# Patient Record
Sex: Male | Born: 1955
Health system: Southern US, Community
[De-identification: ages and names within clinical notes are randomized; demographics above are authoritative.]

## PROBLEM LIST (undated history)

## (undated) DIAGNOSIS — Z87828 Personal history of other (healed) physical injury and trauma: Secondary | ICD-10-CM

## (undated) DIAGNOSIS — E611 Iron deficiency: Secondary | ICD-10-CM

## (undated) DIAGNOSIS — Z9289 Personal history of other medical treatment: Secondary | ICD-10-CM

## (undated) DIAGNOSIS — D649 Anemia, unspecified: Secondary | ICD-10-CM

## (undated) DIAGNOSIS — H548 Legal blindness, as defined in USA: Secondary | ICD-10-CM

## (undated) DIAGNOSIS — I10 Essential (primary) hypertension: Secondary | ICD-10-CM

## (undated) DIAGNOSIS — Z86718 Personal history of other venous thrombosis and embolism: Secondary | ICD-10-CM

## (undated) DIAGNOSIS — M1990 Unspecified osteoarthritis, unspecified site: Secondary | ICD-10-CM

## (undated) DIAGNOSIS — H4032X0 Glaucoma secondary to eye trauma, left eye, stage unspecified: Secondary | ICD-10-CM

## (undated) DIAGNOSIS — Z87898 Personal history of other specified conditions: Secondary | ICD-10-CM

## (undated) DIAGNOSIS — Z973 Presence of spectacles and contact lenses: Secondary | ICD-10-CM

## (undated) DIAGNOSIS — Q211 Atrial septal defect: Secondary | ICD-10-CM

## (undated) DIAGNOSIS — Q2112 Patent foramen ovale: Secondary | ICD-10-CM

## (undated) HISTORY — PX: OTHER SURGICAL HISTORY: SHX169

## (undated) HISTORY — PX: JOINT REPLACEMENT: SHX530

## (undated) HISTORY — PX: SUPERFICIAL KERATECTOMY: SHX2459

## (undated) HISTORY — PX: TEE WITHOUT CARDIOVERSION: SHX5443

---

## 1996-03-07 HISTORY — PX: ARTHROSCOPIC REPAIR ACL: SUR80

## 1998-01-08 ENCOUNTER — Emergency Department (HOSPITAL_COMMUNITY): Admission: EM | Admit: 1998-01-08 | Discharge: 1998-01-08 | Payer: Self-pay | Admitting: Emergency Medicine

## 1999-06-08 ENCOUNTER — Encounter: Payer: Self-pay | Admitting: Orthopedic Surgery

## 1999-06-08 ENCOUNTER — Ambulatory Visit (HOSPITAL_COMMUNITY): Admission: RE | Admit: 1999-06-08 | Discharge: 1999-06-08 | Payer: Self-pay | Admitting: Orthopedic Surgery

## 1999-07-22 ENCOUNTER — Observation Stay (HOSPITAL_COMMUNITY): Admission: RE | Admit: 1999-07-22 | Discharge: 1999-07-23 | Payer: Self-pay | Admitting: Orthopedic Surgery

## 2001-03-25 ENCOUNTER — Emergency Department (HOSPITAL_COMMUNITY): Admission: EM | Admit: 2001-03-25 | Discharge: 2001-03-25 | Payer: Self-pay | Admitting: *Deleted

## 2001-03-26 ENCOUNTER — Emergency Department (HOSPITAL_COMMUNITY): Admission: EM | Admit: 2001-03-26 | Discharge: 2001-03-26 | Payer: Self-pay | Admitting: Emergency Medicine

## 2001-03-30 ENCOUNTER — Emergency Department (HOSPITAL_COMMUNITY): Admission: EM | Admit: 2001-03-30 | Discharge: 2001-03-30 | Payer: Self-pay | Admitting: Emergency Medicine

## 2001-08-18 ENCOUNTER — Encounter: Payer: Self-pay | Admitting: Orthopedic Surgery

## 2001-08-18 ENCOUNTER — Encounter: Payer: Self-pay | Admitting: Emergency Medicine

## 2001-08-19 ENCOUNTER — Inpatient Hospital Stay (HOSPITAL_COMMUNITY): Admission: RE | Admit: 2001-08-19 | Discharge: 2001-08-21 | Payer: Self-pay | Admitting: Ophthalmology

## 2001-08-20 ENCOUNTER — Encounter: Payer: Self-pay | Admitting: Ophthalmology

## 2001-09-04 ENCOUNTER — Emergency Department (HOSPITAL_COMMUNITY): Admission: EM | Admit: 2001-09-04 | Discharge: 2001-09-04 | Payer: Self-pay | Admitting: Emergency Medicine

## 2007-10-16 ENCOUNTER — Ambulatory Visit (HOSPITAL_COMMUNITY): Admission: RE | Admit: 2007-10-16 | Discharge: 2007-10-16 | Payer: Self-pay | Admitting: Gastroenterology

## 2007-10-24 ENCOUNTER — Ambulatory Visit (HOSPITAL_COMMUNITY): Admission: RE | Admit: 2007-10-24 | Discharge: 2007-10-24 | Payer: Self-pay | Admitting: Gastroenterology

## 2007-10-30 ENCOUNTER — Ambulatory Visit (HOSPITAL_COMMUNITY): Admission: RE | Admit: 2007-10-30 | Discharge: 2007-10-30 | Payer: Self-pay | Admitting: Gastroenterology

## 2009-01-22 ENCOUNTER — Ambulatory Visit (HOSPITAL_COMMUNITY): Admission: RE | Admit: 2009-01-22 | Discharge: 2009-01-22 | Payer: Self-pay | Admitting: Family Medicine

## 2009-04-30 ENCOUNTER — Telehealth (INDEPENDENT_AMBULATORY_CARE_PROVIDER_SITE_OTHER): Payer: Self-pay | Admitting: *Deleted

## 2009-04-30 ENCOUNTER — Encounter: Payer: Self-pay | Admitting: Internal Medicine

## 2009-05-11 ENCOUNTER — Ambulatory Visit: Payer: Self-pay | Admitting: Internal Medicine

## 2009-05-11 ENCOUNTER — Encounter: Payer: Self-pay | Admitting: Internal Medicine

## 2009-05-11 ENCOUNTER — Ambulatory Visit (HOSPITAL_COMMUNITY): Admission: RE | Admit: 2009-05-11 | Discharge: 2009-05-11 | Payer: Self-pay | Admitting: Internal Medicine

## 2010-01-29 ENCOUNTER — Emergency Department (HOSPITAL_COMMUNITY)
Admission: EM | Admit: 2010-01-29 | Discharge: 2010-01-29 | Payer: Self-pay | Source: Home / Self Care | Admitting: Family Medicine

## 2010-03-29 ENCOUNTER — Encounter: Payer: Self-pay | Admitting: Gastroenterology

## 2010-04-06 NOTE — Progress Notes (Signed)
Summary: TEE  Phone Note From Other Clinic   Caller: Referral Coordinator Summary of Call: Please schedule for TEE per Dr Anne Hahn. Notes to come around from Med [facility] Initial call taken by: Edman Circle,  April 30, 2009 4:12 PM  Follow-up for Phone Call        spoke w/pt he prefers to have done on Mon or Tue, will call tom and sch and pt back w/details Meredith Staggers, RN  April 30, 2009 5:25 PM  04/30/09-10am--called pt to schedule TEE--left mess to call back--n terbeck  returning call, please call [phone] or [phone], Migdalia Dk  May 01, 2009 9:51 AM   Additional Follow-up for Phone Call Additional follow up Details #1::        05/01/09 11:30 am--spoke with pt and gave him instructions for TEE sched for 05/11/09 at 11am--pt asked a few questions which i answered to his satisfaction, i also sent him instructions for procedure and pamplet about TEE--NT Additional Follow-up by: Ledon Snare, RN,  May 01, 2009 11:34 AM    7

## 2010-04-06 NOTE — Consult Note (Signed)
Summary: [facility] Referral Form  [facility] Referral Form   Imported By: Roderic Ovens 05/15/2009 16:18:49  _____________________________________________________________________  External Attachment:    Type:   Image     Comment:   External Document

## 2010-07-20 NOTE — Op Note (Signed)
NAME:  Juan David, Juan David               ACCOUNT NO.:  [id number]   MEDICAL RECORD NO.:  [id number]          PATIENT TYPE:  AMB   LOCATION:  ENDO                         FACILITY:  [facility]   PHYSICIAN:  Graylin Shiver, M.D.   DATE OF BIRTH:  Apr 12, 1955   DATE OF PROCEDURE:  10/16/2007  DATE OF DISCHARGE:                               OPERATIVE REPORT   INDICATIONS FOR PROCEDURE:  Heme-positive stool and weight loss.   Informed consent was obtained after explanation of the risks of  bleeding, infection, and perforation.   PREMEDICATION:  Fentanyl 100 mcg IV and Versed 10 mg IV.   PROCEDURE:  With the patient in the left lateral decubitus position, a  rectal exam was performed.  No masses were felt.  The Pentax colonoscope  was inserted into the rectum and advanced into the sigmoid colon.  The  sigmoid was extremely tortuous.  The scope was advanced around the  sigmoid and up into the descending colon, then around into the  transverse colon.  The colon was extremely tortuous and I think the  transverse colon was looping down into the lower abdomen area.  I was  unable to straighten the colon.  I moved the patient's position.  I  applied pressure to the abdomen, but could not advance the scope all the  way to the cecum.  No lesions were seen on this examination.  He  tolerated the procedure well without complications.   IMPRESSION:  Incomplete colonoscopy to transverse colon.   PLAN:  Air contrast barium enema to evaluate the rest of the colon.           ______________________________  Graylin Shiver, M.D.     SFG/MEDQ  D:  10/16/2007  T:  10/17/2007  Job:  [id number]

## 2010-07-23 NOTE — Discharge Summary (Signed)
[place]. [facility]  Patient:    Juan David, Juan David Visit Number: [id number] MRN: [id number]          Service Type: EMS Location: ED Attending Physician:  Osvaldo Human Dictated by:   Guadelupe Sabin, M.D. Admit Date:  09/04/2001 Discharge Date: 09/04/2001   CC:         Aura Camps, M.D.   Discharge Summary  DATE OF BIRTH: 02/18/1956  This was an emergency outpatient admission of this 55 year old black male admitted with a ruptured globe of the left eye.  HISTORY OF PRESENT ILLNESS: This patient while mowing the lawn was struck in the left eye with a metal fragment. The patient was seen in the emergency room at [facility] by Dr. Aura Camps and noted to have a corneal laceration, prolapsed uveal tissue, traumatic cataract and on CAT scan an intraocular presumed metallic foreign body.  The patient warranted emergency surgery and the patient was transferred to my care at the [facility]. This diagnosis was confirmed and arrangements were made for his initial emergency surgery.  HOSPITAL COURSE: The patient therefore was taken to the operating room where under the ocular microscope the penetrating injury with uveal prolapse was inspected.  There was a J shaped laceration of the cornea with prolapsed iris tissue.  The operative procedure consisted of sweeping the prolapsed material from the incision site and closing the corneal incision with multiple 10-0 nylon sutures.  It was anticipated that the patient would require further complicated vitrectomy surgery, but it was felt important to immediately close the globe to allow and to reposit the prolapse uveal tissue.  The patient was given intravenous and intraocular antibiotics and topical antibiotics.  By the following morning the patient was alert and had much less pain in the operated eye.  A CAT scan was repeated showing the rather large foreign body felt to  be on the retinal surface or penetrating into the retina but not through the eye. It was therefore elected to proceed with a second operation on August 20, 2001 consisting of a pars plana vitrectomy with lensectomy of the traumatic cataract which obscured the fundus visualization. The eye itself was filled with a white fibrinous material felt to be either inflammatory or infectious from the vitreous.  Extremely  poor visualization was encountered due to the corneal laceration which leaked slightly and the pupil which slowly enlarged after the traumatic cataract had been mostly removed.  The vitrectomy was carried down closer to the retinal surface but not onto the retinal surface and the foreign body could not be visualized.  The electromagnet was placed on the cornea and there was no movement of a foreign body.  It was felt that rather than cause further injury to the retina that the case should be closed and at a later day another attempt made to remove the foreign body.  The patient tolerated the second procedure and it was felt that the patient was feeling well and could be discharged home for 24 hours.  He was then to be transferred to the [facility] for further evaluation at that medical center and probable additional surgery.  The patient was given a printed list of discharge instructions on the care and use of the operated eye.  DISCHARGE OCULAR MEDICATIONS INCLUDED: 1. Pred Forte ophthalmic solution. 2. Scopolamine ophthalmic solution four times a day. 3. Antibiotic topical medication of Ocuflox ophthalmic solution one drop every  hour.  ACTIVITY:  The patient was told to limit his activity.  FOLLOW-UP: He was then referred to the [facility], Dr. Pauline Aus Slusher, head of the department of Ophthalmology.  Dr. Harmon Pier agreed to accept the patient for further evaluation and/or additional surgery.  The patient and his family had a clear understanding  of the grave prognosis of this severe penetrating injury of the left eye.  They anticipated that further surgical procedures would be necessary carrying a very guarded outlook.  DISCHARGE DIAGNOSIS: Ruptured glove, penetrating injury, left eye with uveal prolapse, traumatic cataract, intraocular metallic foreign body, corneal laceration. Dictated by:   Guadelupe Sabin, M.D. Attending Physician:  Osvaldo Human DD:  09/09/01 TD:  09/11/01 Job: [id number]

## 2010-07-23 NOTE — H&P (Signed)
[place]. [facility]  Patient:    Juan, David Visit Number: [id number] MRN: [id number]          Service Type: EMS Location: ED Attending Physician:  Donnetta Hutching Dictated by:   Guadelupe Sabin, M.D. Admit Date:  08/18/2001 Discharge Date: 08/18/2001   CC:         [name], M.D.  Aura Camps, M.D.   History and Physical  This was an emergency outpatient admission of this 55 year old black male admitted with a traumatic ruptured left eye.  HISTORY OF PRESENT ILLNESS:  This patient sustained a perforating injury of the left eye while mowing his lawn at home.  The patient was taken to the [facility] Emergency Room and was seen by a Dr. Aura Camps.  Dr. Karleen Hampshire noted a corneal laceration, prolapsed uveal tissue, traumatic cataract, and on CAT scan, an intraocular foreign body was seen.  It was felt that the patient warranted emergency surgery and the patient was transferred to the operating room at [facility].  This diagnosis was confirmed and arrangements made for his emergency surgery.  PAST MEDICAL HISTORY:  The patient is under the care of Dr. [name] in [place].  The patient has sustained an infection of his thumb and has been on Keflex antibiotic.  The thumb has cleared significantly over the past two weeks and there is no purulent discharge present.  REVIEW OF SYSTEMS:  The patient states he is in excellent general health.  He denies any specific cardiorespiratory complaints and/or cardiorespiratory disease.  PHYSICAL EXAMINATION:  VITAL SIGNS:  As recorded on admission.  GENERAL APPEARANCE:  The patient is a healthy, well-nourished, well-developed black male in acute ocular distress.  HEENT:  Eyes:  ?Light perception left eye.  There is edema of the left upper and lower lid with mucoid discharge.  The cornea is hazy and a J-shaped corneal laceration present, measuring 4-6 mm in length.  Prolapsed  uveal tissue is present and iris.  The central anterior chamber is deep on the temporal side, the laceration on the nasal side.  Traumatic opaque cataract is present.  No fundus view is obtained with indirect ophthalmoscopy.  CHEST:  Lungs clear to percussion and auscultation.  HEART:  Normal sinus rhythm.  No cardiomegaly, no murmurs.  ABDOMEN:  Negative.  EXTREMITIES:  There is a laceration and defect in the thumb of the left hand. No purulent drainage is present in the lesion.  The laceration-infection appears dry.  ADMISSION DIAGNOSES: 1. Perforating injury with prolapsed uveal tissue, left eye. 2. Probable intraocular foreign body, left eye.  SURGICAL PLAN:  Repair of corneal laceration and prolapsed uveal tissue.  The patient will require a more extensive vitrectomy with attempted removal of the intraocular foreign body as a second stage.  The patient and his family have been told the prognosis for retention of vision and possibly even retention of the eye is very serious.  Understanding the gravity of the situation, they have elected to proceed with surgery.  They understand that multiple surgical procedures will be required. Dictated by:   Guadelupe Sabin, M.D. Attending Physician:  Donnetta Hutching DD:  08/18/01 TD:  08/20/01 Job: [id number]

## 2010-07-23 NOTE — Op Note (Signed)
[place]  Patient:    Juan David, Juan David Visit Number: [id number] MRN: [id number]          Service Type: MED Location: [phone] Attending Physician:  Ivor Messier Dictated by:   Guadelupe Sabin, M.D. Proc. Date: 08/18/01 Admit Date:  08/18/2001 Discharge Date: 08/21/2001   CC:         [name], M.D.  Aura Camps, M.D.   Operative Report  PREOPERATIVE DIAGNOSES: 1. Ruptured globe. 2. Perforating injury, left eye, with prolapsed uveal tissue. 3. Traumatic cataract. 4. Probable intraocular foreign body.  POSTOPERATIVE DIAGNOSES: 1. Ruptured globe. 2. Perforating injury, left eye, with prolapsed uveal tissue. 3. Traumatic cataract. 4. Probable intraocular foreign body.  OPERATION:  Repair of corneal laceration, reposition of prolapsed uveal tissue, intraocular antibiotic installation.  SURGEON:  Guadelupe Sabin, M.D.  ASSISTANT:  Nurse.  ANESTHESIA:  General.  Ophthalmoscopy as previously described.  OPERATIVE PROCEDURE:  After the patient was prepped and draped, a lid speculum was inserted in the left eye.  The eye was turned downward and a conjunctival 6-0 silk suture placed.  The ocular microscope was aligned.  Examination of the eye revealed a J-shaped corneal laceration of the nasal cornea.  The central cornea was hazy and edematous.  There appeared to be prolapsed uveal tissue and a vitreous.  The laceration was irrigated and a conjunctival culture obtained.  An incision was made into the temporal cornea with a 19-gauge MVR blade.  Using the cyclodialysis spatula, the anterior chamber was swept after being deepened with air.  The prolapsed uveal tissue was reposited.  It was then elected to suture the corneal laceration.  Five 10-0 interrupted nylon sutures were used to close the laceration.  The anterior chamber shallowed intermittently and was reformed with air and epinephrine was injected to  dilate the pupil.  This was unsuccessful in dilating the pupil. The anterior chamber, however, deepened and the initial sweeping of the prolapsed uveal tissue was reposited and the air bubble extended to the complete limbus.  This indicated that there was no incarceration of uveal or vitreous tissue centrally.  It was then elected to close.  The incision was inspected and felt to be closed and fluid and air tight.  A small incision was then made in the conjunctiva at the 2 oclock position and extended backward 3 mm.  It was elected to inject amikacin, vancomycin, and dexamethasone in the vitreous cavity in dosage consistent with the national endophthalmitis study. One tenth cc of each of the medications was injected with a 30 gauge needle into the anterior vitreous.  The conjunctival incision was then closed with a single 7-0 Vicryl suture.  The subconjunctival injections of vancomycin and Garamycin were injected.  Maxitrol and atropine ointment were instilled in the conjunctival cul-de-sac.  A light patch and protector shield were applied. Duration of procedure:  1-1/4 hours.  The patient tolerated the procedure well in general, left the operating room for the recovery room in good condition.  It appears that this patient will require subsequent vitreal retinal surgery to remove the possible intraocular foreign body.  The cornea was too hazy at this point in time to do vitrectomy surgery.  It was thus elected to proceed with closure of the primary corneal incision and reposition of the uveal tissue. Dictated by:   Guadelupe Sabin, M.D. Attending Physician:  Ivor Messier DD:  08/18/01 TD:  08/20/01 Job: [id number]

## 2013-09-20 ENCOUNTER — Other Ambulatory Visit: Payer: Self-pay | Admitting: Radiology

## 2013-10-28 ENCOUNTER — Other Ambulatory Visit: Payer: Self-pay | Admitting: *Deleted

## 2014-11-24 ENCOUNTER — Emergency Department (HOSPITAL_COMMUNITY)
Admission: EM | Admit: 2014-11-24 | Discharge: 2014-11-24 | Disposition: A | Discharge status: Home / Self Care | Payer: 59 | Attending: Emergency Medicine | Admitting: Emergency Medicine

## 2014-11-24 ENCOUNTER — Emergency Department (HOSPITAL_COMMUNITY): Payer: 59

## 2014-11-24 ENCOUNTER — Encounter (HOSPITAL_COMMUNITY): Payer: Self-pay | Admitting: Emergency Medicine

## 2014-11-24 DIAGNOSIS — S99911A Unspecified injury of right ankle, initial encounter: Secondary | ICD-10-CM | POA: Diagnosis present

## 2014-11-24 DIAGNOSIS — Y998 Other external cause status: Secondary | ICD-10-CM | POA: Insufficient documentation

## 2014-11-24 DIAGNOSIS — M25571 Pain in right ankle and joints of right foot: Secondary | ICD-10-CM

## 2014-11-24 DIAGNOSIS — Z720 Tobacco use: Secondary | ICD-10-CM | POA: Insufficient documentation

## 2014-11-24 DIAGNOSIS — W1842XA Slipping, tripping and stumbling without falling due to stepping into hole or opening, initial encounter: Secondary | ICD-10-CM | POA: Insufficient documentation

## 2014-11-24 DIAGNOSIS — Z79899 Other long term (current) drug therapy: Secondary | ICD-10-CM | POA: Insufficient documentation

## 2014-11-24 DIAGNOSIS — Z7982 Long term (current) use of aspirin: Secondary | ICD-10-CM | POA: Insufficient documentation

## 2014-11-24 DIAGNOSIS — Y9389 Activity, other specified: Secondary | ICD-10-CM | POA: Diagnosis not present

## 2014-11-24 DIAGNOSIS — Y9289 Other specified places as the place of occurrence of the external cause: Secondary | ICD-10-CM | POA: Insufficient documentation

## 2014-11-24 DIAGNOSIS — S93601A Unspecified sprain of right foot, initial encounter: Secondary | ICD-10-CM | POA: Insufficient documentation

## 2014-11-24 MED ORDER — NAPROXEN 250 MG PO TABS: 250.0000 mg | ORAL_TABLET | Freq: Two times a day (BID) | ORAL | Status: DC

## 2014-11-24 NOTE — ED Notes (Signed)
Pt c/o right lateral foot pain onset Saturday after twisting ankle in a hole.

## 2014-11-24 NOTE — ED Provider Notes (Signed)
CSN: [id number]     Arrival date & time 11/24/14  1415 History  This chart was scribed for non-physician practitioner, Waynetta Pean, PA-C working with Leonard Schwartz, MD by Rayna Sexton, ED scribe. This patient was seen in room WTR5/WTR5 and the patient's care was started at 5:47 PM.   Chief Complaint  Patient presents with  . Ankle Pain   The history is provided by the patient. No language interpreter was used.    HPI Comments: Juan David is a 59 y.o. male who presents to the Emergency Department complaining of constant, mild, right lateral foot pain with onset 2 days ago. Pt notes that he stepped into a hole and twisted the affected foot resulting in his current pain. He notes a worsening of his pain with ambulation and rates that pain as 7/10. He reports he has no pain without walking. He denies any radiation of his pain or any associated ankle pain. Pt denies any numbness, weakness, tingling, fevers, chills or any recent illnesses.   History reviewed. No pertinent past medical history. Past Surgical History  Procedure Laterality Date  . Eye surgery    . Arthroscopic repair acl     No family history on file. Social History  Substance Use Topics  . Smoking status: Current Every Day Smoker    Types: Cigars  . Smokeless tobacco: None  . Alcohol Use: Yes     Comment: occasionally    Review of Systems  Constitutional: Negative for fever and chills.  Musculoskeletal: Positive for myalgias and joint swelling.  Skin: Negative for wound.  Neurological: Negative for weakness and numbness.   Allergies  Review of patient's allergies indicates no known allergies.  Home Medications   Prior to Admission medications   Medication Sig Start Date End Date Taking? Authorizing Provider  aspirin 325 MG tablet Take 325 mg by mouth daily.   Yes Historical Provider, MD  hydrochlorothiazide (HYDRODIURIL) 50 MG tablet Take 25 mg by mouth daily.  10/27/14  Yes Historical Provider, MD  IRON  PO Take 4 tablets by mouth daily.   Yes Historical Provider, MD  naproxen (NAPROSYN) 250 MG tablet Take 1 tablet (250 mg total) by mouth 2 (two) times daily with a meal. 11/24/14   Waynetta Pean, PA-C  timolol (TIMOPTIC) 0.5 % ophthalmic solution Place 1 drop into the left eye daily.  11/19/14  Yes Historical Provider, MD   BP 163/96 mmHg  Pulse 72  Temp(Src) 97.9 F (36.6 C) (Oral)  Resp 18  SpO2 100% Physical Exam  Constitutional: He appears well-developed and well-nourished. No distress.  Non-toxic appearing  HENT:  Head: Normocephalic and atraumatic.  Eyes: Conjunctivae are normal. Pupils are equal, round, and reactive to light. Right eye exhibits no discharge. Left eye exhibits no discharge.  Neck: Neck supple.  Cardiovascular: Normal rate, regular rhythm, normal heart sounds and intact distal pulses.   Pulses:      Dorsalis pedis pulses are 2+ on the right side, and 2+ on the left side.       Posterior tibial pulses are 2+ on the right side, and 2+ on the left side.  Good capillary refill to his right distal toes.  Pulmonary/Chest: Effort normal and breath sounds normal. No respiratory distress.  Abdominal: Soft. There is no tenderness.  Musculoskeletal: Normal range of motion. He exhibits edema and tenderness.  Mild edema to lateral aspect of right foot. No deformity. No right ankle TTP. No ecchymosis. Good range of motion of his right ankle  and foot.  No calf edema or tenderness.  Lymphadenopathy:    He has no cervical adenopathy.  Neurological: He is alert. Coordination normal.  Sensation is intact to his bilateral feet.  Skin: Skin is warm and dry. No rash noted. He is not diaphoretic. No erythema. No pallor.  Psychiatric: He has a normal mood and affect. His behavior is normal.  Nursing note and vitals reviewed.  ED Course  Procedures  DIAGNOSTIC STUDIES: Oxygen Saturation is 100% on RA, normal by my interpretation.    COORDINATION OF CARE: 5:52 PM Discussed  treatment plan with pt at bedside including a post op shoe and at home recommendations for rehabilitation. Pt agreed to plan.  Labs Review Labs Reviewed - No data to display  Imaging Review Dg Ankle Complete Right  11/24/2014   CLINICAL DATA:  Pt states he was at his sisters house Saturday and stepped in a hole causing him to roll his right ankle, pain  EXAM: RIGHT ANKLE - COMPLETE 3+ VIEW  COMPARISON:  None.  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 Mortise is intact. No fracture identified. Small joint effusion. Minimal soft tissue swelling. Mild tibiotalar arthritis.  IMPRESSION: Degenerative changes and evidence of ankle sprain.   Electronically Signed   By: Skipper Cliche M.D.   On: 11/24/2014 15:06   Dg Foot Complete Right  11/24/2014   CLINICAL DATA:  Stepped in hole and rolled right ankle with pain and swelling laterally.  EXAM: RIGHT FOOT COMPLETE - 3+ VIEW  COMPARISON:  None.  FINDINGS: Mild degenerative change over the first MTP joint. Mild age in change of the tibiotalar joint. No acute fracture or dislocation.  IMPRESSION: No acute findings.   Electronically Signed   By: Marin Olp M.D.   On: 11/24/2014 15:07     EKG Interpretation None      Filed Vitals:   11/24/14 1445 11/24/14 1815  BP: 156/88 163/96  Pulse: 82 72  Temp: 98.2 F (36.8 C) 97.9 F (36.6 C)  TempSrc: Oral Oral  Resp: 18 18  SpO2: 100% 100%     MDM   Meds given in ED:  Medications - No data to display  Discharge Medication List as of 11/24/2014  5:54 PM    START taking these medications   Details  naproxen (NAPROSYN) 250 MG tablet Take 1 tablet (250 mg total) by mouth 2 (two) times daily with a meal., Starting 11/24/2014, Until Discontinued, Print        Final diagnoses:  Foot sprain, right, initial encounter  Ankle pain, right   This is a 59 year old male who presents to the emergency department complaining of right foot pain after he stepped into a  hole 2 days ago. He reports this caused him to roll his ankle. He denies any ankle pain to me. On exam the patient has mild edema and tenderness to the lateral aspect of his right foot. No deformity noted. He is neurovascularly intact. Right foot x-rays unremarkable. Right ankle x-ray shows degenerative changes and evidence of ankle sprain. The patient has no ankle tenderness on exam and complains of no ankle pain to me. He reports he uses an Ace wrap previously and this did not help. No clinical evidence of ankle sprain on physical exam. Will place the patient in a postop shoe due to his foot pain and have him follow-up with primary care and orthopedic surgery. I advised him if his pain persists he may need repeat  x-rays or additional imaging of his feet. I advised the patient to follow-up with their primary care provider this week. I advised the patient to return to the emergency department with new or worsening symptoms or new concerns. The patient verbalized understanding and agreement with plan.    I personally performed the services described in this documentation, which was scribed in my presence. The recorded information has been reviewed and is accurate.      Waynetta Pean, PA-C 11/24/14 1924  Leonard Schwartz, MD 11/24/14 [phone]

## 2014-11-24 NOTE — Discharge Instructions (Signed)
Foot Sprain The muscles and cord like structures which attach muscle to bone (tendons) that surround the feet are made up of units. A foot sprain can occur at the weakest spot in any of these units. This condition is most often caused by injury to or overuse of the foot, as from playing contact sports, or aggravating a previous injury, or from poor conditioning, or obesity. SYMPTOMS  Pain with movement of the foot.  Tenderness and swelling at the injury site.  Loss of strength is present in moderate or severe sprains. THE THREE GRADES OR SEVERITY OF FOOT SPRAIN ARE:  Mild (Grade I): Slightly pulled muscle without tearing of muscle or tendon fibers or loss of strength.  Moderate (Grade II): Tearing of fibers in a muscle, tendon, or at the attachment to bone, with small decrease in strength.  Severe (Grade III): Rupture of the muscle-tendon-bone attachment, with separation of fibers. Severe sprain requires surgical repair. Often repeating (chronic) sprains are caused by overuse. Sudden (acute) sprains are caused by direct injury or over-use. DIAGNOSIS  Diagnosis of this condition is usually by your own observation. If problems continue, a caregiver may be required for further evaluation and treatment. X-rays may be required to make sure there are not breaks in the bones (fractures) present. Continued problems may require physical therapy for treatment. PREVENTION  Use strength and conditioning exercises appropriate for your sport.  Warm up properly prior to working out.  Use athletic shoes that are made for the sport you are participating in.  Allow adequate time for healing. Early return to activities makes repeat injury more likely, and can lead to an unstable arthritic foot that can result in prolonged disability. Mild sprains generally heal in 3 to 10 days, with moderate and severe sprains taking 2 to 10 weeks. Your caregiver can help you determine the proper time required for  healing. HOME CARE INSTRUCTIONS   Apply ice to the injury for 15-20 minutes, 03-04 times per day. Put the ice in a plastic bag and place a towel between the bag of ice and your skin.  An elastic wrap (like an Ace bandage) may be used to keep swelling down.  Keep foot above the level of the heart, or at least raised on a footstool, when swelling and pain are present.  Try to avoid use other than gentle range of motion while the foot is painful. Do not resume use until instructed by your caregiver. Then begin use gradually, not increasing use to the point of pain. If pain does develop, decrease use and continue the above measures, gradually increasing activities that do not cause discomfort, until you gradually achieve normal use.  Use crutches if and as instructed, and for the length of time instructed.  Keep injured foot and ankle wrapped between treatments.  Massage foot and ankle for comfort and to keep swelling down. Massage from the toes up towards the knee.  Only take over-the-counter or prescription medicines for pain, discomfort, or fever as directed by your caregiver. SEEK IMMEDIATE MEDICAL CARE IF:   Your pain and swelling increase, or pain is not controlled with medications.  You have loss of feeling in your foot or your foot turns cold or blue.  You develop new, unexplained symptoms, or an increase of the symptoms that brought you to your caregiver. MAKE SURE YOU:   Understand these instructions.  Will watch your condition.  Will get help right away if you are not doing well or get worse. Document Released:  08/13/2001 Document Revised: 05/16/2011 Document Reviewed: 10/11/2007 ExitCare Patient Information 2015 [place], [place]. This information is not intended to replace advice given to you by your health care provider. Make sure you discuss any questions you have with your health care provider.  Ankle Pain Ankle pain is a common symptom. The bones, cartilage, tendons,  and muscles of the ankle joint perform a lot of work each day. The ankle joint holds your body weight and allows you to move around. Ankle pain can occur on either side or back of 1 or both ankles. Ankle pain may be sharp and burning or dull and aching. There may be tenderness, stiffness, redness, or warmth around the ankle. The pain occurs more often when a person walks or puts pressure on the ankle. CAUSES  There are many reasons ankle pain can develop. It is important to work with your caregiver to identify the cause since many conditions can impact the bones, cartilage, muscles, and tendons. Causes for ankle pain include:  Injury, including a break (fracture), sprain, or strain often due to a fall, sports, or a high-impact activity.  Swelling (inflammation) of a tendon (tendonitis).  Achilles tendon rupture.  Ankle instability after repeated sprains and strains.  Poor foot alignment.  Pressure on a nerve (tarsal tunnel syndrome).  Arthritis in the ankle or the lining of the ankle.  Crystal formation in the ankle (gout or pseudogout). DIAGNOSIS  A diagnosis is based on your medical history, your symptoms, results of your physical exam, and results of diagnostic tests. Diagnostic tests may include X-ray exams or a computerized magnetic scan (magnetic resonance imaging, MRI). TREATMENT  Treatment will depend on the cause of your ankle pain and may include:  Keeping pressure off the ankle and limiting activities.  Using crutches or other walking support (a cane or brace).  Using rest, ice, compression, and elevation.  Participating in physical therapy or home exercises.  Wearing shoe inserts or special shoes.  Losing weight.  Taking medications to reduce pain or swelling or receiving an injection.  Undergoing surgery. HOME CARE INSTRUCTIONS   Only take over-the-counter or prescription medicines for pain, discomfort, or fever as directed by your caregiver.  Put ice on the  injured area.  Put ice in a plastic bag.  Place a towel between your skin and the bag.  Leave the ice on for 15-20 minutes at a time, 03-04 times a day.  Keep your leg raised (elevated) when possible to lessen swelling.  Avoid activities that cause ankle pain.  Follow specific exercises as directed by your caregiver.  Record how often you have ankle pain, the location of the pain, and what it feels like. This information may be helpful to you and your caregiver.  Ask your caregiver about returning to work or sports and whether you should drive.  Follow up with your caregiver for further examination, therapy, or testing as directed. SEEK MEDICAL CARE IF:   Pain or swelling continues or worsens beyond 1 week.  You have an oral temperature above 102 F (38.9 C).  You are feeling unwell or have chills.  You are having an increasingly difficult time with walking.  You have loss of sensation or other new symptoms.  You have questions or concerns. MAKE SURE YOU:   Understand these instructions.  Will watch your condition.  Will get help right away if you are not doing well or get worse. Document Released: 08/11/2009 Document Revised: 05/16/2011 Document Reviewed: 08/11/2009 ExitCare Patient Information 2015 [place]. This information is not intended to replace advice given to you by your health care provider. Make sure you discuss any questions you have with your health care provider.

## 2014-11-24 NOTE — ED Notes (Signed)
Post op shoe present to rt foot

## 2015-05-04 DIAGNOSIS — Z049 Encounter for examination and observation for unspecified reason: Secondary | ICD-10-CM | POA: Diagnosis not present

## 2015-05-04 DIAGNOSIS — I10 Essential (primary) hypertension: Secondary | ICD-10-CM | POA: Diagnosis not present

## 2015-05-04 DIAGNOSIS — Z0000 Encounter for general adult medical examination without abnormal findings: Secondary | ICD-10-CM | POA: Diagnosis not present

## 2015-05-04 DIAGNOSIS — R7301 Impaired fasting glucose: Secondary | ICD-10-CM | POA: Diagnosis not present

## 2015-05-04 DIAGNOSIS — E0590 Thyrotoxicosis, unspecified without thyrotoxic crisis or storm: Secondary | ICD-10-CM | POA: Diagnosis not present

## 2015-05-08 MED FILL — LOVASTATIN 20 MG TABLET: 20 | 90 days supply | Qty: 90 | Fill #0

## 2015-05-08 MED FILL — HYDROCHLOROTHIAZIDE 50 MG T: 50 | 90 days supply | Qty: 90 | Fill #0

## 2015-08-06 DIAGNOSIS — H18422 Band keratopathy, left eye: Secondary | ICD-10-CM | POA: Diagnosis not present

## 2015-08-06 DIAGNOSIS — S0532XS Ocular laceration without prolapse or loss of intraocular tissue, left eye, sequela: Secondary | ICD-10-CM | POA: Diagnosis not present

## 2015-08-06 DIAGNOSIS — H4032X4 Glaucoma secondary to eye trauma, left eye, indeterminate stage: Secondary | ICD-10-CM | POA: Diagnosis not present

## 2015-08-06 DIAGNOSIS — H33052 Total retinal detachment, left eye: Secondary | ICD-10-CM | POA: Diagnosis not present

## 2015-08-20 ENCOUNTER — Emergency Department (HOSPITAL_COMMUNITY)
Admission: EM | Admit: 2015-08-20 | Discharge: 2015-08-20 | Disposition: A | Discharge status: Home / Self Care | Payer: 59 | Attending: Emergency Medicine | Admitting: Emergency Medicine

## 2015-08-20 ENCOUNTER — Encounter (HOSPITAL_COMMUNITY): Payer: Self-pay | Admitting: Emergency Medicine

## 2015-08-20 ENCOUNTER — Emergency Department (HOSPITAL_COMMUNITY): Payer: 59

## 2015-08-20 DIAGNOSIS — F17210 Nicotine dependence, cigarettes, uncomplicated: Secondary | ICD-10-CM | POA: Diagnosis not present

## 2015-08-20 DIAGNOSIS — Z79899 Other long term (current) drug therapy: Secondary | ICD-10-CM | POA: Insufficient documentation

## 2015-08-20 DIAGNOSIS — Z7982 Long term (current) use of aspirin: Secondary | ICD-10-CM | POA: Diagnosis not present

## 2015-08-20 DIAGNOSIS — Z791 Long term (current) use of non-steroidal anti-inflammatories (NSAID): Secondary | ICD-10-CM | POA: Diagnosis not present

## 2015-08-20 DIAGNOSIS — M25562 Pain in left knee: Secondary | ICD-10-CM | POA: Insufficient documentation

## 2015-08-20 HISTORY — DX: Essential (primary) hypertension: I10

## 2015-08-20 MED ORDER — NAPROXEN 500 MG PO TABS: 500.0000 mg | ORAL_TABLET | Freq: Two times a day (BID) | ORAL | Status: DC

## 2015-08-20 MED FILL — NAPROXEN 500 MG TABLET: 500 | 15 days supply | Qty: 30 | Fill #0

## 2015-08-20 NOTE — ED Notes (Signed)
C/o left knee pain and swelling x 2 days. No known injury. Tender to medial aspect of left knee.

## 2015-08-20 NOTE — ED Notes (Signed)
Gone to xray,.

## 2015-08-20 NOTE — ED Provider Notes (Signed)
CSN: [id number]     Arrival date & time 08/20/15  1141 History  By signing my name below, I, Juan David, attest that this documentation has been prepared under the direction and in the presence of Hyman Bible, PA-C Electronically Signed: Ladene Artist, ED Scribe 08/20/2015 at 1:11 PM.   Chief Complaint  Patient presents with  . Knee Pain   The history is provided by the patient. No language interpreter was used.   HPI Comments: Juan David is a 60 y.o. male who presents to the Emergency Department complaining of gradually worsening left knee pain onset 2 days ago. Pt denies fall or injury. He reports increased pain with flexion and instability with ambulating up stairs. Pt reports associated symptoms of joint swelling and warmth last night. He has tried ibuprofen with minimal relief. Pt denies redness and any other symptoms.   No past medical history on file. Past Surgical History  Procedure Laterality Date  . Eye surgery    . Arthroscopic repair acl     No family history on file. Social History  Substance Use Topics  . Smoking status: Current Every Day Smoker    Types: Cigars  . Smokeless tobacco: Not on file  . Alcohol Use: Yes     Comment: occasionally    Review of Systems  Musculoskeletal: Positive for joint swelling and arthralgias.  Skin: Negative for color change.  All other systems reviewed and are negative.  Allergies  Review of patient's allergies indicates no known allergies.  Home Medications   Prior to Admission medications   Medication Sig Start Date End Date Taking? Authorizing Provider  aspirin 325 MG tablet Take 325 mg by mouth daily.    Historical Provider, MD  hydrochlorothiazide (HYDRODIURIL) 50 MG tablet Take 25 mg by mouth daily.  10/27/14   Historical Provider, MD  IRON PO Take 4 tablets by mouth daily.    Historical Provider, MD  naproxen (NAPROSYN) 250 MG tablet Take 1 tablet (250 mg total) by mouth 2 (two) times daily with a meal.  11/24/14   Waynetta Pean, PA-C  timolol (TIMOPTIC) 0.5 % ophthalmic solution Place 1 drop into the left eye daily.  11/19/14   Historical Provider, MD   BP 133/80 mmHg  Pulse 68  Temp(Src) 98.3 F (36.8 C) (Oral)  Resp 18  Ht 5' 10.75" (1.797 m)  Wt 145 lb (65.772 kg)  BMI 20.37 kg/m2  SpO2 100% Physical Exam  Constitutional: He is oriented to person, place, and time. He appears well-developed and well-nourished. No distress.  HENT:  Head: Normocephalic and atraumatic.  Eyes: Conjunctivae and EOM are normal.  Neck: Neck supple. No tracheal deviation present.  Cardiovascular: Normal rate and regular rhythm.   Pulmonary/Chest: Effort normal and breath sounds normal. No respiratory distress.  Musculoskeletal: Normal range of motion.  Tenderness to palpation of the medial aspect of the L knee with some associated edema No erythema or warmth Good ROM of left knee No obvious laxity with anterior or posterior drawer  Pain with valgus stress of the knee No pain with varus stress of the knee No tenderness to palpation of the L calf No swelling or erythema of L calf   Neurological: He is alert and oriented to person, place, and time.  Skin: Skin is warm and dry.  Psychiatric: He has a normal mood and affect. His behavior is normal.  Nursing note and vitals reviewed.  ED Course  Procedures (including critical care time) DIAGNOSTIC STUDIES: Oxygen Saturation is  100% on RA, normal by my interpretation.    COORDINATION OF CARE: 12:00 PM-Discussed treatment plan which includes XR with pt at bedside and pt agreed to plan.   Labs Review Labs Reviewed - No data to display  Imaging Review Dg Knee Complete 4 Views Left  08/20/2015  CLINICAL DATA:  Medial left knee pain. EXAM: LEFT KNEE - COMPLETE 4+ VIEW COMPARISON:  01/29/2010 FINDINGS: No evidence of fracture, dislocation, or joint effusion. There are mild osteoarthritic changes of all 3 compartments of the knee joint with tibial spine  spurring, and patellar spurring. There is minimal associated joint space narrowing and subchondral sclerosis of the medial and lateral compartments. Prominent vascular calcifications are noted within the soft tissues. IMPRESSION: No acute fracture or dislocation identified about the left knee. 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 Vascular calcifications. Electronically Signed   By: Fidela Salisbury M.D.   On: 08/20/2015 13:06   I have personally reviewed and evaluated these images and lab results as part of my medical decision-making.   EKG Interpretation None      MDM   Final diagnoses:  None  Patient presents today with left knee pain.  No acute injury or trauma.  No signs of infection on exam.  Xray showing mild OA of all three compartments.  No obvious laxity with anterior or posterior drawer.  Pain with valgus stress of the knee.  Patient ambulating without difficulty.  Patient given knee sleeve and referral to orthopedics.  Stable for discharge.  Return precautions given.    I personally performed the services described in this documentation, which was scribed in my presence. The recorded information has been reviewed and is accurate.    Hyman Bible, PA-C 08/20/15 [place] Liu, MD 08/21/15 [phone]

## 2015-11-02 MED FILL — HYDROCHLOROTHIAZIDE 50 MG T: 50 | 90 days supply | Qty: 90 | Fill #0

## 2015-11-06 MED FILL — TIMOLOL 0.5% EYE DROPS: 0.5 | 90 days supply | Qty: 5 | Fill #0

## 2016-05-06 DIAGNOSIS — Z049 Encounter for examination and observation for unspecified reason: Secondary | ICD-10-CM | POA: Diagnosis not present

## 2016-05-06 DIAGNOSIS — Z1211 Encounter for screening for malignant neoplasm of colon: Secondary | ICD-10-CM | POA: Diagnosis not present

## 2016-05-06 DIAGNOSIS — I10 Essential (primary) hypertension: Secondary | ICD-10-CM | POA: Diagnosis not present

## 2016-05-06 DIAGNOSIS — Z0000 Encounter for general adult medical examination without abnormal findings: Secondary | ICD-10-CM | POA: Diagnosis not present

## 2016-05-06 MED FILL — HYDROCHLOROTHIAZIDE 50 MG T: 50 | 90 days supply | Qty: 45 | Fill #0

## 2016-05-23 DIAGNOSIS — H4032X4 Glaucoma secondary to eye trauma, left eye, indeterminate stage: Secondary | ICD-10-CM | POA: Diagnosis not present

## 2016-05-23 DIAGNOSIS — H18422 Band keratopathy, left eye: Secondary | ICD-10-CM | POA: Diagnosis not present

## 2016-05-23 DIAGNOSIS — S0532XS Ocular laceration without prolapse or loss of intraocular tissue, left eye, sequela: Secondary | ICD-10-CM | POA: Diagnosis not present

## 2016-05-23 MED FILL — PREDNISOLONE AC 1% EYE DROP: 1 | 25 days supply | Qty: 5 | Fill #0

## 2016-05-23 MED FILL — TIMOLOL 0.5% EYE DROPS: 0.5 | 90 days supply | Qty: 5 | Fill #0

## 2016-07-26 ENCOUNTER — Encounter (HOSPITAL_COMMUNITY): Payer: Self-pay | Admitting: *Deleted

## 2016-07-26 ENCOUNTER — Ambulatory Visit (HOSPITAL_COMMUNITY)
Admission: EM | Admit: 2016-07-26 | Discharge: 2016-07-26 | Disposition: A | Discharge status: Home / Self Care | Payer: 59 | Attending: Family Medicine | Admitting: Family Medicine

## 2016-07-26 DIAGNOSIS — K629 Disease of anus and rectum, unspecified: Secondary | ICD-10-CM | POA: Diagnosis not present

## 2016-07-26 DIAGNOSIS — K6289 Other specified diseases of anus and rectum: Secondary | ICD-10-CM

## 2016-07-26 MED ORDER — DOCUSATE SODIUM 100 MG PO CAPS: 100.0000 mg | ORAL_CAPSULE | Freq: Two times a day (BID) | ORAL | 0 refills | Status: DC

## 2016-07-26 NOTE — ED Notes (Signed)
Juan David  From [facility]  Notified     And  Spoke    With  Pt     To  Get      An  appt  With  Pt  asap

## 2016-07-26 NOTE — ED Triage Notes (Signed)
Pt   Reports   Has     Had  Boil   Buttock     X  sev  Months   States  It  Bursts  yest   And  Has  Been    Draining  Since

## 2016-07-26 NOTE — ED Provider Notes (Signed)
CSN: [id number]     Arrival date & time 07/26/16  1314 History   None    Chief Complaint  Patient presents with  . Abscess   (Consider location/radiation/quality/duration/timing/severity/associated sxs/prior Treatment) Patient c/o rectal mass that is draining.  He states this has been present for a month and it started draining yesterday.   The history is provided by the patient.  Abscess  Abscess location: rectum. Size:  1 cm  Abscess quality: redness and weeping   Red streaking: no   Progression:  Worsening Chronicity:  New Relieved by:  Nothing Worsened by:  Nothing Ineffective treatments:  None tried   Past Medical History:  Diagnosis Date  . Glaucoma   . Hypertension    Past Surgical History:  Procedure Laterality Date  . ARTHROSCOPIC REPAIR ACL    . EYE SURGERY     History reviewed. No pertinent family history. Social History  Substance Use Topics  . Smoking status: Current Every Day Smoker    Types: Cigars  . Smokeless tobacco: Not on file  . Alcohol use Yes     Comment: occasionally    Review of Systems  Constitutional: Negative.   HENT: Negative.   Eyes: Negative.   Respiratory: Negative.   Cardiovascular: Negative.   Gastrointestinal: Positive for rectal pain.  Endocrine: Negative.   Genitourinary: Negative.   Musculoskeletal: Negative.   Allergic/Immunologic: Negative.   Neurological: Negative.   Hematological: Negative.   Psychiatric/Behavioral: Negative.     Allergies  Patient has no known allergies.  Home Medications   Prior to Admission medications   Medication Sig Start Date End Date Taking? Authorizing Provider  POTASSIUM CHLORIDE PO Take by mouth.   Yes [provider]  aspirin 325 MG tablet Take 325 mg by mouth daily.    [provider]  docusate sodium (COLACE) 100 MG capsule Take 1 capsule (100 mg total) by mouth every 12 (twelve) hours. 07/26/16   Lysbeth Penner, FNP  hydrochlorothiazide (HYDRODIURIL) 50  MG tablet Take 25 mg by mouth daily.  10/27/14   [provider]  IRON PO Take 4 tablets by mouth daily.    [provider]  naproxen (NAPROSYN) 500 MG tablet Take 1 tablet (500 mg total) by mouth 2 (two) times daily. 08/20/15   Hyman Bible, PA-C  timolol (TIMOPTIC) 0.5 % ophthalmic solution Place 1 drop into the left eye daily.  11/19/14   [provider]   Meds Ordered and Administered this Visit  Medications - No data to display  BP 130/79 (BP Location: Right Arm)   Pulse 74   Temp 99 F (37.2 C) (Oral)   Resp 16   SpO2 95%  No data found.   Physical Exam  Constitutional: He appears well-developed and well-nourished.  HENT:  Head: Normocephalic and atraumatic.  Eyes: Conjunctivae and EOM are normal. Pupils are equal, round, and reactive to light.  Neck: Normal range of motion. Neck supple.  Cardiovascular: Normal rate, regular rhythm and normal heart sounds.   Pulmonary/Chest: Effort normal and breath sounds normal.  Abdominal: Soft. Bowel sounds are normal.  Genitourinary:  Genitourinary Comments: Fungating Rectal mass approx 1 cm diameter with bleeding and with friable bleeding after rectal exam.  Nursing note and vitals reviewed.   Urgent Care Course     Procedures (including critical care time)  Labs Review Labs Reviewed - No data to display  Imaging Review No results found.   Visual Acuity Review  Right Eye Distance:   Left  Eye Distance:   Bilateral Distance:    Right Eye Near:   Left Eye Near:    Bilateral Near:         MDM   1. Rectal mass    Refer to [facility] office and they will get patient in with them in next couple days.  Colace 100mg  one po bid #60      Lysbeth Penner, [place] 07/26/16 1615

## 2016-07-26 NOTE — Discharge Instructions (Signed)
Called and spoke to on call surgeon who recommends going to outpatient office for work up and treatment.   [facility] and they will call with an appointment.

## 2016-08-01 MED FILL — HYDROCHLOROTHIAZIDE 50 MG T: 50 | 90 days supply | Qty: 45 | Fill #1

## 2016-08-02 ENCOUNTER — Other Ambulatory Visit: Payer: Self-pay | Admitting: General Surgery

## 2016-08-02 DIAGNOSIS — K642 Third degree hemorrhoids: Secondary | ICD-10-CM | POA: Diagnosis not present

## 2016-08-02 NOTE — H&P (Signed)
History of Present Illness Leighton Ruff MD; 09/07/5007 2:33 PM) The patient is a 61 year old male who presents with a complaint of Rectal bleeding. 61 year old male who presents today for evaluation of rectal bleeding. He has noticed a mass protruding from his anal canal over the past 7 months. He has to reduce it back into his anal canal or it will bleed. A couple weeks ago he developed acute rectal bleeding and was seen in the emergency department. A mass was palpated on physical exam and he was sent to me for further evaluation. He denies any history of sex a transmitted diseases or anal condyloma.   Past Surgical History Mammie Lorenzo, LPN; 3/81/8299 3:71 PM) Cataract Surgery Left. Knee Surgery Right.  Diagnostic Studies History Mammie Lorenzo, LPN; 6/96/7893 8:10 PM) Colonoscopy 5-10 years ago  Allergies Mammie Lorenzo, LPN; 1/75/1025 8:52 PM) No Known Drug Allergies 08/02/2016 Allergies Reconciled  Medication History Mammie Lorenzo, LPN; 7/78/2423 5:36 PM) Timolol Maleate (0.5% Solution, Ophthalmic) Active. HydroCHLOROthiazide (50MG  Tablet, Oral) Active. Potassium Chloride (10MEQ Tablet ER, Oral) Active. Aspirin (325MG  Tablet DR, Oral) Active. Iron (Ferrous Gluconate) (325MG  Tablet, Oral) Active. Medications Reconciled  Social History Mammie Lorenzo, LPN; 1/44/3154 0:08 PM) Alcohol use Occasional alcohol use. Caffeine use Coffee, Tea. Illicit drug use Prefer to discuss with provider. Tobacco use Current every day smoker.  Family History Mammie Lorenzo, LPN; 6/[birth year]/1950 9:32 PM) Arthritis Father, Mother. Colon Polyps Mother. Heart Disease Brother, Father. Hypertension Brother.  Other Problems Mammie Lorenzo, LPN; 6/71/2458 0:99 PM) High blood pressure     Review of Systems Claiborne Billings Dockery LPN; 8/33/8250 5:39 PM) General Not Present- Appetite Loss, Chills, Fatigue, Fever, Night Sweats, Weight Gain and Weight Loss. Skin Present- New Lesions. Not  Present- Change in Wart/Mole, Dryness, Hives, Jaundice, Non-Healing Wounds, Rash and Ulcer. HEENT Present- Seasonal Allergies, Visual Disturbances and Wears glasses/contact lenses. Not Present- Earache, Hearing Loss, Hoarseness, Nose Bleed, Oral Ulcers, Ringing in the Ears, Sinus Pain, Sore Throat and Yellow Eyes. Respiratory Not Present- Bloody sputum, Chronic Cough, Difficulty Breathing, Snoring and Wheezing. Cardiovascular Present- Leg Cramps. Not Present- Chest Pain, Difficulty Breathing Lying Down, Palpitations, Rapid Heart Rate, Shortness of Breath and Swelling of Extremities. Gastrointestinal Present- Rectal Pain. Not Present- Abdominal Pain, Bloating, Bloody Stool, Change in Bowel Habits, Chronic diarrhea, Constipation, Difficulty Swallowing, Excessive gas, Gets full quickly at meals, Hemorrhoids, Indigestion, Nausea and Vomiting. Male Genitourinary Not Present- Blood in Urine, Change in Urinary Stream, Frequency, Impotence, Nocturia, Painful Urination, Urgency and Urine Leakage. Musculoskeletal Present- Swelling of Extremities. Not Present- Back Pain, Joint Pain, Joint Stiffness, Muscle Pain and Muscle Weakness. Neurological Not Present- Decreased Memory, Fainting, Headaches, Numbness, Seizures, Tingling, Tremor, Trouble walking and Weakness. Psychiatric Not Present- Anxiety, Bipolar, Change in Sleep Pattern, Depression, Fearful and Frequent crying. Endocrine Not Present- Cold Intolerance, Excessive Hunger, Hair Changes, Heat Intolerance, Hot flashes and New Diabetes. Hematology Not Present- Blood Thinners, Easy Bruising, Excessive bleeding, Gland problems, HIV and Persistent Infections.  Vitals Claiborne Billings Dockery LPN; 7/67/3419 3:79 PM) 08/02/2016 2:14 PM Weight: 131.8 lb Height: 70in Body Surface Area: 1.75 m Body Mass Index: 18.91 kg/m  Temp.: 97.79F(Oral)  Pulse: 79 (Regular)  BP: 124/70 (Sitting, Left Arm, Standard)      Physical Exam Leighton Ruff MD; 0/24/0973 2:29  PM)  General Mental Status-Alert. General Appearance-Not in acute distress. Build & Nutrition-Well nourished. Posture-Normal posture. Gait-Normal.  Head and Neck Head-normocephalic, atraumatic with no lesions or palpable masses. Trachea-midline.  Chest and Lung Exam Chest and lung exam reveals -on auscultation, normal  breath sounds, no adventitious sounds and normal vocal resonance.  Cardiovascular Cardiovascular examination reveals -normal heart sounds, regular rate and rhythm with no murmurs and no digital clubbing, cyanosis, edema, increased warmth or tenderness.  Abdomen Inspection Inspection of the abdomen reveals - No Hernias. Palpation/Percussion Palpation and Percussion of the abdomen reveal - Soft, Non Tender, No Rigidity (guarding), No hepatosplenomegaly and No Palpable abdominal masses.  Rectal Anorectal Exam External - normal external exam. Internal - Note: Slight nodularity palpated at left lateral anal canal. No fixed masses palpated.  Neurologic Neurologic evaluation reveals -alert and oriented x 3 with no impairment of recent or remote memory, normal attention span and ability to concentrate, normal sensation and normal coordination.  Musculoskeletal Normal Exam - Bilateral-Upper Extremity Strength Normal and Lower Extremity Strength Normal.   Results Leighton Ruff MD; 7/74/1423 2:31 PM) Procedures  Name Value Date ANOSCOPY, DIAGNOSTIC (95320) [ Hemorrhoids ] Procedure Other: Procedure: Anoscopy Surgeon: Marcello Moores After the risks and benefits were explained, verbal consent was obtained for above procedure. A medical assistant chaperone was present thoroughout the entire procedure. Anesthesia: none Diagnosis: rectal bleeding Findings: grade 3 left lateral internal hemorrhoid  Performed: 08/02/2016 2:31 PM    Assessment & Plan Leighton Ruff MD; 2/33/4356 2:31 PM)  PROLAPSED INTERNAL HEMORRHOIDS, GRADE 3 (Y61.6) Impression:  61 year old male who presented to the office for evaluation of rectal bleeding. He was seen in the ER and thought to have a fungating anal mass. On physical exam today he appears to have a prolapsed grade 3 left lateral internal hemorrhoid. There are no fixed masses. I have recommended an exam under anesthesia with hemorrhoidectomy. There is some nodularity on the hemorrhoid at the dentate line which I presume is chronic inflammation but cannot rule out AIN.  We discussed that he will most likely need a hemorrhoidectomy and this can cause pain for 3-4 weeks as he heals. I recommend that he stay out of work during this time as he does a rather physical job.

## 2016-08-24 ENCOUNTER — Ambulatory Visit (HOSPITAL_COMMUNITY)
Admission: EM | Admit: 2016-08-24 | Discharge: 2016-08-24 | Disposition: A | Discharge status: Home / Self Care | Payer: 59 | Attending: Family Medicine | Admitting: Family Medicine

## 2016-08-24 ENCOUNTER — Encounter (HOSPITAL_COMMUNITY): Payer: Self-pay | Admitting: Emergency Medicine

## 2016-08-24 DIAGNOSIS — M25462 Effusion, left knee: Secondary | ICD-10-CM

## 2016-08-24 DIAGNOSIS — G8929 Other chronic pain: Secondary | ICD-10-CM

## 2016-08-24 DIAGNOSIS — M25562 Pain in left knee: Secondary | ICD-10-CM

## 2016-08-24 MED ORDER — TRIAMCINOLONE ACETONIDE 40 MG/ML IJ SUSP
40.0000 mg | Freq: Once | INTRAMUSCULAR | Status: AC
  Administered 2016-08-24: 40 mg via INTRA_ARTICULAR

## 2016-08-24 MED ORDER — TRIAMCINOLONE ACETONIDE 40 MG/ML IJ SUSP
INTRAMUSCULAR | Status: AC
  Filled 2016-08-24: qty 1

## 2016-08-24 MED ORDER — NAPROXEN 500 MG PO TABS: 500.0000 mg | ORAL_TABLET | Freq: Two times a day (BID) | ORAL | 0 refills | Status: DC

## 2016-08-24 NOTE — ED Provider Notes (Signed)
CSN: [id number]     Arrival date & time 08/24/16  1733 History   None    Chief Complaint  Patient presents with  . Knee Pain   (Consider location/radiation/quality/duration/timing/severity/associated sxs/prior Treatment) 61 yo male with PMH of osteoarthritis of the left knee comes in with 2 month history of left knee swelling and pain. He was playing soccer 2 months ago when he twisted his left knee and heard a crunch. He had pain in the knee and was limping for a few days. Left knee pain has subsided and is only painful on and off with activity. Left knee swelling started after event, and has continued to increase. Experiences occasional locking of the knee that was present prior to event as well. Denies numbness/tinling. Denies redness, increased warmth, fever. Had taken Tylenol and Ibuprofen on and off for pain. Would like evaluation as well as aspiration of the knee.       Past Medical History:  Diagnosis Date  . Glaucoma   . Hypertension    Past Surgical History:  Procedure Laterality Date  . ARTHROSCOPIC REPAIR ACL    . EYE SURGERY     History reviewed. No pertinent family history. Social History  Substance Use Topics  . Smoking status: Current Every Day Smoker    Types: Cigars  . Smokeless tobacco: Not on file  . Alcohol use Yes     Comment: occasionally    Review of Systems  Constitutional: Negative for chills, diaphoresis, fatigue and fever.  Respiratory: Negative for chest tightness, shortness of breath and wheezing.   Cardiovascular: Negative for chest pain and palpitations.  Musculoskeletal: Positive for arthralgias, joint swelling and myalgias.  Skin: Negative for rash and wound.    Allergies  Patient has no known allergies.  Home Medications   Prior to Admission medications   Medication Sig Start Date End Date Taking? Authorizing Provider  aspirin 325 MG tablet Take 325 mg by mouth daily.    [provider]  docusate sodium (COLACE) 100 MG  capsule Take 1 capsule (100 mg total) by mouth every 12 (twelve) hours. 07/26/16   Lysbeth Penner, FNP  hydrochlorothiazide (HYDRODIURIL) 50 MG tablet Take 25 mg by mouth daily.  10/27/14   [provider]  IRON PO Take 4 tablets by mouth daily.    [provider]  naproxen (NAPROSYN) 500 MG tablet Take 1 tablet (500 mg total) by mouth 2 (two) times daily. 08/24/16   Tasia Catchings, Tahsin Benyo V, PA-C  POTASSIUM CHLORIDE PO Take by mouth.    [provider]  timolol (TIMOPTIC) 0.5 % ophthalmic solution Place 1 drop into the left eye daily.  11/19/14   [provider]   Meds Ordered and Administered this Visit   Medications  triamcinolone acetonide (KENALOG-40) injection 40 mg (40 mg Intra-articular Given 08/24/16 1832)    BP (!) 124/93 (BP Location: Left Arm)   Pulse 70   Temp 98.7 F (37.1 C) (Oral)   Resp 18   SpO2 100%  No data found.   Physical Exam  Constitutional: He is oriented to person, place, and time. He appears well-developed and well-nourished. No distress.  HENT:  Head: Normocephalic and atraumatic.  Eyes: Conjunctivae are normal. Pupils are equal, round, and reactive to light.  Neck: Neck supple. No tracheal deviation present. No thyromegaly present.  Musculoskeletal:       Right knee: Normal. He exhibits normal range of motion, no swelling and no effusion. No tenderness found.  Left knee: He exhibits swelling and effusion. He exhibits normal range of motion, no ecchymosis, no erythema, no LCL laxity, no bony tenderness and no MCL laxity. No tenderness found. No medial joint line and no lateral joint line tenderness noted.  Neurological: He is alert and oriented to person, place, and time.  Skin: Skin is warm and dry.  Psychiatric: He has a normal mood and affect. His behavior is normal. Judgment normal.    Urgent Care Course     .Joint Aspiration/Arthrocentesis Date/Time: 08/24/2016 7:34 PM Performed by: Ok Edwards Authorized by: Robyn Haber   Consent:    Consent obtained:  Verbal   Consent given by:  Patient   Risks discussed:  Bleeding, infection, pain and incomplete drainage   Alternatives discussed:  Alternative treatment and referral Location:    Location:  Knee   Knee:  L knee Anesthesia (see MAR for exact dosages):    Anesthesia method:  Local infiltration   Local anesthetic:  Lidocaine 1% w/o epi Procedure details:    Preparation: Patient was prepped and draped in usual sterile fashion     Needle gauge:  20 G   Ultrasound guidance: no     Approach:  Lateral   Aspirate amount:  44mL   Aspirate characteristics:  Clear and yellow   Steroid injected: yes     Specimen collected: no   Post-procedure details:    Dressing:  Adhesive bandage   Patient tolerance of procedure:  Tolerated well, no immediate complications Comments:     27mL of lidocaine and 92mL of kenolog   (including critical care time)  Labs Review Labs Reviewed - No data to display  Imaging Review No results found.       MDM   1. Effusion of left knee   2. Chronic pain of left knee    1. Aspiration of fluid in the left knee today. Injected 85mL of lidocaine and 31mL of kenolog for pain and inflammation.  Patient tolerated well. Patient to monitor for erythema, increased warmth, fever.  2. Patient to start Naproxen 500mg  BID x 7 days for pain and inflammation. Discussed with patient that given the type of injury, may need further imaging if pain and effusion does not resolve. Orthopedic referrals given.     Ok Edwards, PA-C 08/24/16 [phone]

## 2016-08-24 NOTE — ED Triage Notes (Signed)
Left knee pain and swelling.  Patient reports approximately 2 months ago , pivoted on left leg and felt crunch in knee.  Pain and swelling has increased since then

## 2016-08-25 ENCOUNTER — Encounter (HOSPITAL_BASED_OUTPATIENT_CLINIC_OR_DEPARTMENT_OTHER): Payer: Self-pay | Admitting: *Deleted

## 2016-08-25 MED FILL — NAPROXEN 500 MG TABLET: 500 | 7 days supply | Qty: 14 | Fill #0

## 2016-08-25 NOTE — Progress Notes (Signed)
NPO AFTER MN.  ARRIVE AT 1000.  NEEDS ISTAT 8 AND EKG.

## 2016-09-01 ENCOUNTER — Encounter (HOSPITAL_BASED_OUTPATIENT_CLINIC_OR_DEPARTMENT_OTHER): Payer: Self-pay

## 2016-09-01 ENCOUNTER — Encounter (HOSPITAL_BASED_OUTPATIENT_CLINIC_OR_DEPARTMENT_OTHER)
Admission: RE | Disposition: A | Discharge status: Home / Self Care | Payer: Self-pay | Source: Ambulatory Visit | Attending: General Surgery

## 2016-09-01 ENCOUNTER — Ambulatory Visit (HOSPITAL_BASED_OUTPATIENT_CLINIC_OR_DEPARTMENT_OTHER)
Admission: RE | Admit: 2016-09-01 | Discharge: 2016-09-01 | Disposition: A | Discharge status: Home / Self Care | Payer: 59 | Source: Ambulatory Visit | Attending: General Surgery | Admitting: General Surgery

## 2016-09-01 ENCOUNTER — Ambulatory Visit (HOSPITAL_BASED_OUTPATIENT_CLINIC_OR_DEPARTMENT_OTHER): Payer: 59 | Admitting: Anesthesiology

## 2016-09-01 DIAGNOSIS — C211 Malignant neoplasm of anal canal: Secondary | ICD-10-CM | POA: Insufficient documentation

## 2016-09-01 DIAGNOSIS — Z86718 Personal history of other venous thrombosis and embolism: Secondary | ICD-10-CM | POA: Insufficient documentation

## 2016-09-01 DIAGNOSIS — Z8249 Family history of ischemic heart disease and other diseases of the circulatory system: Secondary | ICD-10-CM | POA: Diagnosis not present

## 2016-09-01 DIAGNOSIS — Z79899 Other long term (current) drug therapy: Secondary | ICD-10-CM | POA: Insufficient documentation

## 2016-09-01 DIAGNOSIS — Z7982 Long term (current) use of aspirin: Secondary | ICD-10-CM | POA: Insufficient documentation

## 2016-09-01 DIAGNOSIS — Z8371 Family history of colonic polyps: Secondary | ICD-10-CM | POA: Insufficient documentation

## 2016-09-01 DIAGNOSIS — F17200 Nicotine dependence, unspecified, uncomplicated: Secondary | ICD-10-CM | POA: Insufficient documentation

## 2016-09-01 DIAGNOSIS — K642 Third degree hemorrhoids: Secondary | ICD-10-CM | POA: Diagnosis not present

## 2016-09-01 DIAGNOSIS — I10 Essential (primary) hypertension: Secondary | ICD-10-CM | POA: Diagnosis not present

## 2016-09-01 DIAGNOSIS — Z8261 Family history of arthritis: Secondary | ICD-10-CM | POA: Insufficient documentation

## 2016-09-01 DIAGNOSIS — K625 Hemorrhage of anus and rectum: Secondary | ICD-10-CM | POA: Diagnosis not present

## 2016-09-01 HISTORY — DX: Personal history of other (healed) physical injury and trauma: Z87.828

## 2016-09-01 HISTORY — DX: Presence of spectacles and contact lenses: Z97.3

## 2016-09-01 HISTORY — DX: Legal blindness, as defined in USA: H54.8

## 2016-09-01 HISTORY — DX: Anemia, unspecified: D64.9

## 2016-09-01 HISTORY — PX: HEMORRHOID SURGERY: SHX153

## 2016-09-01 HISTORY — DX: Personal history of other specified conditions: Z87.898

## 2016-09-01 HISTORY — DX: Iron deficiency: E61.1

## 2016-09-01 HISTORY — DX: Unspecified osteoarthritis, unspecified site: M19.90

## 2016-09-01 HISTORY — DX: Personal history of other venous thrombosis and embolism: Z86.718

## 2016-09-01 HISTORY — DX: Atrial septal defect: Q21.1

## 2016-09-01 HISTORY — DX: Patent foramen ovale: Q21.12

## 2016-09-01 HISTORY — DX: Glaucoma secondary to eye trauma, left eye, stage unspecified: H40.32X0

## 2016-09-01 HISTORY — DX: Personal history of other medical treatment: Z92.89

## 2016-09-01 LAB — POCT I-STAT, CHEM 8
BUN: 19 mg/dL (ref 6–20)
Calcium, Ion: 1.29 mmol/L (ref 1.15–1.40)
Chloride: 106 mmol/L (ref 101–111)
Creatinine, Ser: 0.8 mg/dL (ref 0.61–1.24)
Glucose, Bld: 88 mg/dL (ref 65–99)
HEMATOCRIT: 42 % (ref 39.0–52.0)
HEMOGLOBIN: 14.3 g/dL (ref 13.0–17.0)
POTASSIUM: 4.5 mmol/L (ref 3.5–5.1)
Sodium: 139 mmol/L (ref 135–145)
TCO2: 26 mmol/L (ref 0–100)

## 2016-09-01 SURGERY — HEMORRHOIDECTOMY
Anesthesia: Monitor Anesthesia Care

## 2016-09-01 MED ORDER — OXYCODONE HCL 5 MG PO TABS
5.0000 mg | ORAL_TABLET | Freq: Once | ORAL | Status: DC | PRN
  Filled 2016-09-01: qty 1

## 2016-09-01 MED ORDER — BUPIVACAINE-EPINEPHRINE 0.5% -1:200000 IJ SOLN
INTRAMUSCULAR | Status: DC | PRN
  Administered 2016-09-01: 30 mL

## 2016-09-01 MED ORDER — PROMETHAZINE HCL 25 MG/ML IJ SOLN
6.2500 mg | INTRAMUSCULAR | Status: DC | PRN
  Filled 2016-09-01: qty 1

## 2016-09-01 MED ORDER — PROPOFOL 500 MG/50ML IV EMUL
INTRAVENOUS | Status: AC
Start: 2016-09-01 — End: 2016-09-01
  Filled 2016-09-01: qty 50

## 2016-09-01 MED ORDER — ACETAMINOPHEN 500 MG PO TABS
1000.0000 mg | ORAL_TABLET | Freq: Four times a day (QID) | ORAL | Status: DC
  Filled 2016-09-01: qty 2

## 2016-09-01 MED ORDER — LIDOCAINE 2% (20 MG/ML) 5 ML SYRINGE
INTRAMUSCULAR | Status: AC
  Filled 2016-09-01: qty 5

## 2016-09-01 MED ORDER — MIDAZOLAM HCL 5 MG/5ML IJ SOLN
INTRAMUSCULAR | Status: DC | PRN
  Administered 2016-09-01: 2 mg via INTRAVENOUS

## 2016-09-01 MED ORDER — OXYCODONE HCL 5 MG PO TABS
5.0000 mg | ORAL_TABLET | ORAL | Status: DC | PRN
  Filled 2016-09-01: qty 2

## 2016-09-01 MED ORDER — HYDROMORPHONE HCL 1 MG/ML IJ SOLN
0.2500 mg | INTRAMUSCULAR | Status: DC | PRN
  Filled 2016-09-01: qty 0.5

## 2016-09-01 MED ORDER — MIDAZOLAM HCL 2 MG/2ML IJ SOLN
INTRAMUSCULAR | Status: AC
Start: 2016-09-01 — End: 2016-09-01
  Filled 2016-09-01: qty 2

## 2016-09-01 MED ORDER — ACETAMINOPHEN 650 MG RE SUPP
650.0000 mg | RECTAL | Status: DC | PRN
  Filled 2016-09-01: qty 1

## 2016-09-01 MED ORDER — FENTANYL CITRATE (PF) 100 MCG/2ML IJ SOLN
INTRAMUSCULAR | Status: AC
  Filled 2016-09-01: qty 2

## 2016-09-01 MED ORDER — BUPIVACAINE LIPOSOME 1.3 % IJ SUSP
INTRAMUSCULAR | Status: DC | PRN
  Administered 2016-09-01: 20 mL

## 2016-09-01 MED ORDER — FENTANYL CITRATE (PF) 100 MCG/2ML IJ SOLN
INTRAMUSCULAR | Status: DC | PRN
  Administered 2016-09-01: 25 ug via INTRAVENOUS

## 2016-09-01 MED ORDER — SODIUM CHLORIDE 0.9 % IV SOLN
250.0000 mL | INTRAVENOUS | Status: DC | PRN
  Filled 2016-09-01: qty 250

## 2016-09-01 MED ORDER — OXYCODONE HCL 5 MG PO TABS: 5.0000 mg | ORAL_TABLET | Freq: Four times a day (QID) | ORAL | 0 refills | Status: DC | PRN

## 2016-09-01 MED ORDER — SODIUM CHLORIDE 0.9% FLUSH
3.0000 mL | INTRAVENOUS | Status: DC | PRN
  Filled 2016-09-01: qty 3

## 2016-09-01 MED ORDER — SODIUM CHLORIDE 0.9% FLUSH
3.0000 mL | Freq: Two times a day (BID) | INTRAVENOUS | Status: DC
  Filled 2016-09-01: qty 3

## 2016-09-01 MED ORDER — OXYCODONE HCL 5 MG/5ML PO SOLN
5.0000 mg | Freq: Once | ORAL | Status: DC | PRN
  Filled 2016-09-01: qty 5

## 2016-09-01 MED ORDER — ACETAMINOPHEN 325 MG PO TABS
650.0000 mg | ORAL_TABLET | ORAL | Status: DC | PRN
  Filled 2016-09-01: qty 2

## 2016-09-01 MED ORDER — PROPOFOL 500 MG/50ML IV EMUL
INTRAVENOUS | Status: DC | PRN
  Administered 2016-09-01: 150 ug/kg/min via INTRAVENOUS

## 2016-09-01 MED ORDER — DIAZEPAM 5 MG PO TABS: 5.0000 mg | ORAL_TABLET | Freq: Three times a day (TID) | ORAL | 0 refills | Status: AC | PRN

## 2016-09-01 MED ORDER — LACTATED RINGERS IV SOLN
INTRAVENOUS | Status: DC
  Administered 2016-09-01: 11:00:00 via INTRAVENOUS
  Filled 2016-09-01: qty 1000

## 2016-09-01 MED ORDER — PROPOFOL 10 MG/ML IV BOLUS
INTRAVENOUS | Status: DC | PRN
  Administered 2016-09-01: 30 mg via INTRAVENOUS

## 2016-09-01 MED FILL — diazePAM 5 MG TABS: 5 | 2 days supply | Qty: 5 | Fill #0

## 2016-09-01 MED FILL — oxyCODONE HCL 5 MG TABS: 5 | 3 days supply | Qty: 20 | Fill #0

## 2016-09-01 SURGICAL SUPPLY — 38 items
BLADE HEX COATED 2.75 (ELECTRODE) ×2 IMPLANT
BLADE SURG 10 STRL SS (BLADE) ×1 IMPLANT
BNDG GAUZE ELAST 4 BULKY (GAUZE/BANDAGES/DRESSINGS) ×1 IMPLANT
BRIEF STRETCH FOR OB PAD LRG (UNDERPADS AND DIAPERS) IMPLANT
COVER BACK TABLE 60X90IN (DRAPES) ×2 IMPLANT
COVER MAYO STAND STRL (DRAPES) ×2 IMPLANT
DRAPE LAPAROTOMY 100X72 PEDS (DRAPES) ×2 IMPLANT
DRAPE UTILITY XL STRL (DRAPES) ×2 IMPLANT
ELECT BLADE 6.5 .24CM SHAFT (ELECTRODE) IMPLANT
ELECT REM PT RETURN 9FT ADLT (ELECTROSURGICAL) ×2
ELECTRODE REM PT RTRN 9FT ADLT (ELECTROSURGICAL) ×1 IMPLANT
GAUZE SPONGE 4X4 16PLY XRAY LF (GAUZE/BANDAGES/DRESSINGS) IMPLANT
GLOVE BIO SURGEON STRL SZ 6.5 (GLOVE) ×2 IMPLANT
GLOVE INDICATOR 7.0 STRL GRN (GLOVE) ×2 IMPLANT
GOWN STRL REUS W/TWL 2XL LVL3 (GOWN DISPOSABLE) ×4 IMPLANT
KIT RM TURNOVER CYSTO AR (KITS) ×2 IMPLANT
NEEDLE HYPO 22GX1.5 SAFETY (NEEDLE) ×2 IMPLANT
NS IRRIG 500ML POUR BTL (IV SOLUTION) ×2 IMPLANT
PACK BASIN DAY SURGERY FS (CUSTOM PROCEDURE TRAY) ×2 IMPLANT
PAD ABD 8X10 STRL (GAUZE/BANDAGES/DRESSINGS) ×2 IMPLANT
PAD ARMBOARD 7.5X6 YLW CONV (MISCELLANEOUS) ×1 IMPLANT
PENCIL BUTTON HOLSTER BLD 10FT (ELECTRODE) ×2 IMPLANT
SPONGE GAUZE 4X4 12PLY (GAUZE/BANDAGES/DRESSINGS) ×2 IMPLANT
SPONGE SURGIFOAM ABS GEL 100 (HEMOSTASIS) IMPLANT
SPONGE SURGIFOAM ABS GEL 12-7 (HEMOSTASIS) IMPLANT
SUT CHROMIC 2 0 SH (SUTURE) ×1 IMPLANT
SUT CHROMIC 3 0 SH 27 (SUTURE) IMPLANT
SUT PROLENE 2 0 BLUE (SUTURE) IMPLANT
SUT VIC AB 2-0 SH 27 (SUTURE)
SUT VIC AB 2-0 SH 27XBRD (SUTURE) IMPLANT
SUT VIC AB 4-0 P-3 18XBRD (SUTURE) IMPLANT
SUT VIC AB 4-0 P3 18 (SUTURE)
SUT VIC AB 4-0 SH 18 (SUTURE) IMPLANT
SYR CONTROL 10ML LL (SYRINGE) ×2 IMPLANT
TRAY DSU PREP LF (CUSTOM PROCEDURE TRAY) ×2 IMPLANT
TUBE CONNECTING 12X1/4 (SUCTIONS) ×2 IMPLANT
WATER STERILE IRR 500ML POUR (IV SOLUTION) IMPLANT
YANKAUER SUCT BULB TIP NO VENT (SUCTIONS) ×2 IMPLANT

## 2016-09-01 NOTE — Discharge Instructions (Addendum)
°Post Anesthesia Home Care Instructions ° °Activity: °Get plenty of rest for the remainder of the day. A responsible individual must stay with you for 24 hours following the procedure.  °For the next 24 hours, DO NOT: °-Drive a car °-Operate machinery °-Drink alcoholic beverages °-Take any medication unless instructed by your physician °-Make any legal decisions or sign important papers. ° °Meals: °Start with liquid foods such as gelatin or soup. Progress to regular foods as tolerated. Avoid greasy, spicy, heavy foods. If nausea and/or vomiting occur, drink only clear liquids until the nausea and/or vomiting subsides. Call your physician if vomiting continues. ° °Special Instructions/Symptoms: °Your throat may feel dry or sore from the anesthesia or the breathing tube placed in your throat during surgery. If this causes discomfort, gargle with warm salt water. The discomfort should disappear within 24 hours. ° °If you had a scopolamine patch placed behind your ear for the management of post- operative nausea and/or vomiting: ° °1. The medication in the patch is effective for 72 hours, after which it should be removed.  Wrap patch in a tissue and discard in the trash. Wash hands thoroughly with soap and water. °2. You may remove the patch earlier than 72 hours if you experience unpleasant side effects which may include dry mouth, dizziness or visual disturbances. °3. Avoid touching the patch. Wash your hands with soap and water after contact with the patch. °  °ANORECTAL SURGERY: POST OP INSTRUCTIONS °1. Take your usually prescribed home medications unless otherwise directed. °2. DIET: During the first few hours after surgery sip on some liquids until you are able to urinate.  It is normal to not urinate for several hours after this surgery.  If you feel uncomfortable, please contact the office for instructions.  After you are able to urinate,you may eat, if you feel like it.  Follow a light bland diet the first 24  hours after arrival home, such as soup, liquids, crackers, etc.  Be sure to include lots of fluids daily (6-8 glasses).  Avoid fast food or heavy meals, as your are more likely to get nauseated.  Eat a low fat diet the next few days after surgery.  Limit caffeine intake to 1-2 servings a day. °3. PAIN CONTROL: °a. Pain is best controlled by a usual combination of several different methods TOGETHER: °i. Muscle relaxation °1.  Soak in a warm bath (or Sitz bath) three times a day and after bowel movements.  Continue to do this until all pain is resolved. °2. Take the muscle relaxer (Valium) every 6 hours for the first 2 days after surgery  °ii. Over the counter pain medication °iii. Prescription pain medication °b. Most patients will experience some swelling and discomfort in the anus/rectal area and incisions.  Heat such as warm towels, sitz baths, warm baths, etc to help relax tight/sore spots and speed recovery.  Some people prefer to use ice, especially in the first couple days after surgery, as it may decrease the pain and swelling, or alternate between ice & heat.  Experiment to what works for you.  Swelling and bruising can take several weeks to resolve.  Pain can take even longer to completely resolve. °c. It is helpful to take an over-the-counter pain medication regularly for the first few weeks.  Choose one of the following that works best for you: °i. Naproxen (Aleve, etc)  Two 220mg tabs twice a day °ii. Ibuprofen (Advil, etc) Three 200mg tabs four times a day (every meal & bedtime) °  d. A  prescription for pain medication (such as percocet, oxycodone, hydrocodone, etc) should be given to you upon discharge.  Take your pain medication as prescribed.  °i. If you are having problems/concerns with the prescription medicine (does not control pain, nausea, vomiting, rash, itching, etc), please call us [phone] to see if we need to switch you to a different pain medicine that will work better for you and/or  control your side effect better. °ii. If you need a refill on your pain medication, please contact your pharmacy.  They will contact our office to request authorization. Prescriptions will not be filled after 5 pm or on week-ends. °4. KEEP YOUR BOWELS REGULAR and AVOID CONSTIPATION °a. The goal is one to two soft bowel movements a day.  You should at least have a bowel movement every other day. °b. Avoid getting constipated.  Between the surgery and the pain medications, it is common to experience some constipation. This can be very painful after rectal surgery.  Increasing fluid intake and taking a fiber supplement (such as Metamucil, Citrucel, FiberCon, etc) 1-2 times a day regularly will usually help prevent this problem from occurring.  A stool softener like colace is also recommended.  This can be purchased over the counter at your pharmacy.  You can take it up to 3 times a day.  If you do not have a bowel movement after 24 hrs since your surgery, take one does of milk of magnesia.  If you still haven't had a bowel movement 8-12 hours after that dose, take another dose.  If you don't have a bowel movement 48 hrs after surgery, purchase a Fleets enema from the drug store and administer gently per package instructions.  If you still are having trouble with your bowel movements after that, please call the office for further instructions. °c. If you develop diarrhea or have many loose bowel movements, simplify your diet to bland foods & liquids for a few days.  Stop any stool softeners and decrease your fiber supplement.  Switching to mild anti-diarrheal medications (Kayopectate, Pepto Bismol) can help.  If this worsens or does not improve, please call us. ° °5. Wound Care °a. Remove your bandages before your first bowel movement or 8 hours after surgery.     °b. Remove any wound packing material at this tim,e as well.  You do not need to repack the wound unless instructed otherwise.  Wear an absorbent pad or soft  cotton gauze in your underwear to catch any drainage and help keep the area clean. You should change this every 2-3 hours while awake. °c. Keep the area clean and dry.  Bathe / shower every day, especially after bowel movements.  Keep the area clean by showering / bathing over the incision / wound.   It is okay to soak an open wound to help wash it.  Wet wipes or showers / gentle washing after bowel movements is often less traumatic than regular toilet paper. °d. You may have some styrofoam-like soft packing in the rectum which will come out with the first bowel movement.  °e. You will often notice bleeding with bowel movements.  This should slow down by the end of the first week of surgery °f. Expect some drainage.  This should slow down, too, by the end of the first week of surgery.  Wear an absorbent pad or soft cotton gauze in your underwear until the drainage stops. °g. Do Not sit on a rubber or pillow ring.    This can make you symptoms worse.  You may sit on a soft pillow if needed.  °6. ACTIVITIES as tolerated:   °a. You may resume regular (light) daily activities beginning the next day--such as daily self-care, walking, climbing stairs--gradually increasing activities as tolerated.  If you can walk 30 minutes without difficulty, it is safe to try more intense activity such as jogging, treadmill, bicycling, low-impact aerobics, swimming, etc. °b. Save the most intensive and strenuous activity for last such as sit-ups, heavy lifting, contact sports, etc  Refrain from any heavy lifting or straining until you are off narcotics for pain control.   °c. You may drive when you are no longer taking prescription pain medication, you can comfortably sit for long periods of time, and you can safely maneuver your car and apply brakes. °d. You may have sexual intercourse when it is comfortable.  °7. FOLLOW UP in our office °a. Please call [facility] at [phone] to set up an appointment to see your surgeon in the office for  a follow-up appointment approximately 3-4 weeks after your surgery. °b. Make sure that you call for this appointment the day you arrive home to insure a convenient appointment time. °10. IF YOU HAVE DISABILITY OR FAMILY LEAVE FORMS, BRING THEM TO THE OFFICE FOR PROCESSING.  DO NOT GIVE THEM TO YOUR DOCTOR. ° ° ° ° °WHEN TO CALL US [phone]: °1. Poor pain control °2. Reactions / problems with new medications (rash/itching, nausea, etc)  °3. Fever over 101.5 F (38.5 C) °4. Inability to urinate °5. Nausea and/or vomiting °6. Worsening swelling or bruising °7. Continued bleeding from incision. °8. Increased pain, redness, or drainage from the incision ° °The clinic staff is available to answer your questions during regular business hours (8:30am-5pm).  Please don’t hesitate to call and ask to speak to one of our nurses for clinical concerns.   A surgeon from [facility] is always on call at the hospitals °  °If you have a medical emergency, go to the nearest emergency room or call 911. °  ° °[facility] °[address], [address], [place], [place]  [address] ? °MAIN: [phone] ? TOLL FREE: [phone] ? °FAX [phone] °[email or web address] ° ° ° °

## 2016-09-01 NOTE — Anesthesia Postprocedure Evaluation (Signed)
Anesthesia Post Note  Patient: Juan David  Procedure(s) Performed: Procedure(s) (LRB): HEMORRHOIDECTOMY (N/A)     Patient location during evaluation: PACU Anesthesia Type: MAC Level of consciousness: awake and alert Pain management: pain level controlled Vital Signs Assessment: post-procedure vital signs reviewed and stable Respiratory status: spontaneous breathing, nonlabored ventilation, respiratory function stable and patient connected to nasal cannula oxygen Cardiovascular status: stable and blood pressure returned to baseline Anesthetic complications: no    Last Vitals:  Vitals:   09/01/16 1245 09/01/16 1300  BP: (!) 131/95 (!) 152/94  Pulse: (!) 57 60  Resp: 11 15  Temp:      Last Pain:  Vitals:   09/01/16 1300  TempSrc:   PainSc: 0-No pain                 Nicholad Kautzman P Luby Seamans

## 2016-09-01 NOTE — Interval H&P Note (Signed)
History and Physical Interval Note:  09/01/2016 11:23 AM  Juan David  has presented today for surgery, with the diagnosis of grade 3 hemorrhoid  The various methods of treatment have been discussed with the patient and family. After consideration of risks, benefits and other options for treatment, the patient has consented to  Procedure(s): HEMORRHOIDECTOMY (N/A) as a surgical intervention .  The patient's history has been reviewed, patient examined, no change in status, stable for surgery.  I have reviewed the patient's chart and labs.  Questions were answered to the patient's satisfaction.     Rosario Adie, MD  [facility]

## 2016-09-01 NOTE — Op Note (Signed)
09/01/2016  11:49 AM  PATIENT:  Juan David  61 y.o. male  Patient Care Team: Leonard Downing, MD as PCP - General (Family Medicine)  PRE-OPERATIVE DIAGNOSIS:  grade 3 hemorrhoid  POST-OPERATIVE DIAGNOSIS:  Anal canal mass  PROCEDURE:  SINGLE COLUMN HEMORRHOIDECTOMY   Surgeon(s): Leighton Ruff, MD  ASSISTANT: none   ANESTHESIA:   local and MAC  SPECIMEN:  Source of Specimen:  anal canal mass  DISPOSITION OF SPECIMEN:  PATHOLOGY  COUNTS:  YES  PLAN OF CARE: Discharge to home after PACU  PATIENT DISPOSITION:  PACU - hemodynamically stable.  INDICATION: 61 y.o. M with bleeding and prolapsing anal mass   OR FINDINGS: R lateral anal mass just proximal to the dentate line  DESCRIPTION: the patient was identified in the preoperative holding area and taken to the OR where they were laid on the operating room table.  MAC anesthesia was induced without difficulty. The patient was then positioned in prone jackknife position with buttocks gently taped apart.  The patient was then prepped and draped in usual sterile fashion.  SCDs were noted to be in place prior to the initiation of anesthesia. A surgical timeout was performed indicating the correct patient, procedure, positioning and need for preoperative antibiotics.  A rectal block was performed using Marcaine with epinephrine.    I began with a digital rectal exam.  The mass could be palpated on the right side.  I then placed a Hill-Ferguson anoscope into the anal canal and evaluated this completely.  There was a mobile right lateral anal canal mass. It felt too dense to be a hemorrhoid. I decided to remove it as if it were hemorrhoid. I made an incision in the anoderm using a 10 blade scalpel. I used Metzenbaum scissors to dissect the mucosa off of the internal sphincter until I was around the mass. I then used Metzenbaum scissors to divide the mucosa on both sides down to an area mucosa proximal to the mass. This was then  sent to pathology for further examination. I used a 2-0 chromic running suture to obtain hemostasis and closed the anoderm and rectal mucosa. I used a second figure-of-eight stitch to close an area that continued to bleed afterwards. After that hemostasis was good. All counts are correct per operating room staff. A dressing was applied. The patient was awakened from anesthesia and sent to the post anesthesia care unit in stable condition.

## 2016-09-01 NOTE — Anesthesia Preprocedure Evaluation (Addendum)
Anesthesia Evaluation  Patient identified by MRN, date of birth, ID band Patient awake    Reviewed: Allergy & Precautions, NPO status , Patient's Chart, lab work & pertinent test results  Airway Mallampati: II  TM Distance: >3 FB Neck ROM: Full    Dental no notable dental hx.    Pulmonary Current Smoker (2 black and milds q day),    Pulmonary exam normal breath sounds clear to auscultation       Cardiovascular Exercise Tolerance: Good hypertension, Pt. on medications Normal cardiovascular exam Rhythm:Regular Rate:Normal     Neuro/Psych negative neurological ROS  negative psych ROS   GI/Hepatic negative GI ROS, Neg liver ROS,   Endo/Other  negative endocrine ROS  Renal/GU negative Renal ROS  negative genitourinary   Musculoskeletal negative musculoskeletal ROS (+)   Abdominal   Peds negative pediatric ROS (+)  Hematology   Anesthesia Other Findings DVT after knee surgery  Reproductive/Obstetrics negative OB ROS                            Anesthesia Physical Anesthesia Plan  ASA: II  Anesthesia Plan: MAC   Post-op Pain Management:    Induction: Intravenous  PONV Risk Score and Plan: 0 and Propofol and Midazolam  Airway Management Planned:   Additional Equipment:   Intra-op Plan:   Post-operative Plan:   Informed Consent: I have reviewed the patients History and Physical, chart, labs and discussed the procedure including the risks, benefits and alternatives for the proposed anesthesia with the patient or authorized representative who has indicated his/her understanding and acceptance.   Dental advisory given  Plan Discussed with: CRNA  Anesthesia Plan Comments:        Anesthesia Quick Evaluation

## 2016-09-01 NOTE — H&P (View-Only) (Signed)
History of Present Illness Juan Ruff MD; 7/56/4332 2:33 PM) The patient is a 61 year old male who presents with a complaint of Rectal bleeding. 61 year old male who presents today for evaluation of rectal bleeding. He has noticed a mass protruding from his anal canal over the past 7 months. He has to reduce it back into his anal canal or it will bleed. A couple weeks ago he developed acute rectal bleeding and was seen in the emergency department. A mass was palpated on physical exam and he was sent to me for further evaluation. He denies any history of sex a transmitted diseases or anal condyloma.   Past Surgical History Mammie Lorenzo, LPN; 9/51/8841 6:60 PM) Cataract Surgery Left. Knee Surgery Right.  Diagnostic Studies History Mammie Lorenzo, LPN; 09/04/1599 0:93 PM) Colonoscopy 5-10 years ago  Allergies Mammie Lorenzo, LPN; 2/35/5732 2:02 PM) No Known Drug Allergies 08/02/2016 Allergies Reconciled  Medication History Mammie Lorenzo, LPN; 5/42/7062 3:76 PM) Timolol Maleate (0.5% Solution, Ophthalmic) Active. HydroCHLOROthiazide (50MG  Tablet, Oral) Active. Potassium Chloride (10MEQ Tablet ER, Oral) Active. Aspirin (325MG  Tablet DR, Oral) Active. Iron (Ferrous Gluconate) (325MG  Tablet, Oral) Active. Medications Reconciled  Social History Mammie Lorenzo, LPN; 2/83/1517 6:16 PM) Alcohol use Occasional alcohol use. Caffeine use Coffee, Tea. Illicit drug use Prefer to discuss with provider. Tobacco use Current every day smoker.  Family History Mammie Lorenzo, LPN; 0/73/7106 2:69 PM) Arthritis Father, Mother. Colon Polyps Mother. Heart Disease Brother, Father. Hypertension Brother.  Other Problems Mammie Lorenzo, LPN; 4/85/4627 0:35 PM) High blood pressure     Review of Systems Claiborne Billings Dockery LPN; 0/11/3816 2:99 PM) General Not Present- Appetite Loss, Chills, Fatigue, Fever, Night Sweats, Weight Gain and Weight Loss. Skin Present- New Lesions. Not  Present- Change in Wart/Mole, Dryness, Hives, Jaundice, Non-Healing Wounds, Rash and Ulcer. HEENT Present- Seasonal Allergies, Visual Disturbances and Wears glasses/contact lenses. Not Present- Earache, Hearing Loss, Hoarseness, Nose Bleed, Oral Ulcers, Ringing in the Ears, Sinus Pain, Sore Throat and Yellow Eyes. Respiratory Not Present- Bloody sputum, Chronic Cough, Difficulty Breathing, Snoring and Wheezing. Cardiovascular Present- Leg Cramps. Not Present- Chest Pain, Difficulty Breathing Lying Down, Palpitations, Rapid Heart Rate, Shortness of Breath and Swelling of Extremities. Gastrointestinal Present- Rectal Pain. Not Present- Abdominal Pain, Bloating, Bloody Stool, Change in Bowel Habits, Chronic diarrhea, Constipation, Difficulty Swallowing, Excessive gas, Gets full quickly at meals, Hemorrhoids, Indigestion, Nausea and Vomiting. Male Genitourinary Not Present- Blood in Urine, Change in Urinary Stream, Frequency, Impotence, Nocturia, Painful Urination, Urgency and Urine Leakage. Musculoskeletal Present- Swelling of Extremities. Not Present- Back Pain, Joint Pain, Joint Stiffness, Muscle Pain and Muscle Weakness. Neurological Not Present- Decreased Memory, Fainting, Headaches, Numbness, Seizures, Tingling, Tremor, Trouble walking and Weakness. Psychiatric Not Present- Anxiety, Bipolar, Change in Sleep Pattern, Depression, Fearful and Frequent crying. Endocrine Not Present- Cold Intolerance, Excessive Hunger, Hair Changes, Heat Intolerance, Hot flashes and New Diabetes. Hematology Not Present- Blood Thinners, Easy Bruising, Excessive bleeding, Gland problems, HIV and Persistent Infections.  Vitals Claiborne Billings Dockery LPN; 3/71/6967 8:93 PM) 08/02/2016 2:14 PM Weight: 131.8 lb Height: 70in Body Surface Area: 1.75 m Body Mass Index: 18.91 kg/m  Temp.: 97.5F(Oral)  Pulse: 79 (Regular)  BP: 124/70 (Sitting, Left Arm, Standard)      Physical Exam Juan Ruff MD; 10/14/1749 2:29  PM)  General Mental Status-Alert. General Appearance-Not in acute distress. Build & Nutrition-Well nourished. Posture-Normal posture. Gait-Normal.  Head and Neck Head-normocephalic, atraumatic with no lesions or palpable masses. Trachea-midline.  Chest and Lung Exam Chest and lung exam reveals -on auscultation, normal  breath sounds, no adventitious sounds and normal vocal resonance.  Cardiovascular Cardiovascular examination reveals -normal heart sounds, regular rate and rhythm with no murmurs and no digital clubbing, cyanosis, edema, increased warmth or tenderness.  Abdomen Inspection Inspection of the abdomen reveals - No Hernias. Palpation/Percussion Palpation and Percussion of the abdomen reveal - Soft, Non Tender, No Rigidity (guarding), No hepatosplenomegaly and No Palpable abdominal masses.  Rectal Anorectal Exam External - normal external exam. Internal - Note: Slight nodularity palpated at left lateral anal canal. No fixed masses palpated.  Neurologic Neurologic evaluation reveals -alert and oriented x 3 with no impairment of recent or remote memory, normal attention span and ability to concentrate, normal sensation and normal coordination.  Musculoskeletal Normal Exam - Bilateral-Upper Extremity Strength Normal and Lower Extremity Strength Normal.   Results Juan Ruff MD; 1/53/7943 2:31 PM) Procedures  Name Value Date ANOSCOPY, DIAGNOSTIC (27614) [ Hemorrhoids ] Procedure Other: Procedure: Anoscopy Surgeon: Marcello Moores After the risks and benefits were explained, verbal consent was obtained for above procedure. A medical assistant chaperone was present thoroughout the entire procedure. Anesthesia: none Diagnosis: rectal bleeding Findings: grade 3 left lateral internal hemorrhoid  Performed: 08/02/2016 2:31 PM    Assessment & Plan Juan Ruff MD; 09/13/2955 2:31 PM)  PROLAPSED INTERNAL HEMORRHOIDS, GRADE 3 (M73.4) Impression:  61 year old male who presented to the office for evaluation of rectal bleeding. He was seen in the ER and thought to have a fungating anal mass. On physical exam today he appears to have a prolapsed grade 3 left lateral internal hemorrhoid. There are no fixed masses. I have recommended an exam under anesthesia with hemorrhoidectomy. There is some nodularity on the hemorrhoid at the dentate line which I presume is chronic inflammation but cannot rule out AIN.  We discussed that he will most likely need a hemorrhoidectomy and this can cause pain for 3-4 weeks as he heals. I recommend that he stay out of work during this time as he does a rather physical job.

## 2016-09-01 NOTE — Transfer of Care (Signed)
Immediate Anesthesia Transfer of Care Note  Patient: Juan David  Procedure(s) Performed: Procedure(s) (LRB): HEMORRHOIDECTOMY (N/A)  Patient Location: PACU  Anesthesia Type: MAC  Level of Consciousness: awake, alert , oriented and patient cooperative  Airway & Oxygen Therapy: Patient Spontanous Breathing and Patient connected to face mask oxygen  Post-op Assessment: Report given to PACU RN and Post -op Vital signs reviewed and stable  Post vital signs: Reviewed and stable  Complications: No apparent anesthesia complications Last Vitals:  Vitals:   09/01/16 0949 09/01/16 1200  BP: (!) 142/89 129/74  Pulse: 60 65  Resp: 16 10  Temp: 36.8 C 37 C    Last Pain:  Vitals:   09/01/16 1200  TempSrc:   PainSc: Asleep      Patients Stated Pain Goal: 5 (09/01/16 1008)

## 2016-09-01 NOTE — Anesthesia Procedure Notes (Signed)
Procedure Name: MAC Performed by: Denna Haggard D Pre-anesthesia Checklist: Patient identified, Emergency Drugs available, Suction available, Patient being monitored and Timeout performed Patient Re-evaluated:Patient Re-evaluated prior to inductionOxygen Delivery Method: Simple face mask

## 2016-09-02 ENCOUNTER — Encounter (HOSPITAL_BASED_OUTPATIENT_CLINIC_OR_DEPARTMENT_OTHER): Payer: Self-pay | Admitting: General Surgery

## 2016-09-02 ENCOUNTER — Other Ambulatory Visit: Payer: Self-pay | Admitting: General Surgery

## 2016-09-02 DIAGNOSIS — C210 Malignant neoplasm of anus, unspecified: Secondary | ICD-10-CM

## 2016-09-06 ENCOUNTER — Ambulatory Visit
Admission: RE | Admit: 2016-09-06 | Discharge: 2016-09-06 | Disposition: A | Discharge status: Home / Self Care | Payer: 59 | Source: Ambulatory Visit | Attending: General Surgery | Admitting: General Surgery

## 2016-09-06 DIAGNOSIS — K6289 Other specified diseases of anus and rectum: Secondary | ICD-10-CM | POA: Diagnosis not present

## 2016-09-06 DIAGNOSIS — C210 Malignant neoplasm of anus, unspecified: Secondary | ICD-10-CM

## 2016-09-06 DIAGNOSIS — I700 Atherosclerosis of aorta: Secondary | ICD-10-CM | POA: Diagnosis not present

## 2016-09-06 MED ORDER — IOPAMIDOL (ISOVUE-300) INJECTION 61%
100.0000 mL | Freq: Once | INTRAVENOUS | Status: AC | PRN
  Administered 2016-09-06: 100 mL via INTRAVENOUS

## 2016-09-08 MED FILL — TIMOLOL 0.5% EYE DROPS: 0.5 | 90 days supply | Qty: 5 | Fill #1

## 2016-09-14 MED FILL — IBUPROFEN 800 MG TAB: 800 | 7 days supply | Qty: 30 | Fill #0

## 2016-09-14 MED FILL — PENICILLIN VK 500 MG TABLET: 500 | 7 days supply | Qty: 28 | Fill #0

## 2016-09-20 ENCOUNTER — Encounter: Payer: Self-pay | Admitting: Radiation Oncology

## 2016-09-20 ENCOUNTER — Other Ambulatory Visit: Payer: Self-pay | Admitting: General Surgery

## 2016-09-21 ENCOUNTER — Other Ambulatory Visit (HOSPITAL_COMMUNITY): Payer: Self-pay | Admitting: General Surgery

## 2016-09-21 ENCOUNTER — Other Ambulatory Visit: Payer: Self-pay | Admitting: General Surgery

## 2016-09-21 DIAGNOSIS — C20 Malignant neoplasm of rectum: Secondary | ICD-10-CM

## 2016-09-22 ENCOUNTER — Encounter: Payer: Self-pay | Admitting: General Surgery

## 2016-09-22 ENCOUNTER — Ambulatory Visit (HOSPITAL_COMMUNITY)
Admission: RE | Admit: 2016-09-22 | Discharge: 2016-09-22 | Disposition: A | Discharge status: Home / Self Care | Payer: 59 | Source: Ambulatory Visit | Attending: General Surgery | Admitting: General Surgery

## 2016-09-22 ENCOUNTER — Telehealth: Payer: Self-pay | Admitting: General Surgery

## 2016-09-22 DIAGNOSIS — C20 Malignant neoplasm of rectum: Secondary | ICD-10-CM | POA: Insufficient documentation

## 2016-09-22 DIAGNOSIS — C210 Malignant neoplasm of anus, unspecified: Secondary | ICD-10-CM | POA: Diagnosis not present

## 2016-09-22 MED ORDER — GADOBENATE DIMEGLUMINE 529 MG/ML IV SOLN
15.0000 mL | Freq: Once | INTRAVENOUS | Status: AC | PRN
  Administered 2016-09-22: 12 mL via INTRAVENOUS

## 2016-09-22 NOTE — Telephone Encounter (Signed)
Appt has been scheduled for the pt to see Leighton Ruff on 6/59 at 145pm. Pt's wife arrive 30 minutes early. Letter mailed to the pt.

## 2016-09-22 NOTE — Progress Notes (Signed)
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 - TUMOR INVADES INTO ANAL SPHINCTER MUSCLE.- RESECTION MARGINS ARE NEGATIVE  Past/Anticipated interventions by surgeon, if any: Dr. Leighton Ruff, MD 9/50/72,  2/57/50 Single column hemorrhoidectomy,anal  Canal mass Colonoscopy 5-10 years ago  Past/Anticipated interventions by medical oncology, if any: Ned Card, 09/30/16 new appt,   Weight changes, if NXG:ZFPOIP a few lately  Bowel/Bladder complaints, if any: regular bowels, no bleeding, normal bladder  Nausea / Vomiting, if any: NO  Pain issues, if any:  No Any blood per rectum:   NO  SAFETY ISSUES:  Prior radiation? NO  Pacemaker/ICD: NO  Is the patient on methotrexate? NO  Current Complaints/Details:Married, lost son  To motorcycle accident, has 2 stepchildren,  Mother Colon polyps , Father and brother heart disease  Allergies:NKA BP (!) 133/92 Comment: LFA sitting  Pulse 83   Temp 98.9 F (37.2 C) (Oral)   Resp 20   Ht 5' 10.5" (1.791 m)   Wt 135 lb 3.2 oz (61.3 kg)   SpO2 100%   BMI 19.12 kg/m   Wt Readings from Last 3 Encounters:  09/28/16 135 lb 3.2 oz (61.3 kg)  09/01/16 129 lb 8 oz (58.7 kg)  08/20/15 145 lb (65.8 kg)

## 2016-09-27 ENCOUNTER — Telehealth: Payer: Self-pay | Admitting: *Deleted

## 2016-09-27 NOTE — Telephone Encounter (Signed)
Called the patient, asked if he could come in early tomorrow at 1:30pmm for the nurse and to see Juan David and Dr. Lisbeth Renshaw at 2pm, He stated he wouyld be here then , asked that he arrive 15 minutes early to register in the cancer center once he gets a pager to come to the ground floor and wait in the radiation waiting room and I would greet him then,  3:57 PM

## 2016-09-28 ENCOUNTER — Ambulatory Visit
Admission: RE | Admit: 2016-09-28 | Discharge: 2016-09-28 | Disposition: A | Discharge status: Home / Self Care | Payer: 59 | Source: Ambulatory Visit | Attending: Radiation Oncology | Admitting: Radiation Oncology

## 2016-09-28 ENCOUNTER — Encounter: Payer: Self-pay | Admitting: Radiation Oncology

## 2016-09-28 VITALS — BP 133/92 | HR 83 | Temp 98.9°F | Resp 20 | Ht 70.5 in | Wt 135.2 lb

## 2016-09-28 DIAGNOSIS — F17210 Nicotine dependence, cigarettes, uncomplicated: Secondary | ICD-10-CM | POA: Diagnosis not present

## 2016-09-28 DIAGNOSIS — C20 Malignant neoplasm of rectum: Secondary | ICD-10-CM | POA: Insufficient documentation

## 2016-09-28 DIAGNOSIS — Z86718 Personal history of other venous thrombosis and embolism: Secondary | ICD-10-CM | POA: Diagnosis not present

## 2016-09-28 DIAGNOSIS — I10 Essential (primary) hypertension: Secondary | ICD-10-CM | POA: Diagnosis not present

## 2016-09-28 DIAGNOSIS — Z800 Family history of malignant neoplasm of digestive organs: Secondary | ICD-10-CM | POA: Diagnosis not present

## 2016-09-28 DIAGNOSIS — E611 Iron deficiency: Secondary | ICD-10-CM | POA: Insufficient documentation

## 2016-09-28 DIAGNOSIS — Z79899 Other long term (current) drug therapy: Secondary | ICD-10-CM | POA: Insufficient documentation

## 2016-09-28 DIAGNOSIS — C210 Malignant neoplasm of anus, unspecified: Secondary | ICD-10-CM

## 2016-09-28 DIAGNOSIS — Z9889 Other specified postprocedural states: Secondary | ICD-10-CM | POA: Diagnosis not present

## 2016-09-28 DIAGNOSIS — C218 Malignant neoplasm of overlapping sites of rectum, anus and anal canal: Secondary | ICD-10-CM | POA: Diagnosis not present

## 2016-09-28 DIAGNOSIS — Z510 Encounter for antineoplastic radiation therapy: Secondary | ICD-10-CM | POA: Diagnosis not present

## 2016-09-28 DIAGNOSIS — H5462 Unqualified visual loss, left eye, normal vision right eye: Secondary | ICD-10-CM | POA: Insufficient documentation

## 2016-09-28 DIAGNOSIS — Z7982 Long term (current) use of aspirin: Secondary | ICD-10-CM | POA: Diagnosis not present

## 2016-09-28 HISTORY — DX: Malignant neoplasm of rectum: C20

## 2016-09-28 NOTE — Progress Notes (Signed)
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  HISTORY OF PRESENT ILLNESS: Juan David is a 61 y.o. male seen at the request of Dr. Marcello Moores. The patient presented to the ED on 07/26/2016 complaining of rectal mass that had been present for one month and started draining and was bleeding. The patient was referred to [facility]. A hemorrhoidectomy was performed on 09/01/2016 with Dr. Marcello Moores. Pathology of the canal mass revealed invasive adenocarcinoma, well differentiated, spanning 1.3 cm. The tumor invades into anal sphincter muscle. Resection margins were negative. The tumor would best be staged as a pT2 given involvement of muscle. A postop CT C/A/P on 09/06/2016 showed no evidence to suggest metastatic disease in the chest, abdomen, or pelvis. MRI of the pelvis on 09/22/2016 showed no definite residual anal tumor and no evidence of tumor involvement of the external sphincter or adjacent organs. No pelvic lymphadenopathy identified.The patient is here to discuss radiation treatment options in the management of his disease. The patient is scheduled to meet with Dr. Burr Medico in medical oncology this Friday, 7/27.     PREVIOUS RADIATION THERAPY: No   PAST MEDICAL HISTORY:  Past Medical History:  Diagnosis Date  . Arthritis   . Chronic anemia   . History of cardiovascular stress test    per pt in 1980's , told was normal  . History of DVT of lower extremity    post right knee surgery 1998  lower extremitiy  treated w/ coumadin for a year/  per pt no dvt since  . 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 Hypertension   . Iron deficiency   . Legally  blind in left eye, as defined in [place]    per pt only see light  . PFO (patent foramen ovale)    per TEE done 05-11-2009   . Traumatic glaucoma, 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 at [facility] in [place]   08-18-2001  . Wears glasses        PAST SURGICAL HISTORY: Past Surgical History:  Procedure Laterality Date  . ARTHROSCOPIC REPAIR ACL Right 1998  . EXPLORATION AND REPAIR LEFT EYE INJURY  08-18-2001   [facility]   ruptured globe- repair corneal laceration, reposition of prolapsed uveal tissue  . HEMORRHOID SURGERY N/A 09/01/2016   Procedure: HEMORRHOIDECTOMY;  Surgeon: Leighton Ruff, MD;  Location: [facility];  Service: General;  Laterality: N/A;  . SUPERFICIAL KERATECTOMY Left 06-29-2011    [facility]    with EDTA scrub of left eye  . TEE WITHOUT CARDIOVERSION  05-11-2009  dr Dorris Carnes   LVSEF 55-65%/  mild thickened AV without AI/  trace MR/ mixed fixed artherosclerosis plaqueing thoracic aorta/  no evidence thrombus/  by agitated saling and color doppler there was a PFO     FAMILY HISTORY:  Family History  Problem Relation Age of Onset  . Colon cancer Mother      SOCIAL HISTORY:  reports that he has been smoking Cigars and Cigarettes.  He has smoked for the past 30.00 years. He quit smokeless tobacco use about 8 years ago. His smokeless tobacco use included  Snuff. He reports that he drinks alcohol. The patient is married and works for [employer].    ALLERGIES: Patient has no known allergies.   MEDICATIONS:  Current Outpatient Prescriptions  Medication Sig Dispense Refill  . aspirin 325 MG tablet Take 325 mg by mouth daily.    Marland Kitchen docusate sodium (COLACE) 100 MG capsule Take 100 mg by mouth 2 (two) times daily as needed for mild constipation.    . Ferrous Sulfate (IRON) 325 (65 Fe) MG TABS Take 5 tablets by mouth every morning.    . hydrochlorothiazide (HYDRODIURIL) 50 MG tablet Take 25 mg by mouth every morning.   0  .  hypromellose (GENTEAL) 0.3 % GEL ophthalmic ointment     . Multiple Vitamin (MULTI-VITAMINS) TABS Take 1 tablet by mouth daily. Adult chewable    . Potassium 95 MG TABS Take 1 tablet by mouth every morning.    . prednisoLONE acetate (PRED FORTE) 1 % ophthalmic suspension Place 1 drop into the left eye as needed.    . timolol (TIMOPTIC) 0.5 % ophthalmic solution Place 1 drop into the left eye every morning.   11  . naproxen (NAPROSYN) 500 MG tablet Take 1 tablet (500 mg total) by mouth 2 (two) times daily. (Patient not taking: Reported on 09/28/2016) 14 tablet 0  . oxyCODONE (OXY IR/ROXICODONE) 5 MG immediate release tablet Take 1-2 tablets (5-10 mg total) by mouth every 6 (six) hours as needed for severe pain. (Patient not taking: Reported on 09/28/2016) 20 tablet 0   No current facility-administered medications for this encounter.      REVIEW OF SYSTEMS: On review of systems, the patient reports that he is doing well overall. He denies any chest pain, shortness of breath, cough, fevers, chills, night sweats. He reports having gained a few pounds lately. He denies any bowel or bladder disturbances, and denies abdominal pain, nausea or vomiting. He denies rectal bleeding. He denies any new musculoskeletal or joint aches or pains. He states he is only able to see light with his left eye for the last 5 years. He sees an ophthalmologist every six months. A complete review of systems is obtained and is otherwise negative.     PHYSICAL EXAM:  Wt Readings from Last 3 Encounters:  09/28/16 135 lb 3.2 oz (61.3 kg)  09/01/16 129 lb 8 oz (58.7 kg)  08/20/15 145 lb (65.8 kg)   Temp Readings from Last 3 Encounters:  09/28/16 98.9 F (37.2 C) (Oral)  09/01/16 99 F (37.2 C)  08/24/16 98.7 F (37.1 C) (Oral)   BP Readings from Last 3 Encounters:  09/28/16 (!) 133/92  09/01/16 (!) 148/60  08/24/16 (!) 124/93   Pulse Readings from Last 3 Encounters:  09/28/16 83  09/01/16 60  08/24/16 70   Pain  Assessment Pain Score: 0-No pain/10  In general this is a well appearing african [place] male in no acute distress. He is alert and oriented x4 and appropriate throughout the examination. HEENT reveals that the patient is normocephalic, atraumatic. EOMs are intact. Left eye reveals milky white change of the lens. No scleral abnormalities are noted. Right eye has intact PERRLA. Skin is intact without any evidence of gross lesions. Cardiovascular exam reveals a regular rate and rhythm, no clicks rubs or murmurs are auscultated. Chest is clear to auscultation bilaterally. Lymphatic assessment is performed and does not reveal any adenopathy in the cervical, supraclavicular, axillary, or inguinal chains. Abdomen has active bowel sounds in all quadrants and is intact.  The abdomen is soft, non tender, non distended. Lower extremities are negative for pretibial pitting edema, deep calf tenderness, cyanosis or clubbing.    ECOG = 0  0 - Asymptomatic (Fully active, able to carry on all predisease activities without restriction)  1 - Symptomatic but completely ambulatory (Restricted in physically strenuous activity but ambulatory and able to carry out work of a light or sedentary nature. For example, light housework, office work)  2 - Symptomatic, <50% in bed during the day (Ambulatory and capable of all self care but unable to carry out any work activities. Up and about more than 50% of waking hours)  3 - Symptomatic, >50% in bed, but not bedbound (Capable of only limited self-care, confined to bed or chair 50% or more of waking hours)  4 - Bedbound (Completely disabled. Cannot carry on any self-care. Totally confined to bed or chair)  5 - Death   Eustace Pen MM, Creech RH, Tormey DC, et al. [phone]). "Toxicity and response criteria of the [facility]". [place] Oncol. 5 (6): 649-55    LABORATORY DATA:  Lab Results  Component Value Date   HGB 14.3 09/01/2016   HCT 42.0 09/01/2016    Lab Results  Component Value Date   NA 139 09/01/2016   K 4.5 09/01/2016   CL 106 09/01/2016   No results found for: ALT, AST, GGT, ALKPHOS, BILITOT    RADIOGRAPHY: Ct Chest W Contrast  Result Date: 09/06/2016 CLINICAL DATA:  61 year old male with history of anal mass status post surgical resection 5 days ago. EXAM: CT CHEST, ABDOMEN, AND PELVIS WITH CONTRAST TECHNIQUE: Multidetector CT imaging of the chest, abdomen and pelvis was performed following the standard protocol during bolus administration of intravenous contrast. CONTRAST:  166mL ISOVUE-300 IOPAMIDOL (ISOVUE-300) INJECTION 61% COMPARISON:  No priors. FINDINGS: CT CHEST FINDINGS Cardiovascular: Heart size is normal. There is no significant pericardial fluid, thickening or pericardial calcification. There is aortic atherosclerosis, as well as atherosclerosis of the great vessels of the mediastinum and the coronary arteries, including calcified atherosclerotic plaque in the left anterior descending and left circumflex coronary arteries. Mediastinum/Nodes: No pathologically enlarged mediastinal or hilar lymph nodes. Esophagus is unremarkable in appearance. No axillary lymphadenopathy. Lungs/Pleura: Diffuse bronchial wall thickening with mild centrilobular and paraseptal emphysema. No acute consolidative airspace disease. No pleural effusions. No suspicious appearing pulmonary nodules or masses. Musculoskeletal: There are no aggressive appearing lytic or blastic lesions noted in the visualized portions of the skeleton. CT ABDOMEN PELVIS FINDINGS Hepatobiliary: 1 cm low-attenuation lesion in the superior aspect of segment 7 of the liver is compatible with a simple cyst. 3 mm low-attenuation lesion in segment 5 of the liver is too small to definitively characterize, but is statistically likely a tiny cyst. No other suspicious hepatic lesions are noted. No intra or extrahepatic biliary ductal dilatation. Gallbladder is nearly completely contracted,  but otherwise unremarkable in appearance. Pancreas: No pancreatic mass. No pancreatic ductal dilatation. No pancreatic or peripancreatic fluid or inflammatory changes. Spleen: Unremarkable. Adrenals/Urinary Tract: Bilateral adrenal glands and bilateral kidneys are normal in appearance. No hydroureteronephrosis. Urinary bladder is normal in appearance. Stomach/Bowel: The appearance of the stomach is normal. There is no pathologic dilatation of small bowel or colon. Normal appendix. No definite residual anal mass confidently identified on today's CT examination. Vascular/Lymphatic: Aortic atherosclerosis, without evidence of aneurysm or dissection in the abdominal or pelvic vasculature. No lymphadenopathy noted in the abdomen or pelvis. Reproductive: Prostate gland and seminal vesicles are unremarkable in appearance.  Other: No significant volume of ascites.  No pneumoperitoneum. Musculoskeletal: There are no aggressive appearing lytic or blastic lesions noted in the visualized portions of the skeleton. IMPRESSION: 1. No evidence to suggest metastatic disease in the chest, abdomen or pelvis. 2. Aortic atherosclerosis, in addition to 2 vessel coronary artery disease. Please note that although the presence of coronary artery calcium documents the presence of coronary artery disease, the severity of this disease and any potential stenosis cannot be assessed on this non-gated CT examination. Assessment for potential risk factor modification, dietary therapy or pharmacologic therapy may be warranted, if clinically indicated. 3. Additional incidental findings, as above. Electronically Signed   By: Vinnie Langton M.D.   On: 09/06/2016 16:21   Mr Pelvis W Wo Contrast  Result Date: 09/22/2016 CLINICAL DATA:  Newly diagnosed anal carcinoma. Recent surgical excision 3 weeks ago. Staging. EXAM: MRI PELVIS WITHOUT AND WITH CONTRAST TECHNIQUE: Multiplanar multisequence MR imaging of the pelvis was performed both before and  after administration of intravenous contrast. CONTRAST:  52mL MULTIHANCE GADOBENATE DIMEGLUMINE 529 MG/ML IV SOLN COMPARISON:  None. FINDINGS: Urinary Tract: Unremarkable urinary bladder. Bowel: No definite residual mass seen within the anal canal or rectum No evidence of tumor involvement of or extension through the external anal sphincter. No evidence of invasion of other organs. Severe sigmoid diverticulosis noted, without evidence of diverticulitis . Vascular/Lymphatic: No perirectal or other pelvic lymphadenopathy identified . Reproductive:  Normal size prostate and seminal vesicles. Other: None. Musculoskeletal: No significant abnormality identified. IMPRESSION: No definite residual anal tumor identified. No evidence of tumor involvement of the external sphincter or adjacent organs. No pelvic lymphadenopathy. Electronically Signed   By: Earle Gell M.D.   On: 09/22/2016 11:22   Ct Abdomen Pelvis W Contrast  Result Date: 09/06/2016 CLINICAL DATA:  61 year old male with history of anal mass status post surgical resection 5 days ago. EXAM: CT CHEST, ABDOMEN, AND PELVIS WITH CONTRAST TECHNIQUE: Multidetector CT imaging of the chest, abdomen and pelvis was performed following the standard protocol during bolus administration of intravenous contrast. CONTRAST:  155mL ISOVUE-300 IOPAMIDOL (ISOVUE-300) INJECTION 61% COMPARISON:  No priors. FINDINGS: CT CHEST FINDINGS Cardiovascular: Heart size is normal. There is no significant pericardial fluid, thickening or pericardial calcification. There is aortic atherosclerosis, as well as atherosclerosis of the great vessels of the mediastinum and the coronary arteries, including calcified atherosclerotic plaque in the left anterior descending and left circumflex coronary arteries. Mediastinum/Nodes: No pathologically enlarged mediastinal or hilar lymph nodes. Esophagus is unremarkable in appearance. No axillary lymphadenopathy. Lungs/Pleura: Diffuse bronchial wall  thickening with mild centrilobular and paraseptal emphysema. No acute consolidative airspace disease. No pleural effusions. No suspicious appearing pulmonary nodules or masses. Musculoskeletal: There are no aggressive appearing lytic or blastic lesions noted in the visualized portions of the skeleton. CT ABDOMEN PELVIS FINDINGS Hepatobiliary: 1 cm low-attenuation lesion in the superior aspect of segment 7 of the liver is compatible with a simple cyst. 3 mm low-attenuation lesion in segment 5 of the liver is too small to definitively characterize, but is statistically likely a tiny cyst. No other suspicious hepatic lesions are noted. No intra or extrahepatic biliary ductal dilatation. Gallbladder is nearly completely contracted, but otherwise unremarkable in appearance. Pancreas: No pancreatic mass. No pancreatic ductal dilatation. No pancreatic or peripancreatic fluid or inflammatory changes. Spleen: Unremarkable. Adrenals/Urinary Tract: Bilateral adrenal glands and bilateral kidneys are normal in appearance. No hydroureteronephrosis. Urinary bladder is normal in appearance. Stomach/Bowel: The appearance of the stomach is normal. There is no pathologic  dilatation of small bowel or colon. Normal appendix. No definite residual anal mass confidently identified on today's CT examination. Vascular/Lymphatic: Aortic atherosclerosis, without evidence of aneurysm or dissection in the abdominal or pelvic vasculature. No lymphadenopathy noted in the abdomen or pelvis. Reproductive: Prostate gland and seminal vesicles are unremarkable in appearance. Other: No significant volume of ascites.  No pneumoperitoneum. Musculoskeletal: There are no aggressive appearing lytic or blastic lesions noted in the visualized portions of the skeleton. IMPRESSION: 1. No evidence to suggest metastatic disease in the chest, abdomen or pelvis. 2. Aortic atherosclerosis, in addition to 2 vessel coronary artery disease. Please note that although the  presence of coronary artery calcium documents the presence of coronary artery disease, the severity of this disease and any potential stenosis cannot be assessed on this non-gated CT examination. Assessment for potential risk factor modification, dietary therapy or pharmacologic therapy may be warranted, if clinically indicated. 3. Additional incidental findings, as above. Electronically Signed   By: Vinnie Langton M.D.   On: 09/06/2016 16:21       IMPRESSION/PLAN: 1. 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 Dr. Lisbeth Renshaw discusses the findings and work-up thus far. We discussed the natural history of adenocarcinoma of the distal rectum/anus in the postop setting, and general treatment, highlighting the role of radiotherapy in the management.  We discussed the available radiation techniques, and focused on the details of logistics and delivery of a 6 week treatment with concurrent chemotherapy. The patient would like to proceed with radiation and will be return tomorrow for simulation. Written consent is obtained and placed in the chart, a copy was provided to the patient. 2. Possible genetic predisposition to cancer. The patient will be referred to genetics given his family and personal history.   The above documentation reflects my direct findings during this shared patient visit. Please see the separate note by Dr. Lisbeth Renshaw on this date for the remainder of the patient's plan of care.    Carola Rhine, PAC  This document serves as a record of services personally performed by Kyung Rudd, MD and Shona Simpson, PA-C. It was created on their behalf by Arlyce Harman, a trained medical scribe. The creation of this record is based on the scribe's personal observations and the provider's statements to them. This document has been checked and approved by the attending provider.

## 2016-09-28 NOTE — Progress Notes (Signed)
Please see the Nurse Progress Note in the MD Initial Consult Encounter for this patient. 

## 2016-09-29 ENCOUNTER — Ambulatory Visit: Admission: RE | Admit: 2016-09-29 | Payer: 59 | Source: Ambulatory Visit | Admitting: Radiation Oncology

## 2016-09-30 ENCOUNTER — Ambulatory Visit: Payer: 59 | Admitting: Nurse Practitioner

## 2016-09-30 ENCOUNTER — Ambulatory Visit (HOSPITAL_BASED_OUTPATIENT_CLINIC_OR_DEPARTMENT_OTHER): Payer: 59 | Admitting: Nurse Practitioner

## 2016-09-30 ENCOUNTER — Ambulatory Visit
Admission: RE | Admit: 2016-09-30 | Discharge: 2016-09-30 | Disposition: A | Discharge status: Home / Self Care | Payer: 59 | Source: Ambulatory Visit | Attending: Radiation Oncology | Admitting: Radiation Oncology

## 2016-09-30 ENCOUNTER — Telehealth: Payer: Self-pay | Admitting: Hematology

## 2016-09-30 VITALS — BP 141/89 | HR 84 | Temp 98.6°F | Resp 20 | Ht 70.5 in | Wt 135.1 lb

## 2016-09-30 DIAGNOSIS — Z79899 Other long term (current) drug therapy: Secondary | ICD-10-CM | POA: Diagnosis not present

## 2016-09-30 DIAGNOSIS — K625 Hemorrhage of anus and rectum: Secondary | ICD-10-CM | POA: Diagnosis not present

## 2016-09-30 DIAGNOSIS — D649 Anemia, unspecified: Secondary | ICD-10-CM | POA: Diagnosis not present

## 2016-09-30 DIAGNOSIS — F17210 Nicotine dependence, cigarettes, uncomplicated: Secondary | ICD-10-CM | POA: Diagnosis not present

## 2016-09-30 DIAGNOSIS — I10 Essential (primary) hypertension: Secondary | ICD-10-CM | POA: Diagnosis not present

## 2016-09-30 DIAGNOSIS — Z86718 Personal history of other venous thrombosis and embolism: Secondary | ICD-10-CM | POA: Diagnosis not present

## 2016-09-30 DIAGNOSIS — C20 Malignant neoplasm of rectum: Secondary | ICD-10-CM

## 2016-09-30 DIAGNOSIS — E611 Iron deficiency: Secondary | ICD-10-CM | POA: Diagnosis not present

## 2016-09-30 DIAGNOSIS — Z510 Encounter for antineoplastic radiation therapy: Secondary | ICD-10-CM | POA: Diagnosis not present

## 2016-09-30 DIAGNOSIS — Z7982 Long term (current) use of aspirin: Secondary | ICD-10-CM | POA: Diagnosis not present

## 2016-09-30 DIAGNOSIS — H5462 Unqualified visual loss, left eye, normal vision right eye: Secondary | ICD-10-CM | POA: Diagnosis not present

## 2016-09-30 NOTE — Progress Notes (Addendum)
[facility]  [phone] Fax:([phone]) [place] Note   Patient Care Team: Leonard Downing, MD as PCP - General (Family Medicine) 09/30/2016  CHIEF COMPLAINTS/PURPOSE OF CONSULTATION:  Newly diagnosed rectal cancer  HISTORY OF PRESENTING ILLNESS:  Juan David 61 y.o. male is here because of recently diagnosed rectal cancer. He reports a 6 month history of an anal mass that progressively increased in size. He presented to the emergency room on 07/26/2016 with rectal bleeding. He was noted to have a "fungating rectal mass" with associated bleeding. He was referred to Dr. Leighton Ruff. He was taken to the operating room on 09/01/2016. He was noted to have a mobile right lateral anal canal mass. Hemorrhoidectomy was performed. Pathology showed invasive adenocarcinoma, well-differentiated, spanning 1.3 cm. The tumor invaded into the anal sphincter muscle. Resection margins were negative. The pathologist notes the tumor would be best staged as pT2 given involvement of muscle. CT scans chest/abdomen/pelvis 09/06/2016 showed no evidence of metastatic disease. Pelvic MRI 09/22/2016 showed no definite residual anal tumor. There was no evidence of tumor involvement of the external sphincter or adjacent organs. There was no pelvic lymphadenopathy. He has seen Dr. Lisbeth Renshaw, radiation oncology, with a recommendation for adjuvant chemoradiation. He is scheduled for simulation later today. The plan is to begin radiation on 10/10/2016.  MEDICAL HISTORY:  Past Medical History:  Diagnosis Date  . Arthritis   . Chronic anemia   . History of cardiovascular stress test    per pt in 1980's , told was normal  . History of DVT of lower extremity    post right knee surgery 1998  lower extremitiy  treated w/ coumadin for a year/  per pt no dvt since  . 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 Hypertension   .  Iron deficiency   . Legally blind in left eye, as defined in [place]    per pt only see light  . PFO (patent foramen ovale)    per TEE done 05-11-2009   . Traumatic glaucoma, 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 at [facility] in [place]   08-18-2001  . Wears glasses     SURGICAL HISTORY: Past Surgical History:  Procedure Laterality Date  . ARTHROSCOPIC REPAIR ACL Right 1998  . EXPLORATION AND REPAIR LEFT EYE INJURY  08-18-2001   [facility]   ruptured globe- repair corneal laceration, reposition of prolapsed uveal tissue  . HEMORRHOID SURGERY N/A 09/01/2016   Procedure: HEMORRHOIDECTOMY;  Surgeon: Leighton Ruff, MD;  Location: [facility];  Service: General;  Laterality: N/A;  . SUPERFICIAL KERATECTOMY Left 06-29-2011    [facility]    with EDTA scrub of left eye  . TEE WITHOUT CARDIOVERSION  05-11-2009  dr Dorris Carnes   LVSEF 55-65%/  mild thickened AV without AI/  trace MR/ mixed fixed artherosclerosis plaqueing thoracic aorta/  no evidence thrombus/  by agitated saling and color doppler there was a PFO    SOCIAL HISTORY: He lives in [place], [place]. He is married. Son deceased at age 84 as a result of a motor vehicle accident. He is employed as a Presenter, broadcasting. Prior to the rectal cancer diagnosis he was smoking 3 cigars a day. He is trying to quit. He estimates drinking 2 beers a week.  FAMILY HISTORY: Family History  Problem Relation Age of Onset  . Colon cancer Mother  Patient reports mother died at age 38 of "old age". He reports she had a history of colon polyps. He is not aware of a diagnosis of colon cancer.  ALLERGIES:  has No Known Allergies.  MEDICATIONS:  Current Outpatient Prescriptions  Medication Sig Dispense Refill  . aspirin 325 MG tablet Take 325 mg by mouth daily.    Marland Kitchen docusate sodium (COLACE) 100 MG capsule Take 100 mg by mouth 2 (two) times daily as needed for mild constipation.    . Ferrous Sulfate (IRON) 325  (65 Fe) MG TABS Take 5 tablets by mouth every morning.    . hydrochlorothiazide (HYDRODIURIL) 50 MG tablet Take 25 mg by mouth every morning.   0  . hypromellose (GENTEAL) 0.3 % GEL ophthalmic ointment     . Multiple Vitamin (MULTI-VITAMINS) TABS Take 1 tablet by mouth daily. Adult chewable    . Potassium 95 MG TABS Take 1 tablet by mouth every morning.    . prednisoLONE acetate (PRED FORTE) 1 % ophthalmic suspension Place 1 drop into the left eye as needed.    . timolol (TIMOPTIC) 0.5 % ophthalmic solution Place 1 drop into the left eye every morning.   11  . oxyCODONE (OXY IR/ROXICODONE) 5 MG immediate release tablet Take 1-2 tablets (5-10 mg total) by mouth every 6 (six) hours as needed for severe pain. (Patient not taking: Reported on 09/28/2016) 20 tablet 0   No current facility-administered medications for this visit.     REVIEW OF SYSTEMS:   Constitutional: Denies fevers, chills or abnormal night sweats Eyes: History of traumatic left eye injury. He is able to see light but no objects. Vision in the right eye is normal. Ears, nose, mouth, throat, and face: Denies mucositis or sore throat Respiratory: Denies cough, dyspnea or wheezes Cardiovascular: Denies palpitation, chest discomfort or lower extremity swelling Gastrointestinal:  Denies nausea. No difficulty with bowel movements following surgery. Skin: Denies abnormal skin rashes Lymphatics: Denies new lymphadenopathy or easy bruising Neurological:Denies numbness, tingling or new weaknesses Behavioral/Psych: Mood is stable, no new changes  All other systems were reviewed with the patient and are negative.  PHYSICAL EXAMINATION: ECOG PERFORMANCE STATUS: 0  Vitals:   09/30/16 1022  BP: (!) 141/89  Pulse: 84  Resp: 20  Temp: 98.6 F (37 C)   Filed Weights   09/30/16 1022  Weight: 135 lb 1.6 oz (61.3 kg)    GENERAL:alert, no distress and comfortable SKIN: skin color, texture, turgor are normal, no rashes or significant  lesions EYES: Left eye has a cloudy appearance. Right eye appears normal. OROPHARYNX:no exudate, no erythema and lips, buccal mucosa, and tongue normal  NECK: supple, thyroid normal size, non-tender, without nodularity LYMPH:  no palpable lymphadenopathy in the cervical, axillary or inguinal regions LUNGS: clear to auscultation and percussion with normal breathing effort HEART: regular rate & rhythm and no murmurs and no lower extremity edema ABDOMEN:abdomen soft, non-tender and normal bowel sounds. No hepatosplenomegaly. Musculoskeletal:no cyanosis of digits and no clubbing  PSYCH: alert & oriented x 3 with fluent speech NEURO: no focal motor/sensory deficits  LABORATORY DATA:  I have reviewed the data as listed CBC Latest Ref Rng & Units 09/01/2016  Hemoglobin 13.0 - 17.0 g/dL 14.3  Hematocrit 39.0 - 52.0 % 42.0   CMP Latest Ref Rng & Units 09/01/2016  Glucose 65 - 99 mg/dL 88  BUN 6 - 20 mg/dL 19  Creatinine 0.61 - 1.24 mg/dL 0.80  Sodium 135 - 145 mmol/L 139  Potassium 3.5 -  5.1 mmol/L 4.5  Chloride 101 - 111 mmol/L 106   PATHOLOGY REPORT: Diagnosis 09/01/2016 Anus, biopsy, canal mass - INVASIVE ADENOCARCINOMA, WELL DIFFERENTIATED, SPANNING 1.3 CM. - TUMOR INVADES INTO ANAL SPHINCTER MUSCLE. - RESECTION MARGINS ARE NEGATIVE. Microscopic Comment Dr. Melina Copa has reviewed the case. Dr. Marcello Moores was paged on 09/02/2016. ADDITIONAL INFORMATION: The tumor would best be staged as a pT2 given involvement of muscle.    RADIOGRAPHIC STUDIES:  Ct Chest W Contrast  Result Date: 09/06/2016 CLINICAL DATA:  61 year old male with history of anal mass status post surgical resection 5 days ago. EXAM: CT CHEST, ABDOMEN, AND PELVIS WITH CONTRAST TECHNIQUE: Multidetector CT imaging of the chest, abdomen and pelvis was performed following the standard protocol during bolus administration of intravenous contrast. CONTRAST:  172mL ISOVUE-300 IOPAMIDOL (ISOVUE-300) INJECTION 61% COMPARISON:  No priors.  FINDINGS: CT CHEST FINDINGS Cardiovascular: Heart size is normal. There is no significant pericardial fluid, thickening or pericardial calcification. There is aortic atherosclerosis, as well as atherosclerosis of the great vessels of the mediastinum and the coronary arteries, including calcified atherosclerotic plaque in the left anterior descending and left circumflex coronary arteries. Mediastinum/Nodes: No pathologically enlarged mediastinal or hilar lymph nodes. Esophagus is unremarkable in appearance. No axillary lymphadenopathy. Lungs/Pleura: Diffuse bronchial wall thickening with mild centrilobular and paraseptal emphysema. No acute consolidative airspace disease. No pleural effusions. No suspicious appearing pulmonary nodules or masses. Musculoskeletal: There are no aggressive appearing lytic or blastic lesions noted in the visualized portions of the skeleton. CT ABDOMEN PELVIS FINDINGS Hepatobiliary: 1 cm low-attenuation lesion in the superior aspect of segment 7 of the liver is compatible with a simple cyst. 3 mm low-attenuation lesion in segment 5 of the liver is too small to definitively characterize, but is statistically likely a tiny cyst. No other suspicious hepatic lesions are noted. No intra or extrahepatic biliary ductal dilatation. Gallbladder is nearly completely contracted, but otherwise unremarkable in appearance. Pancreas: No pancreatic mass. No pancreatic ductal dilatation. No pancreatic or peripancreatic fluid or inflammatory changes. Spleen: Unremarkable. Adrenals/Urinary Tract: Bilateral adrenal glands and bilateral kidneys are normal in appearance. No hydroureteronephrosis. Urinary bladder is normal in appearance. Stomach/Bowel: The appearance of the stomach is normal. There is no pathologic dilatation of small bowel or colon. Normal appendix. No definite residual anal mass confidently identified on today's CT examination. Vascular/Lymphatic: Aortic atherosclerosis, without evidence of  aneurysm or dissection in the abdominal or pelvic vasculature. No lymphadenopathy noted in the abdomen or pelvis. Reproductive: Prostate gland and seminal vesicles are unremarkable in appearance. Other: No significant volume of ascites.  No pneumoperitoneum. Musculoskeletal: There are no aggressive appearing lytic or blastic lesions noted in the visualized portions of the skeleton. IMPRESSION: 1. No evidence to suggest metastatic disease in the chest, abdomen or pelvis. 2. Aortic atherosclerosis, in addition to 2 vessel coronary artery disease. Please note that although the presence of coronary artery calcium documents the presence of coronary artery disease, the severity of this disease and any potential stenosis cannot be assessed on this non-gated CT examination. Assessment for potential risk factor modification, dietary therapy or pharmacologic therapy may be warranted, if clinically indicated. 3. Additional incidental findings, as above. Electronically Signed   By: Vinnie Langton M.D.   On: 09/06/2016 16:21   Mr Pelvis W Wo Contrast  Result Date: 09/22/2016 CLINICAL DATA:  Newly diagnosed anal carcinoma. Recent surgical excision 3 weeks ago. Staging. EXAM: MRI PELVIS WITHOUT AND WITH CONTRAST TECHNIQUE: Multiplanar multisequence MR imaging of the pelvis was performed both before and after  administration of intravenous contrast. CONTRAST:  24mL MULTIHANCE GADOBENATE DIMEGLUMINE 529 MG/ML IV SOLN COMPARISON:  None. FINDINGS: Urinary Tract: Unremarkable urinary bladder. Bowel: No definite residual mass seen within the anal canal or rectum No evidence of tumor involvement of or extension through the external anal sphincter. No evidence of invasion of other organs. Severe sigmoid diverticulosis noted, without evidence of diverticulitis . Vascular/Lymphatic: No perirectal or other pelvic lymphadenopathy identified . Reproductive:  Normal size prostate and seminal vesicles. Other: None. Musculoskeletal: No  significant abnormality identified. IMPRESSION: No definite residual anal tumor identified. No evidence of tumor involvement of the external sphincter or adjacent organs. No pelvic lymphadenopathy. Electronically Signed   By: Earle Gell M.D.   On: 09/22/2016 11:22   Ct Abdomen Pelvis W Contrast  Result Date: 09/06/2016 CLINICAL DATA:  61 year old male with history of anal mass status post surgical resection 5 days ago. EXAM: CT CHEST, ABDOMEN, AND PELVIS WITH CONTRAST TECHNIQUE: Multidetector CT imaging of the chest, abdomen and pelvis was performed following the standard protocol during bolus administration of intravenous contrast. CONTRAST:  131mL ISOVUE-300 IOPAMIDOL (ISOVUE-300) INJECTION 61% COMPARISON:  No priors. FINDINGS: CT CHEST FINDINGS Cardiovascular: Heart size is normal. There is no significant pericardial fluid, thickening or pericardial calcification. There is aortic atherosclerosis, as well as atherosclerosis of the great vessels of the mediastinum and the coronary arteries, including calcified atherosclerotic plaque in the left anterior descending and left circumflex coronary arteries. Mediastinum/Nodes: No pathologically enlarged mediastinal or hilar lymph nodes. Esophagus is unremarkable in appearance. No axillary lymphadenopathy. Lungs/Pleura: Diffuse bronchial wall thickening with mild centrilobular and paraseptal emphysema. No acute consolidative airspace disease. No pleural effusions. No suspicious appearing pulmonary nodules or masses. Musculoskeletal: There are no aggressive appearing lytic or blastic lesions noted in the visualized portions of the skeleton. CT ABDOMEN PELVIS FINDINGS Hepatobiliary: 1 cm low-attenuation lesion in the superior aspect of segment 7 of the liver is compatible with a simple cyst. 3 mm low-attenuation lesion in segment 5 of the liver is too small to definitively characterize, but is statistically likely a tiny cyst. No other suspicious hepatic lesions are  noted. No intra or extrahepatic biliary ductal dilatation. Gallbladder is nearly completely contracted, but otherwise unremarkable in appearance. Pancreas: No pancreatic mass. No pancreatic ductal dilatation. No pancreatic or peripancreatic fluid or inflammatory changes. Spleen: Unremarkable. Adrenals/Urinary Tract: Bilateral adrenal glands and bilateral kidneys are normal in appearance. No hydroureteronephrosis. Urinary bladder is normal in appearance. Stomach/Bowel: The appearance of the stomach is normal. There is no pathologic dilatation of small bowel or colon. Normal appendix. No definite residual anal mass confidently identified on today's CT examination. Vascular/Lymphatic: Aortic atherosclerosis, without evidence of aneurysm or dissection in the abdominal or pelvic vasculature. No lymphadenopathy noted in the abdomen or pelvis. Reproductive: Prostate gland and seminal vesicles are unremarkable in appearance. Other: No significant volume of ascites.  No pneumoperitoneum. Musculoskeletal: There are no aggressive appearing lytic or blastic lesions noted in the visualized portions of the skeleton. IMPRESSION: 1. No evidence to suggest metastatic disease in the chest, abdomen or pelvis. 2. Aortic atherosclerosis, in addition to 2 vessel coronary artery disease. Please note that although the presence of coronary artery calcium documents the presence of coronary artery disease, the severity of this disease and any potential stenosis cannot be assessed on this non-gated CT examination. Assessment for potential risk factor modification, dietary therapy or pharmacologic therapy may be warranted, if clinically indicated. 3. Additional incidental findings, as above. Electronically Signed   By: Vinnie Langton  M.D.   On: 09/06/2016 16:21    ASSESSMENT & PLAN: 61 y.o. AAM   1. 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 Tumor invades into anal sphincter muscle. Resection margins negative. Staged as pT2 given muscle involvement. 2. Rectal bleeding secondary to #1. 3. Hypertension. 4. History of traumatic left eye injury. 5. Hypertension. 6. History of anemia. 7. History of lower extremity DVT following knee surgery 1998.  Mr. Chriscoe has been diagnosed with adenocarcinoma of the distal rectum status post local excision. He has decided against undergoing standard resection. He would like to proceed with adjuvant radiation/chemotherapy. Dr. Burr Medico recommends Xeloda given concurrently with radiation. We reviewed potential toxicities associated with Xeloda including nausea, mouth sores, diarrhea, hand-foot syndrome, skin hyperpigmentation, skin rash, increased sensitivity to sun, conjunctivitis, coronary vasospasm. He is agreeable to proceed.  He is scheduled for radiation simulation later today. The plan is to begin the course of radiation on 10/10/2016. He understands he should begin taking Xeloda on the first day of radiation.  We will obtain baseline labs next week with plans for weekly labs thereafter. He will be seen every other week during the course of treatment.  He has not had a colonoscopy in his estimation in 10 years. We are contacting Dr. Arelia Sneddon office to determine when and where the last colonoscopy was performed. Dr. Burr Medico would like for him to have a colonoscopy next week if possible prior to beginning the course of treatment.  We will see him in follow-up on the first day of radiation, 10/10/2016.  Patient seen with Dr. Burr Medico.     Ned Card, NP 09/30/2016 10:53 AM  Addendum I have seen the patient, examined him. I agree with the assessment and and plan and have edited the notes.   We have reviewed his imaging and pathology findings in our GI tumor board earlier this week. He has pT2cN0M0 stage I low rectal adenocarcinoma, status post transanual resection  with negative margins. Patient has refused APR, which is standard surgery for T2 disease.  Per [facility] guildline, we recommend concurrent adjuvant chemotherapy and irradiation. I recommend Xeloda 8 25 mg/m twice daily with concurrent radiation. Potential benefits and side effects discussed with the patient, he agrees to proceed. The goal of therapy is curative. We'll arrange weekly lab, and follow-up every other week during his treatment. I'll send a prescription of Xeloda 1500mg  bid # 90 with 1 refill to [facility] pharmacy today.   We discussed risk of cancer recurrence after treatment. He will be followed for cancer surveillance after he completes chemotherapy and radiation.   Truitt Merle  10/01/2016

## 2016-09-30 NOTE — Telephone Encounter (Signed)
Gave patient avs report and appointments for July thru September.  °

## 2016-10-01 ENCOUNTER — Encounter: Payer: Self-pay | Admitting: Nurse Practitioner

## 2016-10-01 MED ORDER — CAPECITABINE 500 MG PO TABS: 825.0000 mg/m2 | ORAL_TABLET | Freq: Two times a day (BID) | ORAL | 1 refills | Status: DC

## 2016-10-01 NOTE — Progress Notes (Signed)
  Radiation Oncology         [phone] ________________________________  Name: Juan David MRN: [id number]  Date: 09/30/2016  DOB: [birth year]   SIMULATION AND TREATMENT PLANNING NOTE  DIAGNOSIS:     ICD-10-CM   1. Rectal adenocarcinoma ([place]) C20      The patient presented for simulation for the patient's upcoming course of radiation for the diagnosis of rectal cancer. The patient was placed in a supine position. A customized vac-lock bag was constructed to aid in patient immobilization on. This complex treatment device will be used on a daily basis during the treatment. In this fashion a CT scan was obtained through the pelvic region and the isocenter was placed near midline within the pelvis. Surface markings were placed.  The patient's imaging was loaded into the radiation treatment planning system. The patient will initially be planned to receive a course of radiation to a dose of 45 Gy. This will be accomplished in 25 fractions at 1.8 gray per fraction. This initial treatment will correspond to a 3-D conformal technique. The target has been contoured in addition to the rectum, bladder and femoral heads. Dose volume histograms of each of these structures have been requested and these will be carefully reviewed as part of the 3-D conformal treatment planning process. To accomplish this initial treatment, 3 customized blocks have been designed for this purpose. Each of these 3 complex treatment devices will be used on a daily basis during the initial course of the treatment. It is anticipated that the patient will then receive a boost for an additional 5.4 Gy. The anticipated total dose therefore will be 50.4 Gy.    Special treatment procedure The patient will receive chemotherapy during the course of radiation treatment. The patient may experience increased or overlapping toxicity due to this combined-modality approach and the patient will be monitored for such problems. This may  include extra lab work as necessary. This therefore constitutes a special treatment procedure.    ________________________________  Jodelle Gross, MD, PhD

## 2016-10-03 ENCOUNTER — Telehealth: Payer: Self-pay | Admitting: Pharmacy Technician

## 2016-10-03 ENCOUNTER — Telehealth: Payer: Self-pay | Admitting: Hematology

## 2016-10-03 ENCOUNTER — Other Ambulatory Visit: Payer: 59

## 2016-10-03 ENCOUNTER — Other Ambulatory Visit: Payer: Self-pay | Admitting: Hematology

## 2016-10-03 MED ORDER — CAPECITABINE 500 MG PO TABS: 825.0000 mg/m2 | ORAL_TABLET | Freq: Two times a day (BID) | ORAL | 1 refills | Status: DC

## 2016-10-03 NOTE — Telephone Encounter (Signed)
Oral Oncology Patient Advocate Encounter  Received notification from [place] that prior authorization for Xeloda is required.  PA submitted on CoverMyMeds Key [id number] Status is pending  Oral Oncology Clinic will continue to follow.  [name]. [name], [name] [facility] Patient Advocate [phone] 10/03/2016 11:30 AM

## 2016-10-03 NOTE — Telephone Encounter (Signed)
sw pt to confirm appt with Dr Penelope Coop at [facility] for 7/31 at 1245 per sch msg

## 2016-10-04 DIAGNOSIS — C20 Malignant neoplasm of rectum: Secondary | ICD-10-CM | POA: Diagnosis not present

## 2016-10-05 ENCOUNTER — Telehealth: Payer: Self-pay | Admitting: Pharmacist

## 2016-10-05 DIAGNOSIS — C20 Malignant neoplasm of rectum: Secondary | ICD-10-CM

## 2016-10-05 MED ORDER — CAPECITABINE 500 MG PO TABS: 825.0000 mg/m2 | ORAL_TABLET | Freq: Two times a day (BID) | ORAL | 0 refills | Status: DC

## 2016-10-05 NOTE — Telephone Encounter (Signed)
Oral Oncology Pharmacist Encounter  Received new prescription for Xeloda for the treatment of rectal adenocarcinoma in conjunction with radiation, planned duration 6 weeks.  BMET and Hgb from 09/01/16 assessed, OK for treatment. No liver function panel available to assess, no cause to assume dysfunction  Current medication list in Epic reviewed, no DDIs with Xeloda identified.  Prescription has been e-scribed to the [facility] for benefits analysis and approval. Prior authorization was submitted to patient's insurance on 10/03/16, status is pending.  I spoke with patient for overview of new oral chemotherapy medication: Xeloda  Planned start date: 10/10/16  Counseled patient on administration, dosing, side effects, safe handling, and monitoring. Patient will take Xeloda 500mg  tablets, 3 tablets (1500mg ) by mouth 2 times daily within 30 minutes of AM and PM meals. Patient will take his Xeloda on the days of radiation only.  Side effects include but not limited to: fatigue, GI upset, diarrhea, decreased blood counts, and hand-foot syndrome.    Reviewed with patient importance of keeping a medication schedule and plan for any missed doses.  Mr. Coats voiced understanding and appreciation.   All questions answered.  Will follow up with patient regarding copayment and medication acquisition.   Patient knows to call the office with questions or concerns. Oral Oncology Clinic will continue to follow.  Thank you,  Johny Drilling, PharmD, BCPS, BCOP 10/05/2016  12:09 PM Oral Oncology Clinic [phone]

## 2016-10-06 ENCOUNTER — Other Ambulatory Visit (HOSPITAL_BASED_OUTPATIENT_CLINIC_OR_DEPARTMENT_OTHER): Payer: 59

## 2016-10-06 DIAGNOSIS — C20 Malignant neoplasm of rectum: Secondary | ICD-10-CM | POA: Diagnosis not present

## 2016-10-06 LAB — COMPREHENSIVE METABOLIC PANEL
ALBUMIN: 4.2 g/dL (ref 3.5–5.0)
ALK PHOS: 96 U/L (ref 40–150)
ALT: 21 U/L (ref 0–55)
ANION GAP: 8 meq/L (ref 3–11)
AST: 22 U/L (ref 5–34)
BILIRUBIN TOTAL: 0.32 mg/dL (ref 0.20–1.20)
BUN: 13.4 mg/dL (ref 7.0–26.0)
CALCIUM: 9.9 mg/dL (ref 8.4–10.4)
CO2: 26 meq/L (ref 22–29)
CREATININE: 1 mg/dL (ref 0.7–1.3)
Chloride: 105 mEq/L (ref 98–109)
EGFR: 90 mL/min/{1.73_m2} — ABNORMAL LOW (ref >90)
Glucose: 97 mg/dl (ref 70–140)
Potassium: 4 mEq/L (ref 3.5–5.1)
Sodium: 139 mEq/L (ref 136–145)
TOTAL PROTEIN: 7.7 g/dL (ref 6.4–8.3)

## 2016-10-06 LAB — CBC WITH DIFFERENTIAL/PLATELET
BASO%: 0.2 % (ref 0.0–2.0)
Basophils Absolute: 0 10*3/uL (ref 0.0–0.1)
EOS%: 1.6 % (ref 0.0–7.0)
Eosinophils Absolute: 0.2 10*3/uL (ref 0.0–0.5)
HEMATOCRIT: 40.1 % (ref 38.4–49.9)
HEMOGLOBIN: 13.3 g/dL (ref 13.0–17.1)
LYMPH#: 2.9 10*3/uL (ref 0.9–3.3)
LYMPH%: 31 % (ref 14.0–49.0)
MCH: 29 pg (ref 27.2–33.4)
MCHC: 33.2 g/dL (ref 32.0–36.0)
MCV: 87.6 fL (ref 79.3–98.0)
MONO#: 0.6 10*3/uL (ref 0.1–0.9)
MONO%: 6 % (ref 0.0–14.0)
NEUT%: 61.2 % (ref 39.0–75.0)
NEUTROS ABS: 5.7 10*3/uL (ref 1.5–6.5)
Platelets: 247 10*3/uL (ref 140–400)
RBC: 4.58 10*6/uL (ref 4.20–5.82)
RDW: 14.3 % (ref 11.0–14.6)
WBC: 9.3 10*3/uL (ref 4.0–10.3)

## 2016-10-06 LAB — CEA (IN HOUSE-CHCC): CEA (CHCC-IN HOUSE): 2.8 ng/mL (ref 0.00–5.00)

## 2016-10-06 MED FILL — CAPECITABINE 500 MG TABLET: 500 | 18 days supply | Qty: 112 | Fill #0

## 2016-10-06 NOTE — Telephone Encounter (Signed)
Oral Oncology Patient Advocate Encounter  Prior Authorization for Xeloda has been approved.    PA# [id number] Effective dates: 10/05/2016 through 10/05/2017  Patient's insurance limits the prescription to a 21 day supply.  His copayment for #112 tablets is $152.03.  The copay for the remaning 56 tablets will be $76.76.  I have left the patient a message to discuss copayments and pickup/shipment information.    Oral Oncology Clinic will continue to follow.   [name]. [name], [place] Oral Oncology Patient Advocate [phone] 10/06/2016 11:59 AM

## 2016-10-07 ENCOUNTER — Telehealth: Payer: Self-pay | Admitting: *Deleted

## 2016-10-07 DIAGNOSIS — F17210 Nicotine dependence, cigarettes, uncomplicated: Secondary | ICD-10-CM | POA: Diagnosis not present

## 2016-10-07 DIAGNOSIS — C20 Malignant neoplasm of rectum: Secondary | ICD-10-CM | POA: Diagnosis not present

## 2016-10-07 DIAGNOSIS — Z510 Encounter for antineoplastic radiation therapy: Secondary | ICD-10-CM | POA: Diagnosis not present

## 2016-10-07 DIAGNOSIS — Z86718 Personal history of other venous thrombosis and embolism: Secondary | ICD-10-CM | POA: Diagnosis not present

## 2016-10-07 DIAGNOSIS — H5462 Unqualified visual loss, left eye, normal vision right eye: Secondary | ICD-10-CM | POA: Diagnosis not present

## 2016-10-07 DIAGNOSIS — Z7982 Long term (current) use of aspirin: Secondary | ICD-10-CM | POA: Diagnosis not present

## 2016-10-07 DIAGNOSIS — I10 Essential (primary) hypertension: Secondary | ICD-10-CM | POA: Diagnosis not present

## 2016-10-07 DIAGNOSIS — E611 Iron deficiency: Secondary | ICD-10-CM | POA: Diagnosis not present

## 2016-10-07 DIAGNOSIS — Z79899 Other long term (current) drug therapy: Secondary | ICD-10-CM | POA: Diagnosis not present

## 2016-10-07 NOTE — Progress Notes (Signed)
[place]  [phone] [phone]  Clinic Follow up Note   Patient Care Team: Leonard Downing, MD as PCP - General (Family Medicine) Leighton Ruff, MD as Consulting Physician (General Surgery) Kyung Rudd, MD as Consulting Physician (Radiation Oncology) Truitt Merle, MD as Consulting Physician (Hematology) 10/10/2016  Chief Complaint: Follow up rectal cancer  SUMMARY OF ONCOLOGIC HISTORY:   Rectal adenocarcinoma ([place])   09/01/2016 Initial Diagnosis    Rectal adenocarcinoma ([place])      09/01/2016 Surgery    Hemorrhoidectomy 09/01/2016. Noted to have a mobile right lateral anal canal mass. Dr. Leighton Ruff.       09/01/2016 Pathology Results    Invasive adenocarcinoma, well-differentiated, spanning 1.3 cm. The tumor invaded into the anal sphincter muscle. Resection margins were negative. The pathologist notes the tumor would be best staged as pT2 given involvement of muscle.       09/06/2016 Imaging    CT scans chest/abdomen/pelvis 09/06/2016 showed no evidence of metastatic disease.       09/22/2016 Imaging    Pelvic MRI 09/22/2016 showed no definite residual anal tumor. There was no evidence of tumor involvement of the external sphincter or adjacent organs. There was no pelvic lymphadenopathy.      10/10/2016 -  Radiation Therapy    Patient begun adjuvant radiation with Dr. Lisbeth Renshaw      10/10/2016 -  Chemotherapy    xeloda 500mg , twice daily.      HPI (Hisory of Present Illness): Juan David is a 61 y.o. male here because of a recently diagnosed rectal cancer. She initially saw Ned Card. Per her notes, patient had a 6 month history of an anal mass that progressed in size over time. He went to the ED of 07/26/2016 with complaints of rectal bleeding. He underwent a hemorrhoidectomy on 08/26/2016 and was noted to have a mobile right lateral anal canal mass that invaded the anal sphincter muscle. The pathology showed an invasive  adenocarcinoma, well-differentiated, spanning 1.3cm. The resection margins were negative. Pathologist notes that tumor would be best staged at pT2 given muscle involvement. On 09/06/2016, CT CAP showed no evidence of metastatic disease. Pelvic MRI on 11/23/2016 showed no definite anal tumor.  He has seen Dr. Lisbeth Renshaw who reccomends adjuvant chemoradiation with plans to begin 10/10/2016.  CURRENT THERAPY: Concurrent radiation and chemotherapy with Xeloda 1500 mg twice daily, started on 10/10/2016.  INTERVAL HISTORY: He presents to the clinic today to begin chemoradiation therapy. He begins radiation this afternoon and begun taking Xeloda this morning. He had a copay of around $100 and paid of out pocket. He has not experienced any nausea or vomiting since taking Xeloda this morning. He has a colonoscopy scheduled for 8/9.    REVIEW OF SYSTEMS:   Constitutional: Denies fevers, chills or abnormal weight loss Eyes: Denies blurriness of vision Ears, nose, mouth, throat, and face: Denies mucositis or sore throat Respiratory: Denies cough, dyspnea or wheezes Cardiovascular: Denies palpitation, chest discomfort or lower extremity swelling Gastrointestinal:  Denies nausea, heartburn or change in bowel habits Skin: Denies abnormal skin rashes Lymphatics: Denies new lymphadenopathy or easy bruising Neurological:Denies numbness, tingling or new weaknesses Behavioral/Psych: Mood is stable, no new changes  All other systems were reviewed with the patient and are negative.  MEDICAL HISTORY:  Past Medical History:  Diagnosis Date  . Arthritis   . Chronic anemia   . History of cardiovascular stress test    per pt in 1980's , told was normal  .  History of DVT of lower extremity    post right knee surgery 1998  lower extremitiy  treated w/ coumadin for a year/  per pt no dvt since  . 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 Hypertension   . Iron  deficiency   . Legally blind in left eye, as defined in [place]    per pt only see light  . PFO (patent foramen ovale)    per TEE done 05-11-2009   . 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 Wears glasses     SURGICAL HISTORY: Past Surgical History:  Procedure Laterality Date  . ARTHROSCOPIC REPAIR ACL Right 1998  . EXPLORATION AND REPAIR LEFT EYE INJURY  08-18-2001   [place]   ruptured globe- repair corneal laceration, reposition of prolapsed uveal tissue  . HEMORRHOID SURGERY N/A 09/01/2016   Procedure: HEMORRHOIDECTOMY;  Surgeon: Leighton Ruff, MD;  Location: [facility];  Service: General;  Laterality: N/A;  . SUPERFICIAL KERATECTOMY Left 06-29-2011    [facility]    with EDTA scrub of left eye  . TEE WITHOUT CARDIOVERSION  05-11-2009  dr Dorris Carnes   LVSEF 55-65%/  mild thickened AV without AI/  trace MR/ mixed fixed artherosclerosis plaqueing thoracic aorta/  no evidence thrombus/  by agitated saling and color doppler there was a PFO    I have reviewed the social history and family history with the patient and they are unchanged from previous note.  ALLERGIES:  has No Known Allergies.  MEDICATIONS:  Current Outpatient Prescriptions  Medication Sig Dispense Refill  . aspirin 325 MG tablet Take 325 mg by mouth daily.    . capecitabine (XELODA) 500 MG tablet Take 3 tablets (1,500 mg total) by mouth 2 (two) times daily after a meal. Take on days of radiation only 168 tablet 0  . docusate sodium (COLACE) 100 MG capsule Take 100 mg by mouth 2 (two) times daily as needed for mild constipation.    . Ferrous Sulfate (IRON) 325 (65 Fe) MG TABS Take 5 tablets by mouth every morning.    . hydrochlorothiazide (HYDRODIURIL) 50 MG tablet Take 25 mg by mouth every morning.   0  . hypromellose (GENTEAL) 0.3 % GEL ophthalmic ointment     . Multiple Vitamin (MULTI-VITAMINS) TABS Take 1 tablet by mouth daily.  Adult chewable    . oxyCODONE (OXY IR/ROXICODONE) 5 MG immediate release tablet Take 1-2 tablets (5-10 mg total) by mouth every 6 (six) hours as needed for severe pain. (Patient not taking: Reported on 09/28/2016) 20 tablet 0  . Potassium 95 MG TABS Take 1 tablet by mouth every morning.    . prednisoLONE acetate (PRED FORTE) 1 % ophthalmic suspension Place 1 drop into the left eye as needed.    . prochlorperazine (COMPAZINE) 10 MG tablet Take 1 tablet (10 mg total) by mouth every 6 (six) hours as needed for nausea or vomiting. 30 tablet 0  . timolol (TIMOPTIC) 0.5 % ophthalmic solution Place 1 drop into the left eye every morning.   11   No current facility-administered medications for this visit.     PHYSICAL EXAMINATION:  ECOG PERFORMANCE STATUS: 0 - Asymptomatic  Vitals:   10/10/16 1023  BP: (!) 139/93  Pulse: 76  Resp: 18  Temp: 97.8 F (36.6 C)   Filed Weights   10/10/16 1023  Weight: 135 lb 1.6 oz (61.3 kg)    GENERAL:alert, no distress and comfortable SKIN: skin color, texture, turgor are normal, no rashes or significant lesions EYES: normal, Conjunctiva are pink and non-injected, sclera clear OROPHARYNX:no exudate, no erythema and lips, buccal mucosa, and tongue normal  NECK: supple, thyroid normal size, non-tender, without nodularity LYMPH:  no palpable lymphadenopathy in the cervical, axillary or inguinal LUNGS: clear to auscultation and percussion with normal breathing effort HEART: regular rate & rhythm and no murmurs and no lower extremity edema ABDOMEN:abdomen soft, non-tender and normal bowel sounds Musculoskeletal:no cyanosis of digits and no clubbing  NEURO: alert & oriented x 3 with fluent speech, no focal motor/sensory deficits  LABORATORY DATA:  I have reviewed the data as listed CBC Latest Ref Rng & Units 10/06/2016 09/01/2016  WBC 4.0 - 10.3 10e3/uL 9.3 -  Hemoglobin 13.0 - 17.1 g/dL 13.3 14.3  Hematocrit 38.4 - 49.9 % 40.1 42.0  Platelets 140 - 400  10e3/uL 247 -     CMP Latest Ref Rng & Units 10/06/2016 09/01/2016  Glucose 70 - 140 mg/dl 97 88  BUN 7.0 - 26.0 mg/dL 13.4 19  Creatinine 0.7 - 1.3 mg/dL 1.0 0.80  Sodium 136 - 145 mEq/L 139 139  Potassium 3.5 - 5.1 mEq/L 4.0 4.5  Chloride 101 - 111 mmol/L - 106  CO2 22 - 29 mEq/L 26 -  Calcium 8.4 - 10.4 mg/dL 9.9 -  Total Protein 6.4 - 8.3 g/dL 7.7 -  Total Bilirubin 0.20 - 1.20 mg/dL 0.32 -  Alkaline Phos 40 - 150 U/L 96 -  AST 5 - 34 U/L 22 -  ALT 0 - 55 U/L 21 -   PATHOLOGY REPORT: Diagnosis 09/01/2016 Anus, biopsy, canal mass - INVASIVE ADENOCARCINOMA, WELL DIFFERENTIATED, SPANNING 1.3 CM. - TUMOR INVADES INTO ANAL SPHINCTER MUSCLE. - RESECTION MARGINS ARE NEGATIVE. Microscopic Comment Dr. Melina Copa has reviewed the case. Dr. Marcello Moores was paged on 09/02/2016. ADDITIONAL INFORMATION: The tumor would best be staged as a pT2 given involvement of muscle.   RADIOGRAPHIC STUDIES: MR Pelvis w wo Contrast 10/02/2016 IMPRESSION: No definite residual anal tumor identified. No evidence of tumor involvement of the external sphincter or adjacent organs. No pelvic Lymphadenopathy.  CT Chest, Abdomen, Pelvic w Contrast 09/06/2016 IMPRESSION: 1. No evidence to suggest metastatic disease in the chest, abdomen or pelvis. 2. Aortic atherosclerosis, in addition to 2 vessel coronary artery disease. Please note that although the presence of coronary artery calcium documents the presence of coronary artery disease, the severity of this disease and any potential stenosis cannot be assessed on this non-gated CT examination. Assessment for potential risk factor modification, dietary therapy or pharmacologic therapy may be warranted, if clinically indicated. 3. Additional incidental findings, as above.  I have personally reviewed the radiological images as listed and agreed with the findings in the report. No results found.   ASSESSMENT & PLAN: 61 year old African-American male, with past  medical history of hypertension, iron deficiency, glaucoma, remote history of DVT, presented with rectal mass and rectal bleeding.  1.Adenocarcinoma of the distal rectum, pT2cN0M0, stage I -He is status post right lateral anal/low rectum mass resection by Dr. Marcello Moores. His surgical past reviewed a T2 tumor, invasive adenocarcinoma. Pelvic MRI showed no residual tumor and no adenopathy. This was reviewed with patient previously. -Standard surgery APR was discussed with patient, he declined. -Case was discussed in the tumor board, we recommend him to undergo concurrent chemoradiation, to reduce his risk of local recurrence. I recommend concurrent Xeloda  8 25 mg/m twice daily with radiation. Potential side effects discussed with patient, he agrees to proceed. - patient begun concurrent adjuvant chemoradiation therapy 8/6. He is knowledgeable of the side effects of both chemo and radiation. - he should stop taking baby Aspirin and Iron pills until his colonoscopy in 3 days  - I have advised the patient to take nasuea pill as needed for chemotherapy related nausea. - I will prescribe Compazine -We'll obtain lab weekly, and follow-up every other week during his chemoradiation. -We discussed rectal cancer surveillance after he completes chemoradiation. -He is scheduled to have a full colonoscopy in 3 days.    PLAN -Today's lab results reviewed with patient, unremarkable. He has started chemoradiation today. - labs weekly - colonoscopy Thursday - f/u in 2 weeks  No problem-specific Assessment & Plan notes found for this encounter.   No orders of the defined types were placed in this encounter.  All questions were answered. The patient knows to call the clinic with any problems, questions or concerns. No barriers to learning was detected. I spent 20 minutes counseling the patient face to face. The total time spent in the appointment was 25 minutes and more than 50% was on counseling and review of  test results    This document serves as a record of services personally performed by Truitt Merle, MD. It was created on her behalf by Brandt Loosen, a trained medical scribe. The creation of this record is based on the scribe's personal observations and the provider's statements to them. This document has been checked and approved by the attending provider.  Truitt Merle  10/10/2016

## 2016-10-07 NOTE — Telephone Encounter (Addendum)
Received call this am from pt's wife, Juan David who is in [place] taking care of family & states that pt is scheduled fo colonoscopy 10/13/16 & last colonoscopy was done at [facility] in 06 or 07 & Dr Burr Medico wanted pt to have colonoscopy before radiation & chemo.  She is just checking to make sure of this b/c pt is scheduled for Monday 10/10/16 for radiation. Call back # is [phone] Discussed with Dr Burr Medico & she talked with Dr Penelope Coop & 10/13/16 was the soonest he could do colonoscopy.  She is OK with doing colonoscopy even though chemo & radiation is to start Monday.  He should be OK to do colonoscopy at that time.  Discussed with pt's wife & she is OK with this.  She also has FMLA papers that need to be signed & Dr Arelia Sneddon suggested that Dr Burr Medico do these.  She was given our fax # to send papers.  She reports that she is very concerned about her husband & would like [place] to call if there are any concerns.

## 2016-10-10 ENCOUNTER — Other Ambulatory Visit: Payer: Self-pay

## 2016-10-10 ENCOUNTER — Other Ambulatory Visit: Payer: 59

## 2016-10-10 ENCOUNTER — Ambulatory Visit
Admission: RE | Admit: 2016-10-10 | Discharge: 2016-10-10 | Disposition: A | Discharge status: Home / Self Care | Payer: 59 | Source: Ambulatory Visit | Attending: Radiation Oncology | Admitting: Radiation Oncology

## 2016-10-10 ENCOUNTER — Encounter: Payer: Self-pay | Admitting: Hematology

## 2016-10-10 ENCOUNTER — Ambulatory Visit (HOSPITAL_BASED_OUTPATIENT_CLINIC_OR_DEPARTMENT_OTHER): Payer: 59 | Admitting: Hematology

## 2016-10-10 VITALS — BP 139/93 | HR 76 | Temp 97.8°F | Resp 18 | Ht 70.5 in | Wt 135.1 lb

## 2016-10-10 DIAGNOSIS — Z79899 Other long term (current) drug therapy: Secondary | ICD-10-CM | POA: Diagnosis not present

## 2016-10-10 DIAGNOSIS — E611 Iron deficiency: Secondary | ICD-10-CM | POA: Diagnosis not present

## 2016-10-10 DIAGNOSIS — F17210 Nicotine dependence, cigarettes, uncomplicated: Secondary | ICD-10-CM | POA: Diagnosis not present

## 2016-10-10 DIAGNOSIS — H5462 Unqualified visual loss, left eye, normal vision right eye: Secondary | ICD-10-CM | POA: Diagnosis not present

## 2016-10-10 DIAGNOSIS — Z7982 Long term (current) use of aspirin: Secondary | ICD-10-CM | POA: Diagnosis not present

## 2016-10-10 DIAGNOSIS — I10 Essential (primary) hypertension: Secondary | ICD-10-CM | POA: Diagnosis not present

## 2016-10-10 DIAGNOSIS — Z86718 Personal history of other venous thrombosis and embolism: Secondary | ICD-10-CM | POA: Diagnosis not present

## 2016-10-10 DIAGNOSIS — C20 Malignant neoplasm of rectum: Secondary | ICD-10-CM

## 2016-10-10 DIAGNOSIS — Z510 Encounter for antineoplastic radiation therapy: Secondary | ICD-10-CM | POA: Diagnosis not present

## 2016-10-10 MED ORDER — PROCHLORPERAZINE MALEATE 10 MG PO TABS: 10.0000 mg | ORAL_TABLET | Freq: Four times a day (QID) | ORAL | 0 refills | Status: DC | PRN

## 2016-10-10 MED FILL — PROCHLORPERAZINE 10 MG TAB: 10 | 7 days supply | Qty: 30 | Fill #0

## 2016-10-11 ENCOUNTER — Ambulatory Visit
Admission: RE | Admit: 2016-10-11 | Discharge: 2016-10-11 | Disposition: A | Discharge status: Home / Self Care | Payer: 59 | Source: Ambulatory Visit | Attending: Radiation Oncology | Admitting: Radiation Oncology

## 2016-10-11 DIAGNOSIS — Z7982 Long term (current) use of aspirin: Secondary | ICD-10-CM | POA: Diagnosis not present

## 2016-10-11 DIAGNOSIS — I10 Essential (primary) hypertension: Secondary | ICD-10-CM | POA: Diagnosis not present

## 2016-10-11 DIAGNOSIS — E611 Iron deficiency: Secondary | ICD-10-CM | POA: Diagnosis not present

## 2016-10-11 DIAGNOSIS — Z510 Encounter for antineoplastic radiation therapy: Secondary | ICD-10-CM | POA: Diagnosis not present

## 2016-10-11 DIAGNOSIS — H5462 Unqualified visual loss, left eye, normal vision right eye: Secondary | ICD-10-CM | POA: Diagnosis not present

## 2016-10-11 DIAGNOSIS — F17210 Nicotine dependence, cigarettes, uncomplicated: Secondary | ICD-10-CM | POA: Diagnosis not present

## 2016-10-11 DIAGNOSIS — C20 Malignant neoplasm of rectum: Secondary | ICD-10-CM | POA: Diagnosis not present

## 2016-10-11 DIAGNOSIS — Z86718 Personal history of other venous thrombosis and embolism: Secondary | ICD-10-CM | POA: Diagnosis not present

## 2016-10-11 DIAGNOSIS — Z79899 Other long term (current) drug therapy: Secondary | ICD-10-CM | POA: Diagnosis not present

## 2016-10-11 MED FILL — GAVILYTE-N SOLUTION: 420 | 2 days supply | Qty: 4000 | Fill #0

## 2016-10-12 ENCOUNTER — Ambulatory Visit
Admission: RE | Admit: 2016-10-12 | Discharge: 2016-10-12 | Disposition: A | Discharge status: Home / Self Care | Payer: 59 | Source: Ambulatory Visit | Attending: Radiation Oncology | Admitting: Radiation Oncology

## 2016-10-12 DIAGNOSIS — Z7982 Long term (current) use of aspirin: Secondary | ICD-10-CM | POA: Diagnosis not present

## 2016-10-12 DIAGNOSIS — Z79899 Other long term (current) drug therapy: Secondary | ICD-10-CM | POA: Diagnosis not present

## 2016-10-12 DIAGNOSIS — E611 Iron deficiency: Secondary | ICD-10-CM | POA: Diagnosis not present

## 2016-10-12 DIAGNOSIS — Z86718 Personal history of other venous thrombosis and embolism: Secondary | ICD-10-CM | POA: Diagnosis not present

## 2016-10-12 DIAGNOSIS — H5462 Unqualified visual loss, left eye, normal vision right eye: Secondary | ICD-10-CM | POA: Diagnosis not present

## 2016-10-12 DIAGNOSIS — F17210 Nicotine dependence, cigarettes, uncomplicated: Secondary | ICD-10-CM | POA: Diagnosis not present

## 2016-10-12 DIAGNOSIS — I10 Essential (primary) hypertension: Secondary | ICD-10-CM | POA: Diagnosis not present

## 2016-10-12 DIAGNOSIS — C20 Malignant neoplasm of rectum: Secondary | ICD-10-CM | POA: Diagnosis not present

## 2016-10-12 DIAGNOSIS — Z510 Encounter for antineoplastic radiation therapy: Secondary | ICD-10-CM | POA: Diagnosis not present

## 2016-10-13 ENCOUNTER — Ambulatory Visit
Admission: RE | Admit: 2016-10-13 | Discharge: 2016-10-13 | Disposition: A | Discharge status: Home / Self Care | Payer: 59 | Source: Ambulatory Visit | Attending: Radiation Oncology | Admitting: Radiation Oncology

## 2016-10-13 DIAGNOSIS — C20 Malignant neoplasm of rectum: Secondary | ICD-10-CM | POA: Diagnosis not present

## 2016-10-13 DIAGNOSIS — Z7982 Long term (current) use of aspirin: Secondary | ICD-10-CM | POA: Diagnosis not present

## 2016-10-13 DIAGNOSIS — Z79899 Other long term (current) drug therapy: Secondary | ICD-10-CM | POA: Diagnosis not present

## 2016-10-13 DIAGNOSIS — K5730 Diverticulosis of large intestine without perforation or abscess without bleeding: Secondary | ICD-10-CM | POA: Diagnosis not present

## 2016-10-13 DIAGNOSIS — Z86718 Personal history of other venous thrombosis and embolism: Secondary | ICD-10-CM | POA: Diagnosis not present

## 2016-10-13 DIAGNOSIS — E611 Iron deficiency: Secondary | ICD-10-CM | POA: Diagnosis not present

## 2016-10-13 DIAGNOSIS — Z510 Encounter for antineoplastic radiation therapy: Secondary | ICD-10-CM | POA: Diagnosis not present

## 2016-10-13 DIAGNOSIS — I10 Essential (primary) hypertension: Secondary | ICD-10-CM | POA: Diagnosis not present

## 2016-10-13 DIAGNOSIS — K635 Polyp of colon: Secondary | ICD-10-CM | POA: Diagnosis not present

## 2016-10-13 DIAGNOSIS — H5462 Unqualified visual loss, left eye, normal vision right eye: Secondary | ICD-10-CM | POA: Diagnosis not present

## 2016-10-13 DIAGNOSIS — F17210 Nicotine dependence, cigarettes, uncomplicated: Secondary | ICD-10-CM | POA: Diagnosis not present

## 2016-10-13 DIAGNOSIS — Z85038 Personal history of other malignant neoplasm of large intestine: Secondary | ICD-10-CM | POA: Diagnosis not present

## 2016-10-13 NOTE — Progress Notes (Signed)
Pt here for patient teaching.  Pt given Radiation and You booklet and skin care instructions.  Reviewed areas of pertinence such as diarrhea, fatigue, hair loss, nausea and vomiting, sexual and fertility changes, skin changes and urinary and bladder changes . Pt able to give teach back of to pat skin, use unscented/gentle soap, use baby wipes, have Imodium on hand, drink plenty of water and sitz bath. Pt verbalizes understanding of information given and will contact nursing with any questions or concerns.     [email or web address]

## 2016-10-14 ENCOUNTER — Ambulatory Visit: Payer: 59

## 2016-10-17 ENCOUNTER — Other Ambulatory Visit (HOSPITAL_BASED_OUTPATIENT_CLINIC_OR_DEPARTMENT_OTHER): Payer: 59

## 2016-10-17 ENCOUNTER — Ambulatory Visit
Admission: RE | Admit: 2016-10-17 | Discharge: 2016-10-17 | Disposition: A | Discharge status: Home / Self Care | Payer: 59 | Source: Ambulatory Visit | Attending: Radiation Oncology | Admitting: Radiation Oncology

## 2016-10-17 DIAGNOSIS — Z86718 Personal history of other venous thrombosis and embolism: Secondary | ICD-10-CM | POA: Diagnosis not present

## 2016-10-17 DIAGNOSIS — C20 Malignant neoplasm of rectum: Secondary | ICD-10-CM

## 2016-10-17 DIAGNOSIS — F17210 Nicotine dependence, cigarettes, uncomplicated: Secondary | ICD-10-CM | POA: Diagnosis not present

## 2016-10-17 DIAGNOSIS — Z7982 Long term (current) use of aspirin: Secondary | ICD-10-CM | POA: Diagnosis not present

## 2016-10-17 DIAGNOSIS — I10 Essential (primary) hypertension: Secondary | ICD-10-CM | POA: Diagnosis not present

## 2016-10-17 DIAGNOSIS — Z510 Encounter for antineoplastic radiation therapy: Secondary | ICD-10-CM | POA: Diagnosis not present

## 2016-10-17 DIAGNOSIS — Z79899 Other long term (current) drug therapy: Secondary | ICD-10-CM | POA: Diagnosis not present

## 2016-10-17 DIAGNOSIS — H5462 Unqualified visual loss, left eye, normal vision right eye: Secondary | ICD-10-CM | POA: Diagnosis not present

## 2016-10-17 DIAGNOSIS — E611 Iron deficiency: Secondary | ICD-10-CM | POA: Diagnosis not present

## 2016-10-17 LAB — COMPREHENSIVE METABOLIC PANEL
ALBUMIN: 3.7 g/dL (ref 3.5–5.0)
ALK PHOS: 78 U/L (ref 40–150)
ALT: 17 U/L (ref 0–55)
AST: 21 U/L (ref 5–34)
Anion Gap: 6 mEq/L (ref 3–11)
BUN: 13.5 mg/dL (ref 7.0–26.0)
CALCIUM: 9.8 mg/dL (ref 8.4–10.4)
CHLORIDE: 106 meq/L (ref 98–109)
CO2: 28 mEq/L (ref 22–29)
CREATININE: 1 mg/dL (ref 0.7–1.3)
EGFR: >90 mL/min/{1.73_m2} (ref >90)
GLUCOSE: 95 mg/dL (ref 70–140)
POTASSIUM: 4.1 meq/L (ref 3.5–5.1)
SODIUM: 140 meq/L (ref 136–145)
Total Bilirubin: 0.48 mg/dL (ref 0.20–1.20)
Total Protein: 6.9 g/dL (ref 6.4–8.3)

## 2016-10-17 LAB — CBC WITH DIFFERENTIAL/PLATELET
BASO%: 0.2 % (ref 0.0–2.0)
Basophils Absolute: 0 10*3/uL (ref 0.0–0.1)
EOS%: 2.1 % (ref 0.0–7.0)
Eosinophils Absolute: 0.1 10*3/uL (ref 0.0–0.5)
HEMATOCRIT: 36.6 % — AB (ref 38.4–49.9)
HEMOGLOBIN: 12.3 g/dL — AB (ref 13.0–17.1)
LYMPH#: 1.5 10*3/uL (ref 0.9–3.3)
LYMPH%: 29 % (ref 14.0–49.0)
MCH: 29.5 pg (ref 27.2–33.4)
MCHC: 33.6 g/dL (ref 32.0–36.0)
MCV: 87.8 fL (ref 79.3–98.0)
MONO#: 0.3 10*3/uL (ref 0.1–0.9)
MONO%: 5.8 % (ref 0.0–14.0)
NEUT#: 3.3 10*3/uL (ref 1.5–6.5)
NEUT%: 62.9 % (ref 39.0–75.0)
Platelets: 221 10*3/uL (ref 140–400)
RBC: 4.17 10*6/uL — ABNORMAL LOW (ref 4.20–5.82)
RDW: 14.1 % (ref 11.0–14.6)
WBC: 5.3 10*3/uL (ref 4.0–10.3)

## 2016-10-17 LAB — CEA (IN HOUSE-CHCC): CEA (CHCC-IN HOUSE): 2.74 ng/mL (ref 0.00–5.00)

## 2016-10-18 ENCOUNTER — Ambulatory Visit
Admission: RE | Admit: 2016-10-18 | Discharge: 2016-10-18 | Disposition: A | Discharge status: Home / Self Care | Payer: 59 | Source: Ambulatory Visit | Attending: Radiation Oncology | Admitting: Radiation Oncology

## 2016-10-18 DIAGNOSIS — Z86718 Personal history of other venous thrombosis and embolism: Secondary | ICD-10-CM | POA: Diagnosis not present

## 2016-10-18 DIAGNOSIS — F17210 Nicotine dependence, cigarettes, uncomplicated: Secondary | ICD-10-CM | POA: Diagnosis not present

## 2016-10-18 DIAGNOSIS — I10 Essential (primary) hypertension: Secondary | ICD-10-CM | POA: Diagnosis not present

## 2016-10-18 DIAGNOSIS — H5462 Unqualified visual loss, left eye, normal vision right eye: Secondary | ICD-10-CM | POA: Diagnosis not present

## 2016-10-18 DIAGNOSIS — E611 Iron deficiency: Secondary | ICD-10-CM | POA: Diagnosis not present

## 2016-10-18 DIAGNOSIS — Z7982 Long term (current) use of aspirin: Secondary | ICD-10-CM | POA: Diagnosis not present

## 2016-10-18 DIAGNOSIS — Z79899 Other long term (current) drug therapy: Secondary | ICD-10-CM | POA: Diagnosis not present

## 2016-10-18 DIAGNOSIS — C20 Malignant neoplasm of rectum: Secondary | ICD-10-CM | POA: Diagnosis not present

## 2016-10-18 DIAGNOSIS — Z510 Encounter for antineoplastic radiation therapy: Secondary | ICD-10-CM | POA: Diagnosis not present

## 2016-10-19 ENCOUNTER — Ambulatory Visit
Admission: RE | Admit: 2016-10-19 | Discharge: 2016-10-19 | Disposition: A | Discharge status: Home / Self Care | Payer: 59 | Source: Ambulatory Visit | Attending: Radiation Oncology | Admitting: Radiation Oncology

## 2016-10-19 DIAGNOSIS — I10 Essential (primary) hypertension: Secondary | ICD-10-CM | POA: Diagnosis not present

## 2016-10-19 DIAGNOSIS — C20 Malignant neoplasm of rectum: Secondary | ICD-10-CM | POA: Diagnosis not present

## 2016-10-19 DIAGNOSIS — E611 Iron deficiency: Secondary | ICD-10-CM | POA: Diagnosis not present

## 2016-10-19 DIAGNOSIS — Z86718 Personal history of other venous thrombosis and embolism: Secondary | ICD-10-CM | POA: Diagnosis not present

## 2016-10-19 DIAGNOSIS — F17210 Nicotine dependence, cigarettes, uncomplicated: Secondary | ICD-10-CM | POA: Diagnosis not present

## 2016-10-19 DIAGNOSIS — H5462 Unqualified visual loss, left eye, normal vision right eye: Secondary | ICD-10-CM | POA: Diagnosis not present

## 2016-10-19 DIAGNOSIS — Z79899 Other long term (current) drug therapy: Secondary | ICD-10-CM | POA: Diagnosis not present

## 2016-10-19 DIAGNOSIS — Z510 Encounter for antineoplastic radiation therapy: Secondary | ICD-10-CM | POA: Diagnosis not present

## 2016-10-19 DIAGNOSIS — Z7982 Long term (current) use of aspirin: Secondary | ICD-10-CM | POA: Diagnosis not present

## 2016-10-20 ENCOUNTER — Ambulatory Visit
Admission: RE | Admit: 2016-10-20 | Discharge: 2016-10-20 | Disposition: A | Discharge status: Home / Self Care | Payer: 59 | Source: Ambulatory Visit | Attending: Radiation Oncology | Admitting: Radiation Oncology

## 2016-10-20 DIAGNOSIS — E611 Iron deficiency: Secondary | ICD-10-CM | POA: Diagnosis not present

## 2016-10-20 DIAGNOSIS — F17210 Nicotine dependence, cigarettes, uncomplicated: Secondary | ICD-10-CM | POA: Diagnosis not present

## 2016-10-20 DIAGNOSIS — Z86718 Personal history of other venous thrombosis and embolism: Secondary | ICD-10-CM | POA: Diagnosis not present

## 2016-10-20 DIAGNOSIS — C20 Malignant neoplasm of rectum: Secondary | ICD-10-CM | POA: Diagnosis not present

## 2016-10-20 DIAGNOSIS — H5462 Unqualified visual loss, left eye, normal vision right eye: Secondary | ICD-10-CM | POA: Diagnosis not present

## 2016-10-20 DIAGNOSIS — Z7982 Long term (current) use of aspirin: Secondary | ICD-10-CM | POA: Diagnosis not present

## 2016-10-20 DIAGNOSIS — Z510 Encounter for antineoplastic radiation therapy: Secondary | ICD-10-CM | POA: Diagnosis not present

## 2016-10-20 DIAGNOSIS — I10 Essential (primary) hypertension: Secondary | ICD-10-CM | POA: Diagnosis not present

## 2016-10-20 DIAGNOSIS — Z79899 Other long term (current) drug therapy: Secondary | ICD-10-CM | POA: Diagnosis not present

## 2016-10-20 NOTE — Progress Notes (Signed)
[facility]  [phone] [phone]  Clinic Follow up Note   Patient Care Team: Leonard Downing, MD as PCP - General (Family Medicine) Leighton Ruff, MD as Consulting Physician (General Surgery) Kyung Rudd, MD as Consulting Physician (Radiation Oncology) Truitt Merle, MD as Consulting Physician (Hematology) 10/24/2016  Chief Complaint: Follow up rectal cancer  SUMMARY OF ONCOLOGIC HISTORY:   Rectal adenocarcinoma ([place])   09/01/2016 Initial Diagnosis    Rectal adenocarcinoma ([place])      09/01/2016 Surgery    Hemorrhoidectomy 09/01/2016. Noted to have a mobile right lateral anal canal mass. Dr. Leighton Ruff.       09/01/2016 Pathology Results    Invasive adenocarcinoma, well-differentiated, spanning 1.3 cm. The tumor invaded into the anal sphincter muscle. Resection margins were negative. The pathologist notes the tumor would be best staged as pT2 given involvement of muscle.       09/06/2016 Imaging    CT scans chest/abdomen/pelvis 09/06/2016 showed no evidence of metastatic disease.       09/22/2016 Imaging    Pelvic MRI 09/22/2016 showed no definite residual anal tumor. There was no evidence of tumor involvement of the external sphincter or adjacent organs. There was no pelvic lymphadenopathy.      10/10/2016 -  Radiation Therapy    Patient begun adjuvant radiation with Dr. Lisbeth Renshaw      10/10/2016 -  Chemotherapy    xeloda 500mg , twice daily.      HPI (Hisory of Present Illness): Juan David is a 61 y.o. male here because of a recently diagnosed rectal cancer. She initially saw Ned Card. Per her notes, patient had a 6 month history of an anal mass that progressed in size over time. He went to the ED of 07/26/2016 with complaints of rectal bleeding. He underwent a hemorrhoidectomy on 08/26/2016 and was noted to have a mobile right lateral anal canal mass that invaded the anal sphincter muscle. The pathology showed an invasive  adenocarcinoma, well-differentiated, spanning 1.3cm. The resection margins were negative. Pathologist notes that tumor would be best staged at pT2 given muscle involvement. On 09/06/2016, CT CAP showed no evidence of metastatic disease. Pelvic MRI on 11/23/2016 showed no definite anal tumor.  He has seen Dr. Lisbeth Renshaw who reccomends adjuvant chemoradiation with plans to begin 10/10/2016.  CURRENT THERAPY: Concurrent radiation and chemotherapy with Xeloda 1500 mg twice daily, started on 10/10/2016.  INTERVAL HISTORY: He presents to the clinic today. He is doing well overall. He experienced diarrhea over the weekend which was managed with imodium. States his appetite is good. He uses the bathroom as soon as he eats which is a bit problematic while working so he often doesn't eat lunch. He began working again last week and works 10 hour days. He drinks an Ensure in the morning and a heavy meal at dinner. He has lost 5 lbs since 8/6.    REVIEW OF SYSTEMS:   Constitutional: Denies fevers, chills (+) Weight loss of 5 lbs since 8/6 Eyes: Denies blurriness of vision Ears, nose, mouth, throat, and face: Denies mucositis or sore throat Respiratory: Denies cough, dyspnea or wheezes Cardiovascular: Denies palpitation, chest discomfort or lower extremity swelling Gastrointestinal:  Denies nausea, heartburn (+) Diarrhea managed with imodium Skin: Denies abnormal skin rashes Lymphatics: Denies new lymphadenopathy or easy bruising Neurological:Denies numbness, tingling or new weaknesses Behavioral/Psych: Mood is stable, no new changes  All other systems were reviewed with the patient and are negative.  MEDICAL HISTORY:  Past Medical History:  Diagnosis Date  . Arthritis   . Chronic anemia   . History of cardiovascular stress test    per pt in 1980's , told was normal  . History of DVT of lower extremity    post right knee surgery 1998  lower extremitiy  treated w/ coumadin for a year/  per pt no dvt since  .  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 Hypertension   . Iron deficiency   . Legally blind in left eye, as defined in [place]    per pt only see light  . PFO (patent foramen ovale)    per TEE done 05-11-2009   . 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 Wears glasses     SURGICAL HISTORY: Past Surgical History:  Procedure Laterality Date  . ARTHROSCOPIC REPAIR ACL Right 1998  . EXPLORATION AND REPAIR LEFT EYE INJURY  08-18-2001   [place]   ruptured globe- repair corneal laceration, reposition of prolapsed uveal tissue  . HEMORRHOID SURGERY N/A 09/01/2016   Procedure: HEMORRHOIDECTOMY;  Surgeon: Leighton Ruff, MD;  Location: [facility];  Service: General;  Laterality: N/A;  . SUPERFICIAL KERATECTOMY Left 06-29-2011    [facility]    with EDTA scrub of left eye  . TEE WITHOUT CARDIOVERSION  05-11-2009  dr Dorris Carnes   LVSEF 55-65%/  mild thickened AV without AI/  trace MR/ mixed fixed artherosclerosis plaqueing thoracic aorta/  no evidence thrombus/  by agitated saling and color doppler there was a PFO    I have reviewed the social history and family history with the patient and they are unchanged from previous note.  ALLERGIES:  has No Known Allergies.  MEDICATIONS:  Current Outpatient Prescriptions  Medication Sig Dispense Refill  . aspirin 325 MG tablet Take 325 mg by mouth daily.    . capecitabine (XELODA) 500 MG tablet Take 3 tablets (1,500 mg total) by mouth 2 (two) times daily after a meal. Take on days of radiation only 168 tablet 0  . docusate sodium (COLACE) 100 MG capsule Take 100 mg by mouth 2 (two) times daily as needed for mild constipation.    . Ferrous Sulfate (IRON) 325 (65 Fe) MG TABS Take 5 tablets by mouth every morning.    . hydrochlorothiazide (HYDRODIURIL) 50 MG tablet Take 25 mg by mouth every morning.   0  .  hypromellose (GENTEAL) 0.3 % GEL ophthalmic ointment     . Multiple Vitamin (MULTI-VITAMINS) TABS Take 1 tablet by mouth daily. Adult chewable    . Potassium 95 MG TABS Take 1 tablet by mouth every morning.    . prochlorperazine (COMPAZINE) 10 MG tablet Take 1 tablet (10 mg total) by mouth every 6 (six) hours as needed for nausea or vomiting. 30 tablet 0  . timolol (TIMOPTIC) 0.5 % ophthalmic solution Place 1 drop into the left eye every morning.   11  . oxyCODONE (OXY IR/ROXICODONE) 5 MG immediate release tablet Take 1-2 tablets (5-10 mg total) by mouth every 6 (six) hours as needed for severe pain. (Patient not taking: Reported on 09/28/2016) 20 tablet 0  . prednisoLONE acetate (PRED FORTE) 1 % ophthalmic suspension Place 1 drop into the left eye as needed.     No current facility-administered medications for this visit.     PHYSICAL EXAMINATION:  ECOG PERFORMANCE STATUS: 0 -  Asymptomatic  Vitals:   10/24/16 1017  BP: 137/85  Pulse: 64  Resp: 20  Temp: 98.1 F (36.7 C)  SpO2: 99%   Filed Weights   10/24/16 1017  Weight: 130 lb 12.8 oz (59.3 kg)    GENERAL:alert, no distress and comfortable SKIN: skin color, texture, turgor are normal, no rashes or significant lesions EYES: normal, Conjunctiva are pink and non-injected, sclera clear OROPHARYNX:no exudate, no erythema and lips, buccal mucosa, and tongue normal  NECK: supple, thyroid normal size, non-tender, without nodularity LYMPH:  no palpable lymphadenopathy in the cervical, axillary or inguinal LUNGS: clear to auscultation and percussion with normal breathing effort HEART: regular rate & rhythm and no murmurs and no lower extremity edema ABDOMEN:abdomen soft, non-tender and normal bowel sounds Musculoskeletal:no cyanosis of digits and no clubbing  NEURO: alert & oriented x 3 with fluent speech, no focal motor/sensory deficits  LABORATORY DATA:  I have reviewed the data as listed CBC Latest Ref Rng & Units 10/24/2016  10/17/2016 10/06/2016  WBC 4.0 - 10.3 10e3/uL 6.6 5.3 9.3  Hemoglobin 13.0 - 17.1 g/dL 12.4(L) 12.3(L) 13.3  Hematocrit 38.4 - 49.9 % 36.7(L) 36.6(L) 40.1  Platelets 140 - 400 10e3/uL 169 221 247     CMP Latest Ref Rng & Units 10/24/2016 10/17/2016 10/06/2016  Glucose 70 - 140 mg/dl 107 95 97  BUN 7.0 - 26.0 mg/dL 13.6 13.5 13.4  Creatinine 0.7 - 1.3 mg/dL 1.0 1.0 1.0  Sodium 136 - 145 mEq/L 137 140 139  Potassium 3.5 - 5.1 mEq/L 4.3 4.1 4.0  Chloride 101 - 111 mmol/L - - -  CO2 22 - 29 mEq/L 28 28 26   Calcium 8.4 - 10.4 mg/dL 9.5 9.8 9.9  Total Protein 6.4 - 8.3 g/dL 6.9 6.9 7.7  Total Bilirubin 0.20 - 1.20 mg/dL 0.44 0.48 0.32  Alkaline Phos 40 - 150 U/L 76 78 96  AST 5 - 34 U/L 20 21 22   ALT 0 - 55 U/L 16 17 21    PATHOLOGY REPORT: Diagnosis 09/01/2016 Anus, biopsy, canal mass - INVASIVE ADENOCARCINOMA, WELL DIFFERENTIATED, SPANNING 1.3 CM. - TUMOR INVADES INTO ANAL SPHINCTER MUSCLE. - RESECTION MARGINS ARE NEGATIVE. Microscopic Comment Dr. Melina Copa has reviewed the case. Dr. Marcello Moores was paged on 09/02/2016. ADDITIONAL INFORMATION: The tumor would best be staged as a pT2 given involvement of muscle.   RADIOGRAPHIC STUDIES: MR Pelvis w wo Contrast 2016-10-04 IMPRESSION: No definite residual anal tumor identified. No evidence of tumor involvement of the external sphincter or adjacent organs. No pelvic Lymphadenopathy.  CT Chest, Abdomen, Pelvic w Contrast 09/06/2016 IMPRESSION: 1. No evidence to suggest metastatic disease in the chest, abdomen or pelvis. 2. Aortic atherosclerosis, in addition to 2 vessel coronary artery disease. Please note that although the presence of coronary artery calcium documents the presence of coronary artery disease, the severity of this disease and any potential stenosis cannot be assessed on this non-gated CT examination. Assessment for potential risk factor modification, dietary therapy or pharmacologic therapy may be warranted, if clinically  indicated. 3. Additional incidental findings, as above.  I have personally reviewed the radiological images as listed and agreed with the findings in the report. No results found.   ASSESSMENT & PLAN: 61 y.o.-year-old African-American male, with past medical history of hypertension, iron deficiency, glaucoma, remote history of DVT, presented with rectal mass and rectal bleeding.  1.Adenocarcinoma of the distal rectum, pT2cN0M0, stage I -He is status post right lateral anal/low rectum mass resection by Dr. Marcello Moores. His surgical path reviewed  a T2 tumor, invasive adenocarcinoma. Pelvic MRI showed no residual tumor and no adenopathy. This was reviewed with patient previously. -Standard surgery APR was discussed with patient, he declined. -Case was discussed in the tumor board, we recommend him to undergo concurrent chemoradiation, to reduce his risk of local recurrence. I recommend concurrent Xeloda 8 25 mg/m twice daily with radiation. Potential side effects discussed with patient, he agrees to proceed. - patient begun concurrent adjuvant chemoradiation therapy 8/6. He is tolerating very well  - I have advised the patient to take nasuea pill as needed for chemotherapy related nausea. -We'll obtain lab weekly, and follow-up every other week during his chemoradiation. -We discussed rectal cancer surveillance after he completes chemoradiation. -Lab reviewed, slightly anemic, weight loss of 5 lbs since 8/6, I encourage him to eat more. Adequate for treatment. He will continue chemoradiation. -He is currently undergoing radiation treatment with Dr. Lisbeth Renshaw and is scheduled to complete treatment on 9/18  2. Weight loss and diarrhea -Weight loss of 5 lbs since 8/6 -Diarrhea managed with Imodium -Drinks one Ensure daily and does not eat lunch when working. Encouraged patient to drink additional Ensure daily.  PLAN -Today's lab results reviewed with patient, unremarkable. He will continue with  chemoradiation. - labs weekly - f/u in 2 weeks  No problem-specific Assessment & Plan notes found for this encounter.   No orders of the defined types were placed in this encounter.  All questions were answered. The patient knows to call the clinic with any problems, questions or concerns. No barriers to learning was detected. I spent 15 minutes counseling the patient face to face. The total time spent in the appointment was 20 minutes and more than 50% was on counseling and review of test results   This document serves as a record of services personally performed by Truitt Merle, MD. It was created on her behalf by Arlyce Harman, a trained medical scribe. The creation of this record is based on the scribe's personal observations and the provider's statements to them. This document has been checked and approved by the attending provider.   Truitt Merle  10/24/2016

## 2016-10-21 ENCOUNTER — Ambulatory Visit
Admission: RE | Admit: 2016-10-21 | Discharge: 2016-10-21 | Disposition: A | Discharge status: Home / Self Care | Payer: 59 | Source: Ambulatory Visit | Attending: Radiation Oncology | Admitting: Radiation Oncology

## 2016-10-21 DIAGNOSIS — H5462 Unqualified visual loss, left eye, normal vision right eye: Secondary | ICD-10-CM | POA: Diagnosis not present

## 2016-10-21 DIAGNOSIS — Z7982 Long term (current) use of aspirin: Secondary | ICD-10-CM | POA: Diagnosis not present

## 2016-10-21 DIAGNOSIS — Z510 Encounter for antineoplastic radiation therapy: Secondary | ICD-10-CM | POA: Diagnosis not present

## 2016-10-21 DIAGNOSIS — F17210 Nicotine dependence, cigarettes, uncomplicated: Secondary | ICD-10-CM | POA: Diagnosis not present

## 2016-10-21 DIAGNOSIS — Z86718 Personal history of other venous thrombosis and embolism: Secondary | ICD-10-CM | POA: Diagnosis not present

## 2016-10-21 DIAGNOSIS — Z79899 Other long term (current) drug therapy: Secondary | ICD-10-CM | POA: Diagnosis not present

## 2016-10-21 DIAGNOSIS — E611 Iron deficiency: Secondary | ICD-10-CM | POA: Diagnosis not present

## 2016-10-21 DIAGNOSIS — I10 Essential (primary) hypertension: Secondary | ICD-10-CM | POA: Diagnosis not present

## 2016-10-21 DIAGNOSIS — C20 Malignant neoplasm of rectum: Secondary | ICD-10-CM | POA: Diagnosis not present

## 2016-10-24 ENCOUNTER — Ambulatory Visit
Admission: RE | Admit: 2016-10-24 | Discharge: 2016-10-24 | Disposition: A | Discharge status: Home / Self Care | Payer: 59 | Source: Ambulatory Visit | Attending: Radiation Oncology | Admitting: Radiation Oncology

## 2016-10-24 ENCOUNTER — Other Ambulatory Visit (HOSPITAL_BASED_OUTPATIENT_CLINIC_OR_DEPARTMENT_OTHER): Payer: 59

## 2016-10-24 ENCOUNTER — Telehealth: Payer: Self-pay | Admitting: Hematology

## 2016-10-24 ENCOUNTER — Ambulatory Visit (HOSPITAL_BASED_OUTPATIENT_CLINIC_OR_DEPARTMENT_OTHER): Payer: 59 | Admitting: Hematology

## 2016-10-24 VITALS — BP 137/85 | HR 64 | Temp 98.1°F | Resp 20 | Ht 70.5 in | Wt 130.8 lb

## 2016-10-24 DIAGNOSIS — C20 Malignant neoplasm of rectum: Secondary | ICD-10-CM

## 2016-10-24 DIAGNOSIS — Z7982 Long term (current) use of aspirin: Secondary | ICD-10-CM | POA: Diagnosis not present

## 2016-10-24 DIAGNOSIS — I10 Essential (primary) hypertension: Secondary | ICD-10-CM | POA: Diagnosis not present

## 2016-10-24 DIAGNOSIS — H5462 Unqualified visual loss, left eye, normal vision right eye: Secondary | ICD-10-CM | POA: Diagnosis not present

## 2016-10-24 DIAGNOSIS — R634 Abnormal weight loss: Secondary | ICD-10-CM

## 2016-10-24 DIAGNOSIS — Z510 Encounter for antineoplastic radiation therapy: Secondary | ICD-10-CM | POA: Diagnosis not present

## 2016-10-24 DIAGNOSIS — Z79899 Other long term (current) drug therapy: Secondary | ICD-10-CM | POA: Diagnosis not present

## 2016-10-24 DIAGNOSIS — Z86718 Personal history of other venous thrombosis and embolism: Secondary | ICD-10-CM | POA: Diagnosis not present

## 2016-10-24 DIAGNOSIS — E611 Iron deficiency: Secondary | ICD-10-CM | POA: Diagnosis not present

## 2016-10-24 DIAGNOSIS — R197 Diarrhea, unspecified: Secondary | ICD-10-CM

## 2016-10-24 DIAGNOSIS — F17210 Nicotine dependence, cigarettes, uncomplicated: Secondary | ICD-10-CM | POA: Diagnosis not present

## 2016-10-24 LAB — CBC WITH DIFFERENTIAL/PLATELET
BASO%: 0.2 % (ref 0.0–2.0)
BASOS ABS: 0 10*3/uL (ref 0.0–0.1)
EOS%: 1.7 % (ref 0.0–7.0)
Eosinophils Absolute: 0.1 10*3/uL (ref 0.0–0.5)
HEMATOCRIT: 36.7 % — AB (ref 38.4–49.9)
HGB: 12.4 g/dL — ABNORMAL LOW (ref 13.0–17.1)
LYMPH#: 0.9 10*3/uL (ref 0.9–3.3)
LYMPH%: 13.8 % — ABNORMAL LOW (ref 14.0–49.0)
MCH: 29.5 pg (ref 27.2–33.4)
MCHC: 33.8 g/dL (ref 32.0–36.0)
MCV: 87.2 fL (ref 79.3–98.0)
MONO#: 0.4 10*3/uL (ref 0.1–0.9)
MONO%: 5.8 % (ref 0.0–14.0)
NEUT#: 5.2 10*3/uL (ref 1.5–6.5)
NEUT%: 78.5 % — AB (ref 39.0–75.0)
Platelets: 169 10*3/uL (ref 140–400)
RBC: 4.21 10*6/uL (ref 4.20–5.82)
RDW: 13.8 % (ref 11.0–14.6)
WBC: 6.6 10*3/uL (ref 4.0–10.3)

## 2016-10-24 LAB — COMPREHENSIVE METABOLIC PANEL
ALT: 16 U/L (ref 0–55)
ANION GAP: 5 meq/L (ref 3–11)
AST: 20 U/L (ref 5–34)
Albumin: 3.8 g/dL (ref 3.5–5.0)
Alkaline Phosphatase: 76 U/L (ref 40–150)
BUN: 13.6 mg/dL (ref 7.0–26.0)
CALCIUM: 9.5 mg/dL (ref 8.4–10.4)
CHLORIDE: 104 meq/L (ref 98–109)
CO2: 28 meq/L (ref 22–29)
Creatinine: 1 mg/dL (ref 0.7–1.3)
EGFR: >90 mL/min/{1.73_m2} (ref >90)
Glucose: 107 mg/dl (ref 70–140)
POTASSIUM: 4.3 meq/L (ref 3.5–5.1)
Sodium: 137 mEq/L (ref 136–145)
Total Bilirubin: 0.44 mg/dL (ref 0.20–1.20)
Total Protein: 6.9 g/dL (ref 6.4–8.3)

## 2016-10-24 NOTE — Telephone Encounter (Signed)
R/s appt per 8/20 los - left message with new appt time.

## 2016-10-25 ENCOUNTER — Ambulatory Visit
Admission: RE | Admit: 2016-10-25 | Discharge: 2016-10-25 | Disposition: A | Discharge status: Home / Self Care | Payer: 59 | Source: Ambulatory Visit | Attending: Radiation Oncology | Admitting: Radiation Oncology

## 2016-10-25 DIAGNOSIS — Z7982 Long term (current) use of aspirin: Secondary | ICD-10-CM | POA: Diagnosis not present

## 2016-10-25 DIAGNOSIS — F17210 Nicotine dependence, cigarettes, uncomplicated: Secondary | ICD-10-CM | POA: Diagnosis not present

## 2016-10-25 DIAGNOSIS — E611 Iron deficiency: Secondary | ICD-10-CM | POA: Diagnosis not present

## 2016-10-25 DIAGNOSIS — H5462 Unqualified visual loss, left eye, normal vision right eye: Secondary | ICD-10-CM | POA: Diagnosis not present

## 2016-10-25 DIAGNOSIS — I10 Essential (primary) hypertension: Secondary | ICD-10-CM | POA: Diagnosis not present

## 2016-10-25 DIAGNOSIS — C20 Malignant neoplasm of rectum: Secondary | ICD-10-CM | POA: Diagnosis not present

## 2016-10-25 DIAGNOSIS — Z79899 Other long term (current) drug therapy: Secondary | ICD-10-CM | POA: Diagnosis not present

## 2016-10-25 DIAGNOSIS — Z510 Encounter for antineoplastic radiation therapy: Secondary | ICD-10-CM | POA: Diagnosis not present

## 2016-10-25 DIAGNOSIS — Z86718 Personal history of other venous thrombosis and embolism: Secondary | ICD-10-CM | POA: Diagnosis not present

## 2016-10-26 ENCOUNTER — Ambulatory Visit
Admission: RE | Admit: 2016-10-26 | Discharge: 2016-10-26 | Disposition: A | Discharge status: Home / Self Care | Payer: 59 | Source: Ambulatory Visit | Attending: Radiation Oncology | Admitting: Radiation Oncology

## 2016-10-26 DIAGNOSIS — Z510 Encounter for antineoplastic radiation therapy: Secondary | ICD-10-CM | POA: Diagnosis not present

## 2016-10-26 DIAGNOSIS — E611 Iron deficiency: Secondary | ICD-10-CM | POA: Diagnosis not present

## 2016-10-26 DIAGNOSIS — C20 Malignant neoplasm of rectum: Secondary | ICD-10-CM | POA: Diagnosis not present

## 2016-10-26 DIAGNOSIS — Z7982 Long term (current) use of aspirin: Secondary | ICD-10-CM | POA: Diagnosis not present

## 2016-10-26 DIAGNOSIS — H5462 Unqualified visual loss, left eye, normal vision right eye: Secondary | ICD-10-CM | POA: Diagnosis not present

## 2016-10-26 DIAGNOSIS — Z86718 Personal history of other venous thrombosis and embolism: Secondary | ICD-10-CM | POA: Diagnosis not present

## 2016-10-26 DIAGNOSIS — F17210 Nicotine dependence, cigarettes, uncomplicated: Secondary | ICD-10-CM | POA: Diagnosis not present

## 2016-10-26 DIAGNOSIS — Z79899 Other long term (current) drug therapy: Secondary | ICD-10-CM | POA: Diagnosis not present

## 2016-10-26 DIAGNOSIS — I10 Essential (primary) hypertension: Secondary | ICD-10-CM | POA: Diagnosis not present

## 2016-10-26 MED FILL — CAPECITABINE 500 MG TABLET: 500 | 8 days supply | Qty: 56 | Fill #1

## 2016-10-27 ENCOUNTER — Ambulatory Visit
Admission: RE | Admit: 2016-10-27 | Discharge: 2016-10-27 | Disposition: A | Discharge status: Home / Self Care | Payer: 59 | Source: Ambulatory Visit | Attending: Radiation Oncology | Admitting: Radiation Oncology

## 2016-10-27 DIAGNOSIS — F17210 Nicotine dependence, cigarettes, uncomplicated: Secondary | ICD-10-CM | POA: Diagnosis not present

## 2016-10-27 DIAGNOSIS — H5462 Unqualified visual loss, left eye, normal vision right eye: Secondary | ICD-10-CM | POA: Diagnosis not present

## 2016-10-27 DIAGNOSIS — E611 Iron deficiency: Secondary | ICD-10-CM | POA: Diagnosis not present

## 2016-10-27 DIAGNOSIS — Z79899 Other long term (current) drug therapy: Secondary | ICD-10-CM | POA: Diagnosis not present

## 2016-10-27 DIAGNOSIS — Z86718 Personal history of other venous thrombosis and embolism: Secondary | ICD-10-CM | POA: Diagnosis not present

## 2016-10-27 DIAGNOSIS — Z510 Encounter for antineoplastic radiation therapy: Secondary | ICD-10-CM | POA: Diagnosis not present

## 2016-10-27 DIAGNOSIS — I10 Essential (primary) hypertension: Secondary | ICD-10-CM | POA: Diagnosis not present

## 2016-10-27 DIAGNOSIS — C20 Malignant neoplasm of rectum: Secondary | ICD-10-CM | POA: Diagnosis not present

## 2016-10-27 DIAGNOSIS — Z7982 Long term (current) use of aspirin: Secondary | ICD-10-CM | POA: Diagnosis not present

## 2016-10-28 ENCOUNTER — Ambulatory Visit
Admission: RE | Admit: 2016-10-28 | Discharge: 2016-10-28 | Disposition: A | Discharge status: Home / Self Care | Payer: 59 | Source: Ambulatory Visit | Attending: Radiation Oncology | Admitting: Radiation Oncology

## 2016-10-28 DIAGNOSIS — Z7982 Long term (current) use of aspirin: Secondary | ICD-10-CM | POA: Diagnosis not present

## 2016-10-28 DIAGNOSIS — Z510 Encounter for antineoplastic radiation therapy: Secondary | ICD-10-CM | POA: Diagnosis not present

## 2016-10-28 DIAGNOSIS — Z86718 Personal history of other venous thrombosis and embolism: Secondary | ICD-10-CM | POA: Diagnosis not present

## 2016-10-28 DIAGNOSIS — Z79899 Other long term (current) drug therapy: Secondary | ICD-10-CM | POA: Diagnosis not present

## 2016-10-28 DIAGNOSIS — C20 Malignant neoplasm of rectum: Secondary | ICD-10-CM | POA: Diagnosis not present

## 2016-10-28 DIAGNOSIS — H5462 Unqualified visual loss, left eye, normal vision right eye: Secondary | ICD-10-CM | POA: Diagnosis not present

## 2016-10-28 DIAGNOSIS — E611 Iron deficiency: Secondary | ICD-10-CM | POA: Diagnosis not present

## 2016-10-28 DIAGNOSIS — F17210 Nicotine dependence, cigarettes, uncomplicated: Secondary | ICD-10-CM | POA: Diagnosis not present

## 2016-10-28 DIAGNOSIS — I10 Essential (primary) hypertension: Secondary | ICD-10-CM | POA: Diagnosis not present

## 2016-10-29 ENCOUNTER — Encounter: Payer: Self-pay | Admitting: Hematology

## 2016-10-31 ENCOUNTER — Other Ambulatory Visit: Payer: 59

## 2016-10-31 ENCOUNTER — Other Ambulatory Visit (HOSPITAL_BASED_OUTPATIENT_CLINIC_OR_DEPARTMENT_OTHER): Payer: 59

## 2016-10-31 ENCOUNTER — Ambulatory Visit
Admission: RE | Admit: 2016-10-31 | Discharge: 2016-10-31 | Disposition: A | Discharge status: Home / Self Care | Payer: 59 | Source: Ambulatory Visit | Attending: Radiation Oncology | Admitting: Radiation Oncology

## 2016-10-31 DIAGNOSIS — E611 Iron deficiency: Secondary | ICD-10-CM | POA: Diagnosis not present

## 2016-10-31 DIAGNOSIS — Z79899 Other long term (current) drug therapy: Secondary | ICD-10-CM | POA: Diagnosis not present

## 2016-10-31 DIAGNOSIS — Z7982 Long term (current) use of aspirin: Secondary | ICD-10-CM | POA: Diagnosis not present

## 2016-10-31 DIAGNOSIS — Z86718 Personal history of other venous thrombosis and embolism: Secondary | ICD-10-CM | POA: Diagnosis not present

## 2016-10-31 DIAGNOSIS — C20 Malignant neoplasm of rectum: Secondary | ICD-10-CM

## 2016-10-31 DIAGNOSIS — I10 Essential (primary) hypertension: Secondary | ICD-10-CM | POA: Diagnosis not present

## 2016-10-31 DIAGNOSIS — F17210 Nicotine dependence, cigarettes, uncomplicated: Secondary | ICD-10-CM | POA: Diagnosis not present

## 2016-10-31 DIAGNOSIS — H5462 Unqualified visual loss, left eye, normal vision right eye: Secondary | ICD-10-CM | POA: Diagnosis not present

## 2016-10-31 DIAGNOSIS — Z510 Encounter for antineoplastic radiation therapy: Secondary | ICD-10-CM | POA: Diagnosis not present

## 2016-10-31 LAB — CBC WITH DIFFERENTIAL/PLATELET
BASO%: 0.4 % (ref 0.0–2.0)
Basophils Absolute: 0 10*3/uL (ref 0.0–0.1)
EOS%: 4.3 % (ref 0.0–7.0)
Eosinophils Absolute: 0.3 10*3/uL (ref 0.0–0.5)
HCT: 35.3 % — ABNORMAL LOW (ref 38.4–49.9)
HGB: 11.8 g/dL — ABNORMAL LOW (ref 13.0–17.1)
LYMPH%: 13.3 % — AB (ref 14.0–49.0)
MCH: 29.9 pg (ref 27.2–33.4)
MCHC: 33.4 g/dL (ref 32.0–36.0)
MCV: 89.3 fL (ref 79.3–98.0)
MONO#: 0.6 10*3/uL (ref 0.1–0.9)
MONO%: 8.9 % (ref 0.0–14.0)
NEUT%: 73.1 % (ref 39.0–75.0)
NEUTROS ABS: 4.6 10*3/uL (ref 1.5–6.5)
PLATELETS: 195 10*3/uL (ref 140–400)
RBC: 3.96 10*6/uL — AB (ref 4.20–5.82)
RDW: 14 % (ref 11.0–14.6)
WBC: 6.3 10*3/uL (ref 4.0–10.3)
lymph#: 0.8 10*3/uL — ABNORMAL LOW (ref 0.9–3.3)

## 2016-10-31 LAB — COMPREHENSIVE METABOLIC PANEL
ALT: 13 U/L (ref 0–55)
AST: 17 U/L (ref 5–34)
Albumin: 3.5 g/dL (ref 3.5–5.0)
Alkaline Phosphatase: 76 U/L (ref 40–150)
Anion Gap: 6 mEq/L (ref 3–11)
BILIRUBIN TOTAL: 0.23 mg/dL (ref 0.20–1.20)
BUN: 14.2 mg/dL (ref 7.0–26.0)
CO2: 26 mEq/L (ref 22–29)
CREATININE: 0.9 mg/dL (ref 0.7–1.3)
Calcium: 9.5 mg/dL (ref 8.4–10.4)
Chloride: 106 mEq/L (ref 98–109)
EGFR: >90 mL/min/{1.73_m2} (ref >90)
GLUCOSE: 109 mg/dL (ref 70–140)
Potassium: 4 mEq/L (ref 3.5–5.1)
SODIUM: 138 meq/L (ref 136–145)
TOTAL PROTEIN: 6.6 g/dL (ref 6.4–8.3)

## 2016-11-01 ENCOUNTER — Ambulatory Visit
Admission: RE | Admit: 2016-11-01 | Discharge: 2016-11-01 | Disposition: A | Discharge status: Home / Self Care | Payer: 59 | Source: Ambulatory Visit | Attending: Radiation Oncology | Admitting: Radiation Oncology

## 2016-11-01 DIAGNOSIS — Z86718 Personal history of other venous thrombosis and embolism: Secondary | ICD-10-CM | POA: Diagnosis not present

## 2016-11-01 DIAGNOSIS — Z7982 Long term (current) use of aspirin: Secondary | ICD-10-CM | POA: Diagnosis not present

## 2016-11-01 DIAGNOSIS — I10 Essential (primary) hypertension: Secondary | ICD-10-CM | POA: Diagnosis not present

## 2016-11-01 DIAGNOSIS — Z79899 Other long term (current) drug therapy: Secondary | ICD-10-CM | POA: Diagnosis not present

## 2016-11-01 DIAGNOSIS — H5462 Unqualified visual loss, left eye, normal vision right eye: Secondary | ICD-10-CM | POA: Diagnosis not present

## 2016-11-01 DIAGNOSIS — Z510 Encounter for antineoplastic radiation therapy: Secondary | ICD-10-CM | POA: Diagnosis not present

## 2016-11-01 DIAGNOSIS — E611 Iron deficiency: Secondary | ICD-10-CM | POA: Diagnosis not present

## 2016-11-01 DIAGNOSIS — C20 Malignant neoplasm of rectum: Secondary | ICD-10-CM | POA: Diagnosis not present

## 2016-11-01 DIAGNOSIS — F17210 Nicotine dependence, cigarettes, uncomplicated: Secondary | ICD-10-CM | POA: Diagnosis not present

## 2016-11-02 ENCOUNTER — Ambulatory Visit
Admission: RE | Admit: 2016-11-02 | Discharge: 2016-11-02 | Disposition: A | Discharge status: Home / Self Care | Payer: 59 | Source: Ambulatory Visit | Attending: Radiation Oncology | Admitting: Radiation Oncology

## 2016-11-02 DIAGNOSIS — C20 Malignant neoplasm of rectum: Secondary | ICD-10-CM | POA: Diagnosis not present

## 2016-11-02 DIAGNOSIS — I10 Essential (primary) hypertension: Secondary | ICD-10-CM | POA: Diagnosis not present

## 2016-11-02 DIAGNOSIS — F17210 Nicotine dependence, cigarettes, uncomplicated: Secondary | ICD-10-CM | POA: Diagnosis not present

## 2016-11-02 DIAGNOSIS — E611 Iron deficiency: Secondary | ICD-10-CM | POA: Diagnosis not present

## 2016-11-02 DIAGNOSIS — Z510 Encounter for antineoplastic radiation therapy: Secondary | ICD-10-CM | POA: Diagnosis not present

## 2016-11-02 DIAGNOSIS — Z79899 Other long term (current) drug therapy: Secondary | ICD-10-CM | POA: Diagnosis not present

## 2016-11-02 DIAGNOSIS — Z86718 Personal history of other venous thrombosis and embolism: Secondary | ICD-10-CM | POA: Diagnosis not present

## 2016-11-02 DIAGNOSIS — H5462 Unqualified visual loss, left eye, normal vision right eye: Secondary | ICD-10-CM | POA: Diagnosis not present

## 2016-11-02 DIAGNOSIS — Z7982 Long term (current) use of aspirin: Secondary | ICD-10-CM | POA: Diagnosis not present

## 2016-11-03 ENCOUNTER — Ambulatory Visit
Admission: RE | Admit: 2016-11-03 | Discharge: 2016-11-03 | Disposition: A | Discharge status: Home / Self Care | Payer: 59 | Source: Ambulatory Visit | Attending: Radiation Oncology | Admitting: Radiation Oncology

## 2016-11-03 DIAGNOSIS — Z86718 Personal history of other venous thrombosis and embolism: Secondary | ICD-10-CM | POA: Diagnosis not present

## 2016-11-03 DIAGNOSIS — C20 Malignant neoplasm of rectum: Secondary | ICD-10-CM | POA: Diagnosis not present

## 2016-11-03 DIAGNOSIS — Z510 Encounter for antineoplastic radiation therapy: Secondary | ICD-10-CM | POA: Diagnosis not present

## 2016-11-03 DIAGNOSIS — I10 Essential (primary) hypertension: Secondary | ICD-10-CM | POA: Diagnosis not present

## 2016-11-03 DIAGNOSIS — Z79899 Other long term (current) drug therapy: Secondary | ICD-10-CM | POA: Diagnosis not present

## 2016-11-03 DIAGNOSIS — F17210 Nicotine dependence, cigarettes, uncomplicated: Secondary | ICD-10-CM | POA: Diagnosis not present

## 2016-11-03 DIAGNOSIS — E611 Iron deficiency: Secondary | ICD-10-CM | POA: Diagnosis not present

## 2016-11-03 DIAGNOSIS — Z7982 Long term (current) use of aspirin: Secondary | ICD-10-CM | POA: Diagnosis not present

## 2016-11-03 DIAGNOSIS — H5462 Unqualified visual loss, left eye, normal vision right eye: Secondary | ICD-10-CM | POA: Diagnosis not present

## 2016-11-03 MED FILL — HYDROCHLOROTHIAZIDE 50 MG T: 50 | 90 days supply | Qty: 45 | Fill #0

## 2016-11-04 ENCOUNTER — Ambulatory Visit
Admission: RE | Admit: 2016-11-04 | Discharge: 2016-11-04 | Disposition: A | Discharge status: Home / Self Care | Payer: 59 | Source: Ambulatory Visit | Attending: Radiation Oncology | Admitting: Radiation Oncology

## 2016-11-04 DIAGNOSIS — I10 Essential (primary) hypertension: Secondary | ICD-10-CM | POA: Diagnosis not present

## 2016-11-04 DIAGNOSIS — Z510 Encounter for antineoplastic radiation therapy: Secondary | ICD-10-CM | POA: Diagnosis not present

## 2016-11-04 DIAGNOSIS — Z79899 Other long term (current) drug therapy: Secondary | ICD-10-CM | POA: Diagnosis not present

## 2016-11-04 DIAGNOSIS — H5462 Unqualified visual loss, left eye, normal vision right eye: Secondary | ICD-10-CM | POA: Diagnosis not present

## 2016-11-04 DIAGNOSIS — E611 Iron deficiency: Secondary | ICD-10-CM | POA: Diagnosis not present

## 2016-11-04 DIAGNOSIS — C20 Malignant neoplasm of rectum: Secondary | ICD-10-CM | POA: Diagnosis not present

## 2016-11-04 DIAGNOSIS — Z7982 Long term (current) use of aspirin: Secondary | ICD-10-CM | POA: Diagnosis not present

## 2016-11-04 DIAGNOSIS — F17210 Nicotine dependence, cigarettes, uncomplicated: Secondary | ICD-10-CM | POA: Diagnosis not present

## 2016-11-04 DIAGNOSIS — Z86718 Personal history of other venous thrombosis and embolism: Secondary | ICD-10-CM | POA: Diagnosis not present

## 2016-11-08 ENCOUNTER — Ambulatory Visit
Admission: RE | Admit: 2016-11-08 | Discharge: 2016-11-08 | Disposition: A | Discharge status: Home / Self Care | Payer: 59 | Source: Ambulatory Visit | Attending: Radiation Oncology | Admitting: Radiation Oncology

## 2016-11-08 ENCOUNTER — Other Ambulatory Visit (HOSPITAL_BASED_OUTPATIENT_CLINIC_OR_DEPARTMENT_OTHER): Payer: 59

## 2016-11-08 ENCOUNTER — Ambulatory Visit (HOSPITAL_BASED_OUTPATIENT_CLINIC_OR_DEPARTMENT_OTHER): Payer: 59 | Admitting: Nurse Practitioner

## 2016-11-08 VITALS — BP 137/87 | HR 72 | Temp 98.3°F | Resp 18 | Ht 70.5 in | Wt 130.5 lb

## 2016-11-08 DIAGNOSIS — C20 Malignant neoplasm of rectum: Secondary | ICD-10-CM | POA: Diagnosis not present

## 2016-11-08 DIAGNOSIS — I10 Essential (primary) hypertension: Secondary | ICD-10-CM | POA: Diagnosis not present

## 2016-11-08 DIAGNOSIS — Z510 Encounter for antineoplastic radiation therapy: Secondary | ICD-10-CM | POA: Diagnosis not present

## 2016-11-08 DIAGNOSIS — Z7982 Long term (current) use of aspirin: Secondary | ICD-10-CM | POA: Diagnosis not present

## 2016-11-08 DIAGNOSIS — H5462 Unqualified visual loss, left eye, normal vision right eye: Secondary | ICD-10-CM | POA: Diagnosis not present

## 2016-11-08 DIAGNOSIS — R634 Abnormal weight loss: Secondary | ICD-10-CM

## 2016-11-08 DIAGNOSIS — E611 Iron deficiency: Secondary | ICD-10-CM | POA: Diagnosis not present

## 2016-11-08 DIAGNOSIS — R197 Diarrhea, unspecified: Secondary | ICD-10-CM | POA: Diagnosis not present

## 2016-11-08 DIAGNOSIS — Z79899 Other long term (current) drug therapy: Secondary | ICD-10-CM | POA: Diagnosis not present

## 2016-11-08 DIAGNOSIS — Z86718 Personal history of other venous thrombosis and embolism: Secondary | ICD-10-CM | POA: Diagnosis not present

## 2016-11-08 DIAGNOSIS — F17210 Nicotine dependence, cigarettes, uncomplicated: Secondary | ICD-10-CM | POA: Diagnosis not present

## 2016-11-08 LAB — CBC WITH DIFFERENTIAL/PLATELET
BASO%: 0.3 % (ref 0.0–2.0)
Basophils Absolute: 0 10*3/uL (ref 0.0–0.1)
EOS ABS: 0.2 10*3/uL (ref 0.0–0.5)
EOS%: 3 % (ref 0.0–7.0)
HCT: 37 % — ABNORMAL LOW (ref 38.4–49.9)
HGB: 12.1 g/dL — ABNORMAL LOW (ref 13.0–17.1)
LYMPH%: 8.6 % — AB (ref 14.0–49.0)
MCH: 29.7 pg (ref 27.2–33.4)
MCHC: 32.9 g/dL (ref 32.0–36.0)
MCV: 90.4 fL (ref 79.3–98.0)
MONO#: 0.6 10*3/uL (ref 0.1–0.9)
MONO%: 9.1 % (ref 0.0–14.0)
NEUT#: 5.3 10*3/uL (ref 1.5–6.5)
NEUT%: 79 % — AB (ref 39.0–75.0)
PLATELETS: 256 10*3/uL (ref 140–400)
RBC: 4.09 10*6/uL — AB (ref 4.20–5.82)
RDW: 14.4 % (ref 11.0–14.6)
WBC: 6.7 10*3/uL (ref 4.0–10.3)
lymph#: 0.6 10*3/uL — ABNORMAL LOW (ref 0.9–3.3)

## 2016-11-08 LAB — COMPREHENSIVE METABOLIC PANEL
ALT: 12 U/L (ref 0–55)
ANION GAP: 6 meq/L (ref 3–11)
AST: 17 U/L (ref 5–34)
Albumin: 3.6 g/dL (ref 3.5–5.0)
Alkaline Phosphatase: 72 U/L (ref 40–150)
BUN: 12.6 mg/dL (ref 7.0–26.0)
CHLORIDE: 104 meq/L (ref 98–109)
CO2: 28 meq/L (ref 22–29)
Calcium: 9.6 mg/dL (ref 8.4–10.4)
Creatinine: 1 mg/dL (ref 0.7–1.3)
Glucose: 103 mg/dl (ref 70–140)
Potassium: 3.9 mEq/L (ref 3.5–5.1)
Sodium: 138 mEq/L (ref 136–145)
Total Bilirubin: 0.27 mg/dL (ref 0.20–1.20)
Total Protein: 7 g/dL (ref 6.4–8.3)

## 2016-11-08 NOTE — Progress Notes (Signed)
  [facility] OFFICE PROGRESS NOTE   Diagnosis:  Rectal cancer SUMMARY OF ONCOLOGIC HISTORY:       Rectal adenocarcinoma ([place])   09/01/2016 Initial Diagnosis    Rectal adenocarcinoma ([place])      09/01/2016 Surgery    Hemorrhoidectomy 09/01/2016. Noted to have a mobile right lateral anal canal mass. Dr. Leighton Ruff.       09/01/2016 Pathology Results    Invasive adenocarcinoma, well-differentiated, spanning 1.3 cm. The tumor invaded into the anal sphincter muscle. Resection margins were negative. The pathologist notes the tumor would be best staged as pT2 given involvement of muscle.       09/06/2016 Imaging    CT scans chest/abdomen/pelvis 09/06/2016 showed no evidence of metastatic disease.       09/22/2016 Imaging    Pelvic MRI 09/22/2016 showed no definite residual anal tumor. There was no evidence of tumor involvement of the external sphincter or adjacent organs. There was no pelvic lymphadenopathy.      10/10/2016 -  Radiation Therapy    Patient begun adjuvant radiation with Dr. Lisbeth Renshaw      10/10/2016 -  Chemotherapy    xeloda 500mg , twice daily.       INTERVAL HISTORY:   Juan David returns as scheduled. He continues radiation/Xeloda. He denies nausea. No mouth sores. He has intermittent loose stools. No hand or foot pain or redness. He has pain at the rectum. He is utilizing sitz baths, Desitin and Neosporin. He declines pain medication. He reports good fluid intake. He is drinking 2 cans of ensure nutritional supplement a day. He notes intermittent urinary urgency/frequency. No fever.  Objective:  Vital signs in last 24 hours:  Blood pressure 137/87, pulse 72, temperature 98.3 F (36.8 C), temperature source Oral, resp. rate 18, height 5' 10.5" (1.791 m), weight 130 lb 8 oz (59.2 kg), SpO2 99 %.    HEENT: White coating over tongue. No ulcers. Resp: Lungs clear bilaterally. Cardio: Regular rate and rhythm. GI:  Abdomen soft and nontender. No hepatomegaly. Vascular: No leg edema. Skin: Palms without erythema. Scattered small areas of superficial ulceration at the perianal region.    Lab Results:  Lab Results  Component Value Date   WBC 6.7 11/08/2016   HGB 12.1 (L) 11/08/2016   HCT 37.0 (L) 11/08/2016   MCV 90.4 11/08/2016   PLT 256 11/08/2016   NEUTROABS 5.3 11/08/2016    Imaging:  No results found.  Medications: I have reviewed the patient's current medications.  Assessment/Plan: 1. Adenocarcinoma of the distal rectum, pT2cN0M0, stage I -He is status post right lateral anal/low rectum mass resection by Dr. Marcello Moores. His surgical path  showed a T2 tumor, invasive adenocarcinoma. Pelvic MRI showed no residual tumor and no adenopathy. Standard surgery APR was discussed with patient, he declined. 2.  Weight loss and diarrhea. Weight stable 11/08/2016. Diarrhea is intermittent alternating with constipation.   Disposition: Juan David appears stable. He continues radiation/Xeloda. We will see him in follow-up in 2 weeks. He will contact the office in the interim with any problems.  He will continue symptomatic treatment of the skin toxicity involving the perianal region/perineum. He declines pain medication.    Ned Card ANP/GNP-BC   11/08/2016  11:20 AM

## 2016-11-09 ENCOUNTER — Telehealth: Payer: Self-pay | Admitting: Hematology

## 2016-11-09 ENCOUNTER — Ambulatory Visit
Admission: RE | Admit: 2016-11-09 | Discharge: 2016-11-09 | Disposition: A | Discharge status: Home / Self Care | Payer: 59 | Source: Ambulatory Visit | Attending: Radiation Oncology | Admitting: Radiation Oncology

## 2016-11-09 DIAGNOSIS — H5462 Unqualified visual loss, left eye, normal vision right eye: Secondary | ICD-10-CM | POA: Diagnosis not present

## 2016-11-09 DIAGNOSIS — F17210 Nicotine dependence, cigarettes, uncomplicated: Secondary | ICD-10-CM | POA: Diagnosis not present

## 2016-11-09 DIAGNOSIS — E611 Iron deficiency: Secondary | ICD-10-CM | POA: Diagnosis not present

## 2016-11-09 DIAGNOSIS — I10 Essential (primary) hypertension: Secondary | ICD-10-CM | POA: Diagnosis not present

## 2016-11-09 DIAGNOSIS — Z7982 Long term (current) use of aspirin: Secondary | ICD-10-CM | POA: Diagnosis not present

## 2016-11-09 DIAGNOSIS — Z86718 Personal history of other venous thrombosis and embolism: Secondary | ICD-10-CM | POA: Diagnosis not present

## 2016-11-09 DIAGNOSIS — C20 Malignant neoplasm of rectum: Secondary | ICD-10-CM | POA: Diagnosis not present

## 2016-11-09 DIAGNOSIS — Z510 Encounter for antineoplastic radiation therapy: Secondary | ICD-10-CM | POA: Diagnosis not present

## 2016-11-09 DIAGNOSIS — Z79899 Other long term (current) drug therapy: Secondary | ICD-10-CM | POA: Diagnosis not present

## 2016-11-09 NOTE — Telephone Encounter (Signed)
No 9/4 los. °

## 2016-11-10 ENCOUNTER — Ambulatory Visit
Admission: RE | Admit: 2016-11-10 | Discharge: 2016-11-10 | Disposition: A | Discharge status: Home / Self Care | Payer: 59 | Source: Ambulatory Visit | Attending: Radiation Oncology | Admitting: Radiation Oncology

## 2016-11-10 DIAGNOSIS — Z86718 Personal history of other venous thrombosis and embolism: Secondary | ICD-10-CM | POA: Diagnosis not present

## 2016-11-10 DIAGNOSIS — C20 Malignant neoplasm of rectum: Secondary | ICD-10-CM | POA: Diagnosis not present

## 2016-11-10 DIAGNOSIS — Z79899 Other long term (current) drug therapy: Secondary | ICD-10-CM | POA: Diagnosis not present

## 2016-11-10 DIAGNOSIS — E611 Iron deficiency: Secondary | ICD-10-CM | POA: Diagnosis not present

## 2016-11-10 DIAGNOSIS — Z7982 Long term (current) use of aspirin: Secondary | ICD-10-CM | POA: Diagnosis not present

## 2016-11-10 DIAGNOSIS — I10 Essential (primary) hypertension: Secondary | ICD-10-CM | POA: Diagnosis not present

## 2016-11-10 DIAGNOSIS — Z510 Encounter for antineoplastic radiation therapy: Secondary | ICD-10-CM | POA: Diagnosis not present

## 2016-11-10 DIAGNOSIS — H5462 Unqualified visual loss, left eye, normal vision right eye: Secondary | ICD-10-CM | POA: Diagnosis not present

## 2016-11-10 DIAGNOSIS — F17210 Nicotine dependence, cigarettes, uncomplicated: Secondary | ICD-10-CM | POA: Diagnosis not present

## 2016-11-11 ENCOUNTER — Ambulatory Visit
Admission: RE | Admit: 2016-11-11 | Discharge: 2016-11-11 | Disposition: A | Discharge status: Home / Self Care | Payer: 59 | Source: Ambulatory Visit | Attending: Radiation Oncology | Admitting: Radiation Oncology

## 2016-11-11 DIAGNOSIS — H5462 Unqualified visual loss, left eye, normal vision right eye: Secondary | ICD-10-CM | POA: Diagnosis not present

## 2016-11-11 DIAGNOSIS — Z7982 Long term (current) use of aspirin: Secondary | ICD-10-CM | POA: Diagnosis not present

## 2016-11-11 DIAGNOSIS — E611 Iron deficiency: Secondary | ICD-10-CM | POA: Diagnosis not present

## 2016-11-11 DIAGNOSIS — Z79899 Other long term (current) drug therapy: Secondary | ICD-10-CM | POA: Diagnosis not present

## 2016-11-11 DIAGNOSIS — F17210 Nicotine dependence, cigarettes, uncomplicated: Secondary | ICD-10-CM | POA: Diagnosis not present

## 2016-11-11 DIAGNOSIS — C20 Malignant neoplasm of rectum: Secondary | ICD-10-CM | POA: Diagnosis not present

## 2016-11-11 DIAGNOSIS — I10 Essential (primary) hypertension: Secondary | ICD-10-CM | POA: Diagnosis not present

## 2016-11-11 DIAGNOSIS — Z86718 Personal history of other venous thrombosis and embolism: Secondary | ICD-10-CM | POA: Diagnosis not present

## 2016-11-11 DIAGNOSIS — Z510 Encounter for antineoplastic radiation therapy: Secondary | ICD-10-CM | POA: Diagnosis not present

## 2016-11-14 ENCOUNTER — Other Ambulatory Visit (HOSPITAL_BASED_OUTPATIENT_CLINIC_OR_DEPARTMENT_OTHER): Payer: 59

## 2016-11-14 ENCOUNTER — Ambulatory Visit
Admission: RE | Admit: 2016-11-14 | Discharge: 2016-11-14 | Disposition: A | Discharge status: Home / Self Care | Payer: 59 | Source: Ambulatory Visit | Attending: Radiation Oncology | Admitting: Radiation Oncology

## 2016-11-14 DIAGNOSIS — F17210 Nicotine dependence, cigarettes, uncomplicated: Secondary | ICD-10-CM | POA: Diagnosis not present

## 2016-11-14 DIAGNOSIS — C20 Malignant neoplasm of rectum: Secondary | ICD-10-CM

## 2016-11-14 DIAGNOSIS — H5462 Unqualified visual loss, left eye, normal vision right eye: Secondary | ICD-10-CM | POA: Diagnosis not present

## 2016-11-14 DIAGNOSIS — Z86718 Personal history of other venous thrombosis and embolism: Secondary | ICD-10-CM | POA: Diagnosis not present

## 2016-11-14 DIAGNOSIS — Z510 Encounter for antineoplastic radiation therapy: Secondary | ICD-10-CM | POA: Diagnosis not present

## 2016-11-14 DIAGNOSIS — Z7982 Long term (current) use of aspirin: Secondary | ICD-10-CM | POA: Diagnosis not present

## 2016-11-14 DIAGNOSIS — I10 Essential (primary) hypertension: Secondary | ICD-10-CM | POA: Diagnosis not present

## 2016-11-14 DIAGNOSIS — E611 Iron deficiency: Secondary | ICD-10-CM | POA: Diagnosis not present

## 2016-11-14 DIAGNOSIS — Z79899 Other long term (current) drug therapy: Secondary | ICD-10-CM | POA: Diagnosis not present

## 2016-11-14 LAB — CBC WITH DIFFERENTIAL/PLATELET
BASO%: 0 % (ref 0.0–2.0)
Basophils Absolute: 0 10*3/uL (ref 0.0–0.1)
EOS ABS: 0.2 10*3/uL (ref 0.0–0.5)
EOS%: 3.1 % (ref 0.0–7.0)
HEMATOCRIT: 37 % — AB (ref 38.4–49.9)
HGB: 12.2 g/dL — ABNORMAL LOW (ref 13.0–17.1)
LYMPH#: 0.6 10*3/uL — AB (ref 0.9–3.3)
LYMPH%: 10.3 % — ABNORMAL LOW (ref 14.0–49.0)
MCH: 29.3 pg (ref 27.2–33.4)
MCHC: 33 g/dL (ref 32.0–36.0)
MCV: 88.7 fL (ref 79.3–98.0)
MONO#: 0.5 10*3/uL (ref 0.1–0.9)
MONO%: 7.7 % (ref 0.0–14.0)
NEUT%: 78.9 % — AB (ref 39.0–75.0)
NEUTROS ABS: 4.6 10*3/uL (ref 1.5–6.5)
PLATELETS: 238 10*3/uL (ref 140–400)
RBC: 4.17 10*6/uL — ABNORMAL LOW (ref 4.20–5.82)
RDW: 16.1 % — ABNORMAL HIGH (ref 11.0–14.6)
WBC: 5.8 10*3/uL (ref 4.0–10.3)

## 2016-11-14 LAB — COMPREHENSIVE METABOLIC PANEL
ALBUMIN: 3.8 g/dL (ref 3.5–5.0)
ALK PHOS: 78 U/L (ref 40–150)
ALT: 12 U/L (ref 0–55)
ANION GAP: 7 meq/L (ref 3–11)
AST: 17 U/L (ref 5–34)
BILIRUBIN TOTAL: 0.37 mg/dL (ref 0.20–1.20)
BUN: 15.8 mg/dL (ref 7.0–26.0)
CALCIUM: 9.6 mg/dL (ref 8.4–10.4)
CO2: 26 mEq/L (ref 22–29)
CREATININE: 0.9 mg/dL (ref 0.7–1.3)
Chloride: 104 mEq/L (ref 98–109)
EGFR: >90 mL/min/{1.73_m2} (ref >90)
Glucose: 82 mg/dl (ref 70–140)
Potassium: 4.1 mEq/L (ref 3.5–5.1)
Sodium: 137 mEq/L (ref 136–145)
TOTAL PROTEIN: 7.3 g/dL (ref 6.4–8.3)

## 2016-11-15 ENCOUNTER — Ambulatory Visit: Payer: 59

## 2016-11-15 ENCOUNTER — Ambulatory Visit
Admission: RE | Admit: 2016-11-15 | Discharge: 2016-11-15 | Disposition: A | Discharge status: Home / Self Care | Payer: 59 | Source: Ambulatory Visit | Attending: Radiation Oncology | Admitting: Radiation Oncology

## 2016-11-15 DIAGNOSIS — E611 Iron deficiency: Secondary | ICD-10-CM | POA: Diagnosis not present

## 2016-11-15 DIAGNOSIS — Z86718 Personal history of other venous thrombosis and embolism: Secondary | ICD-10-CM | POA: Diagnosis not present

## 2016-11-15 DIAGNOSIS — Z510 Encounter for antineoplastic radiation therapy: Secondary | ICD-10-CM | POA: Diagnosis not present

## 2016-11-15 DIAGNOSIS — I10 Essential (primary) hypertension: Secondary | ICD-10-CM | POA: Diagnosis not present

## 2016-11-15 DIAGNOSIS — Z7982 Long term (current) use of aspirin: Secondary | ICD-10-CM | POA: Diagnosis not present

## 2016-11-15 DIAGNOSIS — Z79899 Other long term (current) drug therapy: Secondary | ICD-10-CM | POA: Diagnosis not present

## 2016-11-15 DIAGNOSIS — F17210 Nicotine dependence, cigarettes, uncomplicated: Secondary | ICD-10-CM | POA: Diagnosis not present

## 2016-11-15 DIAGNOSIS — H5462 Unqualified visual loss, left eye, normal vision right eye: Secondary | ICD-10-CM | POA: Diagnosis not present

## 2016-11-15 DIAGNOSIS — C20 Malignant neoplasm of rectum: Secondary | ICD-10-CM | POA: Diagnosis not present

## 2016-11-16 ENCOUNTER — Ambulatory Visit: Payer: 59

## 2016-11-16 ENCOUNTER — Ambulatory Visit
Admission: RE | Admit: 2016-11-16 | Discharge: 2016-11-16 | Disposition: A | Discharge status: Home / Self Care | Payer: 59 | Source: Ambulatory Visit | Attending: Radiation Oncology | Admitting: Radiation Oncology

## 2016-11-16 DIAGNOSIS — Z510 Encounter for antineoplastic radiation therapy: Secondary | ICD-10-CM | POA: Diagnosis not present

## 2016-11-16 DIAGNOSIS — I10 Essential (primary) hypertension: Secondary | ICD-10-CM | POA: Diagnosis not present

## 2016-11-16 DIAGNOSIS — C20 Malignant neoplasm of rectum: Secondary | ICD-10-CM | POA: Diagnosis not present

## 2016-11-16 DIAGNOSIS — Z7982 Long term (current) use of aspirin: Secondary | ICD-10-CM | POA: Diagnosis not present

## 2016-11-16 DIAGNOSIS — E611 Iron deficiency: Secondary | ICD-10-CM | POA: Diagnosis not present

## 2016-11-16 DIAGNOSIS — Z79899 Other long term (current) drug therapy: Secondary | ICD-10-CM | POA: Diagnosis not present

## 2016-11-16 DIAGNOSIS — Z86718 Personal history of other venous thrombosis and embolism: Secondary | ICD-10-CM | POA: Diagnosis not present

## 2016-11-16 DIAGNOSIS — F17210 Nicotine dependence, cigarettes, uncomplicated: Secondary | ICD-10-CM | POA: Diagnosis not present

## 2016-11-16 DIAGNOSIS — H5462 Unqualified visual loss, left eye, normal vision right eye: Secondary | ICD-10-CM | POA: Diagnosis not present

## 2016-11-17 ENCOUNTER — Ambulatory Visit
Admission: RE | Admit: 2016-11-17 | Discharge: 2016-11-17 | Disposition: A | Discharge status: Home / Self Care | Payer: 59 | Source: Ambulatory Visit | Attending: Radiation Oncology | Admitting: Radiation Oncology

## 2016-11-17 DIAGNOSIS — Z7982 Long term (current) use of aspirin: Secondary | ICD-10-CM | POA: Diagnosis not present

## 2016-11-17 DIAGNOSIS — Z510 Encounter for antineoplastic radiation therapy: Secondary | ICD-10-CM | POA: Diagnosis not present

## 2016-11-17 DIAGNOSIS — Z86718 Personal history of other venous thrombosis and embolism: Secondary | ICD-10-CM | POA: Diagnosis not present

## 2016-11-17 DIAGNOSIS — C20 Malignant neoplasm of rectum: Secondary | ICD-10-CM | POA: Diagnosis not present

## 2016-11-17 DIAGNOSIS — Z79899 Other long term (current) drug therapy: Secondary | ICD-10-CM | POA: Diagnosis not present

## 2016-11-17 DIAGNOSIS — I10 Essential (primary) hypertension: Secondary | ICD-10-CM | POA: Diagnosis not present

## 2016-11-17 DIAGNOSIS — H5462 Unqualified visual loss, left eye, normal vision right eye: Secondary | ICD-10-CM | POA: Diagnosis not present

## 2016-11-17 DIAGNOSIS — E611 Iron deficiency: Secondary | ICD-10-CM | POA: Diagnosis not present

## 2016-11-17 DIAGNOSIS — F17210 Nicotine dependence, cigarettes, uncomplicated: Secondary | ICD-10-CM | POA: Diagnosis not present

## 2016-11-18 ENCOUNTER — Ambulatory Visit
Admission: RE | Admit: 2016-11-18 | Discharge: 2016-11-18 | Disposition: A | Discharge status: Home / Self Care | Payer: 59 | Source: Ambulatory Visit | Attending: Radiation Oncology | Admitting: Radiation Oncology

## 2016-11-18 DIAGNOSIS — E611 Iron deficiency: Secondary | ICD-10-CM | POA: Diagnosis not present

## 2016-11-18 DIAGNOSIS — C20 Malignant neoplasm of rectum: Secondary | ICD-10-CM | POA: Diagnosis not present

## 2016-11-18 DIAGNOSIS — H5462 Unqualified visual loss, left eye, normal vision right eye: Secondary | ICD-10-CM | POA: Diagnosis not present

## 2016-11-18 DIAGNOSIS — F17210 Nicotine dependence, cigarettes, uncomplicated: Secondary | ICD-10-CM | POA: Diagnosis not present

## 2016-11-18 DIAGNOSIS — Z79899 Other long term (current) drug therapy: Secondary | ICD-10-CM | POA: Diagnosis not present

## 2016-11-18 DIAGNOSIS — I10 Essential (primary) hypertension: Secondary | ICD-10-CM | POA: Diagnosis not present

## 2016-11-18 DIAGNOSIS — Z86718 Personal history of other venous thrombosis and embolism: Secondary | ICD-10-CM | POA: Diagnosis not present

## 2016-11-18 DIAGNOSIS — Z7982 Long term (current) use of aspirin: Secondary | ICD-10-CM | POA: Diagnosis not present

## 2016-11-18 DIAGNOSIS — Z510 Encounter for antineoplastic radiation therapy: Secondary | ICD-10-CM | POA: Diagnosis not present

## 2016-11-18 NOTE — Progress Notes (Signed)
[facility]  [phone] [phone]  Clinic Follow up Note   Patient Care Team: Leonard Downing, MD as PCP - General (Family Medicine) Leighton Ruff, MD as Consulting Physician (General Surgery) Kyung Rudd, MD as Consulting Physician (Radiation Oncology) Truitt Merle, MD as Consulting Physician (Hematology) 11/21/2016  Chief Complaint: Follow up rectal cancer  SUMMARY OF ONCOLOGIC HISTORY:   Rectal adenocarcinoma ([place])   09/01/2016 Initial Diagnosis    Rectal adenocarcinoma ([place])      09/01/2016 Surgery    Hemorrhoidectomy 09/01/2016. Noted to have a mobile right lateral anal canal mass. Dr. Leighton Ruff.       09/01/2016 Pathology Results    Invasive adenocarcinoma, well-differentiated, spanning 1.3 cm. The tumor invaded into the anal sphincter muscle. Resection margins were negative. The pathologist notes the tumor would be best staged as pT2 given involvement of muscle.       09/06/2016 Imaging    CT scans chest/abdomen/pelvis 09/06/2016 showed no evidence of metastatic disease.       09/22/2016 Imaging    Pelvic MRI 09/22/2016 showed no definite residual anal tumor. There was no evidence of tumor involvement of the external sphincter or adjacent organs. There was no pelvic lymphadenopathy.      10/10/2016 -  Radiation Therapy    Patient begun adjuvant radiation with Dr. Lisbeth Renshaw      10/10/2016 -  Chemotherapy    xeloda 500mg , twice daily.      HPI (Hisory of Present Illness): Juan David is a 61 y.o. male here because of a recently diagnosed rectal cancer. She initially saw Ned Card. Per her notes, patient had a 6 month history of an anal mass that progressed in size over time. He went to the ED of 07/26/2016 with complaints of rectal bleeding. He underwent a hemorrhoidectomy on 08/26/2016 and was noted to have a mobile right lateral anal canal mass that invaded the anal sphincter muscle. The pathology showed an invasive  adenocarcinoma, well-differentiated, spanning 1.3cm. The resection margins were negative. Pathologist notes that tumor would be best staged at pT2 given muscle involvement. On 09/06/2016, CT CAP showed no evidence of metastatic disease. Pelvic MRI on 11/23/2016 showed no definite anal tumor.  He has seen Dr. Lisbeth Renshaw who reccomends adjuvant chemoradiation with plans to begin 10/10/2016.  CURRENT THERAPY: Concurrent radiation and chemotherapy with Xeloda 1500 mg twice daily, started on 10/10/2016 and is to end 11/22/16  INTERVAL HISTORY: Juan David is here for a follow up as scheduled. He is currently receiving chemoradiation with Xeloda, due to complete tomorrow on 11/22/2016. He is experiencing significant constant rectal pain 9/10, but worse with sitting. He prefers to stand during the encounter today. He has immediate release oxycodone on his medication list but refuses to use this. He is not interested in other medication, as he reports pain medicine causes constipation. He fluctuates between having loose stool versus constipation, has had 4 somewhat loose stools today. He uses Imodium when necessary. He monitors his skin and reports that it is "pink, raw, wet and has breakdown." He usually leaves it open to air when he gets home. He notes occasional rectal bleeding especially with diarrhea and now with more skin breakdown. He uses topical Desitin and Neosporin per Dr. Lisbeth Renshaw. He will complete Xeloda tomorrow, tolerating well. He denies hand or foot pain or redness, no mouth sores. He denies fever, cough, chest pain, shortness of breath. He has good appetite and is eating and drinking, weight is stable.  REVIEW OF SYSTEMS:   Constitutional: Denies fevers or chills. Stable weight. Eyes: Denies blurriness of vision Ears, nose, mouth, throat, and face: Denies mucositis or sore throat Respiratory: Denies cough, dyspnea or wheezes Cardiovascular: Denies palpitation, chest discomfort or lower extremity  swelling Gastrointestinal:  Denies heartburn (+) fluctuates between constipation and diarrhea, managed with medication (+) occasional nausea. (+) Occasional rectal bleeding with diarrhea and increased skin toxicity Skin: Denies abnormal skin rashes Lymphatics: Denies new lymphadenopathy or easy bruising Neurological:Denies numbness, tingling or new weaknesses Behavioral/Psych: Mood is stable, no new changes  All other systems were reviewed with the patient and are negative.  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 at [facility] in [place]   08-18-2001   Wears glasses    SURGICAL HISTORY: Past Surgical History:  Procedure Laterality Date   ARTHROSCOPIC REPAIR ACL Right [place] LEFT EYE INJURY  08-18-2001   [facility]   ruptured globe- repair corneal laceration, reposition of prolapsed uveal tissue   HEMORRHOID SURGERY N/A 09/01/2016   Procedure: HEMORRHOIDECTOMY;  Surgeon: Leighton Ruff, MD;  Location: [facility];  Service: General;  Laterality: N/A;   SUPERFICIAL KERATECTOMY Left 06-29-2011    [facility]    with EDTA scrub of left eye   TEE WITHOUT CARDIOVERSION  05-11-2009  dr Dorris Carnes   LVSEF 55-65%/  mild thickened AV without AI/  trace MR/ mixed fixed artherosclerosis plaqueing thoracic aorta/  no evidence thrombus/  by agitated  saling and color doppler there was a PFO   I have reviewed the social history and family history with the patient and they are unchanged from previous note.  ALLERGIES:  has No Known Allergies.  MEDICATIONS:  Current Outpatient Prescriptions  Medication Sig Dispense Refill   aspirin 325 MG tablet Take 325 mg by mouth daily.     capecitabine (XELODA) 500 MG tablet Take 3 tablets (1,500 mg total) by mouth 2 (two) times daily after a meal. Take on days of radiation only 168 tablet 0   Ferrous Sulfate (IRON) 325 (65 Fe) MG TABS Take 5 tablets by mouth every morning.     hydrochlorothiazide (HYDRODIURIL) 50 MG tablet Take 25 mg by mouth every morning.   0   hypromellose (GENTEAL) 0.3 % GEL ophthalmic ointment      Multiple Vitamin (MULTI-VITAMINS) TABS Take 1 tablet by mouth daily. Adult chewable     Potassium 95 MG TABS Take 1 tablet by mouth every morning.     prednisoLONE acetate (PRED FORTE) 1 % ophthalmic suspension Place 1 drop into the left eye as needed.     timolol (TIMOPTIC) 0.5 % ophthalmic solution Place 1 drop into the left eye every morning.  11   docusate sodium (COLACE) 100 MG capsule Take 100 mg by mouth 2 (two) times daily as needed for mild constipation.     oxyCODONE (OXY IR/ROXICODONE) 5 MG immediate release tablet Take 1-2 tablets (5-10 mg total) by mouth every 6 (six) hours as needed for severe pain. (Patient not taking: Reported on 11/21/2016) 20 tablet 0   prochlorperazine (COMPAZINE) 10 MG tablet Take 1 tablet (10 mg total) by mouth every 6 (six) hours as needed for nausea or vomiting. (Patient not taking: Reported on 11/21/2016) 30 tablet 0   No current facility-administered medications for this visit.    PHYSICAL EXAMINATION:  ECOG PERFORMANCE STATUS: 2 - Symptomatic, <50% confined to bed  Vitals:   11/21/16 1002  BP: 125/78  Pulse: 78  Resp: 18  Temp: 98.5 F (36.9 C)   Filed Weights   11/21/16 1002  Weight: 130 lb 11.2 oz (59.3 kg)    GENERAL:alert, no distress. Pacing in the room SKIN: skin color, texture, turgor are normal, no rashes or significant lesions EYES: normal, Conjunctiva are pink and non-injected, sclera clear OROPHARYNX:no exudate, no erythema and lips, buccal mucosa, and tongue normal  NECK: supple, thyroid normal size, non-tender, without nodularity LYMPH:  no palpable cervical or supraclavicular lymphadenopathy  LUNGS: clear to auscultation bilaterally with normal breathing effort HEART: regular rate & rhythm, S1 and S2 present, no murmurs, no lower extremity edema ABDOMEN:abdomen soft, non-tender and normal bowel sounds Musculoskeletal:no cyanosis of digits and no clubbing  NEURO: alert & oriented x 3 with fluent speech, no focal motor/sensory deficits RECTAL: moist desquamation and ulceration to perineal area  LABORATORY DATA:  I have reviewed the data as listed CBC Latest Ref Rng & Units 11/21/2016 11/14/2016 11/08/2016  WBC 4.0 - 10.3 10e3/uL 6.1 5.8 6.7  Hemoglobin 13.0 - 17.1 g/dL 12.1(L) 12.2(L) 12.1(L)  Hematocrit 38.4 - 49.9 % 36.0(L) 37.0(L) 37.0(L)  Platelets 140 - 400 10e3/uL 220 238 256     CMP Latest Ref Rng & Units 11/21/2016 11/14/2016 11/08/2016  Glucose 70 - 140 mg/dl 90 82 103  BUN 7.0 - 26.0 mg/dL 15.5 15.8 12.6  Creatinine 0.7 - 1.3 mg/dL 1.1 0.9 1.0  Sodium 136 - 145 mEq/L 137 137 138  Potassium 3.5 - 5.1 mEq/L 4.0 4.1 3.9  Chloride 101 - 111 mmol/L - - -  CO2 22 - 29 mEq/L 26 26 28   Calcium 8.4 - 10.4 mg/dL 9.8 9.6 9.6  Total Protein 6.4 - 8.3 g/dL 7.1 7.3 7.0  Total Bilirubin 0.20 - 1.20 mg/dL 0.41 0.37 0.27  Alkaline Phos 40 - 150 U/L 75 78 72  AST 5 - 34 U/L 18 17 17   ALT 0 - 55 U/L 14 12 12    PATHOLOGY REPORT: Diagnosis 09/01/2016 Anus, biopsy, canal mass - INVASIVE ADENOCARCINOMA, WELL DIFFERENTIATED, SPANNING 1.3 CM. - TUMOR INVADES INTO ANAL SPHINCTER MUSCLE. - RESECTION MARGINS ARE NEGATIVE. Microscopic Comment Dr. Melina Copa has reviewed the case. Dr. Marcello Moores was  paged on 09/02/2016. ADDITIONAL INFORMATION: The tumor would best be staged as a pT2 given involvement of muscle.  RADIOGRAPHIC STUDIES: MR Pelvis w wo Contrast 09-24-2016 IMPRESSION: No definite residual anal tumor identified. No evidence of tumor involvement of the external sphincter or adjacent organs. No pelvic Lymphadenopathy.  CT Chest, Abdomen, Pelvic w Contrast 09/06/2016 IMPRESSION: 1. No evidence to suggest metastatic disease in the chest, abdomen or pelvis. 2. Aortic atherosclerosis, in addition to 2 vessel coronary artery disease. Please note that although the presence of coronary artery calcium  documents the presence of coronary artery disease, the severity of this disease and any potential stenosis cannot be assessed on this non-gated CT examination. Assessment for potential risk factor modification, dietary therapy or pharmacologic therapy may be warranted, if clinically indicated. 3. Additional incidental findings, as above.  I have personally reviewed the radiological images as listed and agreed with the findings in the report. No results found.   ASSESSMENT & PLAN: 61 y.o.-year-old African-American male, with past medical history of hypertension, iron deficiency, glaucoma, remote history of DVT, presented with rectal mass and rectal bleeding.  1.Adenocarcinoma of the distal rectum, pT2cN0M0, stage I -He is status post right lateral anal/low rectum mass resection by Dr. Marcello Moores. His surgical path reviewed a T2 tumor, invasive adenocarcinoma. Pelvic MRI showed no residual tumor and no adenopathy. This was reviewed with patient previously. -Standard surgery APR was discussed with patient, he declined. -Case was discussed in the tumor board, we recommend him to undergo concurrent chemoradiation, to reduce his risk of local recurrence. I recommend concurrent Xeloda 8 25 mg/m twice daily with radiation. Potential side effects discussed with patient, he agrees to  proceed. -patient begun concurrent adjuvant chemoradiation therapy 8/6. He is tolerating well with anticipated side effects - Previously advised to take nasuea pill as needed for chemotherapy related nausea. -He is to complete chemoradiation with Xeloda 11/22/16 -He has significant rectal pain but is not interested in prescription-strength medication, I recommended tylenol or ibuprofen as these are usually non-constipating, he will try -labs reviewed, mild anemia is stable -We discussed rectal cancer surveillance after he completes chemoradiation. -I will see him in 4 weeks, will send a message to Dr. Marcello Moores that he has completed chemoradiation; he will also f/u with Dr. Lisbeth Renshaw   2. Weight loss and diarrhea -Diarrhea managed with Imodium -Drinks one Ensure daily and does not eat lunch when working. Encouraged patient to drink additional Ensure daily. -weight is currently stable, will continue to monitor   3. Rectal pain  -secondary to chemo and RT -we discussed pain management, pt prefers not to take any pain meds, due to the concerns of constipation -I encouraged pt to take tylenol or NSAIDs as needed   PLAN -continue chemoradiation with Xeloda, complete 11/22/16 -lab and f/u in 4 weeks -NSAIDs or Tylenol for pain -Message to Dr. Marcello Moores informing her that he will complete chemoradiation and we recommend f/u.   All questions were answered. The patient knows to call the clinic with any problems, questions or concerns. No barriers to learning was detected.  I spent 15 minutes counseling the patient face to face. The total time spent in the appointment was 20 minutes and more than 50% was on counseling and review of test results  Cira Rue, AGNP-C 11/21/2016  I have seen the patient, examined him. I agree with the assessment and and plan and have edited the notes.   Truitt Merle  11/21/2016

## 2016-11-21 ENCOUNTER — Ambulatory Visit (HOSPITAL_BASED_OUTPATIENT_CLINIC_OR_DEPARTMENT_OTHER): Payer: 59 | Admitting: Hematology

## 2016-11-21 ENCOUNTER — Other Ambulatory Visit (HOSPITAL_BASED_OUTPATIENT_CLINIC_OR_DEPARTMENT_OTHER): Payer: 59

## 2016-11-21 ENCOUNTER — Ambulatory Visit
Admission: RE | Admit: 2016-11-21 | Discharge: 2016-11-21 | Disposition: A | Discharge status: Home / Self Care | Payer: 59 | Source: Ambulatory Visit | Attending: Radiation Oncology | Admitting: Radiation Oncology

## 2016-11-21 ENCOUNTER — Encounter: Payer: Self-pay | Admitting: Hematology

## 2016-11-21 ENCOUNTER — Telehealth: Payer: Self-pay | Admitting: Hematology

## 2016-11-21 ENCOUNTER — Ambulatory Visit: Payer: 59

## 2016-11-21 VITALS — BP 125/78 | HR 78 | Temp 98.5°F | Resp 18 | Ht 70.5 in | Wt 130.7 lb

## 2016-11-21 DIAGNOSIS — Z79899 Other long term (current) drug therapy: Secondary | ICD-10-CM | POA: Diagnosis not present

## 2016-11-21 DIAGNOSIS — F17210 Nicotine dependence, cigarettes, uncomplicated: Secondary | ICD-10-CM | POA: Diagnosis not present

## 2016-11-21 DIAGNOSIS — E611 Iron deficiency: Secondary | ICD-10-CM | POA: Diagnosis not present

## 2016-11-21 DIAGNOSIS — Z510 Encounter for antineoplastic radiation therapy: Secondary | ICD-10-CM | POA: Diagnosis not present

## 2016-11-21 DIAGNOSIS — H5462 Unqualified visual loss, left eye, normal vision right eye: Secondary | ICD-10-CM | POA: Diagnosis not present

## 2016-11-21 DIAGNOSIS — I10 Essential (primary) hypertension: Secondary | ICD-10-CM | POA: Diagnosis not present

## 2016-11-21 DIAGNOSIS — R197 Diarrhea, unspecified: Secondary | ICD-10-CM

## 2016-11-21 DIAGNOSIS — R634 Abnormal weight loss: Secondary | ICD-10-CM

## 2016-11-21 DIAGNOSIS — Z86718 Personal history of other venous thrombosis and embolism: Secondary | ICD-10-CM | POA: Diagnosis not present

## 2016-11-21 DIAGNOSIS — C20 Malignant neoplasm of rectum: Secondary | ICD-10-CM

## 2016-11-21 DIAGNOSIS — G893 Neoplasm related pain (acute) (chronic): Secondary | ICD-10-CM

## 2016-11-21 DIAGNOSIS — Z7982 Long term (current) use of aspirin: Secondary | ICD-10-CM | POA: Diagnosis not present

## 2016-11-21 LAB — COMPREHENSIVE METABOLIC PANEL
ALBUMIN: 3.8 g/dL (ref 3.5–5.0)
ALT: 14 U/L (ref 0–55)
AST: 18 U/L (ref 5–34)
Alkaline Phosphatase: 75 U/L (ref 40–150)
Anion Gap: 8 mEq/L (ref 3–11)
BUN: 15.5 mg/dL (ref 7.0–26.0)
CALCIUM: 9.8 mg/dL (ref 8.4–10.4)
CHLORIDE: 103 meq/L (ref 98–109)
CO2: 26 meq/L (ref 22–29)
CREATININE: 1.1 mg/dL (ref 0.7–1.3)
EGFR: 89 mL/min/{1.73_m2} — ABNORMAL LOW (ref >90)
GLUCOSE: 90 mg/dL (ref 70–140)
Potassium: 4 mEq/L (ref 3.5–5.1)
Sodium: 137 mEq/L (ref 136–145)
TOTAL PROTEIN: 7.1 g/dL (ref 6.4–8.3)
Total Bilirubin: 0.41 mg/dL (ref 0.20–1.20)

## 2016-11-21 LAB — CBC WITH DIFFERENTIAL/PLATELET
BASO%: 0.2 % (ref 0.0–2.0)
Basophils Absolute: 0 10*3/uL (ref 0.0–0.1)
EOS ABS: 0.1 10*3/uL (ref 0.0–0.5)
EOS%: 2.1 % (ref 0.0–7.0)
HEMATOCRIT: 36 % — AB (ref 38.4–49.9)
HEMOGLOBIN: 12.1 g/dL — AB (ref 13.0–17.1)
LYMPH%: 10.6 % — ABNORMAL LOW (ref 14.0–49.0)
MCH: 30.1 pg (ref 27.2–33.4)
MCHC: 33.6 g/dL (ref 32.0–36.0)
MCV: 89.6 fL (ref 79.3–98.0)
MONO#: 0.5 10*3/uL (ref 0.1–0.9)
MONO%: 7.4 % (ref 0.0–14.0)
NEUT%: 79.7 % — ABNORMAL HIGH (ref 39.0–75.0)
NEUTROS ABS: 4.8 10*3/uL (ref 1.5–6.5)
PLATELETS: 220 10*3/uL (ref 140–400)
RBC: 4.02 10*6/uL — ABNORMAL LOW (ref 4.20–5.82)
RDW: 16.5 % — AB (ref 11.0–14.6)
WBC: 6.1 10*3/uL (ref 4.0–10.3)
lymph#: 0.6 10*3/uL — ABNORMAL LOW (ref 0.9–3.3)

## 2016-11-21 NOTE — Telephone Encounter (Signed)
Gave and calendar for October

## 2016-11-22 ENCOUNTER — Ambulatory Visit
Admission: RE | Admit: 2016-11-22 | Discharge: 2016-11-22 | Disposition: A | Discharge status: Home / Self Care | Payer: 59 | Source: Ambulatory Visit | Attending: Radiation Oncology | Admitting: Radiation Oncology

## 2016-11-22 ENCOUNTER — Encounter: Payer: Self-pay | Admitting: *Deleted

## 2016-11-22 DIAGNOSIS — H5462 Unqualified visual loss, left eye, normal vision right eye: Secondary | ICD-10-CM | POA: Diagnosis not present

## 2016-11-22 DIAGNOSIS — F17210 Nicotine dependence, cigarettes, uncomplicated: Secondary | ICD-10-CM | POA: Diagnosis not present

## 2016-11-22 DIAGNOSIS — E611 Iron deficiency: Secondary | ICD-10-CM | POA: Diagnosis not present

## 2016-11-22 DIAGNOSIS — C20 Malignant neoplasm of rectum: Secondary | ICD-10-CM | POA: Diagnosis not present

## 2016-11-22 DIAGNOSIS — Z510 Encounter for antineoplastic radiation therapy: Secondary | ICD-10-CM | POA: Diagnosis not present

## 2016-11-22 DIAGNOSIS — I10 Essential (primary) hypertension: Secondary | ICD-10-CM | POA: Diagnosis not present

## 2016-11-22 DIAGNOSIS — Z7982 Long term (current) use of aspirin: Secondary | ICD-10-CM | POA: Diagnosis not present

## 2016-11-22 DIAGNOSIS — Z86718 Personal history of other venous thrombosis and embolism: Secondary | ICD-10-CM | POA: Diagnosis not present

## 2016-11-22 DIAGNOSIS — Z79899 Other long term (current) drug therapy: Secondary | ICD-10-CM | POA: Diagnosis not present

## 2016-11-22 NOTE — Progress Notes (Signed)
On 11-22-16 got fmla from the pt matrix, gave to nursing on 11-22-16

## 2016-11-25 ENCOUNTER — Encounter: Payer: Self-pay | Admitting: Radiation Oncology

## 2016-11-25 NOTE — Progress Notes (Signed)
Erica faxed to [employer] and records. Copy is scanned into records and original is left for patient to p/u.

## 2016-11-30 NOTE — Progress Notes (Signed)
°  Radiation Oncology         [phone] ________________________________  Name: Juan David MRN: [id number]  Date: 11/22/2016  DOB: 1955-07-05  End of Treatment Note  Diagnosis:   Stage II, pT2 cT3cN0M0 adenocarcinoma of the distal rectum/proximal anus.       ICD-10-CM   1. Rectal adenocarcinoma ([place]) C20     Indication for treatment:  curative       Radiation treatment dates:   10/10/2016 to 11/22/2016  Site/dose:    1. The rectum was treated to 45 Gy in 25 fractions at 1.8 Gy per fraction. 2. The rectum was boosted to 9 Gy in 5 fractions at 1.8 Gy per fraction.   Beams/energy:    1. 3D // 15X, 6X 2. Photon boost // 15X, 6X  Narrative: The patient tolerated radiation treatment relatively well.   He reports rectal pain and is not taking pain medication because he does not want to become constipated. States that wen he is constipated and has a bowel movement he sees blood when he wipes. Reports some dysuria and feels he is not completely emptying his bladder. States appetite is not good and has been drinking Boost.   Plan: The patient has completed radiation treatment. The patient will return to radiation oncology clinic for routine followup in one month. I advised them to call or return sooner if they have any questions or concerns related to their recovery or treatment.  ------------------------------------------------  Jodelle Gross, MD, PhD  This document serves as a record of services personally performed by Kyung Rudd, MD. It was created on his behalf by Arlyce Harman, a trained medical scribe. The creation of this record is based on the scribe's personal observations and the provider's statements to them. This document has been checked and approved by the attending provider.

## 2016-12-02 DIAGNOSIS — H33052 Total retinal detachment, left eye: Secondary | ICD-10-CM | POA: Diagnosis not present

## 2016-12-02 DIAGNOSIS — S0532XS Ocular laceration without prolapse or loss of intraocular tissue, left eye, sequela: Secondary | ICD-10-CM | POA: Diagnosis not present

## 2016-12-02 DIAGNOSIS — H18422 Band keratopathy, left eye: Secondary | ICD-10-CM | POA: Diagnosis not present

## 2016-12-02 DIAGNOSIS — H4032X4 Glaucoma secondary to eye trauma, left eye, indeterminate stage: Secondary | ICD-10-CM | POA: Diagnosis not present

## 2016-12-26 ENCOUNTER — Other Ambulatory Visit: Payer: Self-pay | Admitting: Nurse Practitioner

## 2016-12-26 ENCOUNTER — Inpatient Hospital Stay: Admission: RE | Admit: 2016-12-26 | Payer: Self-pay | Source: Ambulatory Visit | Admitting: Radiation Oncology

## 2016-12-27 ENCOUNTER — Telehealth: Payer: Self-pay | Admitting: Hematology

## 2016-12-27 ENCOUNTER — Ambulatory Visit (HOSPITAL_BASED_OUTPATIENT_CLINIC_OR_DEPARTMENT_OTHER): Payer: 59 | Admitting: Hematology

## 2016-12-27 ENCOUNTER — Other Ambulatory Visit: Payer: Self-pay | Admitting: Nurse Practitioner

## 2016-12-27 ENCOUNTER — Other Ambulatory Visit (HOSPITAL_BASED_OUTPATIENT_CLINIC_OR_DEPARTMENT_OTHER): Payer: 59

## 2016-12-27 ENCOUNTER — Encounter: Payer: Self-pay | Admitting: Hematology

## 2016-12-27 VITALS — BP 135/83 | HR 89 | Temp 98.7°F | Resp 20 | Ht 70.5 in | Wt 132.1 lb

## 2016-12-27 DIAGNOSIS — C20 Malignant neoplasm of rectum: Secondary | ICD-10-CM | POA: Diagnosis not present

## 2016-12-27 DIAGNOSIS — R197 Diarrhea, unspecified: Secondary | ICD-10-CM

## 2016-12-27 DIAGNOSIS — R350 Frequency of micturition: Secondary | ICD-10-CM | POA: Diagnosis not present

## 2016-12-27 DIAGNOSIS — R634 Abnormal weight loss: Secondary | ICD-10-CM | POA: Diagnosis not present

## 2016-12-27 LAB — CBC WITH DIFFERENTIAL/PLATELET
BASO%: 0.7 % (ref 0.0–2.0)
BASOS ABS: 0 10*3/uL (ref 0.0–0.1)
EOS ABS: 0.1 10*3/uL (ref 0.0–0.5)
EOS%: 2.4 % (ref 0.0–7.0)
HCT: 38 % — ABNORMAL LOW (ref 38.4–49.9)
HEMOGLOBIN: 12.6 g/dL — AB (ref 13.0–17.1)
LYMPH#: 0.6 10*3/uL — AB (ref 0.9–3.3)
LYMPH%: 9.3 % — ABNORMAL LOW (ref 14.0–49.0)
MCH: 30.6 pg (ref 27.2–33.4)
MCHC: 33.2 g/dL (ref 32.0–36.0)
MCV: 92 fL (ref 79.3–98.0)
MONO#: 0.5 10*3/uL (ref 0.1–0.9)
MONO%: 8.9 % (ref 0.0–14.0)
NEUT#: 4.8 10*3/uL (ref 1.5–6.5)
NEUT%: 78.7 % — AB (ref 39.0–75.0)
PLATELETS: 268 10*3/uL (ref 140–400)
RBC: 4.13 10*6/uL — ABNORMAL LOW (ref 4.20–5.82)
RDW: 16.5 % — AB (ref 11.0–14.6)
WBC: 6.1 10*3/uL (ref 4.0–10.3)

## 2016-12-27 LAB — COMPREHENSIVE METABOLIC PANEL
ALBUMIN: 4.1 g/dL (ref 3.5–5.0)
ALT: 18 U/L (ref 0–55)
ANION GAP: 10 meq/L (ref 3–11)
AST: 20 U/L (ref 5–34)
Alkaline Phosphatase: 80 U/L (ref 40–150)
BILIRUBIN TOTAL: 0.45 mg/dL (ref 0.20–1.20)
BUN: 15.2 mg/dL (ref 7.0–26.0)
CALCIUM: 9.7 mg/dL (ref 8.4–10.4)
CO2: 25 mEq/L (ref 22–29)
CREATININE: 1.1 mg/dL (ref 0.7–1.3)
Chloride: 104 mEq/L (ref 98–109)
Glucose: 112 mg/dl (ref 70–140)
POTASSIUM: 4.2 meq/L (ref 3.5–5.1)
Sodium: 139 mEq/L (ref 136–145)
Total Protein: 7.6 g/dL (ref 6.4–8.3)

## 2016-12-27 NOTE — Telephone Encounter (Signed)
Scheduled appt per 10/23 los - Gave patient AVS and calender per los.  

## 2016-12-27 NOTE — Progress Notes (Signed)
[place]  [phone] [phone]  Clinic Follow up Note   Patient Care Team: Leonard Downing, MD as PCP - General (Family Medicine) Leighton Ruff, MD as Consulting Physician (General Surgery) Kyung Rudd, MD as Consulting Physician (Radiation Oncology) Truitt Merle, MD as Consulting Physician (Hematology) 12/27/2016  Chief Complaint: Follow up rectal cancer  SUMMARY OF ONCOLOGIC HISTORY:   Rectal adenocarcinoma ([place])   09/01/2016 Initial Diagnosis    Rectal adenocarcinoma ([place])      09/01/2016 Surgery    Hemorrhoidectomy 09/01/2016. Noted to have a mobile right lateral anal canal mass. Dr. Leighton Ruff.       09/01/2016 Pathology Results    Invasive adenocarcinoma, well-differentiated, spanning 1.3 cm. The tumor invaded into the anal sphincter muscle. Resection margins were negative. The pathologist notes the tumor would be best staged as pT2 given involvement of muscle.       09/06/2016 Imaging    CT scans chest/abdomen/pelvis 09/06/2016 showed no evidence of metastatic disease.       09/22/2016 Imaging    Pelvic MRI 09/22/2016 showed no definite residual anal tumor. There was no evidence of tumor involvement of the external sphincter or adjacent organs. There was no pelvic lymphadenopathy.      10/10/2016 - 11/22/2016 Radiation Therapy    Patient begun adjuvant radiation with Dr. Lisbeth Renshaw      10/10/2016 - 11/22/2016 Chemotherapy    xeloda 1500mg , twice daily.      HPI (Hisory of Present Illness): Juan David is a 61 y.o. male here because of a recently diagnosed rectal cancer. She initially saw Ned Card. Per her notes, patient had a 6 month history of an anal mass that progressed in size over time. He went to the ED of 07/26/2016 with complaints of rectal bleeding. He underwent a hemorrhoidectomy on 08/26/2016 and was noted to have a mobile right lateral anal canal mass that invaded the anal sphincter muscle. The pathology showed  an invasive adenocarcinoma, well-differentiated, spanning 1.3cm. The resection margins were negative. Pathologist notes that tumor would be best staged at pT2 given muscle involvement. On 09/06/2016, CT CAP showed no evidence of metastatic disease. Pelvic MRI on 11/23/2016 showed no definite anal tumor.  He has seen Dr. Lisbeth Renshaw who reccomends adjuvant chemoradiation with plans to begin 10/10/2016.  CURRENT THERAPY: Surveillance: Completed concurrent radiation and chemotherapy with Xeloda 1500 mg twice daily 10/10/2016 -11/22/2016  INTERVAL HISTORY: He presents to the clinic today as scheduled. He completed adjuvant chemoradiation with Xeloda 1500 mg every 12 twice a day on 11/22/2016. He is recovering well but slowly, he denies rectal pain or bleeding. He has skin dryness and darkening to the radiation field, small area of breakdown at upper gluteal cleft. He uses topical Neosporin, Aquaphor, cortisone, lotions, and lidocaine with improvement. Bowels moving normally. He notes urinary urgency and some dribbling, present during radiation, improving slowly. He denies dysuria or hematuria.   REVIEW OF SYSTEMS:   Constitutional: Denies fatigue, fevers, chills (+) 2 pounds weight gain (+) good appetite Eyes: Denies blurriness of vision Ears, nose, mouth, throat, and face: Denies mucositis or sore throat Respiratory: Denies cough, dyspnea or wheezes Cardiovascular: Denies palpitation, chest discomfort or lower extremity swelling Gastrointestinal:  Denies nausea, vomiting, constipation, diarrhea, heartburn, rectal pain, or bleeding GU: denies dysuria, hematuria, or incontinence (+) urinary urgency (+) frequency (+) dribbling, unchanged since radiation  Skin: Denies abnormal skin rashes (+) skin dryness and darkening to radiation treatment field including groin, buttock, and perineum Lymphatics: Denies  new lymphadenopathy or easy bruising Neurological:Denies numbness, tingling or new weaknesses Behavioral/Psych:  Mood is stable, no new changes  All other systems were reviewed with the patient and are negative.  MEDICAL HISTORY:  Past Medical History:  Diagnosis Date  . Arthritis   . Chronic anemia   . History of cardiovascular stress test    per pt in 1980's , told was normal  . History of DVT of lower extremity    post right knee surgery 1998  lower extremitiy  treated w/ coumadin for a year/  per pt no dvt since  . 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 Hypertension   . Iron deficiency   . Legally blind in left eye, as defined in [place]    per pt only see light  . PFO (patent foramen ovale)    per TEE done 05-11-2009   . Traumatic glaucoma, 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 at [facility] in [place]   08-18-2001  . Wears glasses     SURGICAL HISTORY: Past Surgical History:  Procedure Laterality Date  . ARTHROSCOPIC REPAIR ACL Right 1998  . EXPLORATION AND REPAIR LEFT EYE INJURY  08-18-2001   [place]   ruptured globe- repair corneal laceration, reposition of prolapsed uveal tissue  . HEMORRHOID SURGERY N/A 09/01/2016   Procedure: HEMORRHOIDECTOMY;  Surgeon: Leighton Ruff, MD;  Location: [facility];  Service: General;  Laterality: N/A;  . SUPERFICIAL KERATECTOMY Left 06-29-2011    [facility]    with EDTA scrub of left eye  . TEE WITHOUT CARDIOVERSION  05-11-2009  dr Dorris Carnes   LVSEF 55-65%/  mild thickened AV without AI/  trace MR/ mixed fixed artherosclerosis plaqueing thoracic aorta/  no evidence thrombus/  by agitated saling and color doppler there was a PFO    I have reviewed the social history and family history with the patient and they are unchanged from previous note.  ALLERGIES:  has No Known Allergies.  MEDICATIONS:  Current Outpatient Prescriptions  Medication Sig Dispense Refill  . aspirin 325 MG tablet Take 325 mg by mouth daily.    . Ferrous Sulfate (IRON) 325 (65 Fe) MG  TABS Take 5 tablets by mouth every morning.    . hydrochlorothiazide (HYDRODIURIL) 50 MG tablet Take 25 mg by mouth every morning.   0  . Multiple Vitamin (MULTI-VITAMINS) TABS Take 1 tablet by mouth daily. Adult chewable    . Potassium 95 MG TABS Take 1 tablet by mouth every morning.    . timolol (TIMOPTIC) 0.5 % ophthalmic solution Place 1 drop into the left eye every morning.   11  . capecitabine (XELODA) 500 MG tablet Take 3 tablets (1,500 mg total) by mouth 2 (two) times daily after a meal. Take on days of radiation only 168 tablet 0  . docusate sodium (COLACE) 100 MG capsule Take 100 mg by mouth 2 (two) times daily as needed for mild constipation.    . hypromellose (GENTEAL) 0.3 % GEL ophthalmic ointment     . oxyCODONE (OXY IR/ROXICODONE) 5 MG immediate release tablet Take 1-2 tablets (5-10 mg total) by mouth every 6 (six) hours as needed for severe pain. (Patient not taking: Reported on 11/21/2016) 20 tablet 0  . prednisoLONE acetate (PRED FORTE) 1 % ophthalmic suspension Place 1 drop into the left eye as needed.    . prochlorperazine (COMPAZINE) 10 MG tablet Take 1 tablet (10  mg total) by mouth every 6 (six) hours as needed for nausea or vomiting. (Patient not taking: Reported on 11/21/2016) 30 tablet 0   No current facility-administered medications for this visit.     PHYSICAL EXAMINATION:  ECOG PERFORMANCE STATUS: 1 - Symptomatic but completely ambulatory  Vitals:   12/27/16 1312  BP: 135/83  Pulse: 89  Resp: 20  Temp: 98.7 F (37.1 C)  SpO2: 100%   Filed Weights   12/27/16 1312  Weight: 132 lb 1.6 oz (59.9 kg)    GENERAL:alert, no distress and comfortable SKIN: skin color, texture, turgor are normal, no rashes or significant lesions EYES: normal, Conjunctiva are pink and non-injected, sclera clear OROPHARYNX:no exudate, no erythema and lips, buccal mucosa, and tongue normal  NECK: supple, thyroid normal size, non-tender, without nodularity LYMPH:  no palpable cervical,  supraclavicular, or inguinal lymphadenopathy  LUNGS: clear to auscultation bilaterally with normal breathing effort HEART: regular rate & rhythm and no murmurs and no lower extremity edema ABDOMEN:abdomen soft, non-tender and normal bowel sounds. No palpable organomegaly or masses. (+) skin darkening to groin, perineum, anus, with 1 cm length ulceration at upper gluteal cleft Musculoskeletal:no cyanosis of digits and no clubbing  NEURO: alert & oriented x 3 with fluent speech, no focal motor/sensory deficits  LABORATORY DATA:  I have reviewed the data as listed CBC Latest Ref Rng & Units 12/27/2016 11/21/2016 11/14/2016  WBC 4.0 - 10.3 10e3/uL 6.1 6.1 5.8  Hemoglobin 13.0 - 17.1 g/dL 12.6(L) 12.1(L) 12.2(L)  Hematocrit 38.4 - 49.9 % 38.0(L) 36.0(L) 37.0(L)  Platelets 140 - 400 10e3/uL 268 220 238     CMP Latest Ref Rng & Units 12/27/2016 11/21/2016 11/14/2016  Glucose 70 - 140 mg/dl 112 90 82  BUN 7.0 - 26.0 mg/dL 15.2 15.5 15.8  Creatinine 0.7 - 1.3 mg/dL 1.1 1.1 0.9  Sodium 136 - 145 mEq/L 139 137 137  Potassium 3.5 - 5.1 mEq/L 4.2 4.0 4.1  Chloride 101 - 111 mmol/L - - -  CO2 22 - 29 mEq/L 25 26 26   Calcium 8.4 - 10.4 mg/dL 9.7 9.8 9.6  Total Protein 6.4 - 8.3 g/dL 7.6 7.1 7.3  Total Bilirubin 0.20 - 1.20 mg/dL 0.45 0.41 0.37  Alkaline Phos 40 - 150 U/L 80 75 78  AST 5 - 34 U/L 20 18 17   ALT 0 - 55 U/L 18 14 12    PATHOLOGY REPORT: Diagnosis 09/01/2016 Anus, biopsy, canal mass - INVASIVE ADENOCARCINOMA, WELL DIFFERENTIATED, SPANNING 1.3 CM. - TUMOR INVADES INTO ANAL SPHINCTER MUSCLE. - RESECTION MARGINS ARE NEGATIVE. Microscopic Comment Dr. Melina Copa has reviewed the case. Dr. Marcello Moores was paged on 09/02/2016. ADDITIONAL INFORMATION: The tumor would best be staged as a pT2 given involvement of muscle.   RADIOGRAPHIC STUDIES: MR Pelvis w wo Contrast 10/01/2016 IMPRESSION: No definite residual anal tumor identified. No evidence of tumor involvement of the external sphincter or  adjacent organs. No pelvic Lymphadenopathy.  CT Chest, Abdomen, Pelvic w Contrast 09/06/2016 IMPRESSION: 1. No evidence to suggest metastatic disease in the chest, abdomen or pelvis. 2. Aortic atherosclerosis, in addition to 2 vessel coronary artery disease. Please note that although the presence of coronary artery calcium documents the presence of coronary artery disease, the severity of this disease and any potential stenosis cannot be assessed on this non-gated CT examination. Assessment for potential risk factor modification, dietary therapy or pharmacologic therapy may be warranted, if clinically indicated. 3. Additional incidental findings, as above.  I have personally reviewed the radiological images as listed  and agreed with the findings in the report. No results found.   ASSESSMENT & PLAN: 61 y.o.-year-old African-American male, with past medical history of hypertension, iron deficiency, glaucoma, remote history of DVT, presented with rectal mass and rectal bleeding.  1.Adenocarcinoma of the distal rectum, pT2cN0M0, stage I -He is status post right lateral anal/low rectum mass resection by Dr. Marcello Moores. His surgical path reviewed a T2 tumor, invasive adenocarcinoma. Pelvic MRI showed no residual tumor and no adenopathy. This was reviewed with patient previously. -patient declined standard surgery APR  -Case was discussed in the tumor board, we recommend him to undergo concurrent chemoradiation, to reduce his risk of local recurrence. concurrent Xeloda 8 25 mg/m twice daily with radiation was recommended. Potential side effects discussed with patient, he agreed to proceed. -he completed concurrent chemoradiation with Xeloda 1500 mg twice a day on 11/22/2016 per Dr. Lisbeth Renshaw; he tolerated well overall but had significant skin toxicity to perineum -skin appears to be healing well, continue topical Neosporin, Aquaphor, cortisone cream for itching, lotion and topical lidocaine for pain when  necessary -Lab reviewed, slightly anemic but stable hemoglobin at 12.6; Cmet WNL -We discussed rectal cancer surveillance after he completes chemoradiation; he will see Dr. Lisbeth Renshaw and Dr. Marcello Moores next week; we discussed staggering provider visits in the future; scan in 1-2 years or sooner if clinical picture warrants -he will return for lab and physical exam in 3 months; consider f/u with rad onc and surgery 3 months after that  2. Weight loss and diarrhea -5 pounds weight loss during chemoradiation, he has gained 2 pounds since completing therapy -Diarrhea now resolved off treatment -Previously encouraged to drink one additional ensure daily, he may use this now when necessary  3. 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 Lisbeth Renshaw and Dr. Marcello Moores next week -lab and f/u in 3 months  All questions were answered. The patient knows to call the clinic with any problems, questions or concerns. No barriers to learning was detected. I spent 15 minutes counseling the patient face to face. The total time spent in the appointment was 20 minutes and more than 50% was on counseling and review of test results  This document serves as a record of services personally performed by Truitt Merle, MD. It was created on her behalf by Reola Mosher, a trained medical scribe. The creation of this record is based on the scribe's personal observations and the provider's statements to them. This document has been checked and approved by the attending provider.   Alla Feeling  12/27/2016

## 2017-01-03 ENCOUNTER — Ambulatory Visit
Admission: RE | Admit: 2017-01-03 | Discharge: 2017-01-03 | Disposition: A | Discharge status: Home / Self Care | Payer: 59 | Source: Ambulatory Visit | Attending: Radiation Oncology | Admitting: Radiation Oncology

## 2017-01-03 ENCOUNTER — Encounter: Payer: Self-pay | Admitting: Radiation Oncology

## 2017-01-03 VITALS — BP 132/94 | HR 83 | Temp 98.0°F | Resp 18 | Ht 70.5 in | Wt 136.6 lb

## 2017-01-03 DIAGNOSIS — R3915 Urgency of urination: Secondary | ICD-10-CM | POA: Diagnosis not present

## 2017-01-03 DIAGNOSIS — Z923 Personal history of irradiation: Secondary | ICD-10-CM | POA: Diagnosis not present

## 2017-01-03 DIAGNOSIS — Z9221 Personal history of antineoplastic chemotherapy: Secondary | ICD-10-CM | POA: Diagnosis not present

## 2017-01-03 DIAGNOSIS — Z7982 Long term (current) use of aspirin: Secondary | ICD-10-CM | POA: Diagnosis not present

## 2017-01-03 DIAGNOSIS — Z79899 Other long term (current) drug therapy: Secondary | ICD-10-CM | POA: Insufficient documentation

## 2017-01-03 DIAGNOSIS — C20 Malignant neoplasm of rectum: Secondary | ICD-10-CM | POA: Insufficient documentation

## 2017-01-03 NOTE — Progress Notes (Addendum)
Radiation Oncology         [phone] ________________________________  Name: Juan David MRN: [id number]  Date of Service: 01/03/2017 DOB: 1956-01-15  Post Treatment Note  CC: Leonard Downing, MD  Leighton Ruff, MD  Diagnosis:   Stage I, pT2cN0M0 adenocarcinoma of the distal rectum  Interval Since Last Radiation:  6 weeks   10/10/2016 to 11/22/2016: 1. The rectum was treated to 45 Gy in 25 fractions at 1.8 Gy per fraction. 2. The rectum was boosted to 9 Gy in 5 fractions at 1.8 Gy per fraction.   Narrative:  The patient returns today for routine follow-up. In summary this is the patient underwent hemorrhoidectomy in June 2018,  Postoperatively did not have any residual disease, he was discussed in multidisciplinary GI conference and as he declined APR, he was offered adjuvant chemotherapy RT. He completed this about 6 weeks ago. He has plans follow up with Dr. Burr Medico in approximately 3 months, as well as with Dr. Marcello Moores as well.  On review of systems, the patient states he's doing pretty well. He has an area of separation along the gluteal cleft and has been using topical aquaphor and neosporin. He denies any bowel dysfunction. He continues to have mild urinary urgency that he also states is improving slowly. He denies any blood in his urine, flank pain, or fevers. He denies dysuria. No other complaints are noted.  ALLERGIES:  has No Known Allergies.  Meds: Current Outpatient Prescriptions  Medication Sig Dispense Refill  . aspirin 325 MG tablet Take 325 mg by mouth daily.    . Ferrous Sulfate (IRON) 325 (65 Fe) MG TABS Take 5 tablets by mouth every morning.    . hydrochlorothiazide (HYDRODIURIL) 50 MG tablet Take 25 mg by mouth every morning.   0  . hypromellose (GENTEAL) 0.3 % GEL ophthalmic ointment     . Multiple Vitamin (MULTI-VITAMINS) TABS Take 1 tablet by mouth daily. Adult chewable    . Potassium 95 MG TABS Take 1 tablet by mouth every morning.    . timolol  (TIMOPTIC) 0.5 % ophthalmic solution Place 1 drop into the left eye every morning.   11  . oxyCODONE (OXY IR/ROXICODONE) 5 MG immediate release tablet Take 1-2 tablets (5-10 mg total) by mouth every 6 (six) hours as needed for severe pain. (Patient not taking: Reported on 11/21/2016) 20 tablet 0  . prednisoLONE acetate (PRED FORTE) 1 % ophthalmic suspension Place 1 drop into the left eye as needed.    . prochlorperazine (COMPAZINE) 10 MG tablet Take 1 tablet (10 mg total) by mouth every 6 (six) hours as needed for nausea or vomiting. (Patient not taking: Reported on 11/21/2016) 30 tablet 0   No current facility-administered medications for this encounter.     Physical Findings:  height is 5' 10.5" (1.791 m) and weight is 136 lb 9.6 oz (62 kg). His oral temperature is 98 F (36.7 C). His blood pressure is 132/94 (abnormal) and his pulse is 83. His respiration is 18 and oxygen saturation is 100%.  Pain Assessment Pain Score: 2  (coccyx)/10 In general this is a well appearing African-American male in no acute distress. He's alert and oriented x4 and appropriate throughout the examination. Cardiopulmonary assessment is negative for acute distress and he exhibits normal effort. The anus is assessed and is intact without evidence of gross lesion. Hyperpigmentation and mild desquamation is still noted in the gluteal cleft with minimal separation of the skin along the midline. No evidence of  cellulitic streaking or purulent matter is appreciated.  Lab Findings: Lab Results  Component Value Date   WBC 6.1 12/27/2016   HGB 12.6 (L) 12/27/2016   HCT 38.0 (L) 12/27/2016   MCV 92.0 12/27/2016   PLT 268 12/27/2016     Radiographic Findings: No results found.  Impression/Plan: 1. Stage I, pT2cN0M0 adenocarcinoma of the distal rectum. Marland Kitchen He will be followed closely in surveillance with medical oncology as well as with Dr. Marcello Moores. We will see him as needed moving forward and would be happy to see him in  the future if he has questions or concerns regarding previous radiotherapy. Regarding his skin, he is encouraged to use Aquaphor and Neosporin as he is doing currently, we also discussed the possibility of topical nitroglycerin if this is not healed with 4 weeks. He states agreement and understanding we'll call us if this is the case.     Carola Rhine, PAC

## 2017-01-03 NOTE — Addendum Note (Signed)
Encounter addended by: Malena Edman, RN on: 01/03/2017 10:28 AM<BR>    Actions taken: Charge Capture section accepted

## 2017-01-27 MED FILL — HYDROCHLOROTHIAZIDE 50 MG T: 50 | 90 days supply | Qty: 45 | Fill #1

## 2017-01-27 MED FILL — TIMOLOL 0.5% EYE DROPS: 0.5 | 90 days supply | Qty: 5 | Fill #2

## 2017-03-24 NOTE — Progress Notes (Signed)
[place]  [phone] [phone]) [phone]  Clinic Follow up Note   Patient Care Team: Leonard Downing, MD as PCP - General (Family Medicine) Leighton Ruff, MD as Consulting Physician (General Surgery) Kyung Rudd, MD as Consulting Physician (Radiation Oncology) Truitt Merle, MD as Consulting Physician (Hematology) 03/27/2017  Chief Complaint: Follow up rectal cancer  SUMMARY OF ONCOLOGIC HISTORY: Oncology History   Cancer Staging Rectal adenocarcinoma [facility]) Staging form: Colon and Rectum, AJCC 8th Edition - Pathologic stage from 09/01/2016: Stage I (pT2, pN0, cM0) - Signed by Truitt Merle, MD on 10/01/2016       Rectal adenocarcinoma ([place])   09/01/2016 Initial Diagnosis    Rectal adenocarcinoma ([place])      09/01/2016 Surgery    Hemorrhoidectomy 09/01/2016. Noted to have a mobile right lateral anal canal mass. Dr. Leighton Ruff.       09/01/2016 Pathology Results    Invasive adenocarcinoma, well-differentiated, spanning 1.3 cm. The tumor invaded into the anal sphincter muscle. Resection margins were negative. The pathologist notes the tumor would be best staged as pT2 given involvement of muscle.       09/06/2016 Imaging    CT scans chest/abdomen/pelvis 09/06/2016 showed no evidence of metastatic disease.       09/22/2016 Imaging    Pelvic MRI 09/22/2016 showed no definite residual anal tumor. There was no evidence of tumor involvement of the external sphincter or adjacent organs. There was no pelvic lymphadenopathy.      10/10/2016 - 11/22/2016 Radiation Therapy    Patient begun adjuvant radiation with Dr. Lisbeth Renshaw      10/10/2016 - 11/22/2016 Chemotherapy    xeloda 1500mg , twice daily.      HPI (Hisory of Present Illness): Juan David is a 62 y.o. male here because of a recently diagnosed rectal cancer. She initially saw Ned Card. Per her notes, patient had a 6 month history of an anal mass that progressed in size over time. He went to the  ED of 07/26/2016 with complaints of rectal bleeding. He underwent a hemorrhoidectomy on 08/26/2016 and was noted to have a mobile right lateral anal canal mass that invaded the anal sphincter muscle. The pathology showed an invasive adenocarcinoma, well-differentiated, spanning 1.3cm. The resection margins were negative. Pathologist notes that tumor would be best staged at pT2 given muscle involvement. On 09/06/2016, CT CAP showed no evidence of metastatic disease. Pelvic MRI on 11/23/2016 showed no definite anal tumor.  He has seen Dr. Lisbeth Renshaw who reccomends adjuvant chemoradiation with plans to begin 10/10/2016.  CURRENT THERAPY: Surveillance  INTERVAL HISTORY: Juan David presents to the clinic today for follow up. He is doing well overall and he is feeling much better after completing chemo and radiation. He is not 100% back to normal. He reports some urgency when using the bathroom but with no pain. He has some genitourinary dryness. He will see Dr.Thomas next week. He reports no issues with bowel movements. He is back to work full time.   On review of systems, pt denies chest pain, melena, blood in stool, or any other complaints at this time. Pt denies abdominal pain, nausea, vomiting. Pertinent positives are listed and detailed within the above HPI.    REVIEW OF SYSTEMS:   Constitutional: Denies fevers, chills  Eyes: Denies blurriness of vision Ears, nose, mouth, throat, and face: Denies mucositis or sore throat Respiratory: Denies cough, dyspnea or wheezes Cardiovascular: Denies palpitation, chest discomfort or lower extremity swelling Gastrointestinal:  Denies nausea, heartburn Skin: Denies  abnormal skin rashes Lymphatics: Denies new lymphadenopathy or easy bruising Neurological:Denies numbness, tingling or new weaknesses Behavioral/Psych: Mood is stable, no new changes  All other systems were reviewed with the patient and are negative.  MEDICAL HISTORY:  Past Medical History:  Diagnosis  Date  . Arthritis   . Chronic anemia   . History of cardiovascular stress test    per pt in 1980's , told was normal  . History of DVT of lower extremity    post right knee surgery 1998  lower extremitiy  treated w/ coumadin for a year/  per pt no dvt since  . 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 Hypertension   . Iron deficiency   . Legally blind in left eye, as defined in [place]    per pt only see light  . PFO (patent foramen ovale)    per TEE done 05-11-2009   . 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 Wears glasses     SURGICAL HISTORY: Past Surgical History:  Procedure Laterality Date  . ARTHROSCOPIC REPAIR ACL Right 1998  . EXPLORATION AND REPAIR LEFT EYE INJURY  08-18-2001   [place]   ruptured globe- repair corneal laceration, reposition of prolapsed uveal tissue  . HEMORRHOID SURGERY N/A 09/01/2016   Procedure: HEMORRHOIDECTOMY;  Surgeon: Leighton Ruff, MD;  Location: [facility];  Service: General;  Laterality: N/A;  . SUPERFICIAL KERATECTOMY Left 06-29-2011    [facility]    with EDTA scrub of left eye  . TEE WITHOUT CARDIOVERSION  05-11-2009  dr Dorris Carnes   LVSEF 55-65%/  mild thickened AV without AI/  trace MR/ mixed fixed artherosclerosis plaqueing thoracic aorta/  no evidence thrombus/  by agitated saling and color doppler there was a PFO    I have reviewed the social history and family history with the patient and they are unchanged from previous note.  ALLERGIES:  has No Known Allergies.  MEDICATIONS:  Current Outpatient Medications  Medication Sig Dispense Refill  . aspirin 325 MG tablet Take 325 mg by mouth daily.    . Ferrous Sulfate (IRON) 325 (65 Fe) MG TABS Take 4 tablets by mouth every morning.     . hydrochlorothiazide (HYDRODIURIL) 50 MG tablet Take 25 mg by mouth every morning.   0  . hypromellose  (GENTEAL) 0.3 % GEL ophthalmic ointment Place 1 application into the left eye as needed.     . Multiple Vitamin (MULTI-VITAMINS) TABS Take 1 tablet by mouth daily. Adult chewable    . Potassium 95 MG TABS Take 1 tablet by mouth every morning.    . prednisoLONE acetate (PRED FORTE) 1 % ophthalmic suspension Place 1 drop into the left eye as needed.    . timolol (TIMOPTIC) 0.5 % ophthalmic solution Place 1 drop into the left eye every morning.   11  . prochlorperazine (COMPAZINE) 10 MG tablet Take 1 tablet (10 mg total) by mouth every 6 (six) hours as needed for nausea or vomiting. (Patient not taking: Reported on 11/21/2016) 30 tablet 0   No current facility-administered medications for this visit.     PHYSICAL EXAMINATION:  ECOG PERFORMANCE STATUS: 0 - Asymptomatic  Vitals:   03/27/17 1159  BP: 137/85  Pulse: 84  Resp: 18  Temp: 98 F (36.7 C)  SpO2: 98%   Filed Weights   03/27/17  1159  Weight: 138 lb 1.6 oz (62.6 kg)    GENERAL:alert, no distress and comfortable SKIN: skin color, texture, turgor are normal, no rashes or significant lesions EYES: normal, Conjunctiva are pink and non-injected, sclera clear OROPHARYNX:no exudate, no erythema and lips, buccal mucosa, and tongue normal  NECK: supple, thyroid normal size, non-tender, without nodularity LYMPH:  no palpable lymphadenopathy in the cervical, axillary or inguinal LUNGS: clear to auscultation and percussion with normal breathing effort HEART: regular rate & rhythm and no murmurs and no lower extremity edema ABDOMEN:abdomen soft, non-tender and normal bowel sounds Musculoskeletal:no cyanosis of digits and no clubbing  NEURO: alert & oriented x 3 with fluent speech, no focal motor/sensory deficits Rectal exam: normal. There are some internal Hemorids.   LABORATORY DATA:  I have reviewed the data as listed CBC Latest Ref Rng & Units 03/27/2017 12/27/2016 11/21/2016  WBC 4.0 - 10.3 K/uL 7.2 6.1 6.1  Hemoglobin 13.0 - 17.1  g/dL 12.4(L) 12.6(L) 12.1(L)  Hematocrit 38.4 - 49.9 % 37.1(L) 38.0(L) 36.0(L)  Platelets 140 - 400 K/uL 229 268 220     CMP Latest Ref Rng & Units 03/27/2017 12/27/2016 11/21/2016  Glucose 70 - 140 mg/dL 91 112 90  BUN 7 - 26 mg/dL 14 15.2 15.5  Creatinine 0.70 - 1.30 mg/dL 0.97 1.1 1.1  Sodium 136 - 145 mmol/L 138 139 137  Potassium 3.5 - 5.1 mmol/L 4.2 4.2 4.0  Chloride 98 - 109 mmol/L 105 - -  CO2 22 - 29 mmol/L 26 25 26   Calcium 8.4 - 10.4 mg/dL 9.2 9.7 9.8  Total Protein 6.4 - 8.3 g/dL 7.0 7.6 7.1  Total Bilirubin 0.2 - 1.2 mg/dL 0.3 0.45 0.41  Alkaline Phos 40 - 150 U/L 82 80 75  AST 5 - 34 U/L 18 20 18   ALT 0 - 55 U/L 18 18 14    PATHOLOGY REPORT: Diagnosis 09/01/2016 Anus, biopsy, canal mass - INVASIVE ADENOCARCINOMA, WELL DIFFERENTIATED, SPANNING 1.3 CM. - TUMOR INVADES INTO ANAL SPHINCTER MUSCLE. - RESECTION MARGINS ARE NEGATIVE. Microscopic Comment Dr. Melina Copa has reviewed the case. Dr. Marcello Moores was paged on 09/02/2016. ADDITIONAL INFORMATION: The tumor would best be staged as a pT2 given involvement of muscle.   RADIOGRAPHIC STUDIES: MR Pelvis w wo Contrast 11-Oct-2016 IMPRESSION: No definite residual anal tumor identified. No evidence of tumor involvement of the external sphincter or adjacent organs. No pelvic Lymphadenopathy.  CT Chest, Abdomen, Pelvic w Contrast 09/06/2016 IMPRESSION: 1. No evidence to suggest metastatic disease in the chest, abdomen or pelvis. 2. Aortic atherosclerosis, in addition to 2 vessel coronary artery disease. Please note that although the presence of coronary artery calcium documents the presence of coronary artery disease, the severity of this disease and any potential stenosis cannot be assessed on this non-gated CT examination. Assessment for potential risk factor modification, dietary therapy or pharmacologic therapy may be warranted, if clinically indicated. 3. Additional incidental findings, as above.  I have personally  reviewed the radiological images as listed and agreed with the findings in the report. No results found.   ASSESSMENT & PLAN: 62 y.o.-year-old African-American male, with past medical history of hypertension, iron deficiency, glaucoma, remote history of DVT, presented with rectal mass and rectal bleeding.  1.Adenocarcinoma of the distal rectum, pT2cN0M0, stage I -He is status post right lateral anal/low rectum mass resection by Dr. Marcello Moores. His surgical path reviewed a T2 tumor, invasive adenocarcinoma. Pelvic MRI showed no residual tumor and no adenopathy. This was reviewed with patient previously. -Standard surgery APR  was discussed with patient, he declined. -Case was discussed in the tumor board, we recommend him to undergo concurrent chemoradiation, to reduce his risk of local recurrence. I recommend concurrent Xeloda 8 25 mg/m twice daily with radiation. Potential side effects discussed with patient, he agrees to proceed. - patient has completed adjuvant concurrent chemo and radiation, tolerated well overall. -We discussed rectal cancer surveillance after he completes chemoradiation. -He has recovered well, asymptomatic, exam was unremarkable including rectal exam, lab reviewed, no clinical concern for recurrence. -Colonoscopy due summer 2019 at [facility]. Scan before next visit.  -Labs reviewed (03/27/17) and hgb is 12.4. CEA is 2.49.  -F/u 3-4 months after seeing Dr. Marcello Moores and continue to alternate appointments.  -Plan to have surveillance CT scan in a months.  Due to his early stage disease, if the next scan is negative, we may not have to repeat his surveillance CT scans.  2. Weight loss and diarrhea -Weight loss of 5 lbs since 8/6 -Diarrhea managed with Imodium -Drinks one Ensure daily and does not eat lunch when working. Encouraged patient to drink additional Ensure daily. -Weight is 138 today (03/27/17) it is back to normal  PLAN: -F/u in 8 months, he will see Dr. Marcello Moores soon, than  again in 4 months, I will communicate with Dr Marcello Moores  -CT CAP in 8 months before next visit  No problem-specific Assessment & Plan notes found for this encounter.   Orders Placed This Encounter  Procedures  . CT Abdomen Pelvis W Contrast    Standing Status:   Future    Standing Expiration Date:   03/27/2018    Scheduling Instructions:     F/u, rule out metastasis    Order Specific Question:   If indicated for the ordered procedure, I authorize the administration of contrast media per Radiology protocol    Answer:   Yes    Order Specific Question:   Preferred imaging location?    Answer:   [facility]    Order Specific Question:   Radiology Contrast Protocol - do NOT remove file path    Answer:   \\[email or web address]  . CT Chest W Contrast    Standing Status:   Future    Standing Expiration Date:   03/27/2018    Order Specific Question:   If indicated for the ordered procedure, I authorize the administration of contrast media per Radiology protocol    Answer:   Yes    Order Specific Question:   Preferred imaging location?    Answer:   [facility]    Order Specific Question:   Radiology Contrast Protocol - do NOT remove file path    Answer:   \\[email or web address]   All questions were answered. The patient knows to call the clinic with any problems, questions or concerns. No barriers to learning was detected.  I spent 15 minutes counseling the patient face to face. The total time spent in the appointment was 25 minutes and more than 50% was on counseling and review of test results  This document serves as a record of services personally performed by Truitt Merle, MD. It was created on her behalf by Theresia Bough, a trained medical scribe. The creation of this record is based on the scribe's personal observations and the provider's statements to them.   I have reviewed the above documentation for accuracy and completeness,  and I agree with the above.     Truitt Merle  03/27/2017

## 2017-03-27 ENCOUNTER — Inpatient Hospital Stay: Payer: 59 | Attending: Hematology | Admitting: Hematology

## 2017-03-27 ENCOUNTER — Inpatient Hospital Stay: Payer: 59

## 2017-03-27 VITALS — BP 137/85 | HR 84 | Temp 98.0°F | Resp 18 | Ht 70.5 in | Wt 138.1 lb

## 2017-03-27 DIAGNOSIS — R197 Diarrhea, unspecified: Secondary | ICD-10-CM | POA: Insufficient documentation

## 2017-03-27 DIAGNOSIS — R634 Abnormal weight loss: Secondary | ICD-10-CM | POA: Insufficient documentation

## 2017-03-27 DIAGNOSIS — Z79899 Other long term (current) drug therapy: Secondary | ICD-10-CM | POA: Insufficient documentation

## 2017-03-27 DIAGNOSIS — Z923 Personal history of irradiation: Secondary | ICD-10-CM | POA: Insufficient documentation

## 2017-03-27 DIAGNOSIS — I10 Essential (primary) hypertension: Secondary | ICD-10-CM | POA: Diagnosis not present

## 2017-03-27 DIAGNOSIS — Z7982 Long term (current) use of aspirin: Secondary | ICD-10-CM | POA: Diagnosis not present

## 2017-03-27 DIAGNOSIS — Z86718 Personal history of other venous thrombosis and embolism: Secondary | ICD-10-CM | POA: Diagnosis not present

## 2017-03-27 DIAGNOSIS — C20 Malignant neoplasm of rectum: Secondary | ICD-10-CM

## 2017-03-27 DIAGNOSIS — I700 Atherosclerosis of aorta: Secondary | ICD-10-CM | POA: Diagnosis not present

## 2017-03-27 DIAGNOSIS — H409 Unspecified glaucoma: Secondary | ICD-10-CM | POA: Diagnosis not present

## 2017-03-27 DIAGNOSIS — Z9221 Personal history of antineoplastic chemotherapy: Secondary | ICD-10-CM | POA: Insufficient documentation

## 2017-03-27 DIAGNOSIS — I2510 Atherosclerotic heart disease of native coronary artery without angina pectoris: Secondary | ICD-10-CM | POA: Diagnosis not present

## 2017-03-27 LAB — CBC WITH DIFFERENTIAL/PLATELET
BASOS ABS: 0 10*3/uL (ref 0.0–0.1)
Basophils Relative: 0 %
EOS PCT: 2 %
Eosinophils Absolute: 0.2 10*3/uL (ref 0.0–0.5)
HCT: 37.1 % — ABNORMAL LOW (ref 38.4–49.9)
Hemoglobin: 12.4 g/dL — ABNORMAL LOW (ref 13.0–17.1)
LYMPHS PCT: 13 %
Lymphs Abs: 0.9 10*3/uL (ref 0.9–3.3)
MCH: 29.7 pg (ref 27.2–33.4)
MCHC: 33.4 g/dL (ref 32.0–36.0)
MCV: 88.8 fL (ref 79.3–98.0)
MONO ABS: 0.6 10*3/uL (ref 0.1–0.9)
Monocytes Relative: 8 %
Neutro Abs: 5.5 10*3/uL (ref 1.5–6.5)
Neutrophils Relative %: 77 %
PLATELETS: 229 10*3/uL (ref 140–400)
RBC: 4.18 MIL/uL — AB (ref 4.20–5.82)
RDW: 13.9 % (ref 11.0–15.6)
WBC: 7.2 10*3/uL (ref 4.0–10.3)

## 2017-03-27 LAB — COMPREHENSIVE METABOLIC PANEL
ALBUMIN: 3.7 g/dL (ref 3.5–5.0)
ALT: 18 U/L (ref 0–55)
AST: 18 U/L (ref 5–34)
Alkaline Phosphatase: 82 U/L (ref 40–150)
Anion gap: 7 (ref 3–11)
BUN: 14 mg/dL (ref 7–26)
CHLORIDE: 105 mmol/L (ref 98–109)
CO2: 26 mmol/L (ref 22–29)
Calcium: 9.2 mg/dL (ref 8.4–10.4)
Creatinine, Ser: 0.97 mg/dL (ref 0.70–1.30)
GFR calc Af Amer: >60 mL/min (ref >60)
GFR calc non Af Amer: >60 mL/min (ref >60)
GLUCOSE: 91 mg/dL (ref 70–140)
POTASSIUM: 4.2 mmol/L (ref 3.5–5.1)
SODIUM: 138 mmol/L (ref 136–145)
TOTAL PROTEIN: 7 g/dL (ref 6.4–8.3)
Total Bilirubin: 0.3 mg/dL (ref 0.2–1.2)

## 2017-03-27 LAB — CEA (IN HOUSE-CHCC): CEA (CHCC-In House): 2.49 ng/mL (ref 0.00–5.00)

## 2017-03-28 ENCOUNTER — Encounter: Payer: Self-pay | Admitting: Hematology

## 2017-03-29 ENCOUNTER — Telehealth: Payer: Self-pay | Admitting: Hematology

## 2017-03-29 NOTE — Telephone Encounter (Signed)
Appointment scheduled and letter / calendar mailed to patient per 1/21 los

## 2017-04-20 MED FILL — TIMOLOL 0.5% EYE DROPS: 0.5 | 90 days supply | Qty: 5 | Fill #3

## 2017-04-21 DIAGNOSIS — Z85048 Personal history of other malignant neoplasm of rectum, rectosigmoid junction, and anus: Secondary | ICD-10-CM | POA: Diagnosis not present

## 2017-04-26 ENCOUNTER — Other Ambulatory Visit: Payer: Self-pay | Admitting: Surgery

## 2017-04-26 DIAGNOSIS — Z85048 Personal history of other malignant neoplasm of rectum, rectosigmoid junction, and anus: Secondary | ICD-10-CM

## 2017-04-28 MED FILL — HYDROCHLOROTHIAZIDE 50 MG T: 50 | 90 days supply | Qty: 45 | Fill #2

## 2017-06-01 DIAGNOSIS — S0532XS Ocular laceration without prolapse or loss of intraocular tissue, left eye, sequela: Secondary | ICD-10-CM | POA: Diagnosis not present

## 2017-06-01 DIAGNOSIS — H4032X4 Glaucoma secondary to eye trauma, left eye, indeterminate stage: Secondary | ICD-10-CM | POA: Diagnosis not present

## 2017-06-01 DIAGNOSIS — H18422 Band keratopathy, left eye: Secondary | ICD-10-CM | POA: Diagnosis not present

## 2017-06-01 DIAGNOSIS — H33052 Total retinal detachment, left eye: Secondary | ICD-10-CM | POA: Diagnosis not present

## 2017-06-09 ENCOUNTER — Ambulatory Visit
Admission: RE | Admit: 2017-06-09 | Discharge: 2017-06-09 | Disposition: A | Discharge status: Home / Self Care | Payer: 59 | Source: Ambulatory Visit | Attending: Surgery | Admitting: Surgery

## 2017-06-09 DIAGNOSIS — Z85048 Personal history of other malignant neoplasm of rectum, rectosigmoid junction, and anus: Secondary | ICD-10-CM

## 2017-06-09 DIAGNOSIS — C20 Malignant neoplasm of rectum: Secondary | ICD-10-CM | POA: Diagnosis not present

## 2017-06-09 MED ORDER — GADOBENATE DIMEGLUMINE 529 MG/ML IV SOLN
12.0000 mL | Freq: Once | INTRAVENOUS | Status: AC | PRN
  Administered 2017-06-09: 12 mL via INTRAVENOUS

## 2017-08-02 MED FILL — TIMOLOL 0.5% EYE DROPS: 0.5 | 90 days supply | Qty: 5 | Fill #0

## 2017-08-03 DIAGNOSIS — E0590 Thyrotoxicosis, unspecified without thyrotoxic crisis or storm: Secondary | ICD-10-CM | POA: Diagnosis not present

## 2017-08-03 DIAGNOSIS — R7301 Impaired fasting glucose: Secondary | ICD-10-CM | POA: Diagnosis not present

## 2017-08-03 DIAGNOSIS — I10 Essential (primary) hypertension: Secondary | ICD-10-CM | POA: Diagnosis not present

## 2017-08-03 DIAGNOSIS — Z0000 Encounter for general adult medical examination without abnormal findings: Secondary | ICD-10-CM | POA: Diagnosis not present

## 2017-08-08 MED FILL — HYDROCHLOROTHIAZIDE 50 MG T: 50 | 90 days supply | Qty: 45 | Fill #0

## 2017-08-21 DIAGNOSIS — Z85048 Personal history of other malignant neoplasm of rectum, rectosigmoid junction, and anus: Secondary | ICD-10-CM | POA: Diagnosis not present

## 2017-08-23 ENCOUNTER — Other Ambulatory Visit: Payer: Self-pay

## 2017-08-23 ENCOUNTER — Telehealth: Payer: Self-pay | Admitting: Hematology

## 2017-08-23 DIAGNOSIS — C20 Malignant neoplasm of rectum: Secondary | ICD-10-CM

## 2017-08-23 NOTE — Telephone Encounter (Signed)
Left voicemail for patient regarding Appointment per 6/18 sch msg

## 2017-08-24 ENCOUNTER — Telehealth: Payer: Self-pay

## 2017-08-24 ENCOUNTER — Inpatient Hospital Stay: Payer: 59 | Attending: Hematology

## 2017-08-24 DIAGNOSIS — C20 Malignant neoplasm of rectum: Secondary | ICD-10-CM | POA: Insufficient documentation

## 2017-08-24 DIAGNOSIS — Z9221 Personal history of antineoplastic chemotherapy: Secondary | ICD-10-CM | POA: Diagnosis not present

## 2017-08-24 DIAGNOSIS — Z923 Personal history of irradiation: Secondary | ICD-10-CM | POA: Insufficient documentation

## 2017-08-24 LAB — CBC WITH DIFFERENTIAL/PLATELET
BASOS ABS: 0.1 10*3/uL (ref 0.0–0.1)
BASOS PCT: 1 %
EOS ABS: 0.2 10*3/uL (ref 0.0–0.5)
Eosinophils Relative: 3 %
HEMATOCRIT: 36.6 % — AB (ref 38.4–49.9)
HEMOGLOBIN: 12.3 g/dL — AB (ref 13.0–17.1)
Lymphocytes Relative: 16 %
Lymphs Abs: 1 10*3/uL (ref 0.9–3.3)
MCH: 29.8 pg (ref 27.2–33.4)
MCHC: 33.6 g/dL (ref 32.0–36.0)
MCV: 88.6 fL (ref 79.3–98.0)
Monocytes Absolute: 0.4 10*3/uL (ref 0.1–0.9)
Monocytes Relative: 7 %
NEUTROS ABS: 4.4 10*3/uL (ref 1.5–6.5)
NEUTROS PCT: 73 %
Platelets: 244 10*3/uL (ref 140–400)
RBC: 4.14 MIL/uL — AB (ref 4.20–5.82)
RDW: 14.1 % (ref 11.0–14.6)
WBC: 6.1 10*3/uL (ref 4.0–10.3)

## 2017-08-24 LAB — COMPREHENSIVE METABOLIC PANEL
ALK PHOS: 77 U/L (ref 40–150)
ALT: 13 U/L (ref 0–55)
AST: 21 U/L (ref 5–34)
Albumin: 4 g/dL (ref 3.5–5.0)
Anion gap: 6 (ref 3–11)
BUN: 14 mg/dL (ref 7–26)
CALCIUM: 9.2 mg/dL (ref 8.4–10.4)
CO2: 27 mmol/L (ref 22–29)
CREATININE: 1.02 mg/dL (ref 0.70–1.30)
Chloride: 103 mmol/L (ref 98–109)
Glucose, Bld: 116 mg/dL (ref 70–140)
Potassium: 4 mmol/L (ref 3.5–5.1)
SODIUM: 136 mmol/L (ref 136–145)
TOTAL PROTEIN: 6.8 g/dL (ref 6.4–8.3)
Total Bilirubin: 0.3 mg/dL (ref 0.2–1.2)

## 2017-08-24 LAB — CEA (IN HOUSE-CHCC): CEA (CHCC-IN HOUSE): 2.57 ng/mL (ref 0.00–5.00)

## 2017-08-24 NOTE — Telephone Encounter (Signed)
-----   Message from Alla Feeling, NP sent at 08/24/2017  1:16 PM EDT ----- Please let him know labs are stable. Tumor marker remains normal. Keep scheduled follow up. Thanks, Regan Rakers, NP

## 2017-08-24 NOTE — Telephone Encounter (Signed)
Per Cira Rue NP left voice message for patient that his labs are stable and the tumor marker remains normal. Keep his scheduled f/u appointment.  Encouraged him to call back if he has questions.

## 2017-11-07 MED FILL — TIMOLOL 0.5% EYE DROPS: 0.5 | 90 days supply | Qty: 5 | Fill #1

## 2017-11-07 MED FILL — HYDROCHLOROTHIAZIDE 50 MG T: 50 | 90 days supply | Qty: 45 | Fill #1

## 2017-11-20 ENCOUNTER — Inpatient Hospital Stay: Payer: 59 | Attending: Hematology

## 2017-11-20 ENCOUNTER — Telehealth: Payer: Self-pay | Admitting: Hematology

## 2017-11-20 DIAGNOSIS — D509 Iron deficiency anemia, unspecified: Secondary | ICD-10-CM | POA: Insufficient documentation

## 2017-11-20 DIAGNOSIS — I10 Essential (primary) hypertension: Secondary | ICD-10-CM | POA: Insufficient documentation

## 2017-11-20 DIAGNOSIS — Z79899 Other long term (current) drug therapy: Secondary | ICD-10-CM | POA: Insufficient documentation

## 2017-11-20 DIAGNOSIS — Z923 Personal history of irradiation: Secondary | ICD-10-CM | POA: Insufficient documentation

## 2017-11-20 DIAGNOSIS — Z85048 Personal history of other malignant neoplasm of rectum, rectosigmoid junction, and anus: Secondary | ICD-10-CM | POA: Insufficient documentation

## 2017-11-20 DIAGNOSIS — I2510 Atherosclerotic heart disease of native coronary artery without angina pectoris: Secondary | ICD-10-CM | POA: Insufficient documentation

## 2017-11-20 DIAGNOSIS — Z7982 Long term (current) use of aspirin: Secondary | ICD-10-CM | POA: Insufficient documentation

## 2017-11-20 DIAGNOSIS — Z9221 Personal history of antineoplastic chemotherapy: Secondary | ICD-10-CM | POA: Insufficient documentation

## 2017-11-20 DIAGNOSIS — J439 Emphysema, unspecified: Secondary | ICD-10-CM | POA: Diagnosis not present

## 2017-11-20 DIAGNOSIS — Z86718 Personal history of other venous thrombosis and embolism: Secondary | ICD-10-CM | POA: Diagnosis not present

## 2017-11-20 DIAGNOSIS — I700 Atherosclerosis of aorta: Secondary | ICD-10-CM | POA: Diagnosis not present

## 2017-11-20 DIAGNOSIS — H409 Unspecified glaucoma: Secondary | ICD-10-CM | POA: Insufficient documentation

## 2017-11-20 DIAGNOSIS — G629 Polyneuropathy, unspecified: Secondary | ICD-10-CM | POA: Insufficient documentation

## 2017-11-20 DIAGNOSIS — H5440 Blindness, one eye, unspecified eye: Secondary | ICD-10-CM | POA: Insufficient documentation

## 2017-11-20 DIAGNOSIS — C20 Malignant neoplasm of rectum: Secondary | ICD-10-CM

## 2017-11-20 LAB — COMPREHENSIVE METABOLIC PANEL
ALT: 14 U/L (ref 0–44)
ANION GAP: 7 (ref 5–15)
AST: 19 U/L (ref 15–41)
Albumin: 4.1 g/dL (ref 3.5–5.0)
Alkaline Phosphatase: 80 U/L (ref 38–126)
BILIRUBIN TOTAL: 0.4 mg/dL (ref 0.3–1.2)
BUN: 19 mg/dL (ref 8–23)
CO2: 27 mmol/L (ref 22–32)
Calcium: 9.7 mg/dL (ref 8.9–10.3)
Chloride: 105 mmol/L (ref 98–111)
Creatinine, Ser: 1.18 mg/dL (ref 0.61–1.24)
GFR calc non Af Amer: >60 mL/min (ref >60)
Glucose, Bld: 87 mg/dL (ref 70–99)
Potassium: 4.5 mmol/L (ref 3.5–5.1)
SODIUM: 139 mmol/L (ref 135–145)
TOTAL PROTEIN: 7.1 g/dL (ref 6.5–8.1)

## 2017-11-20 LAB — CBC WITH DIFFERENTIAL/PLATELET
BASOS ABS: 0 10*3/uL (ref 0.0–0.1)
Basophils Relative: 1 %
Eosinophils Absolute: 0.1 10*3/uL (ref 0.0–0.5)
Eosinophils Relative: 2 %
HEMATOCRIT: 37.6 % — AB (ref 38.4–49.9)
HEMOGLOBIN: 12.5 g/dL — AB (ref 13.0–17.1)
Lymphocytes Relative: 12 %
Lymphs Abs: 0.8 10*3/uL — ABNORMAL LOW (ref 0.9–3.3)
MCH: 29.7 pg (ref 27.2–33.4)
MCHC: 33.4 g/dL (ref 32.0–36.0)
MCV: 89 fL (ref 79.3–98.0)
Monocytes Absolute: 0.5 10*3/uL (ref 0.1–0.9)
Monocytes Relative: 7 %
NEUTROS ABS: 5.2 10*3/uL (ref 1.5–6.5)
NEUTROS PCT: 78 %
Platelets: 245 10*3/uL (ref 140–400)
RBC: 4.22 MIL/uL (ref 4.20–5.82)
RDW: 14.2 % (ref 11.0–14.6)
WBC: 6.6 10*3/uL (ref 4.0–10.3)

## 2017-11-20 LAB — CEA (IN HOUSE-CHCC): CEA (CHCC-In House): 2.61 ng/mL (ref 0.00–5.00)

## 2017-11-20 NOTE — Telephone Encounter (Signed)
YF PAL 9/23 - moved f/u to 9/30. Left message with new date/time and also for patient to follow up with [facility] to schedule scan. Schedule mailed.

## 2017-11-24 ENCOUNTER — Ambulatory Visit (HOSPITAL_COMMUNITY)
Admission: EM | Admit: 2017-11-24 | Discharge: 2017-11-24 | Disposition: A | Discharge status: Home / Self Care | Payer: 59 | Attending: Urgent Care | Admitting: Urgent Care

## 2017-11-24 ENCOUNTER — Encounter (HOSPITAL_COMMUNITY): Payer: Self-pay

## 2017-11-24 ENCOUNTER — Ambulatory Visit (HOSPITAL_COMMUNITY)
Admission: RE | Admit: 2017-11-24 | Discharge: 2017-11-24 | Disposition: A | Discharge status: Home / Self Care | Payer: 59 | Source: Ambulatory Visit | Attending: Hematology | Admitting: Hematology

## 2017-11-24 DIAGNOSIS — J438 Other emphysema: Secondary | ICD-10-CM | POA: Insufficient documentation

## 2017-11-24 DIAGNOSIS — J432 Centrilobular emphysema: Secondary | ICD-10-CM | POA: Diagnosis not present

## 2017-11-24 DIAGNOSIS — C20 Malignant neoplasm of rectum: Secondary | ICD-10-CM | POA: Insufficient documentation

## 2017-11-24 DIAGNOSIS — I700 Atherosclerosis of aorta: Secondary | ICD-10-CM | POA: Diagnosis not present

## 2017-11-24 DIAGNOSIS — R519 Headache, unspecified: Secondary | ICD-10-CM

## 2017-11-24 DIAGNOSIS — R51 Headache: Secondary | ICD-10-CM

## 2017-11-24 DIAGNOSIS — L0201 Cutaneous abscess of face: Secondary | ICD-10-CM | POA: Diagnosis not present

## 2017-11-24 MED ORDER — SULFAMETHOXAZOLE-TRIMETHOPRIM 800-160 MG PO TABS: 1.0000 | ORAL_TABLET | Freq: Two times a day (BID) | ORAL | 0 refills | Status: DC

## 2017-11-24 MED ORDER — IOHEXOL 300 MG/ML  SOLN
100.0000 mL | Freq: Once | INTRAMUSCULAR | Status: AC | PRN
  Administered 2017-11-24: 100 mL via INTRAVENOUS

## 2017-11-24 MED FILL — SULFAMETHOXAZOLE-TMP DS TAB: 800-160 | 10 days supply | Qty: 20 | Fill #0

## 2017-11-24 NOTE — ED Provider Notes (Signed)
MRN: [id number] DOB: [birth year]  Subjective:   Juan David is a 62 y.o. male presenting for 5-day history of left-sided facial pain, swelling with slight drainage.  Patient reports that he tried to lance the area himself last night with a needle but did not get any drainage.  Has not tried medications for relief.  Denies active pain currently.  No current facility-administered medications for this encounter.   Current Outpatient Medications:  .  aspirin 325 MG tablet, Take 325 mg by mouth daily., Disp: , Rfl:  .  Ferrous Sulfate (IRON) 325 (65 Fe) MG TABS, Take 4 tablets by mouth every morning. , Disp: , Rfl:  .  hydrochlorothiazide (HYDRODIURIL) 50 MG tablet, Take 25 mg by mouth every morning. , Disp: , Rfl: 0 .  hypromellose (GENTEAL) 0.3 % GEL ophthalmic ointment, Place 1 application into the left eye as needed. , Disp: , Rfl:  .  Multiple Vitamin (MULTI-VITAMINS) TABS, Take 1 tablet by mouth daily. Adult chewable, Disp: , Rfl:  .  Potassium 95 MG TABS, Take 1 tablet by mouth every morning., Disp: , Rfl:  .  prednisoLONE acetate (PRED FORTE) 1 % ophthalmic suspension, Place 1 drop into the left eye as needed., Disp: , Rfl:  .  prochlorperazine (COMPAZINE) 10 MG tablet, Take 1 tablet (10 mg total) by mouth every 6 (six) hours as needed for nausea or vomiting. (Patient not taking: Reported on 11/21/2016), Disp: 30 tablet, Rfl: 0 .  timolol (TIMOPTIC) 0.5 % ophthalmic solution, Place 1 drop into the left eye every morning. , Disp: , Rfl: 11   No Known Allergies  Past Medical History:  Diagnosis Date  . Arthritis   . Chronic anemia   . History of cardiovascular stress test    per pt in 1980's , told was normal  . History of DVT of lower extremity    post right knee surgery 1998  lower extremitiy  treated w/ coumadin for a year/  per pt no dvt since  . 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 Hypertension   . Iron deficiency     . Legally blind in left eye, as defined in [place]    per pt only see light  . PFO (patent foramen ovale)    per TEE done 05-11-2009   . 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 Wears glasses      Past Surgical History:  Procedure Laterality Date  . ARTHROSCOPIC REPAIR ACL Right 1998  . EXPLORATION AND REPAIR LEFT EYE INJURY  08-18-2001   [place]   ruptured globe- repair corneal laceration, reposition of prolapsed uveal tissue  . HEMORRHOID SURGERY N/A 09/01/2016   Procedure: HEMORRHOIDECTOMY;  Surgeon: Leighton Ruff, MD;  Location[facility];  Service: General;  Laterality: N/A;  . SUPERFICIAL KERATECTOMY Left 06-29-2011    [facility]    with EDTA scrub of left eye  . TEE WITHOUT CARDIOVERSION  05-11-2009  dr Dorris Carnes   LVSEF 55-65%/  mild thickened AV without AI/  trace MR/ mixed fixed artherosclerosis plaqueing thoracic aorta/  no evidence thrombus/  by agitated saling and color doppler there was a PFO    Objective:   Vitals: BP (!) 145/83 (BP Location: Right Arm)   Pulse 75   Temp 98.3 F (36.8 C) (Oral)   Resp 20   SpO2  100%   Physical Exam  HENT:  Head:     PROCEDURE NOTE: I&D of Abscess Verbal consent obtained. Local anesthesia with 1cc of 2% lidocaine epinephrine. Site cleansed with alcohol swab. Incision of 0.5cm was made using a 11 blade, discharge of amounts of pus with loculations and serosanguinous fluid. Wound cavity was explored with curved hemostats with less than 1/2 centimeter depth in all directions. Cleansed and dressed.   Assessment and Plan :   Facial abscess  Facial pain  Not amenable to packing.  We will have patient start Bactrim, wound care reviewed.  Return to clinic if symptoms persist or worsen.     Jaynee Eagles, PA-C 11/24/17 1606

## 2017-11-24 NOTE — ED Triage Notes (Signed)
Pt presents with abscess on left face area right along jaw line.

## 2017-11-27 ENCOUNTER — Ambulatory Visit: Payer: 59 | Admitting: Hematology

## 2017-12-03 NOTE — Progress Notes (Signed)
[name]  [phone] [phone]  Clinic Follow up Note   Patient Care Team: Leonard Downing, MD as PCP - General (Family Medicine) Leighton Ruff, MD as Consulting Physician (General Surgery) Kyung Rudd, MD as Consulting Physician (Radiation Oncology) Truitt Merle, MD as Consulting Physician (Hematology)   Date of Service:  12/04/2017  Chief Complaint: Follow up rectal cancer  SUMMARY OF ONCOLOGIC HISTORY: Oncology History   Cancer Staging Rectal adenocarcinoma [name]) Staging form: Colon and Rectum, AJCC 8th Edition - Pathologic stage from 09/01/2016: Stage I (pT2, pN0, cM0) - Signed by Truitt Merle, MD on 10/01/2016       Rectal adenocarcinoma ([name])   09/01/2016 Initial Diagnosis    Rectal adenocarcinoma ([name])    09/01/2016 Surgery    Hemorrhoidectomy 09/01/2016. Noted to have a mobile right lateral anal canal mass. Dr. Leighton Ruff.     09/01/2016 Pathology Results    Invasive adenocarcinoma, well-differentiated, spanning 1.3 cm. The tumor invaded into the anal sphincter muscle. Resection margins were negative. The pathologist notes the tumor would be best staged as pT2 given involvement of muscle.     09/06/2016 Imaging    CT scans chest/abdomen/pelvis 09/06/2016 showed no evidence of metastatic disease.     09/22/2016 Imaging    Pelvic MRI 09/22/2016 showed no definite residual anal tumor. There was no evidence of tumor involvement of the external sphincter or adjacent organs. There was no pelvic lymphadenopathy.    10/10/2016 - 11/22/2016 Radiation Therapy    Patient begun adjuvant radiation with Dr. Lisbeth Renshaw    10/10/2016 - 11/22/2016 Chemotherapy    xeloda 1500mg , twice daily.    11/24/2017 Imaging    CT CAP W contrast 11/24/17  IMPRESSION: 1. Stable exam. No new or progressive interval findings to suggest metastatic disease. 2.  Aortic Atherosclerois (ICD10-170.0) 3.  Emphysema. ([id number]-N56.9)     HPI (Hisory of Present  Illness): Juan David is a 62 y.o. male here because of a recently diagnosed rectal cancer. She initially saw Ned Card. Per her notes, patient had a 6 month history of an anal mass that progressed in size over time. He went to the ED of 07/26/2016 with complaints of rectal bleeding. He underwent a hemorrhoidectomy on 08/26/2016 and was noted to have a mobile right lateral anal canal mass that invaded the anal sphincter muscle. The pathology showed an invasive adenocarcinoma, well-differentiated, spanning 1.3cm. The resection margins were negative. Pathologist notes that tumor would be best staged at pT2 given muscle involvement. On 09/06/2016, CT CAP showed no evidence of metastatic disease. Pelvic MRI on 11/23/2016 showed no definite anal tumor.  He has seen Dr. Lisbeth Renshaw who reccomends adjuvant chemoradiation with plans to begin 10/10/2016.  CURRENT THERAPY: Surveillance  INTERVAL HISTORY:  Juan David is here to follow up on his rectal cancer. He was last seen by me 8 months ago. He presents to the clinic today alone. He is doing well and has no new complaints. His appetite is good and he denies abdominal pain or changes in BMs. His neuropathy is improving.   He will see Dr. Marcello Moores by the end of the year.  REVIEW OF SYSTEMS:   Constitutional: Denies fevers, chills  Eyes: Denies blurriness of vision Ears, nose, mouth, throat, and face: Denies mucositis or sore throat Skin: (+) facial mole, stable since birth Respiratory: Denies cough, dyspnea or wheezes Cardiovascular: Denies palpitation, chest discomfort or lower extremity swelling Gastrointestinal:  Denies nausea, heartburn Skin: Denies abnormal skin rashes Lymphatics: Denies  new lymphadenopathy or easy bruising Neurological:Denies numbness, tingling or new weaknesses Behavioral/Psych: Mood is stable, no new changes  All other systems were reviewed with the patient and are negative.  MEDICAL HISTORY:  Past Medical History:  Diagnosis  Date  . Arthritis   . Chronic anemia   . History of cardiovascular stress test    per pt in 1980's , told was normal  . History of DVT of lower extremity    post right knee surgery 1998  lower extremitiy  treated w/ coumadin for a year/  per pt no dvt since  . 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 Hypertension   . Iron deficiency   . Legally blind in left eye, as defined in [place]    per pt only see light  . PFO (patent foramen ovale)    per TEE done 05-11-2009   . Traumatic glaucoma, 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 at [facility] in [place]   08-18-2001  . Wears glasses     SURGICAL HISTORY: Past Surgical History:  Procedure Laterality Date  . ARTHROSCOPIC REPAIR ACL Right 1998  . EXPLORATION AND REPAIR LEFT EYE INJURY  08-18-2001   [place]   ruptured globe- repair corneal laceration, reposition of prolapsed uveal tissue  . HEMORRHOID SURGERY N/A 09/01/2016   Procedure: HEMORRHOIDECTOMY;  Surgeon: Leighton Ruff, MD;  Location: [facility];  Service: General;  Laterality: N/A;  . SUPERFICIAL KERATECTOMY Left 06-29-2011    [facility]    with EDTA scrub of left eye  . TEE WITHOUT CARDIOVERSION  05-11-2009  dr Dorris Carnes   LVSEF 55-65%/  mild thickened AV without AI/  trace MR/ mixed fixed artherosclerosis plaqueing thoracic aorta/  no evidence thrombus/  by agitated saling and color doppler there was a PFO    I have reviewed the social history and family history with the patient and they are unchanged from previous note.  ALLERGIES:  has No Known Allergies.  MEDICATIONS:  Current Outpatient Medications  Medication Sig Dispense Refill  . aspirin 325 MG tablet Take 325 mg by mouth daily.    . Ferrous Sulfate (IRON) 325 (65 Fe) MG TABS Take 4 tablets by mouth every morning.     . hydrochlorothiazide (HYDRODIURIL) 50 MG tablet Take 25 mg by mouth every morning.   0  . hypromellose  (GENTEAL) 0.3 % GEL ophthalmic ointment Place 1 application into the left eye as needed.     . Multiple Vitamin (MULTI-VITAMINS) TABS Take 1 tablet by mouth daily. Adult chewable    . Potassium 95 MG TABS Take 1 tablet by mouth every morning.    . prednisoLONE acetate (PRED FORTE) 1 % ophthalmic suspension Place 1 drop into the left eye as needed.    . prochlorperazine (COMPAZINE) 10 MG tablet Take 1 tablet (10 mg total) by mouth every 6 (six) hours as needed for nausea or vomiting. 30 tablet 0  . sulfamethoxazole-trimethoprim (BACTRIM DS,SEPTRA DS) 800-160 MG tablet Take 1 tablet by mouth 2 (two) times daily. 20 tablet 0  . timolol (TIMOPTIC) 0.5 % ophthalmic solution Place 1 drop into the left eye every morning.   11   No current facility-administered medications for this visit.     PHYSICAL EXAMINATION:  ECOG PERFORMANCE STATUS: 0 - Asymptomatic  Vitals:   12/04/17 1138  BP: 134/83  Pulse: 74  Resp: 18  Temp: 98.4 F (36.9 C)  SpO2: 100%   Filed Weights   12/04/17 1138  Weight: 128 lb 11.2 oz (58.4 kg)   GENERAL:alert, no distress and comfortable SKIN: skin color, texture, turgor are normal, no rashes or significant lesions (+) facial mole, stable since birth EYES: normal, Conjunctiva are pink and non-injected, sclera clear OROPHARYNX:no exudate, no erythema and lips, buccal mucosa, and tongue normal  NECK: supple, thyroid normal size, non-tender, without nodularity LYMPH:  no palpable lymphadenopathy in the cervical, axillary or inguinal LUNGS: clear to auscultation and percussion with normal breathing effort HEART: regular rate & rhythm and no murmurs and no lower extremity edema ABDOMEN:abdomen soft, non-tender and normal bowel sounds Musculoskeletal:no cyanosis of digits and no clubbing  NEURO: alert & oriented x 3 with fluent speech, no focal motor/sensory deficits Rectal exam: normal. There are some internal hemorrids. No bleeding or masses  LABORATORY DATA:  I have  reviewed the data as listed CBC Latest Ref Rng & Units 11/20/2017 08/24/2017 03/27/2017  WBC 4.0 - 10.3 K/uL 6.6 6.1 7.2  Hemoglobin 13.0 - 17.1 g/dL 12.5(L) 12.3(L) 12.4(L)  Hematocrit 38.4 - 49.9 % 37.6(L) 36.6(L) 37.1(L)  Platelets 140 - 400 K/uL 245 244 229     CMP Latest Ref Rng & Units 11/20/2017 08/24/2017 03/27/2017  Glucose 70 - 99 mg/dL 87 116 91  BUN 8 - 23 mg/dL 19 14 14   Creatinine 0.61 - 1.24 mg/dL 1.18 1.02 0.97  Sodium 135 - 145 mmol/L 139 136 138  Potassium 3.5 - 5.1 mmol/L 4.5 4.0 4.2  Chloride 98 - 111 mmol/L 105 103 105  CO2 22 - 32 mmol/L 27 27 26   Calcium 8.9 - 10.3 mg/dL 9.7 9.2 9.2  Total Protein 6.5 - 8.1 g/dL 7.1 6.8 7.0  Total Bilirubin 0.3 - 1.2 mg/dL 0.4 0.3 0.3  Alkaline Phos 38 - 126 U/L 80 77 82  AST 15 - 41 U/L 19 21 18   ALT 0 - 44 U/L 14 13 18    PATHOLOGY REPORT: Diagnosis 09/01/2016 Anus, biopsy, canal mass - INVASIVE ADENOCARCINOMA, WELL DIFFERENTIATED, SPANNING 1.3 CM. - TUMOR INVADES INTO ANAL SPHINCTER MUSCLE. - RESECTION MARGINS ARE NEGATIVE. Microscopic Comment Dr. Melina Copa has reviewed the case. Dr. Marcello Moores was paged on 09/02/2016. ADDITIONAL INFORMATION: The tumor would best be staged as a pT2 given involvement of muscle.   RADIOGRAPHIC STUDIES:   CT CAP W contrast 11/24/17  IMPRESSION: 1. Stable exam. No new or progressive interval findings to suggest metastatic disease. 2.  Aortic Atherosclerois (ICD10-170.0) 3.  Emphysema. (HEN27-P82.9)   MR Pelvis w wo Contrast 09/22/2016 IMPRESSION: No definite residual anal tumor identified. No evidence of tumor involvement of the external sphincter or adjacent organs. No pelvic Lymphadenopathy.  CT Chest, Abdomen, Pelvic w Contrast 09/06/2016 IMPRESSION: 1. No evidence to suggest metastatic disease in the chest, abdomen or pelvis. 2. Aortic atherosclerosis, in addition to 2 vessel coronary artery disease. Please note that although the presence of coronary artery calcium documents the  presence of coronary artery disease, the severity of this disease and any potential stenosis cannot be assessed on this non-gated CT examination. Assessment for potential risk factor modification, dietary therapy or pharmacologic therapy may be warranted, if clinically indicated. 3. Additional incidental findings, as above.  I have personally reviewed the radiological images as listed and agreed with the findings in the report. No results found.   ASSESSMENT & PLAN: 62 y.o.-year-old African-American male, with past medical history of hypertension, iron deficiency, glaucoma, remote history of DVT, presented with rectal mass and rectal  bleeding.  1. Adenocarcinoma of the distal rectum, pT2cN0M0, stage I -He is status post right lateral anal/low rectum mass resection by Dr. Marcello Moores. His surgical path reviewed a T2 tumor, invasive adenocarcinoma. Pelvic MRI showed no residual tumor and no adenopathy. This was reviewed with patient previously. -Standard surgery APR was discussed with patient, he declined. -Case was previously  discussed in the tumor board, we recommend him to undergo concurrent chemoradiation, to reduce his risk of local recurrence.  -Patient has completed adjuvant concurrent chemo and radiation 10/10/16-11/22/16, tolerated well overall. -I discussed his CT CAP from 11/24/17 with pt which shows NED -He has recovered well, asymptomatic, exam was unremarkable including rectal exam, lab reviewed, no clinical concern for recurrence. - Due to his early stage disease, if the next scan is negative, we may not have to repeat his surveillance CT scans. Will continue cancer surveillance with lab and routine follow-up. -He will see Dr. Marcello Moores in 3 months, see me back in 6 months.    PLAN: -f/u and labs in 6 months -repeat scan at end of next year.  No problem-specific Assessment & Plan notes found for this encounter.   No orders of the defined types were placed in this encounter.  All  questions were answered. The patient knows to call the clinic with any problems, questions or concerns. No barriers to learning was detected.  I spent 15 minutes counseling the patient face to face. The total time spent in the appointment was 20 minutes and more than 50% was on counseling and review of test results  I, Noor Dweik am acting as scribe for Dr. Truitt Merle.  I have reviewed the above documentation for accuracy and completeness, and I agree with the above.    Truitt Merle  12/04/2017

## 2017-12-04 ENCOUNTER — Telehealth: Payer: Self-pay | Admitting: Hematology

## 2017-12-04 ENCOUNTER — Inpatient Hospital Stay (HOSPITAL_BASED_OUTPATIENT_CLINIC_OR_DEPARTMENT_OTHER): Payer: 59 | Admitting: Hematology

## 2017-12-04 VITALS — BP 134/83 | HR 74 | Temp 98.4°F | Resp 18 | Ht 70.5 in | Wt 128.7 lb

## 2017-12-04 DIAGNOSIS — C20 Malignant neoplasm of rectum: Secondary | ICD-10-CM

## 2017-12-04 DIAGNOSIS — H409 Unspecified glaucoma: Secondary | ICD-10-CM

## 2017-12-04 DIAGNOSIS — I700 Atherosclerosis of aorta: Secondary | ICD-10-CM | POA: Diagnosis not present

## 2017-12-04 DIAGNOSIS — Z85048 Personal history of other malignant neoplasm of rectum, rectosigmoid junction, and anus: Secondary | ICD-10-CM | POA: Diagnosis not present

## 2017-12-04 DIAGNOSIS — J439 Emphysema, unspecified: Secondary | ICD-10-CM | POA: Diagnosis not present

## 2017-12-04 DIAGNOSIS — D509 Iron deficiency anemia, unspecified: Secondary | ICD-10-CM

## 2017-12-04 DIAGNOSIS — G629 Polyneuropathy, unspecified: Secondary | ICD-10-CM

## 2017-12-04 DIAGNOSIS — Z9221 Personal history of antineoplastic chemotherapy: Secondary | ICD-10-CM

## 2017-12-04 DIAGNOSIS — H5440 Blindness, one eye, unspecified eye: Secondary | ICD-10-CM | POA: Diagnosis not present

## 2017-12-04 DIAGNOSIS — I10 Essential (primary) hypertension: Secondary | ICD-10-CM | POA: Diagnosis not present

## 2017-12-04 DIAGNOSIS — I2510 Atherosclerotic heart disease of native coronary artery without angina pectoris: Secondary | ICD-10-CM

## 2017-12-04 DIAGNOSIS — Z923 Personal history of irradiation: Secondary | ICD-10-CM

## 2017-12-04 DIAGNOSIS — Z86718 Personal history of other venous thrombosis and embolism: Secondary | ICD-10-CM

## 2017-12-04 DIAGNOSIS — Z7982 Long term (current) use of aspirin: Secondary | ICD-10-CM

## 2017-12-04 NOTE — Telephone Encounter (Signed)
Appt scheduled letter/calendar mailed per 9/30 los

## 2017-12-05 ENCOUNTER — Encounter: Payer: Self-pay | Admitting: Hematology

## 2017-12-05 DIAGNOSIS — S0532XS Ocular laceration without prolapse or loss of intraocular tissue, left eye, sequela: Secondary | ICD-10-CM | POA: Diagnosis not present

## 2017-12-05 DIAGNOSIS — H4032X4 Glaucoma secondary to eye trauma, left eye, indeterminate stage: Secondary | ICD-10-CM | POA: Diagnosis not present

## 2017-12-05 DIAGNOSIS — H18422 Band keratopathy, left eye: Secondary | ICD-10-CM | POA: Diagnosis not present

## 2018-01-26 MED FILL — HYDROCHLOROTHIAZIDE 50 MG T: 50 | 90 days supply | Qty: 45 | Fill #2

## 2018-01-26 MED FILL — TIMOLOL 0.5% EYE DROPS: 0.5 | 90 days supply | Qty: 5 | Fill #2

## 2018-05-07 MED FILL — HYDROCHLOROTHIAZIDE 50 MG T: 50 | 90 days supply | Qty: 45 | Fill #3

## 2018-05-15 MED FILL — TIMOLOL 0.5% EYE DROPS: 0.5 | 90 days supply | Qty: 5 | Fill #3

## 2018-05-31 ENCOUNTER — Other Ambulatory Visit: Payer: Self-pay

## 2018-05-31 DIAGNOSIS — C20 Malignant neoplasm of rectum: Secondary | ICD-10-CM

## 2018-06-04 ENCOUNTER — Inpatient Hospital Stay: Payer: 59 | Attending: Family Medicine

## 2018-06-04 ENCOUNTER — Other Ambulatory Visit: Payer: Self-pay

## 2018-06-04 ENCOUNTER — Inpatient Hospital Stay: Payer: 59 | Admitting: Hematology

## 2018-08-06 MED FILL — HYDROCHLOROTHIAZIDE 50 MG T: 50 | 90 days supply | Qty: 45 | Fill #0

## 2018-09-27 MED FILL — TIMOLOL 0.5% EYE DROPS: 0.5 | 90 days supply | Qty: 10 | Fill #0

## 2018-10-02 DIAGNOSIS — D649 Anemia, unspecified: Secondary | ICD-10-CM | POA: Diagnosis not present

## 2018-10-02 DIAGNOSIS — Z0000 Encounter for general adult medical examination without abnormal findings: Secondary | ICD-10-CM | POA: Diagnosis not present

## 2018-10-02 DIAGNOSIS — R7301 Impaired fasting glucose: Secondary | ICD-10-CM | POA: Diagnosis not present

## 2018-10-03 DIAGNOSIS — Z85038 Personal history of other malignant neoplasm of large intestine: Secondary | ICD-10-CM | POA: Diagnosis not present

## 2018-10-03 DIAGNOSIS — Z0000 Encounter for general adult medical examination without abnormal findings: Secondary | ICD-10-CM | POA: Diagnosis not present

## 2018-10-03 DIAGNOSIS — D649 Anemia, unspecified: Secondary | ICD-10-CM | POA: Diagnosis not present

## 2018-10-09 DIAGNOSIS — H1789 Other corneal scars and opacities: Secondary | ICD-10-CM | POA: Diagnosis not present

## 2018-10-09 DIAGNOSIS — Z7952 Long term (current) use of systemic steroids: Secondary | ICD-10-CM | POA: Diagnosis not present

## 2018-10-09 DIAGNOSIS — H33052 Total retinal detachment, left eye: Secondary | ICD-10-CM | POA: Diagnosis not present

## 2018-10-09 DIAGNOSIS — W208XXS Other cause of strike by thrown, projected or falling object, sequela: Secondary | ICD-10-CM | POA: Diagnosis not present

## 2018-10-09 DIAGNOSIS — S0532XD Ocular laceration without prolapse or loss of intraocular tissue, left eye, subsequent encounter: Secondary | ICD-10-CM | POA: Diagnosis not present

## 2018-10-09 DIAGNOSIS — Y93H2 Activity, gardening and landscaping: Secondary | ICD-10-CM | POA: Diagnosis not present

## 2018-10-09 DIAGNOSIS — S0532XS Ocular laceration without prolapse or loss of intraocular tissue, left eye, sequela: Secondary | ICD-10-CM | POA: Diagnosis not present

## 2018-10-09 DIAGNOSIS — H4032X4 Glaucoma secondary to eye trauma, left eye, indeterminate stage: Secondary | ICD-10-CM | POA: Diagnosis not present

## 2018-10-09 DIAGNOSIS — H18422 Band keratopathy, left eye: Secondary | ICD-10-CM | POA: Diagnosis not present

## 2018-10-17 ENCOUNTER — Encounter: Payer: Self-pay | Admitting: Pulmonary Disease

## 2018-10-17 ENCOUNTER — Ambulatory Visit (INDEPENDENT_AMBULATORY_CARE_PROVIDER_SITE_OTHER): Payer: 59 | Admitting: Pulmonary Disease

## 2018-10-17 ENCOUNTER — Other Ambulatory Visit: Payer: Self-pay

## 2018-10-17 VITALS — BP 122/82 | HR 85 | Temp 98.6°F | Ht 70.5 in | Wt 127.0 lb

## 2018-10-17 DIAGNOSIS — R0602 Shortness of breath: Secondary | ICD-10-CM | POA: Diagnosis not present

## 2018-10-17 DIAGNOSIS — J439 Emphysema, unspecified: Secondary | ICD-10-CM | POA: Diagnosis not present

## 2018-10-17 DIAGNOSIS — R636 Underweight: Secondary | ICD-10-CM

## 2018-10-17 DIAGNOSIS — F17200 Nicotine dependence, unspecified, uncomplicated: Secondary | ICD-10-CM

## 2018-10-17 NOTE — Progress Notes (Signed)
Synopsis: Referred in August 2020 for emphysema by Leonard Downing, *  Subjective:   PATIENT ID: Juan David GENDER: male DOB: September 04, 1955, MRN: [id number]  Chief Complaint  Patient presents with  . Consult    Referral from Dr. Redmond Pulling. States he was told he might have COPD. He reports he has occasional SOB. Denies cough. Smokes 1 cigar/day.     PMH of rectal cancer, resection and chemo, smoker for the 35 years, heaviest was smoking 0.5ppd, and now down to one per day. Wife living in [place] taking care of family and he lives here. Works at [employer]. 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 Never had PFTs. He had bronchitis as a kid. No other asthma symptoms. H/o of blood clot after knee surgery in 1998.  Overall he states that he believes he was referred here after an abnormality seen on CT imaging.  Patient had CT chest abdomen pelvis for staging of his rectal cancer back in September 2019.  Imaging of the chest revealed subpleural paraseptal emphysema.  Patient was referred here for recommendations and evaluation.  At baseline he continues to smoke 1 cigarette a day.  He is able to take the dog out.  He has no real significant respiratory complaints.  Patient denies hemoptysis or chest pain.   Past Medical History:  Diagnosis Date  . Arthritis   . Chronic anemia   . History of cardiovascular stress test    per pt in 1980's , told was normal  . History of DVT of lower extremity    post right knee surgery 1998  lower extremitiy  treated w/ coumadin for a year/  per pt no dvt since  . 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 Hypertension   . Iron deficiency   . Legally blind in left eye, as defined in [place]    per pt only see light  . PFO (patent foramen ovale)    per TEE done 05-11-2009   . 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 Wears glasses      Family History  Problem Relation Age of Onset  . Colon cancer Mother      Past Surgical History:  Procedure Laterality Date  . ARTHROSCOPIC REPAIR ACL Right 1998  . EXPLORATION AND REPAIR LEFT EYE INJURY  08-18-2001   [place]   ruptured globe- repair corneal laceration, reposition of prolapsed uveal tissue  . HEMORRHOID SURGERY N/A 09/01/2016   Procedure: HEMORRHOIDECTOMY;  Surgeon: Leighton Ruff, MD;  Location: [facility];  Service: General;  Laterality: N/A;  . SUPERFICIAL KERATECTOMY Left 06-29-2011    [facility]    with EDTA scrub of left eye  . TEE WITHOUT CARDIOVERSION  05-11-2009  dr Dorris Carnes   LVSEF 55-65%/  mild thickened AV without AI/  trace MR/ mixed fixed artherosclerosis plaqueing thoracic aorta/  no evidence thrombus/  by agitated saling and color doppler there was a PFO    Social History   Socioeconomic History  . Marital status: Married    Spouse name: Not on file  . Number of children: Not on file  . Years of education: Not on file  . Highest education level: Not on file  Occupational History  . Not on file  Social Needs  . Emergency planning/management officer  strain: Not on file  . Food insecurity    Worry: Not on file    Inability: Not on file  . Transportation needs    Medical: Not on file    Non-medical: Not on file  Tobacco Use  . Smoking status: Current Every Day Smoker    Years: 30.00    Types: Cigars, Cigarettes  . Smokeless tobacco: Former Systems developer    Types: Snuff    Quit date: 08/25/2008  . Tobacco comment: per pt currently smoke small cigar x2 daily (average) previous smoked cigeratte until 2010  Substance and Sexual Activity  . Alcohol use: Yes    Comment: occasionally  . Drug use: Not on file  . Sexual activity: Not on file  Lifestyle  . Physical activity    Days per week: Not on file    Minutes per session: Not on file  . Stress: Not on file  Relationships  . Social Product manager on phone: Not on file    Gets together: Not on file    Attends religious service: Not on file    Active member of club or organization: Not on file    Attends meetings of clubs or organizations: Not on file    Relationship status: Not on file  . Intimate partner violence    Fear of current or ex partner: Not on file    Emotionally abused: Not on file    Physically abused: Not on file    Forced sexual activity: Not on file  Other Topics Concern  . Not on file  Social History Narrative  . Not on file     No Known Allergies   Outpatient Medications Prior to Visit  Medication Sig Dispense Refill  . aspirin 325 MG tablet Take 325 mg by mouth daily.    . Ferrous Sulfate (IRON) 325 (65 Fe) MG TABS Take 4 tablets by mouth every morning.     . hydrochlorothiazide (HYDRODIURIL) 50 MG tablet Take 25 mg by mouth every morning.   0  . hypromellose (GENTEAL) 0.3 % GEL ophthalmic ointment Place 1 application into the left eye as needed.     . Multiple Vitamin (MULTI-VITAMINS) TABS Take 1 tablet by mouth daily. Adult chewable    . Potassium 95 MG TABS Take 1 tablet by mouth every morning.    . prednisoLONE acetate (PRED FORTE) 1 % ophthalmic suspension Place 1 drop into the left eye as needed.    . prochlorperazine (COMPAZINE) 10 MG tablet Take 1 tablet (10 mg total) by mouth every 6 (six) hours as needed for nausea or vomiting. 30 tablet 0  . sulfamethoxazole-trimethoprim (BACTRIM DS,SEPTRA DS) 800-160 MG tablet Take 1 tablet by mouth 2 (two) times daily. 20 tablet 0  . timolol (TIMOPTIC) 0.5 % ophthalmic solution Place 1 drop into the left eye every morning.   11   No facility-administered medications prior to visit.     Review of Systems  Constitutional: Negative for chills, fever, malaise/fatigue and weight loss.  HENT: Negative for hearing loss, sore throat and tinnitus.   Eyes: Negative for blurred vision and double vision.  Respiratory: Positive for shortness of breath. Negative  for cough, hemoptysis, sputum production, wheezing and stridor.   Cardiovascular: Negative for chest pain, palpitations, orthopnea, leg swelling and PND.  Gastrointestinal: Negative for abdominal pain, constipation, diarrhea, heartburn, nausea and vomiting.  Genitourinary: Negative for dysuria, hematuria and urgency.  Musculoskeletal: Negative for joint pain and myalgias.  Skin: Negative for  itching and rash.  Neurological: Negative for dizziness, tingling, weakness and headaches.  Endo/Heme/Allergies: Negative for environmental allergies. Does not bruise/bleed easily.  Psychiatric/Behavioral: Negative for depression. The patient is not nervous/anxious and does not have insomnia.   All other systems reviewed and are negative.    Objective:  Physical Exam Vitals signs reviewed.  Constitutional:      General: He is not in acute distress.    Appearance: He is well-developed.     Comments: Thin   HENT:     Head: Normocephalic and atraumatic.  Eyes:     General: No scleral icterus.    Conjunctiva/sclera: Conjunctivae normal.     Pupils: Pupils are equal, round, and reactive to light.  Neck:     Musculoskeletal: Neck supple.     Vascular: No JVD.     Trachea: No tracheal deviation.  Cardiovascular:     Rate and Rhythm: Normal rate and regular rhythm.     Heart sounds: Normal heart sounds. No murmur.  Pulmonary:     Effort: Pulmonary effort is normal. No tachypnea, accessory muscle usage or respiratory distress.     Breath sounds: Normal breath sounds. No stridor. No wheezing, rhonchi or rales.  Abdominal:     General: Bowel sounds are normal. There is no distension.     Palpations: Abdomen is soft.     Tenderness: There is no abdominal tenderness.  Musculoskeletal:        General: No tenderness.  Lymphadenopathy:     Cervical: No cervical adenopathy.  Skin:    General: Skin is warm and dry.     Capillary Refill: Capillary refill takes less than 2 seconds.     Findings: No  rash.  Neurological:     Mental Status: He is alert and oriented to person, place, and time.  Psychiatric:        Behavior: Behavior normal.      Vitals:   10/17/18 1537  BP: 122/82  Pulse: 85  Temp: 98.6 F (37 C)  TempSrc: Oral  SpO2: 99%  Weight: 127 lb (57.6 kg)  Height: 5' 10.5" (1.791 m)   99% on RA BMI Readings from Last 3 Encounters:  10/17/18 17.97 kg/m  12/04/17 18.21 kg/m  03/27/17 19.54 kg/m   Wt Readings from Last 3 Encounters:  10/17/18 127 lb (57.6 kg)  12/04/17 128 lb 11.2 oz (58.4 kg)  03/27/17 138 lb 1.6 oz (62.6 kg)     CBC    Component Value Date/Time   WBC 6.6 11/20/2017 0948   RBC 4.22 11/20/2017 0948   HGB 12.5 (L) 11/20/2017 0948   HGB 12.6 (L) 12/27/2016 1237   HCT 37.6 (L) 11/20/2017 0948   HCT 38.0 (L) 12/27/2016 1237   PLT 245 11/20/2017 0948   PLT 268 12/27/2016 1237   MCV 89.0 11/20/2017 0948   MCV 92.0 12/27/2016 1237   MCH 29.7 11/20/2017 0948   MCHC 33.4 11/20/2017 0948   RDW 14.2 11/20/2017 0948   RDW 16.5 (H) 12/27/2016 1237   LYMPHSABS 0.8 (L) 11/20/2017 0948   LYMPHSABS 0.6 (L) 12/27/2016 1237   MONOABS 0.5 11/20/2017 0948   MONOABS 0.5 12/27/2016 1237   EOSABS 0.1 11/20/2017 0948   EOSABS 0.1 12/27/2016 1237   BASOSABS 0.0 11/20/2017 0948   BASOSABS 0.0 12/27/2016 1237    Chest Imaging: 11/24/2017: CT chest, with paraseptal emphysema no evidence of metastatic disease. The patient's images have been independently reviewed by me.    Pulmonary Functions Testing Results: No  flowsheet data found.  FeNO: None   Pathology: None   Echocardiogram: None   Heart Catheterization: None     Assessment & Plan:     ICD-10-CM   1. SOB (shortness of breath)  R06.02 Pulmonary Function Test  2. Pulmonary emphysema, unspecified emphysema type ([place])  J43.9 Pulmonary Function Test  3. Current smoker  F17.200   4. Low weight  R63.6     Discussion:   63 year old gentleman with a history of rectal cancer status post  resection and chemo.  Staging CT images in 2019 revealed evidence of paraseptal emphysema.  Patient was referred for further evaluation.  He is a current smoker.  He is at his highest smoked half a pack a day.  He currently smokes 1 cigarette a day.  To evaluate his potential for COPD we will obtain pulmonary function tests. He likely has mild disease.  He has very little respiratory symptoms.  We will schedule these next available.  He can follow-up with 1 of our nurse practitioners to review the PFT results. If mild disease present could consider as needed albuterol.  Encourage smoking cessation.  Patient was counseled on smoking cessation.  He understands that he just needs to give up his last cigarette a day.  Greater than 50% of this patient's 45-minute office visit was been face-to-face discussing above recommendations treatment plan and review of CT images with patient in the office.   Current Outpatient Medications:  .  aspirin 325 MG tablet, Take 325 mg by mouth daily., Disp: , Rfl:  .  Ferrous Sulfate (IRON) 325 (65 Fe) MG TABS, Take 4 tablets by mouth every morning. , Disp: , Rfl:  .  hydrochlorothiazide (HYDRODIURIL) 50 MG tablet, Take 25 mg by mouth every morning. , Disp: , Rfl: 0 .  hypromellose (GENTEAL) 0.3 % GEL ophthalmic ointment, Place 1 application into the left eye as needed. , Disp: , Rfl:  .  Multiple Vitamin (MULTI-VITAMINS) TABS, Take 1 tablet by mouth daily. Adult chewable, Disp: , Rfl:  .  Potassium 95 MG TABS, Take 1 tablet by mouth every morning., Disp: , Rfl:  .  prednisoLONE acetate (PRED FORTE) 1 % ophthalmic suspension, Place 1 drop into the left eye as needed., Disp: , Rfl:  .  prochlorperazine (COMPAZINE) 10 MG tablet, Take 1 tablet (10 mg total) by mouth every 6 (six) hours as needed for nausea or vomiting., Disp: 30 tablet, Rfl: 0 .  sulfamethoxazole-trimethoprim (BACTRIM DS,SEPTRA DS) 800-160 MG tablet, Take 1 tablet by mouth 2 (two) times daily., Disp: 20  tablet, Rfl: 0 .  timolol (TIMOPTIC) 0.5 % ophthalmic solution, Place 1 drop into the left eye every morning. , Disp: , Rfl: 11   Garner Nash, DO [facility] 10/17/2018 4:16 PM

## 2018-10-17 NOTE — Patient Instructions (Signed)
Thank you for visiting Dr. Valeta Harms at [facility]. Today we recommend the following:  Orders Placed This Encounter  Procedures  . Pulmonary Function Test   Return in about 2 weeks (around 10/31/2018) for with APP.    Please do your part to reduce the spread of COVID-19.

## 2018-11-02 MED FILL — HYDROCHLOROTHIAZIDE 50 MG T: 50 | 90 days supply | Qty: 45 | Fill #1

## 2019-01-29 MED FILL — HYDROCHLOROTHIAZIDE 50 MG T: 50 | 90 days supply | Qty: 45 | Fill #2

## 2019-04-08 DIAGNOSIS — H18422 Band keratopathy, left eye: Secondary | ICD-10-CM | POA: Diagnosis not present

## 2019-04-08 DIAGNOSIS — S0532XD Ocular laceration without prolapse or loss of intraocular tissue, left eye, subsequent encounter: Secondary | ICD-10-CM | POA: Diagnosis not present

## 2019-04-08 DIAGNOSIS — H33052 Total retinal detachment, left eye: Secondary | ICD-10-CM | POA: Diagnosis not present

## 2019-04-08 DIAGNOSIS — H4032X4 Glaucoma secondary to eye trauma, left eye, indeterminate stage: Secondary | ICD-10-CM | POA: Diagnosis not present

## 2019-04-26 MED FILL — TIMOLOL 0.5% EYE DROPS: 0.5 | 90 days supply | Qty: 10 | Fill #1

## 2019-04-26 MED FILL — HYDROCHLOROTHIAZIDE 50 MG T: 50 | 90 days supply | Qty: 45 | Fill #3

## 2019-07-31 MED FILL — HYDROCHLOROTHIAZIDE 50 MG T: 50 | 90 days supply | Qty: 45 | Fill #4

## 2019-10-30 DIAGNOSIS — Z85038 Personal history of other malignant neoplasm of large intestine: Secondary | ICD-10-CM | POA: Diagnosis not present

## 2019-10-31 DIAGNOSIS — I10 Essential (primary) hypertension: Secondary | ICD-10-CM | POA: Diagnosis not present

## 2019-10-31 DIAGNOSIS — R252 Cramp and spasm: Secondary | ICD-10-CM | POA: Diagnosis not present

## 2019-10-31 DIAGNOSIS — Z0000 Encounter for general adult medical examination without abnormal findings: Secondary | ICD-10-CM | POA: Diagnosis not present

## 2019-11-01 ENCOUNTER — Other Ambulatory Visit (HOSPITAL_COMMUNITY): Payer: Self-pay | Admitting: Family Medicine

## 2019-11-01 MED FILL — HYDROCHLOROTHIAZIDE 25 MG T: 25 | 90 days supply | Qty: 90 | Fill #0

## 2019-11-27 DIAGNOSIS — Z1159 Encounter for screening for other viral diseases: Secondary | ICD-10-CM | POA: Diagnosis not present

## 2019-11-27 MED FILL — PLENVU 140 GM SOLR: 140 | 1 days supply | Qty: 3 | Fill #0

## 2019-12-02 DIAGNOSIS — K5730 Diverticulosis of large intestine without perforation or abscess without bleeding: Secondary | ICD-10-CM | POA: Diagnosis not present

## 2019-12-02 DIAGNOSIS — Z85038 Personal history of other malignant neoplasm of large intestine: Secondary | ICD-10-CM | POA: Diagnosis not present

## 2020-01-15 ENCOUNTER — Other Ambulatory Visit (HOSPITAL_COMMUNITY): Payer: Self-pay

## 2020-01-15 MED FILL — TIMOLOL MALEATE 0.5 % SOLN: 0.5 | 90 days supply | Qty: 5 | Fill #0

## 2020-04-16 ENCOUNTER — Other Ambulatory Visit (HOSPITAL_COMMUNITY): Payer: Self-pay

## 2020-04-16 DIAGNOSIS — S0532XD Ocular laceration without prolapse or loss of intraocular tissue, left eye, subsequent encounter: Secondary | ICD-10-CM | POA: Diagnosis not present

## 2020-04-16 DIAGNOSIS — Q825 Congenital non-neoplastic nevus: Secondary | ICD-10-CM | POA: Diagnosis not present

## 2020-04-16 DIAGNOSIS — H33052 Total retinal detachment, left eye: Secondary | ICD-10-CM | POA: Diagnosis not present

## 2020-04-16 DIAGNOSIS — H4032X4 Glaucoma secondary to eye trauma, left eye, indeterminate stage: Secondary | ICD-10-CM | POA: Diagnosis not present

## 2020-04-16 DIAGNOSIS — H18422 Band keratopathy, left eye: Secondary | ICD-10-CM | POA: Diagnosis not present

## 2020-04-16 MED FILL — TIMOLOL MALEATE 0.5 % SOLN: 0.5 | 90 days supply | Qty: 5 | Fill #0

## 2020-05-03 MED FILL — HYDROCHLOROTHIAZIDE 25 MG T: 25 | 90 days supply | Qty: 90 | Fill #1

## 2020-05-18 DIAGNOSIS — S0532XD Ocular laceration without prolapse or loss of intraocular tissue, left eye, subsequent encounter: Secondary | ICD-10-CM | POA: Diagnosis not present

## 2020-05-18 DIAGNOSIS — H18422 Band keratopathy, left eye: Secondary | ICD-10-CM | POA: Diagnosis not present

## 2020-05-18 DIAGNOSIS — H4032X4 Glaucoma secondary to eye trauma, left eye, indeterminate stage: Secondary | ICD-10-CM | POA: Diagnosis not present

## 2020-09-29 ENCOUNTER — Other Ambulatory Visit (HOSPITAL_COMMUNITY): Payer: Self-pay

## 2020-09-29 MED FILL — Timolol Maleate Ophth Soln 0.5%: OPHTHALMIC | 90 days supply | Qty: 5 | Fill #0 | Status: AC

## 2020-11-03 DIAGNOSIS — Z0000 Encounter for general adult medical examination without abnormal findings: Secondary | ICD-10-CM | POA: Diagnosis not present

## 2020-11-03 DIAGNOSIS — Q825 Congenital non-neoplastic nevus: Secondary | ICD-10-CM | POA: Diagnosis not present

## 2020-11-03 DIAGNOSIS — H33052 Total retinal detachment, left eye: Secondary | ICD-10-CM | POA: Diagnosis not present

## 2020-11-03 DIAGNOSIS — H18422 Band keratopathy, left eye: Secondary | ICD-10-CM | POA: Diagnosis not present

## 2020-11-03 DIAGNOSIS — I10 Essential (primary) hypertension: Secondary | ICD-10-CM | POA: Diagnosis not present

## 2020-11-03 DIAGNOSIS — H4032X4 Glaucoma secondary to eye trauma, left eye, indeterminate stage: Secondary | ICD-10-CM | POA: Diagnosis not present

## 2020-11-03 DIAGNOSIS — Z125 Encounter for screening for malignant neoplasm of prostate: Secondary | ICD-10-CM | POA: Diagnosis not present

## 2020-11-03 DIAGNOSIS — C50929 Malignant neoplasm of unspecified site of unspecified male breast: Secondary | ICD-10-CM | POA: Diagnosis not present

## 2020-11-03 DIAGNOSIS — D649 Anemia, unspecified: Secondary | ICD-10-CM | POA: Diagnosis not present

## 2020-11-03 DIAGNOSIS — S0532XS Ocular laceration without prolapse or loss of intraocular tissue, left eye, sequela: Secondary | ICD-10-CM | POA: Diagnosis not present

## 2020-11-03 DIAGNOSIS — Z13220 Encounter for screening for lipoid disorders: Secondary | ICD-10-CM | POA: Diagnosis not present

## 2020-11-04 ENCOUNTER — Other Ambulatory Visit (HOSPITAL_COMMUNITY): Payer: Self-pay

## 2020-11-04 MED ORDER — TIMOLOL MALEATE 0.5 % OP SOLN
OPHTHALMIC | 11 refills | Status: DC
  Filled 2020-11-04: qty 10, 100d supply, fill #0
  Filled 2020-11-11: qty 5, 90d supply, fill #0

## 2020-11-04 MED ORDER — HYDROCHLOROTHIAZIDE 50 MG PO TABS
ORAL_TABLET | ORAL | 0 refills | Status: DC
  Filled 2020-11-04: qty 45, 90d supply, fill #0
  Filled 2021-03-04: qty 45, 90d supply, fill #1

## 2020-11-11 ENCOUNTER — Other Ambulatory Visit (HOSPITAL_COMMUNITY): Payer: Self-pay

## 2020-11-13 DIAGNOSIS — E876 Hypokalemia: Secondary | ICD-10-CM | POA: Diagnosis not present

## 2020-11-13 DIAGNOSIS — D649 Anemia, unspecified: Secondary | ICD-10-CM | POA: Diagnosis not present

## 2020-11-16 ENCOUNTER — Other Ambulatory Visit: Payer: Self-pay

## 2020-11-16 ENCOUNTER — Encounter (HOSPITAL_COMMUNITY): Payer: Self-pay

## 2020-11-16 ENCOUNTER — Ambulatory Visit (HOSPITAL_COMMUNITY)
Admission: EM | Admit: 2020-11-16 | Discharge: 2020-11-16 | Disposition: A | Discharge status: Home / Self Care | Payer: 59

## 2020-11-16 ENCOUNTER — Encounter (HOSPITAL_BASED_OUTPATIENT_CLINIC_OR_DEPARTMENT_OTHER): Payer: Self-pay | Admitting: *Deleted

## 2020-11-16 ENCOUNTER — Emergency Department (HOSPITAL_BASED_OUTPATIENT_CLINIC_OR_DEPARTMENT_OTHER): Payer: 59

## 2020-11-16 ENCOUNTER — Emergency Department (HOSPITAL_BASED_OUTPATIENT_CLINIC_OR_DEPARTMENT_OTHER)
Admission: EM | Admit: 2020-11-16 | Discharge: 2020-11-16 | Disposition: A | Discharge status: Home / Self Care | Payer: 59 | Attending: Emergency Medicine | Admitting: Emergency Medicine

## 2020-11-16 DIAGNOSIS — R1031 Right lower quadrant pain: Secondary | ICD-10-CM | POA: Insufficient documentation

## 2020-11-16 DIAGNOSIS — I10 Essential (primary) hypertension: Secondary | ICD-10-CM | POA: Insufficient documentation

## 2020-11-16 DIAGNOSIS — R109 Unspecified abdominal pain: Secondary | ICD-10-CM

## 2020-11-16 DIAGNOSIS — F17290 Nicotine dependence, other tobacco product, uncomplicated: Secondary | ICD-10-CM | POA: Insufficient documentation

## 2020-11-16 DIAGNOSIS — Z79899 Other long term (current) drug therapy: Secondary | ICD-10-CM | POA: Insufficient documentation

## 2020-11-16 DIAGNOSIS — Z85048 Personal history of other malignant neoplasm of rectum, rectosigmoid junction, and anus: Secondary | ICD-10-CM | POA: Diagnosis not present

## 2020-11-16 DIAGNOSIS — D72829 Elevated white blood cell count, unspecified: Secondary | ICD-10-CM | POA: Diagnosis not present

## 2020-11-16 LAB — CBC WITH DIFFERENTIAL/PLATELET
Abs Immature Granulocytes: 0.03 10*3/uL (ref 0.00–0.07)
Basophils Absolute: 0 10*3/uL (ref 0.0–0.1)
Basophils Relative: 0 %
Eosinophils Absolute: 0.1 10*3/uL (ref 0.0–0.5)
Eosinophils Relative: 1 %
HCT: 40.8 % (ref 39.0–52.0)
Hemoglobin: 13.9 g/dL (ref 13.0–17.0)
Immature Granulocytes: 0 %
Lymphocytes Relative: 15 %
Lymphs Abs: 1.7 10*3/uL (ref 0.7–4.0)
MCH: 28.8 pg (ref 26.0–34.0)
MCHC: 34.1 g/dL (ref 30.0–36.0)
MCV: 84.5 fL (ref 80.0–100.0)
Monocytes Absolute: 0.8 10*3/uL (ref 0.1–1.0)
Monocytes Relative: 8 %
Neutro Abs: 8.2 10*3/uL — ABNORMAL HIGH (ref 1.7–7.7)
Neutrophils Relative %: 76 %
Platelets: 207 10*3/uL (ref 150–400)
RBC: 4.83 MIL/uL (ref 4.22–5.81)
RDW: 13.7 % (ref 11.5–15.5)
WBC: 10.9 10*3/uL — ABNORMAL HIGH (ref 4.0–10.5)
nRBC: 0 % (ref 0.0–0.2)

## 2020-11-16 LAB — COMPREHENSIVE METABOLIC PANEL
ALT: 25 U/L (ref 0–44)
AST: 24 U/L (ref 15–41)
Albumin: 4.8 g/dL (ref 3.5–5.0)
Alkaline Phosphatase: 86 U/L (ref 38–126)
Anion gap: 8 (ref 5–15)
BUN: 17 mg/dL (ref 8–23)
CO2: 28 mmol/L (ref 22–32)
Calcium: 10.2 mg/dL (ref 8.9–10.3)
Chloride: 101 mmol/L (ref 98–111)
Creatinine, Ser: 1 mg/dL (ref 0.61–1.24)
GFR, Estimated: >60 mL/min (ref >60)
Glucose, Bld: 115 mg/dL — ABNORMAL HIGH (ref 70–99)
Potassium: 3.8 mmol/L (ref 3.5–5.1)
Sodium: 137 mmol/L (ref 135–145)
Total Bilirubin: 0.7 mg/dL (ref 0.3–1.2)
Total Protein: 7.9 g/dL (ref 6.5–8.1)

## 2020-11-16 LAB — URINALYSIS, ROUTINE W REFLEX MICROSCOPIC
Bilirubin Urine: NEGATIVE
Glucose, UA: NEGATIVE mg/dL
Hgb urine dipstick: NEGATIVE
Ketones, ur: NEGATIVE mg/dL
Leukocytes,Ua: NEGATIVE
Nitrite: NEGATIVE
Protein, ur: 30 mg/dL — AB
Specific Gravity, Urine: 1.027 (ref 1.005–1.030)
pH: 5.5 (ref 5.0–8.0)

## 2020-11-16 LAB — LIPASE, BLOOD: Lipase: 40 U/L (ref 11–51)

## 2020-11-16 MED ORDER — IOHEXOL 350 MG/ML SOLN
60.0000 mL | Freq: Once | INTRAVENOUS | Status: AC | PRN
  Administered 2020-11-16: 60 mL via INTRAVENOUS

## 2020-11-16 NOTE — ED Triage Notes (Signed)
Right lower quadrant pain for 3-4 days.  Denies n/v.

## 2020-11-16 NOTE — ED Triage Notes (Signed)
Pt presents with RLQ abdominal pain for past few days.

## 2020-11-16 NOTE — ED Notes (Signed)
Patient verbalizes understanding of discharge instructions. Opportunity for questioning and answers were provided. Patient discharged from ED.  °

## 2020-11-16 NOTE — ED Provider Notes (Signed)
[place]    CSN: [id number] Arrival date & time: 11/16/20  [id number]      History   Chief Complaint Chief Complaint  Patient presents with   Abdominal Pain    HPI [facility] is a 65 y.o. male.   Patient presents with right lower quadrant sharp abdominal pain for 4 days, worsening over the last 2 days in intensity and frequency.  Tender to palpation.  Over the last week has had increased bowel movements, occurring after each meal.  Described as formed and brown.  Denies fever, chills, nausea, vomiting, diarrhea, heartburn or indigestion, increased gas production, bloating, changes in diet, recent travel, urinary changes.  Last colonoscopy 6 months ago, endorses findings normal.  History of rectal cancer 2019, last seen by oncology 6 months ago, status stable.  No known sick contact.  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 at [facility] in [place]   08-18-2001   Wears glasses     Patient Active Problem List   Diagnosis Date Noted   Rectal adenocarcinoma ([place]) 09/28/2016    Past Surgical History:  Procedure Laterality Date   ARTHROSCOPIC REPAIR ACL Right 1998   EXPLORATION AND REPAIR LEFT EYE INJURY  08-18-2001      ruptured globe- repair corneal laceration, reposition of prolapsed uveal tissue   HEMORRHOID SURGERY N/A  09/01/2016   Procedure: HEMORRHOIDECTOMY;  Surgeon: Leighton Ruff, MD;  Location: [facility];  Service: General;  Laterality: N/A;   SUPERFICIAL KERATECTOMY Left 06-29-2011    [facility]    with EDTA scrub of left eye   TEE WITHOUT CARDIOVERSION  05-11-2009  dr Dorris Carnes   LVSEF 55-65%/  mild thickened AV without AI/  trace MR/ mixed fixed artherosclerosis plaqueing thoracic aorta/  no evidence thrombus/  by agitated saling and color doppler there was a PFO       Home Medications    Prior to Admission medications   Medication Sig Start Date End Date Taking? Authorizing Provider  aspirin 325 MG tablet Take 325 mg by mouth daily.    [provider]  Ferrous Sulfate (IRON) 325 (65 Fe) MG TABS Take 4 tablets by mouth every morning.     [provider]  hydrochlorothiazide (HYDRODIURIL) 25 MG tablet TAKE 1 TABLET BY MOUTH ONCE A DAY 11/01/19 10/31/20  Leonard Downing, MD  hydrochlorothiazide (HYDRODIURIL) 50 MG tablet Take 25 mg by mouth every morning.  10/27/14   [provider]  hydrochlorothiazide (HYDRODIURIL) 50 MG tablet Take 1/2 of a tablet by mouth every 24 hours for hypertension. 11/04/20     hypromellose (GENTEAL) 0.3 % GEL ophthalmic ointment Place 1 application into  the left eye as needed.  06/20/14   [provider]  Multiple Vitamin (MULTI-VITAMINS) TABS Take 1 tablet by mouth daily. Adult chewable    [provider]  Potassium 95 MG TABS Take 1 tablet by mouth every morning.    [provider]  prednisoLONE acetate (PRED FORTE) 1 % ophthalmic suspension Place 1 drop into the left eye as needed. 05/23/16   [provider]  prochlorperazine (COMPAZINE) 10 MG tablet Take 1 tablet (10 mg total) by mouth every 6 (six) hours as needed for nausea or vomiting. 10/10/16   Truitt Merle, MD  timolol (TIMOPTIC) 0.5 % ophthalmic solution Place 1 drop into the left eye every morning.  11/19/14   [provider]   timolol (TIMOPTIC) 0.5 % ophthalmic solution PLACE 1 DROP INTO THE LEFT EYE DAILY. 04/16/20 04/16/21    timolol (TIMOPTIC) 0.5 % ophthalmic solution PLACE 1 DROP INTO THE LEFT EYE DAILY. 01/15/20 01/14/21    timolol (TIMOPTIC) 0.5 % ophthalmic solution Place 1 drop into the left eye daily. 11/04/20       Family History Family History  Problem Relation Age of Onset   Colon cancer Mother     Social History Social History   Tobacco Use   Smoking status: Every Day    Years: 30.00    Types: Cigars, Cigarettes   Smokeless tobacco: Former    Types: Snuff    Quit date: 08/25/2008   Tobacco comments:    per pt currently smoke small cigar x2 daily (average) previous smoked cigeratte until 2010  Vaping Use   Vaping Use: Never used  Substance Use Topics   Alcohol use: Yes    Comment: occasionally     Allergies   Patient has no known allergies.   Review of Systems Review of Systems Defer to HPI    Physical Exam Triage Vital Signs ED Triage Vitals  Enc Vitals Group     BP 11/16/20 0825 (!) 135/94     Pulse Rate 11/16/20 0825 84     Resp 11/16/20 0825 18     Temp 11/16/20 0825 98.5 F (36.9 C)     Temp Source 11/16/20 0825 Oral     SpO2 11/16/20 0825 96 %     Weight --      Height --      Head Circumference --      Peak Flow --      Pain Score 11/16/20 0828 5     Pain Loc --      Pain Edu? --      Excl. in [place]? --    No data found.  Updated Vital Signs BP (!) 135/94 (BP Location: Left Arm)   Pulse 84   Temp 98.5 F (36.9 C) (Oral)   Resp 18   SpO2 96%   Visual Acuity Right Eye Distance:   Left Eye Distance:   Bilateral Distance:    Right Eye Near:   Left Eye Near:    Bilateral Near:     Physical Exam Constitutional:      Appearance: He is well-developed and normal weight.  HENT:     Head: Normocephalic.  Eyes:     Extraocular Movements: Extraocular movements intact.  Pulmonary:     Effort: Pulmonary effort is normal.  Abdominal:     General:  Abdomen is flat. Bowel sounds are normal.     Palpations: Abdomen is soft.     Tenderness: There is abdominal tenderness in the right  lower quadrant. There is no guarding. Negative signs include Murphy's sign.     Hernia: No hernia is present.  Skin:    General: Skin is warm and dry.  Neurological:     General: No focal deficit present.     Mental Status: He is alert and oriented to person, place, and time.  Psychiatric:        Mood and Affect: Mood normal.        Behavior: Behavior normal.     UC Treatments / Results  Labs (all labs ordered are listed, but only abnormal results are displayed) Labs Reviewed - No data to display  EKG   Radiology No results found.  Procedures Procedures (including critical care time)  Medications Ordered in UC Medications - No data to display  Initial Impression / Assessment and Plan / UC Course  I have reviewed the triage vital signs and the nursing notes.  Pertinent labs & imaging results that were available during my care of the patient were reviewed by me and considered in my medical decision making (see chart for details).  Lateral quadrant abdominal pain   based on symptomology and vital signs being stable I do not feel like this is due to an infectious process, however patient does have tenderness to the right lower quadrant on exam which does warrant follow-up however I do not feel like there is an emergent cause today, discussed with patient, discussed capabilities of urgent care only having x-rays for imaging, recommended follow-up with primary care doctor for further evaluation or recommended going to a med center that has CT imaging available, verbalized understanding, given strict return precautions for worsening abdominal pain to go to the nearest emergency department for evaluation Final Clinical Impressions(s) / UC Diagnoses   Final diagnoses:  Right lower quadrant abdominal pain     Discharge Instructions      While  not emergent I do feel that your abdominal pain needs some form of follow-up, I recommend setting an appointment with your primary care for evaluation or you may go to one of the med centers for imaging [facility] only has x-rays which will not show the details of your organs in your abdomen  At any point that your abdominal pain becomes severe please go to the nearest emergency department for further evaluation   ED Prescriptions   None    PDMP not reviewed this encounter.   Hans Eden, [place] 11/16/20 [phone]

## 2020-11-16 NOTE — Discharge Instructions (Addendum)
You have been seen and discharged from the emergency department.  Your blood work, urinalysis and CAT scan showed no acute finding today.  Constipation was seen on the CAT scan.  Use fiber/MiraLAX and over-the-counter medications as needed.  Stay well-hydrated with water.  Follow-up with your primary provider for reevaluation and further care. Take home medications as prescribed. If you have any worsening symptoms or further concerns for your health please return to an emergency department for further evaluation.

## 2020-11-16 NOTE — ED Provider Notes (Signed)
[place] EMERGENCY DEPT Provider Note   CSN: [id number] Arrival date & time: 11/16/20  0920     History Chief Complaint  Patient presents with   Abdominal Pain    J[facility] David is a 65 y.o. male.  HPI  65 year old male with past medical history of rectal adenocarcinoma status post removal/chemotherapy and radiation, left thigh traumatic glaucoma with blindness presents the emergency department right lower quadrant abdominal pain.  Patient states that this started about 4 days ago, has become more severe in intensity.  States it is a sharp pain in his right mid/lower abdomen.  He has had an episode of nausea/vomiting last week but denies any diarrhea.  No documented fever but endorses chills.  Denies any swelling/bulge in that area.  No radiation of pain into the testicle/testicular swelling, no hematuria/dysuria.  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 at [facility] in [place]   08-18-2001   Wears glasses     Patient Active Problem List   Diagnosis Date Noted   Rectal adenocarcinoma ([place]) 09/28/2016    Past Surgical History:  Procedure Laterality Date   ARTHROSCOPIC REPAIR ACL Right 1998   EXPLORATION AND REPAIR LEFT EYE INJURY  08-18-2001   [place]   ruptured globe- repair corneal laceration, reposition of prolapsed uveal tissue   HEMORRHOID SURGERY N/A 09/01/2016    Procedure: HEMORRHOIDECTOMY;  Surgeon: Leighton Ruff, MD;  Location: [place];  Service: General;  Laterality: N/A;   SUPERFICIAL KERATECTOMY Left 06-29-2011    [facility]    with EDTA scrub of left eye   TEE WITHOUT CARDIOVERSION  05-11-2009  dr Dorris Carnes   LVSEF 55-65%/  mild thickened AV without AI/  trace MR/ mixed fixed artherosclerosis plaqueing thoracic aorta/  no evidence thrombus/  by agitated saling and color doppler there was a PFO       Family History  Problem Relation Age of Onset   Colon cancer Mother     Social History   Tobacco Use   Smoking status: Every Day    Types: Cigars   Smokeless tobacco: Former    Quit date: 08/25/2008   Tobacco comments:    per pt currently smoke small cigar x2 daily (average) previous smoked cigeratte until 2010  Vaping Use   Vaping Use: Never used  Substance Use Topics   Alcohol use: Yes    Comment: occasionally   Drug use: Not Currently    Types: Marijuana    Home Medications Prior to Admission medications   Medication Sig Start Date End Date Taking? Authorizing Provider  Ferrous Sulfate (IRON) 325 (65 Fe) MG TABS Take 4 tablets by mouth every morning.    Yes [provider]  hydrochlorothiazide (HYDRODIURIL)  50 MG tablet Take 1/2 of a tablet by mouth every 24 hours for hypertension. 11/04/20  Yes   timolol (TIMOPTIC) 0.5 % ophthalmic solution Place 1 drop into the left eye every morning.  11/19/14  Yes [provider]    Allergies    Patient has no known allergies.  Review of Systems   Review of Systems  Constitutional:  Positive for appetite change and chills. Negative for fever.  HENT:  Negative for congestion.   Eyes:  Negative for visual disturbance.  Respiratory:  Negative for shortness of breath.   Cardiovascular:  Negative for chest pain.  Gastrointestinal:  Positive for abdominal pain, nausea and vomiting. Negative for diarrhea and rectal pain.  Genitourinary:  Negative for dysuria.   Skin:  Negative for rash.  Neurological:  Negative for headaches.   Physical Exam Updated Vital Signs BP (!) 157/94   Pulse 71   Temp 98.3 F (36.8 C) (Oral)   Resp 16   Ht '5\' 11"'$  (1.803 m)   Wt 61.2 kg   SpO2 95%   BMI 18.83 kg/m   Physical Exam Vitals and nursing note reviewed.  Constitutional:      Appearance: Normal appearance.  HENT:     Head: Normocephalic.     Mouth/Throat:     Mouth: Mucous membranes are moist.  Cardiovascular:     Rate and Rhythm: Normal rate.  Pulmonary:     Effort: Pulmonary effort is normal. No respiratory distress.  Abdominal:     General: There is no distension.     Palpations: Abdomen is soft. There is no mass.     Tenderness: There is abdominal tenderness in the right lower quadrant. There is no guarding.  Skin:    General: Skin is warm.  Neurological:     Mental Status: He is alert and oriented to person, place, and time. Mental status is at baseline.  Psychiatric:        Mood and Affect: Mood normal.    ED Results / Procedures / Treatments   Labs (all labs ordered are listed, but only abnormal results are displayed) Labs Reviewed  COMPREHENSIVE METABOLIC PANEL - Abnormal; Notable for the following components:      Result Value   Glucose, Bld 115 (*)    All other components within normal limits  CBC WITH DIFFERENTIAL/PLATELET - Abnormal; Notable for the following components:   WBC 10.9 (*)    Neutro Abs 8.2 (*)    All other components within normal limits  LIPASE, BLOOD  URINALYSIS, ROUTINE W REFLEX MICROSCOPIC    EKG None  Radiology No results found.  Procedures Procedures   Medications Ordered in ED Medications - No data to display  ED Course  I have reviewed the triage vital signs and the nursing notes.  Pertinent labs & imaging results that were available during my care of the patient were reviewed by me and considered in my medical decision making (see chart for details).    MDM Rules/Calculators/A&P                            65 year old male presents the emergency department with right-sided abdominal pain for the past 3 to 4 days.  Denies any nausea/vomiting/diarrhea or genitourinary symptoms.  Vitals are stable on arrival.  Abdominal exam is benign, no findings of hernia.  Blood work is reassuring, mild leukocytosis but no other acute findings.  CT of the abdomen pelvis shows retained  fecal material but no acute findings.  Appendix is not completely identified but there is no surrounding inflammatory changes in regards to an appendicitis.  This shows no UTI.  Plan for symptomatic and outpatient follow-up with strict return to ED precautions.  Patient at this time appears safe and stable for discharge and will be treated as an outpatient.  Discharge plan and strict return to ED precautions discussed, patient verbalizes understanding and agreement.  Final Clinical Impression(s) / ED Diagnoses Final diagnoses:  None    Rx / DC Orders ED Discharge Orders     None        Lorelle Gibbs, DO 11/16/20 1438

## 2020-11-16 NOTE — Discharge Instructions (Addendum)
While not emergent I do feel that your abdominal pain needs some form of follow-up, I recommend setting an appointment with your primary care for evaluation or you may go to one of the med centers for imaging [facility] only has x-rays which will not show the details of your organs in your abdomen  At any point that your abdominal pain becomes severe please go to the nearest emergency department for further evaluation

## 2020-12-01 ENCOUNTER — Ambulatory Visit: Payer: 59 | Admitting: Internal Medicine

## 2020-12-01 ENCOUNTER — Encounter: Payer: Self-pay | Admitting: Internal Medicine

## 2020-12-01 ENCOUNTER — Other Ambulatory Visit (HOSPITAL_COMMUNITY): Payer: Self-pay

## 2020-12-01 ENCOUNTER — Other Ambulatory Visit (INDEPENDENT_AMBULATORY_CARE_PROVIDER_SITE_OTHER): Payer: 59

## 2020-12-01 VITALS — BP 148/90 | HR 72 | Ht 70.5 in | Wt 123.8 lb

## 2020-12-01 DIAGNOSIS — R634 Abnormal weight loss: Secondary | ICD-10-CM | POA: Diagnosis not present

## 2020-12-01 DIAGNOSIS — C20 Malignant neoplasm of rectum: Secondary | ICD-10-CM | POA: Diagnosis not present

## 2020-12-01 DIAGNOSIS — R1013 Epigastric pain: Secondary | ICD-10-CM

## 2020-12-01 DIAGNOSIS — R194 Change in bowel habit: Secondary | ICD-10-CM

## 2020-12-01 LAB — CBC WITH DIFFERENTIAL/PLATELET
Basophils Absolute: 0.1 10*3/uL (ref 0.0–0.1)
Basophils Relative: 1 % (ref 0.0–3.0)
Eosinophils Absolute: 0.2 10*3/uL (ref 0.0–0.7)
Eosinophils Relative: 2.6 % (ref 0.0–5.0)
HCT: 35.5 % — ABNORMAL LOW (ref 39.0–52.0)
Hemoglobin: 11.8 g/dL — ABNORMAL LOW (ref 13.0–17.0)
Lymphocytes Relative: 16.2 % (ref 12.0–46.0)
Lymphs Abs: 1.1 10*3/uL (ref 0.7–4.0)
MCHC: 33.3 g/dL (ref 30.0–36.0)
MCV: 87.8 fl (ref 78.0–100.0)
Monocytes Absolute: 0.6 10*3/uL (ref 0.1–1.0)
Monocytes Relative: 9.3 % (ref 3.0–12.0)
Neutro Abs: 4.6 10*3/uL (ref 1.4–7.7)
Neutrophils Relative %: 70.9 % (ref 43.0–77.0)
Platelets: 447 10*3/uL — ABNORMAL HIGH (ref 150.0–400.0)
RBC: 4.04 Mil/uL — ABNORMAL LOW (ref 4.22–5.81)
RDW: 13.8 % (ref 11.5–15.5)
WBC: 6.5 10*3/uL (ref 4.0–10.5)

## 2020-12-01 LAB — COMPREHENSIVE METABOLIC PANEL
ALT: 14 U/L (ref 0–53)
AST: 15 U/L (ref 0–37)
Albumin: 4 g/dL (ref 3.5–5.2)
Alkaline Phosphatase: 81 U/L (ref 39–117)
BUN: 12 mg/dL (ref 6–23)
CO2: 26 mEq/L (ref 19–32)
Calcium: 9.7 mg/dL (ref 8.4–10.5)
Chloride: 99 mEq/L (ref 96–112)
Creatinine, Ser: 0.86 mg/dL (ref 0.40–1.50)
GFR: 91.4 mL/min (ref >60.00)
Glucose, Bld: 91 mg/dL (ref 70–99)
Potassium: 4.1 mEq/L (ref 3.5–5.1)
Sodium: 133 mEq/L — ABNORMAL LOW (ref 135–145)
Total Bilirubin: 0.4 mg/dL (ref 0.2–1.2)
Total Protein: 7.3 g/dL (ref 6.0–8.3)

## 2020-12-01 LAB — TSH: TSH: 1.07 u[IU]/mL (ref 0.35–5.50)

## 2020-12-01 MED ORDER — DICYCLOMINE HCL 20 MG PO TABS
20.0000 mg | ORAL_TABLET | Freq: Three times a day (TID) | ORAL | 0 refills | Status: DC | PRN
  Filled 2020-12-01: qty 90, 30d supply, fill #0

## 2020-12-01 NOTE — Patient Instructions (Addendum)
You have been scheduled for an endoscopy and colonoscopy. Please follow the written instructions given to you at your visit today. Please pick up your prep supplies at the pharmacy within the next 1-3 days. If you use inhalers (even only as needed), please bring them with you on the day of your procedure.   Your provider has requested that you go to the basement level for lab work before leaving today. Press "B" on the elevator. The lab is located at the first door on the left as you exit the elevator.  Due to recent changes in healthcare laws, you may see the results of your imaging and laboratory studies on MyChart before your provider has had a chance to review them.  We understand that in some cases there may be results that are confusing or concerning to you. Not all laboratory results come back in the same time frame and the provider may be waiting for multiple results in order to interpret others.  Please give [place] 48 hours in order for your provider to thoroughly review all the results before contacting the office for clarification of your results.   Stop your iron per Dr Carlean Purl. Also stop your Miralax, you may go back on it if constipated.  We have sent the following medications to your pharmacy for you to pick up at your convenience: Dicyclomine   I appreciate the opportunity to care for you. Silvano Rusk, MD, [facility]

## 2020-12-01 NOTE — Progress Notes (Signed)
Juan David 65 y.o. Nov 04, 1955 [id number]  Assessment & Plan:   Encounter Diagnoses  Name Primary?   Abdominal pain, epigastric Yes   Change in bowel habit    Loss of weight    Rectal adenocarcinoma (HCC) - history of    The cause of these problems is not clear at this time though the objective data today is reassuring.  I plan for an EGD and a colonoscopy.  I have asked him to stop his MiraLAX perhaps that is causing these loose stools and with a normal CBC and MCV he can stop his iron plus that is clouding the picture as well with the color of the stools and will need to be stopped before colonoscopy to allow for a better prep.  Further plans pending these results.  In the meantime a trial of dicyclomine.  Continue PPI.  Note I felt a transient pea-sized nodule in the right anterior aspect of the rectum question adherent stool.  This will be sorted out a colonoscopy.  Pending these results he really should reestablish follow-up care in the medical oncology clinic.  Meds ordered this encounter  Medications   dicyclomine (BENTYL) 20 MG tablet    Sig: Take 1 tablet (20 mg total) by mouth 3 (three) times daily as needed for spasms (before meals).    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  He actually went to the hospital and was evaluated in the emergency department for right lower quadrant pain on September 12 where CT of the abdomen pelvis with contrast was unrevealing.  I have reviewed that.  Did have hepatic and renal  cysts.  Labs were normal as well.  The ER physician thought maybe he was constipated recommended MiraLAX and that helped initially but then he is slipped into a cycle of postprandial epigastric pain and urgent defecation with loose stools.  Currently continuing to take MiraLAX every other day.  Saw primary care for these complaints of epigastric pain and black stools and was placed on a PPI and had an increase to twice daily.  GI history pertinent for rectal cancer.  He was seen at an urgent care and they thought he had a mass in the rectum, was seen by Dr. Marcello Moores of colorectal surgery and the patient's wife informs me that Dr. Marcello Moores thought that it was not a mass but was a hemorrhoid.  At surgery was found to have an anal canal mass that was biopsied and then subsequently treated with radiation and chemotherapy.  He had been followed by Dr. Lisbeth Renshaw and Dr. Burr Medico but has not been seen in oncology since 2019.  He had a colonoscopy by Dr. Penelope Coop before he retired in 2021, I can see it was done in August of that year and apparently he had a polyp.  I do not have that pathology or that report.   Wt Readings from Last 3 Encounters:  12/01/20 123 lb 12.8 oz (56.2 kg)  11/16/20 135 lb (61.2 kg)  10/17/18 127 lb (57.6 kg)  IMPRESSION: Hepatic and renal cysts.   The appendix is not well visualized although no inflammatory changes to suggest appendicitis are seen.   No acute abnormality is identified.   Aortic Atherosclerosis (ICD10-I70.0).     Electronically Signed   By: Inez Catalina M.D.   On: 11/16/2020 11:54    No Known Allergies Current Meds  Medication Sig   dicyclomine (BENTYL) 20 MG tablet Take 1 tablet (20 mg total) by mouth 3 (three) times daily as needed for spasms (before meals).   hydrochlorothiazide (HYDRODIURIL) 50 MG tablet Take 1/2 of a tablet by mouth every 24 hours for hypertension.   omeprazole (PRILOSEC) 40 MG capsule Take 40 mg by mouth daily.   timolol (TIMOPTIC) 0.5  % ophthalmic solution Place 1 drop into the left eye every morning.    [DISCONTINUED] Ferrous Sulfate (IRON) 325 (65 Fe) MG TABS Take 4 tablets by mouth every morning.    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   Hypertension    Iron deficiency    Legally blind in left eye, as defined in [place]    per pt only see light   PFO (patent foramen ovale)    per TEE done 05-11-2009    Rectal adenocarcinoma ([place]) 09/28/2016   Traumatic glaucoma, 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 at [facility] in [place]   08-18-2001   Wears glasses    Past Surgical History:  Procedure Laterality Date   ARTHROSCOPIC REPAIR ACL Right [employer] LEFT EYE INJURY  08-18-2001   [place] [facility]   ruptured globe- repair corneal laceration, reposition of prolapsed uveal tissue   HEMORRHOID SURGERY N/A 09/01/2016   Procedure: HEMORRHOIDECTOMY;  Surgeon: Leighton Ruff, MD;  Location: [facility];  Service: General;  Laterality: N/A;   SUPERFICIAL KERATECTOMY Left 06-29-2011    [facility]    with EDTA scrub of left eye   TEE WITHOUT CARDIOVERSION  05-11-2009  dr Dorris Carnes   LVSEF 55-65%/  mild thickened AV without AI/  trace MR/ mixed fixed artherosclerosis plaqueing thoracic aorta/  no evidence thrombus/  by agitated saling and color doppler there was a PFO   Social History   Social History Narrative   The patient is married no children   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   Review of Systems  Otherwise negative or as per HPI Objective:   Physical Exam BP (!) 148/90   Pulse 72   Ht 5' 10.5" (1.791 m)   Wt 123 lb 12.8 oz (56.2 kg)   BMI 17.51 kg/m  Thin BM NAD The left cornea is very cloudy Lungs coarse BS Ht s1s2 no rmg Abd thin soft NT, + BS, no bruits no HSM/mass and nontender Rectal - no mass, iron-clored stool - RA pea-sized nodule transient ? Adherent stool  Neuro alert and oriented x 3   Data reviewed include recent primary care notes, the emergency department notes and labs and imaging studies including personal review of the CT images.

## 2020-12-03 ENCOUNTER — Encounter (HOSPITAL_BASED_OUTPATIENT_CLINIC_OR_DEPARTMENT_OTHER): Payer: Self-pay

## 2020-12-03 ENCOUNTER — Emergency Department (HOSPITAL_BASED_OUTPATIENT_CLINIC_OR_DEPARTMENT_OTHER): Payer: 59

## 2020-12-03 ENCOUNTER — Telehealth: Payer: Self-pay | Admitting: Gastroenterology

## 2020-12-03 ENCOUNTER — Other Ambulatory Visit: Payer: Self-pay

## 2020-12-03 ENCOUNTER — Inpatient Hospital Stay (HOSPITAL_BASED_OUTPATIENT_CLINIC_OR_DEPARTMENT_OTHER)
Admission: EM | Admit: 2020-12-03 | Discharge: 2020-12-21 | DRG: 329 | Disposition: A | Discharge status: Home / Self Care | Payer: 59 | Attending: Surgery | Admitting: Surgery

## 2020-12-03 DIAGNOSIS — K9189 Other postprocedural complications and disorders of digestive system: Secondary | ICD-10-CM | POA: Diagnosis not present

## 2020-12-03 DIAGNOSIS — E43 Unspecified severe protein-calorie malnutrition: Secondary | ICD-10-CM | POA: Insufficient documentation

## 2020-12-03 DIAGNOSIS — B999 Unspecified infectious disease: Secondary | ICD-10-CM

## 2020-12-03 DIAGNOSIS — R059 Cough, unspecified: Secondary | ICD-10-CM | POA: Diagnosis not present

## 2020-12-03 DIAGNOSIS — H548 Legal blindness, as defined in USA: Secondary | ICD-10-CM | POA: Diagnosis not present

## 2020-12-03 DIAGNOSIS — K631 Perforation of intestine (nontraumatic): Principal | ICD-10-CM | POA: Diagnosis present

## 2020-12-03 DIAGNOSIS — H4032X0 Glaucoma secondary to eye trauma, left eye, stage unspecified: Secondary | ICD-10-CM | POA: Diagnosis not present

## 2020-12-03 DIAGNOSIS — Z681 Body mass index (BMI) 19 or less, adult: Secondary | ICD-10-CM | POA: Diagnosis not present

## 2020-12-03 DIAGNOSIS — R109 Unspecified abdominal pain: Secondary | ICD-10-CM | POA: Diagnosis not present

## 2020-12-03 DIAGNOSIS — T8149XA Infection following a procedure, other surgical site, initial encounter: Secondary | ICD-10-CM

## 2020-12-03 DIAGNOSIS — Z9889 Other specified postprocedural states: Secondary | ICD-10-CM

## 2020-12-03 DIAGNOSIS — M25512 Pain in left shoulder: Secondary | ICD-10-CM | POA: Diagnosis not present

## 2020-12-03 DIAGNOSIS — K251 Acute gastric ulcer with perforation: Secondary | ICD-10-CM | POA: Diagnosis not present

## 2020-12-03 DIAGNOSIS — K659 Peritonitis, unspecified: Secondary | ICD-10-CM

## 2020-12-03 DIAGNOSIS — R634 Abnormal weight loss: Secondary | ICD-10-CM | POA: Diagnosis not present

## 2020-12-03 DIAGNOSIS — Z20822 Contact with and (suspected) exposure to covid-19: Secondary | ICD-10-CM | POA: Diagnosis present

## 2020-12-03 DIAGNOSIS — I10 Essential (primary) hypertension: Secondary | ICD-10-CM | POA: Diagnosis not present

## 2020-12-03 DIAGNOSIS — K651 Peritoneal abscess: Secondary | ICD-10-CM | POA: Diagnosis not present

## 2020-12-03 DIAGNOSIS — E440 Moderate protein-calorie malnutrition: Secondary | ICD-10-CM | POA: Insufficient documentation

## 2020-12-03 DIAGNOSIS — Z85048 Personal history of other malignant neoplasm of rectum, rectosigmoid junction, and anus: Secondary | ICD-10-CM | POA: Diagnosis not present

## 2020-12-03 DIAGNOSIS — K275 Chronic or unspecified peptic ulcer, site unspecified, with perforation: Secondary | ICD-10-CM

## 2020-12-03 DIAGNOSIS — D62 Acute posthemorrhagic anemia: Secondary | ICD-10-CM | POA: Diagnosis not present

## 2020-12-03 DIAGNOSIS — K567 Ileus, unspecified: Secondary | ICD-10-CM | POA: Diagnosis not present

## 2020-12-03 DIAGNOSIS — K56609 Unspecified intestinal obstruction, unspecified as to partial versus complete obstruction: Secondary | ICD-10-CM | POA: Diagnosis present

## 2020-12-03 DIAGNOSIS — Z79899 Other long term (current) drug therapy: Secondary | ICD-10-CM | POA: Diagnosis not present

## 2020-12-03 DIAGNOSIS — Z800 Family history of malignant neoplasm of digestive organs: Secondary | ICD-10-CM | POA: Diagnosis not present

## 2020-12-03 DIAGNOSIS — E119 Type 2 diabetes mellitus without complications: Secondary | ICD-10-CM | POA: Diagnosis not present

## 2020-12-03 DIAGNOSIS — R52 Pain, unspecified: Secondary | ICD-10-CM

## 2020-12-03 DIAGNOSIS — Z23 Encounter for immunization: Secondary | ICD-10-CM

## 2020-12-03 DIAGNOSIS — Z86718 Personal history of other venous thrombosis and embolism: Secondary | ICD-10-CM

## 2020-12-03 DIAGNOSIS — F17290 Nicotine dependence, other tobacco product, uncomplicated: Secondary | ICD-10-CM | POA: Diagnosis not present

## 2020-12-03 DIAGNOSIS — B9620 Unspecified Escherichia coli [E. coli] as the cause of diseases classified elsewhere: Secondary | ICD-10-CM | POA: Diagnosis not present

## 2020-12-03 LAB — URINALYSIS, ROUTINE W REFLEX MICROSCOPIC
Bilirubin Urine: NEGATIVE
Glucose, UA: NEGATIVE mg/dL
Hgb urine dipstick: NEGATIVE
Ketones, ur: NEGATIVE mg/dL
Leukocytes,Ua: NEGATIVE
Nitrite: NEGATIVE
Specific Gravity, Urine: 1.023 (ref 1.005–1.030)
pH: 6.5 (ref 5.0–8.0)

## 2020-12-03 LAB — CBC WITH DIFFERENTIAL/PLATELET
Abs Immature Granulocytes: 0.05 10*3/uL (ref 0.00–0.07)
Basophils Absolute: 0 10*3/uL (ref 0.0–0.1)
Basophils Relative: 0 %
Eosinophils Absolute: 0.1 10*3/uL (ref 0.0–0.5)
Eosinophils Relative: 1 %
HCT: 36.3 % — ABNORMAL LOW (ref 39.0–52.0)
Hemoglobin: 12 g/dL — ABNORMAL LOW (ref 13.0–17.0)
Immature Granulocytes: 1 %
Lymphocytes Relative: 6 %
Lymphs Abs: 0.7 10*3/uL (ref 0.7–4.0)
MCH: 28.5 pg (ref 26.0–34.0)
MCHC: 33.1 g/dL (ref 30.0–36.0)
MCV: 86.2 fL (ref 80.0–100.0)
Monocytes Absolute: 0.6 10*3/uL (ref 0.1–1.0)
Monocytes Relative: 5 %
Neutro Abs: 9.4 10*3/uL — ABNORMAL HIGH (ref 1.7–7.7)
Neutrophils Relative %: 87 %
Platelets: 417 10*3/uL — ABNORMAL HIGH (ref 150–400)
RBC: 4.21 MIL/uL — ABNORMAL LOW (ref 4.22–5.81)
RDW: 14 % (ref 11.5–15.5)
WBC: 10.8 10*3/uL — ABNORMAL HIGH (ref 4.0–10.5)
nRBC: 0 % (ref 0.0–0.2)

## 2020-12-03 LAB — COMPREHENSIVE METABOLIC PANEL
ALT: 13 U/L (ref 0–44)
AST: 15 U/L (ref 15–41)
Albumin: 3.8 g/dL (ref 3.5–5.0)
Alkaline Phosphatase: 78 U/L (ref 38–126)
Anion gap: 8 (ref 5–15)
BUN: 11 mg/dL (ref 8–23)
CO2: 27 mmol/L (ref 22–32)
Calcium: 9.5 mg/dL (ref 8.9–10.3)
Chloride: 101 mmol/L (ref 98–111)
Creatinine, Ser: 0.83 mg/dL (ref 0.61–1.24)
GFR, Estimated: >60 mL/min (ref >60)
Glucose, Bld: 99 mg/dL (ref 70–99)
Potassium: 3.8 mmol/L (ref 3.5–5.1)
Sodium: 136 mmol/L (ref 135–145)
Total Bilirubin: 0.3 mg/dL (ref 0.3–1.2)
Total Protein: 6.7 g/dL (ref 6.5–8.1)

## 2020-12-03 LAB — RESP PANEL BY RT-PCR (FLU A&B, COVID) ARPGX2
Influenza A by PCR: NEGATIVE
Influenza B by PCR: NEGATIVE
SARS Coronavirus 2 by RT PCR: NEGATIVE

## 2020-12-03 LAB — LIPASE, BLOOD: Lipase: 17 U/L (ref 11–51)

## 2020-12-03 MED ORDER — PANTOPRAZOLE SODIUM 40 MG IV SOLR
40.0000 mg | Freq: Once | INTRAVENOUS | Status: AC
  Administered 2020-12-03: 40 mg via INTRAVENOUS
  Filled 2020-12-03: qty 40

## 2020-12-03 MED ORDER — LACTATED RINGERS IV BOLUS
1000.0000 mL | Freq: Once | INTRAVENOUS | Status: AC
  Administered 2020-12-04: 1000 mL via INTRAVENOUS

## 2020-12-03 MED ORDER — ONDANSETRON HCL 4 MG/2ML IJ SOLN
4.0000 mg | Freq: Once | INTRAMUSCULAR | Status: AC
  Administered 2020-12-03: 4 mg via INTRAVENOUS
  Filled 2020-12-03: qty 2

## 2020-12-03 MED ORDER — PIPERACILLIN-TAZOBACTAM 3.375 G IVPB 30 MIN
3.3750 g | Freq: Once | INTRAVENOUS | Status: AC
  Administered 2020-12-03: 3.375 g via INTRAVENOUS
  Filled 2020-12-03: qty 50

## 2020-12-03 MED ORDER — IOHEXOL 350 MG/ML SOLN
100.0000 mL | Freq: Once | INTRAVENOUS | Status: AC | PRN
  Administered 2020-12-03: 60 mL via INTRAVENOUS

## 2020-12-03 MED ORDER — HYDROMORPHONE HCL 1 MG/ML IJ SOLN
1.0000 mg | Freq: Once | INTRAMUSCULAR | Status: AC
Start: 2020-12-03 — End: 2020-12-03
  Administered 2020-12-03: 1 mg via INTRAVENOUS
  Filled 2020-12-03: qty 1

## 2020-12-03 NOTE — ED Triage Notes (Signed)
Patient here POV from Home with Nausea, ABD Pain for approximately 1 month.   Patient was here on 9/12 for Same Symptoms. Pain is mainly located in the Epigastric Region but radiates throughout entire ABD.   Severe Nausea with no Emesis. BIB Wheelchair. A&Ox4. GCS 15. Temp of 102 at Home today.

## 2020-12-03 NOTE — Telephone Encounter (Signed)
Pt's wife called  Pt with "severe" abdominal pain with cramps, nausea Lower abdominal burning sensation. No dysuria Fever 102 Not feeling well  Denies having any sore throat or flulike symptoms.  I have reviewed recent CT AP report, recent labs, recent OV (Dr. Celesta Aver note)   Plan: -I have instructed patient's wife to take him to ED @ [facility] (as wait time will be less) for emergent eval. -He is scheduled to have EGD/colon next week.  If patient feels better, is neg for COVID in ED, then can proceed. Will keep him on schedule for now.  I will let Dr. Carlean Purl know.   RG

## 2020-12-03 NOTE — ED Provider Notes (Signed)
[facility] EMERGENCY DEPT Provider Note   CSN: [id number] Arrival date & time: 12/03/20  1909     History Chief Complaint  Patient presents with   Nausea   Abdominal Pain    Juan David is a 65 y.o. male.  65 year old male who presents emerged from today with worsening abdominal pain.  Patient was seen a couple weeks ago and was treated for indigestion/ulcer.  He was doing okay saw Dr. Carlean Purl couple days ago and was plan for endoscopy next week.  Patient has been pretty stable but then today had acute worsening of his pain.  He brought him here for further evaluation.  States on the way here his stomach it hurt a lot worse noted he had a bump or when he would walk.  Has nausea but no vomiting.  Has had 14 pounds of weight loss in the last 13 days secondary to decreased appetite probably related to the pain.   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   Hypertension    Iron deficiency    Legally blind in left eye, as defined in [place]    per pt only see light   PFO (patent foramen ovale)    per TEE done 05-11-2009    Rectal adenocarcinoma ([place]) 09/28/2016   Traumatic glaucoma, 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 at [facility] in [place]   08-18-2001   Wears glasses     Patient Active Problem List   Diagnosis Date Noted   Rectal adenocarcinoma ([place]) 09/28/2016    Past Surgical History:  Procedure Laterality Date   ARTHROSCOPIC REPAIR ACL Right 1998   EXPLORATION AND REPAIR LEFT EYE INJURY  08-18-2001      ruptured globe- repair corneal laceration, reposition of  prolapsed uveal tissue   HEMORRHOID SURGERY N/A 09/01/2016   Procedure: HEMORRHOIDECTOMY;  Surgeon: Leighton Ruff, MD;  Location: [facility];  Service: General;  Laterality: N/A;   SUPERFICIAL KERATECTOMY Left 06-29-2011    [facility]    with EDTA scrub of left eye   TEE WITHOUT CARDIOVERSION  05-11-2009  dr Dorris Carnes   LVSEF 55-65%/  mild thickened AV without AI/  trace MR/ mixed fixed artherosclerosis plaqueing thoracic aorta/  no evidence thrombus/  by agitated saling and color doppler there was a PFO       Family History  Problem Relation Age of Onset   Colon cancer Mother     Social History   Tobacco Use   Smoking status: Every Day    Types: Cigars   Smokeless tobacco: Former    Quit date: 08/25/2008   Tobacco comments:    per pt currently smoke small cigar x2 daily (average) previous smoked cigeratte until 2010  Vaping Use   Vaping Use: Never used  Substance Use Topics   Alcohol use: Yes    Comment: occasionally   Drug use: Not Currently    Types: Marijuana    Home Medications Prior to Admission medications   Medication  Sig Start Date End Date Taking? Authorizing Provider  dicyclomine (BENTYL) 20 MG tablet Take 1 tablet (20 mg total) by mouth 3 (three) times daily as needed for spasms (before meals). 12/01/20   Gatha Mayer, MD  hydrochlorothiazide (HYDRODIURIL) 50 MG tablet Take 1/2 of a tablet by mouth every 24 hours for hypertension. 11/04/20     omeprazole (PRILOSEC) 40 MG capsule Take 40 mg by mouth daily.    [provider]  timolol (TIMOPTIC) 0.5 % ophthalmic solution Place 1 drop into the left eye every morning.  11/19/14   [provider]    Allergies    Patient has no known allergies.  Review of Systems   Review of Systems  Gastrointestinal:  Positive for abdominal pain.  All other systems reviewed and are negative.  Physical Exam Updated Vital Signs BP 128/78   Pulse 92   Temp 99.3 F (37.4 C)   Resp 18   SpO2 93%    Physical Exam Vitals and nursing note reviewed.  Constitutional:      Appearance: He is well-developed.  HENT:     Head: Normocephalic and atraumatic.  Cardiovascular:     Rate and Rhythm: Normal rate.  Pulmonary:     Effort: Pulmonary effort is normal. No respiratory distress.  Abdominal:     General: There is no distension.     Tenderness: There is generalized abdominal tenderness. There is guarding. There is no rebound.     Hernia: No hernia is present.  Musculoskeletal:        General: Normal range of motion.     Cervical back: Normal range of motion.  Skin:    General: Skin is warm and dry.  Neurological:     Mental Status: He is alert.    ED Results / Procedures / Treatments   Labs (all labs ordered are listed, but only abnormal results are displayed) Labs Reviewed  CBC WITH DIFFERENTIAL/PLATELET - Abnormal; Notable for the following components:      Result Value   WBC 10.8 (*)    RBC 4.21 (*)    Hemoglobin 12.0 (*)    HCT 36.3 (*)    Platelets 417 (*)    Neutro Abs 9.4 (*)    All other components within normal limits  URINALYSIS, ROUTINE W REFLEX MICROSCOPIC - Abnormal; Notable for the following components:   Protein, ur TRACE (*)    All other components within normal limits  RESP PANEL BY RT-PCR (FLU A&B, COVID) ARPGX2  COMPREHENSIVE METABOLIC PANEL  LIPASE, BLOOD  LACTIC ACID, PLASMA  LACTIC ACID, PLASMA    EKG None  Radiology CT ABDOMEN PELVIS W CONTRAST  Result Date: 12/03/2020 CLINICAL DATA:  Abdominal pain, fever, nausea, unspecified abdominal pain. EXAM: CT ABDOMEN AND PELVIS WITH CONTRAST TECHNIQUE: Multidetector CT imaging of the abdomen and pelvis was performed using the standard protocol following bolus administration of intravenous contrast. CONTRAST:  52mL OMNIPAQUE IOHEXOL 350 MG/ML SOLN COMPARISON:  11/16/2020 FINDINGS: Lower chest: No acute abnormality. Hepatobiliary: Punctate foci of free intraperitoneal gas are seen tracking within the  biliary hilum. Simple cyst noted within the right hepatic dome. The liver is otherwise unremarkable. Gallbladder unremarkable. No intra or extrahepatic biliary ductal dilation. Pancreas: Unremarkable Spleen: Unremarkable Adrenals/Urinary Tract: The adrenal glands are unremarkable. The kidneys are normal in size and position. Scattered hypodensities are seen which are too small to accurately characterize on this examination but are stable since prior examination and may simply represent tiny cortical cysts are again  noted. The kidneys are otherwise unremarkable. The bladder is unremarkable. Stomach/Bowel: Since the prior examination there has developed free intraperitoneal gas with punctate foci of gas seen within the perihepatic and right subdiaphragmatic region as well as tracking within the biliary hilum. The exact source of visceral perforation is not clearly identified on this examination, however, the location favors a proximal enteric source as can be seen with gastric or duodenal ulceration. There is mild free fluid within the abdomen. Mild sigmoid diverticulosis noted. The stomach, small bowel, and large bowel are otherwise unremarkable. The appendix is not clearly identified. No evidence of obstruction. Vascular/Lymphatic: Extensive aortoiliac atherosclerotic calcification. No aortic aneurysm. No pathologic adenopathy within the abdomen and pelvis. Reproductive: Unremarkable Other: No abdominal wall hernia. Musculoskeletal: No acute bone abnormality. No lytic or blastic bone lesion. IMPRESSION: Interval development of mild ascites and punctate foci of extraluminal gas within the right upper quadrant and biliary hilum. While the exact site of enteric perforation is not identified, a proximal source as can be seen with gastric or duodenal ulceration is suspected given the location of extraluminal gas. Aortic Atherosclerosis (ICD10-I70.0). Electronically Signed   By: Fidela Salisbury M.D.   On: 12/03/2020 22:10     Procedures .Critical Care Performed by: Merrily Pew, MD Authorized by: Merrily Pew, MD   Critical care provider statement:    Critical care time (minutes):  45   Critical care was necessary to treat or prevent imminent or life-threatening deterioration of the following conditions:  Sepsis   Critical care was time spent personally by me on the following activities:  Discussions with consultants, evaluation of patient's response to treatment, examination of patient, ordering and performing treatments and interventions, ordering and review of laboratory studies, ordering and review of radiographic studies, pulse oximetry, re-evaluation of patient's condition, obtaining history from patient or surrogate and review of old charts   Medications Ordered in ED Medications  iohexol (OMNIPAQUE) 350 MG/ML injection 100 mL (60 mLs Intravenous Contrast Given 12/03/20 2151)  pantoprazole (PROTONIX) injection 40 mg (40 mg Intravenous Given 12/03/20 2318)  piperacillin-tazobactam (ZOSYN) IVPB 3.375 g (0 g Intravenous Stopped 12/04/20 0002)  lactated ringers bolus 1,000 mL (1,000 mLs Intravenous New Bag/Given 12/04/20 0002)  HYDROmorphone (DILAUDID) injection 1 mg (1 mg Intravenous Given 12/03/20 2334)  ondansetron (ZOFRAN) injection 4 mg (4 mg Intravenous Given 12/03/20 2334)    ED Course  I have reviewed the triage vital signs and the nursing notes.  Pertinent labs & imaging results that were available during my care of the patient were reviewed by me and considered in my medical decision making (see chart for details).    MDM Rules/Calculators/A&P                         CT with evidence of perforated ulcer likely.  Small amount he has been does have pretty significant tenderness to his abdomen and it does seem to have some mild peritonitis.  Antibiotic started, pain nausea medicine.  Patient made NPO.  Will discuss with surgery prior to consult and gastroenterology and medicine.  Discussed with Dr.  Zenia Resides via scrub nurse as Dr. Zenia Resides was in the operating room.  Surgery recommends ED to ED transfer and they will consult on the emergency room. D/w physicians there, transfer pending.   Final Clinical Impression(s) / ED Diagnoses Final diagnoses:  Perforated ulcer ([place])  Peritonitis ([place])    Rx / DC Orders ED Discharge Orders     None  Naquita Nappier, Corene Cornea, MD 12/04/20 [phone]

## 2020-12-03 NOTE — ED Notes (Signed)
RT educated pt on smoking cessation. Pt acknowledges he needs to quit and is in the process of doing so.

## 2020-12-03 NOTE — ED Provider Notes (Signed)
Emergency Medicine Provider Triage Evaluation Note  Juan David , a 65 y.o. male  was evaluated in triage.  Pt complains of abd pain x1 month and fever.  Review of Systems  Positive: Abd pain, fever Negative: Constipation, diarrhea  Physical Exam  BP 136/86 (BP Location: Right Arm)   Pulse (!) 101   Temp 99.3 F (37.4 C)   Resp 15   SpO2 98%  Gen:   Awake, no distress   Resp:  Normal effort  MSK:   Moves extremities without difficulty  Other:  Diffuse abd ttp  Medical Decision Making  Medically screening exam initiated at 8:04 PM.  Appropriate orders placed.  Juan David was informed that the remainder of the evaluation will be completed by another provider, this initial triage assessment does not replace that evaluation, and the importance of remaining in the ED until their evaluation is complete.     Bishop Dublin 12/03/20 2138    Fredia Sorrow, MD 12/07/20 1330

## 2020-12-04 ENCOUNTER — Encounter (HOSPITAL_COMMUNITY): Admission: EM | Disposition: A | Discharge status: Home / Self Care | Payer: Self-pay | Source: Home / Self Care

## 2020-12-04 ENCOUNTER — Encounter (HOSPITAL_COMMUNITY): Payer: Self-pay | Admitting: Anesthesiology

## 2020-12-04 ENCOUNTER — Emergency Department (HOSPITAL_COMMUNITY): Payer: 59 | Admitting: Anesthesiology

## 2020-12-04 ENCOUNTER — Telehealth: Payer: Self-pay | Admitting: Internal Medicine

## 2020-12-04 DIAGNOSIS — K251 Acute gastric ulcer with perforation: Secondary | ICD-10-CM | POA: Diagnosis not present

## 2020-12-04 DIAGNOSIS — F17290 Nicotine dependence, other tobacco product, uncomplicated: Secondary | ICD-10-CM | POA: Diagnosis present

## 2020-12-04 DIAGNOSIS — Z800 Family history of malignant neoplasm of digestive organs: Secondary | ICD-10-CM | POA: Diagnosis not present

## 2020-12-04 DIAGNOSIS — R059 Cough, unspecified: Secondary | ICD-10-CM | POA: Diagnosis not present

## 2020-12-04 DIAGNOSIS — K56609 Unspecified intestinal obstruction, unspecified as to partial versus complete obstruction: Secondary | ICD-10-CM | POA: Diagnosis not present

## 2020-12-04 DIAGNOSIS — K3189 Other diseases of stomach and duodenum: Secondary | ICD-10-CM | POA: Diagnosis not present

## 2020-12-04 DIAGNOSIS — H548 Legal blindness, as defined in USA: Secondary | ICD-10-CM | POA: Diagnosis present

## 2020-12-04 DIAGNOSIS — R634 Abnormal weight loss: Secondary | ICD-10-CM | POA: Diagnosis present

## 2020-12-04 DIAGNOSIS — Z79899 Other long term (current) drug therapy: Secondary | ICD-10-CM | POA: Diagnosis not present

## 2020-12-04 DIAGNOSIS — Z86718 Personal history of other venous thrombosis and embolism: Secondary | ICD-10-CM | POA: Diagnosis not present

## 2020-12-04 DIAGNOSIS — H4032X0 Glaucoma secondary to eye trauma, left eye, stage unspecified: Secondary | ICD-10-CM | POA: Diagnosis present

## 2020-12-04 DIAGNOSIS — Z872 Personal history of diseases of the skin and subcutaneous tissue: Secondary | ICD-10-CM | POA: Diagnosis not present

## 2020-12-04 DIAGNOSIS — K651 Peritoneal abscess: Secondary | ICD-10-CM | POA: Diagnosis not present

## 2020-12-04 DIAGNOSIS — D62 Acute posthemorrhagic anemia: Secondary | ICD-10-CM | POA: Diagnosis not present

## 2020-12-04 DIAGNOSIS — T797XXA Traumatic subcutaneous emphysema, initial encounter: Secondary | ICD-10-CM | POA: Diagnosis not present

## 2020-12-04 DIAGNOSIS — K6811 Postprocedural retroperitoneal abscess: Secondary | ICD-10-CM | POA: Diagnosis not present

## 2020-12-04 DIAGNOSIS — K631 Perforation of intestine (nontraumatic): Secondary | ICD-10-CM | POA: Diagnosis not present

## 2020-12-04 DIAGNOSIS — T8143XA Infection following a procedure, organ and space surgical site, initial encounter: Secondary | ICD-10-CM | POA: Diagnosis not present

## 2020-12-04 DIAGNOSIS — M5137 Other intervertebral disc degeneration, lumbosacral region: Secondary | ICD-10-CM | POA: Diagnosis not present

## 2020-12-04 DIAGNOSIS — K567 Ileus, unspecified: Secondary | ICD-10-CM | POA: Diagnosis not present

## 2020-12-04 DIAGNOSIS — K658 Other peritonitis: Secondary | ICD-10-CM | POA: Diagnosis not present

## 2020-12-04 DIAGNOSIS — B9620 Unspecified Escherichia coli [E. coli] as the cause of diseases classified elsewhere: Secondary | ICD-10-CM | POA: Diagnosis not present

## 2020-12-04 DIAGNOSIS — R9431 Abnormal electrocardiogram [ECG] [EKG]: Secondary | ICD-10-CM | POA: Diagnosis not present

## 2020-12-04 DIAGNOSIS — K55019 Acute (reversible) ischemia of small intestine, extent unspecified: Secondary | ICD-10-CM | POA: Diagnosis not present

## 2020-12-04 DIAGNOSIS — Z23 Encounter for immunization: Secondary | ICD-10-CM | POA: Diagnosis not present

## 2020-12-04 DIAGNOSIS — Z9889 Other specified postprocedural states: Secondary | ICD-10-CM | POA: Diagnosis not present

## 2020-12-04 DIAGNOSIS — K659 Peritonitis, unspecified: Secondary | ICD-10-CM | POA: Diagnosis not present

## 2020-12-04 DIAGNOSIS — Z978 Presence of other specified devices: Secondary | ICD-10-CM | POA: Diagnosis not present

## 2020-12-04 DIAGNOSIS — R1084 Generalized abdominal pain: Secondary | ICD-10-CM | POA: Diagnosis not present

## 2020-12-04 DIAGNOSIS — K9189 Other postprocedural complications and disorders of digestive system: Secondary | ICD-10-CM | POA: Diagnosis not present

## 2020-12-04 DIAGNOSIS — K668 Other specified disorders of peritoneum: Secondary | ICD-10-CM | POA: Diagnosis not present

## 2020-12-04 DIAGNOSIS — E119 Type 2 diabetes mellitus without complications: Secondary | ICD-10-CM | POA: Diagnosis not present

## 2020-12-04 DIAGNOSIS — Z85048 Personal history of other malignant neoplasm of rectum, rectosigmoid junction, and anus: Secondary | ICD-10-CM | POA: Diagnosis not present

## 2020-12-04 DIAGNOSIS — K559 Vascular disorder of intestine, unspecified: Secondary | ICD-10-CM | POA: Diagnosis not present

## 2020-12-04 DIAGNOSIS — K6389 Other specified diseases of intestine: Secondary | ICD-10-CM | POA: Diagnosis not present

## 2020-12-04 DIAGNOSIS — Z20822 Contact with and (suspected) exposure to covid-19: Secondary | ICD-10-CM | POA: Diagnosis not present

## 2020-12-04 DIAGNOSIS — I700 Atherosclerosis of aorta: Secondary | ICD-10-CM | POA: Diagnosis not present

## 2020-12-04 DIAGNOSIS — Z681 Body mass index (BMI) 19 or less, adult: Secondary | ICD-10-CM | POA: Diagnosis not present

## 2020-12-04 DIAGNOSIS — R188 Other ascites: Secondary | ICD-10-CM | POA: Diagnosis not present

## 2020-12-04 DIAGNOSIS — K633 Ulcer of intestine: Secondary | ICD-10-CM | POA: Diagnosis not present

## 2020-12-04 DIAGNOSIS — K7689 Other specified diseases of liver: Secondary | ICD-10-CM | POA: Diagnosis not present

## 2020-12-04 DIAGNOSIS — M25512 Pain in left shoulder: Secondary | ICD-10-CM | POA: Diagnosis not present

## 2020-12-04 DIAGNOSIS — K5730 Diverticulosis of large intestine without perforation or abscess without bleeding: Secondary | ICD-10-CM | POA: Diagnosis not present

## 2020-12-04 DIAGNOSIS — I10 Essential (primary) hypertension: Secondary | ICD-10-CM | POA: Diagnosis not present

## 2020-12-04 DIAGNOSIS — N281 Cyst of kidney, acquired: Secondary | ICD-10-CM | POA: Diagnosis not present

## 2020-12-04 HISTORY — PX: LAPAROTOMY: SHX154

## 2020-12-04 LAB — LACTIC ACID, PLASMA
Lactic Acid, Venous: 1.1 mmol/L (ref 0.5–1.9)
Lactic Acid, Venous: 2.8 mmol/L (ref 0.5–1.9)

## 2020-12-04 LAB — BASIC METABOLIC PANEL
Anion gap: 10 (ref 5–15)
BUN: 12 mg/dL (ref 8–23)
CO2: 25 mmol/L (ref 22–32)
Calcium: 8.7 mg/dL — ABNORMAL LOW (ref 8.9–10.3)
Chloride: 100 mmol/L (ref 98–111)
Creatinine, Ser: 1.02 mg/dL (ref 0.61–1.24)
GFR, Estimated: >60 mL/min (ref >60)
Glucose, Bld: 195 mg/dL — ABNORMAL HIGH (ref 70–99)
Potassium: 3.8 mmol/L (ref 3.5–5.1)
Sodium: 135 mmol/L (ref 135–145)

## 2020-12-04 LAB — CBC
HCT: 40.3 % (ref 39.0–52.0)
Hemoglobin: 13.3 g/dL (ref 13.0–17.0)
MCH: 28.7 pg (ref 26.0–34.0)
MCHC: 33 g/dL (ref 30.0–36.0)
MCV: 87 fL (ref 80.0–100.0)
Platelets: 443 10*3/uL — ABNORMAL HIGH (ref 150–400)
RBC: 4.63 MIL/uL (ref 4.22–5.81)
RDW: 14 % (ref 11.5–15.5)
WBC: 9.1 10*3/uL (ref 4.0–10.5)
nRBC: 0 % (ref 0.0–0.2)

## 2020-12-04 LAB — HIV ANTIBODY (ROUTINE TESTING W REFLEX): HIV Screen 4th Generation wRfx: NONREACTIVE

## 2020-12-04 SURGERY — LAPAROTOMY, EXPLORATORY
Anesthesia: General | Site: Abdomen

## 2020-12-04 MED ORDER — MORPHINE SULFATE (PF) 2 MG/ML IV SOLN
2.0000 mg | INTRAVENOUS | Status: DC | PRN
  Administered 2020-12-04 – 2020-12-05 (×9): 4 mg via INTRAVENOUS
  Administered 2020-12-05: 2 mg via INTRAVENOUS
  Administered 2020-12-05: 4 mg via INTRAVENOUS
  Administered 2020-12-05: 2 mg via INTRAVENOUS
  Administered 2020-12-05 – 2020-12-06 (×5): 4 mg via INTRAVENOUS
  Administered 2020-12-06: 2 mg via INTRAVENOUS
  Administered 2020-12-06 – 2020-12-07 (×3): 4 mg via INTRAVENOUS
  Administered 2020-12-07: 2 mg via INTRAVENOUS
  Administered 2020-12-07 (×3): 4 mg via INTRAVENOUS
  Administered 2020-12-08: 2 mg via INTRAVENOUS
  Administered 2020-12-08: 4 mg via INTRAVENOUS
  Administered 2020-12-08 – 2020-12-09 (×5): 2 mg via INTRAVENOUS
  Administered 2020-12-10 (×2): 4 mg via INTRAVENOUS
  Administered 2020-12-10: 2 mg via INTRAVENOUS
  Administered 2020-12-10 – 2020-12-11 (×4): 4 mg via INTRAVENOUS
  Administered 2020-12-11: 2 mg via INTRAVENOUS
  Administered 2020-12-11 – 2020-12-12 (×4): 4 mg via INTRAVENOUS
  Administered 2020-12-12: 2 mg via INTRAVENOUS
  Administered 2020-12-12 – 2020-12-14 (×8): 4 mg via INTRAVENOUS
  Administered 2020-12-14: 2 mg via INTRAVENOUS
  Administered 2020-12-14: 4 mg via INTRAVENOUS
  Administered 2020-12-15 (×2): 2 mg via INTRAVENOUS
  Administered 2020-12-15: 4 mg via INTRAVENOUS
  Administered 2020-12-15: 2 mg via INTRAVENOUS
  Administered 2020-12-16: 4 mg via INTRAVENOUS
  Administered 2020-12-16 – 2020-12-17 (×3): 2 mg via INTRAVENOUS
  Administered 2020-12-17: 4 mg via INTRAVENOUS
  Filled 2020-12-04: qty 2
  Filled 2020-12-04 (×2): qty 1
  Filled 2020-12-04 (×8): qty 2
  Filled 2020-12-04 (×2): qty 1
  Filled 2020-12-04: qty 2
  Filled 2020-12-04: qty 1
  Filled 2020-12-04 (×2): qty 2
  Filled 2020-12-04: qty 1
  Filled 2020-12-04 (×3): qty 2
  Filled 2020-12-04: qty 1
  Filled 2020-12-04 (×3): qty 2
  Filled 2020-12-04 (×2): qty 1
  Filled 2020-12-04 (×4): qty 2
  Filled 2020-12-04: qty 1
  Filled 2020-12-04: qty 2
  Filled 2020-12-04: qty 1
  Filled 2020-12-04 (×2): qty 2
  Filled 2020-12-04 (×2): qty 1
  Filled 2020-12-04: qty 2
  Filled 2020-12-04: qty 1
  Filled 2020-12-04 (×5): qty 2
  Filled 2020-12-04: qty 1
  Filled 2020-12-04 (×8): qty 2
  Filled 2020-12-04: qty 1
  Filled 2020-12-04 (×2): qty 2
  Filled 2020-12-04: qty 1
  Filled 2020-12-04 (×4): qty 2
  Filled 2020-12-04 (×2): qty 1

## 2020-12-04 MED ORDER — ENOXAPARIN SODIUM 40 MG/0.4ML IJ SOSY
40.0000 mg | PREFILLED_SYRINGE | INTRAMUSCULAR | Status: DC
  Administered 2020-12-04 – 2020-12-09 (×6): 40 mg via SUBCUTANEOUS
  Filled 2020-12-04 (×6): qty 0.4

## 2020-12-04 MED ORDER — LACTATED RINGERS IV SOLN: INTRAVENOUS | Status: DC | PRN

## 2020-12-04 MED ORDER — LIDOCAINE HCL (CARDIAC) PF 100 MG/5ML IV SOSY
PREFILLED_SYRINGE | INTRAVENOUS | Status: DC | PRN
  Administered 2020-12-04: 40 mg via INTRATRACHEAL

## 2020-12-04 MED ORDER — PROPOFOL 10 MG/ML IV BOLUS
INTRAVENOUS | Status: DC | PRN
  Administered 2020-12-04: 120 mg via INTRAVENOUS

## 2020-12-04 MED ORDER — MIDAZOLAM HCL 2 MG/2ML IJ SOLN
INTRAMUSCULAR | Status: AC
  Filled 2020-12-04: qty 2

## 2020-12-04 MED ORDER — WHITE PETROLATUM EX OINT
TOPICAL_OINTMENT | CUTANEOUS | Status: AC
  Filled 2020-12-04: qty 28.35

## 2020-12-04 MED ORDER — LIDOCAINE 2% (20 MG/ML) 5 ML SYRINGE
INTRAMUSCULAR | Status: AC
  Filled 2020-12-04: qty 5

## 2020-12-04 MED ORDER — ONDANSETRON HCL 4 MG/2ML IJ SOLN
INTRAMUSCULAR | Status: DC | PRN
  Administered 2020-12-04: 4 mg via INTRAVENOUS

## 2020-12-04 MED ORDER — FENTANYL CITRATE (PF) 100 MCG/2ML IJ SOLN
25.0000 ug | INTRAMUSCULAR | Status: DC | PRN
  Administered 2020-12-04 (×2): 50 ug via INTRAVENOUS

## 2020-12-04 MED ORDER — PIPERACILLIN-TAZOBACTAM 3.375 G IVPB
3.3750 g | Freq: Three times a day (TID) | INTRAVENOUS | Status: DC
  Administered 2020-12-04 – 2020-12-15 (×33): 3.375 g via INTRAVENOUS
  Filled 2020-12-04 (×33): qty 50

## 2020-12-04 MED ORDER — DEXAMETHASONE SODIUM PHOSPHATE 10 MG/ML IJ SOLN
INTRAMUSCULAR | Status: AC
  Filled 2020-12-04: qty 1

## 2020-12-04 MED ORDER — LABETALOL HCL 5 MG/ML IV SOLN
10.0000 mg | Freq: Once | INTRAVENOUS | Status: AC
  Administered 2020-12-04: 10 mg via INTRAVENOUS

## 2020-12-04 MED ORDER — 0.9 % SODIUM CHLORIDE (POUR BTL) OPTIME
TOPICAL | Status: DC | PRN
  Administered 2020-12-04: 1000 mL

## 2020-12-04 MED ORDER — ONDANSETRON HCL 4 MG/2ML IJ SOLN
4.0000 mg | Freq: Four times a day (QID) | INTRAMUSCULAR | Status: DC | PRN
  Administered 2020-12-16 – 2020-12-20 (×2): 4 mg via INTRAVENOUS
  Filled 2020-12-04 (×3): qty 2

## 2020-12-04 MED ORDER — PROPOFOL 10 MG/ML IV BOLUS
INTRAVENOUS | Status: AC
  Filled 2020-12-04: qty 20

## 2020-12-04 MED ORDER — SUCCINYLCHOLINE CHLORIDE 200 MG/10ML IV SOSY
PREFILLED_SYRINGE | INTRAVENOUS | Status: AC
  Filled 2020-12-04: qty 10

## 2020-12-04 MED ORDER — FENTANYL CITRATE (PF) 100 MCG/2ML IJ SOLN
INTRAMUSCULAR | Status: AC
  Filled 2020-12-04: qty 2

## 2020-12-04 MED ORDER — HYDROMORPHONE HCL 1 MG/ML IJ SOLN: 0.5000 mg | INTRAMUSCULAR | Status: DC | PRN

## 2020-12-04 MED ORDER — ALBUMIN HUMAN 5 % IV SOLN
INTRAVENOUS | Status: DC | PRN
Start: 2020-12-04 — End: 2020-12-04

## 2020-12-04 MED ORDER — HYDROMORPHONE HCL 1 MG/ML IJ SOLN: 0.2500 mg | INTRAMUSCULAR | Status: DC | PRN

## 2020-12-04 MED ORDER — ACETAMINOPHEN 10 MG/ML IV SOLN
1000.0000 mg | Freq: Four times a day (QID) | INTRAVENOUS | Status: AC
  Administered 2020-12-04 – 2020-12-05 (×4): 1000 mg via INTRAVENOUS
  Filled 2020-12-04 (×4): qty 100

## 2020-12-04 MED ORDER — SUGAMMADEX SODIUM 200 MG/2ML IV SOLN
INTRAVENOUS | Status: DC | PRN
  Administered 2020-12-04: 200 mg via INTRAVENOUS

## 2020-12-04 MED ORDER — ONDANSETRON HCL 4 MG/2ML IJ SOLN
INTRAMUSCULAR | Status: AC
  Administered 2020-12-04: 4 mg via INTRAVENOUS
  Filled 2020-12-04: qty 2

## 2020-12-04 MED ORDER — ONDANSETRON HCL 4 MG/2ML IJ SOLN
INTRAMUSCULAR | Status: AC
  Filled 2020-12-04: qty 2

## 2020-12-04 MED ORDER — TIMOLOL MALEATE 0.5 % OP SOLN
1.0000 [drp] | Freq: Every morning | OPHTHALMIC | Status: DC
  Administered 2020-12-05 – 2020-12-21 (×17): 1 [drp] via OPHTHALMIC
  Filled 2020-12-04 (×4): qty 5

## 2020-12-04 MED ORDER — DIPHENHYDRAMINE HCL 50 MG/ML IJ SOLN
12.5000 mg | Freq: Four times a day (QID) | INTRAMUSCULAR | Status: DC | PRN
  Administered 2020-12-06 – 2020-12-09 (×4): 12.5 mg via INTRAVENOUS
  Filled 2020-12-04 (×4): qty 1

## 2020-12-04 MED ORDER — PANTOPRAZOLE SODIUM 40 MG IV SOLR
40.0000 mg | Freq: Every day | INTRAVENOUS | Status: DC
  Administered 2020-12-04 – 2020-12-17 (×14): 40 mg via INTRAVENOUS
  Filled 2020-12-04 (×14): qty 40

## 2020-12-04 MED ORDER — EPHEDRINE 5 MG/ML INJ
INTRAVENOUS | Status: AC
  Filled 2020-12-04: qty 5

## 2020-12-04 MED ORDER — ROCURONIUM BROMIDE 10 MG/ML (PF) SYRINGE
PREFILLED_SYRINGE | INTRAVENOUS | Status: AC
  Filled 2020-12-04: qty 10

## 2020-12-04 MED ORDER — AMISULPRIDE (ANTIEMETIC) 5 MG/2ML IV SOLN
INTRAVENOUS | Status: AC
  Filled 2020-12-04: qty 4

## 2020-12-04 MED ORDER — ARTIFICIAL TEARS OPHTHALMIC OINT
TOPICAL_OINTMENT | OPHTHALMIC | Status: AC
  Filled 2020-12-04: qty 3.5

## 2020-12-04 MED ORDER — LABETALOL HCL 5 MG/ML IV SOLN
INTRAVENOUS | Status: AC
  Filled 2020-12-04: qty 4

## 2020-12-04 MED ORDER — AMISULPRIDE (ANTIEMETIC) 5 MG/2ML IV SOLN
10.0000 mg | Freq: Once | INTRAVENOUS | Status: AC
  Administered 2020-12-04: 10 mg via INTRAVENOUS

## 2020-12-04 MED ORDER — CHLORHEXIDINE GLUCONATE CLOTH 2 % EX PADS: 6.0000 | MEDICATED_PAD | Freq: Once | CUTANEOUS | Status: DC

## 2020-12-04 MED ORDER — HYDROMORPHONE HCL 1 MG/ML IJ SOLN
INTRAMUSCULAR | Status: AC
  Administered 2020-12-04: 0.5 mg via INTRAVENOUS
  Filled 2020-12-04: qty 1

## 2020-12-04 MED ORDER — FENTANYL CITRATE (PF) 100 MCG/2ML IJ SOLN
50.0000 ug | Freq: Once | INTRAMUSCULAR | Status: AC
  Administered 2020-12-04: 50 ug via INTRAVENOUS

## 2020-12-04 MED ORDER — METHOCARBAMOL 1000 MG/10ML IJ SOLN
500.0000 mg | Freq: Three times a day (TID) | INTRAVENOUS | Status: DC
  Administered 2020-12-04 – 2020-12-07 (×9): 500 mg via INTRAVENOUS
  Filled 2020-12-04 (×2): qty 5
  Filled 2020-12-04: qty 500
  Filled 2020-12-04 (×2): qty 5
  Filled 2020-12-04: qty 500
  Filled 2020-12-04: qty 5
  Filled 2020-12-04: qty 500
  Filled 2020-12-04: qty 5
  Filled 2020-12-04: qty 500
  Filled 2020-12-04 (×2): qty 5

## 2020-12-04 MED ORDER — MIDAZOLAM HCL 5 MG/5ML IJ SOLN
INTRAMUSCULAR | Status: DC | PRN
  Administered 2020-12-04: 2 mg via INTRAVENOUS

## 2020-12-04 MED ORDER — HYDROMORPHONE HCL 1 MG/ML IJ SOLN
INTRAMUSCULAR | Status: AC
  Filled 2020-12-04: qty 1

## 2020-12-04 MED ORDER — ROCURONIUM BROMIDE 100 MG/10ML IV SOLN
INTRAVENOUS | Status: DC | PRN
  Administered 2020-12-04: 50 mg via INTRAVENOUS

## 2020-12-04 MED ORDER — PHENYLEPHRINE 40 MCG/ML (10ML) SYRINGE FOR IV PUSH (FOR BLOOD PRESSURE SUPPORT)
PREFILLED_SYRINGE | INTRAVENOUS | Status: AC
  Filled 2020-12-04: qty 10

## 2020-12-04 MED ORDER — DEXTROSE-NACL 5-0.45 % IV SOLN
INTRAVENOUS | Status: DC
  Filled 2020-12-04: qty 1000

## 2020-12-04 MED ORDER — FENTANYL CITRATE (PF) 250 MCG/5ML IJ SOLN
INTRAMUSCULAR | Status: AC
  Filled 2020-12-04: qty 5

## 2020-12-04 MED ORDER — FENTANYL CITRATE (PF) 250 MCG/5ML IJ SOLN
INTRAMUSCULAR | Status: DC | PRN
  Administered 2020-12-04 (×2): 50 ug via INTRAVENOUS
  Administered 2020-12-04: 100 ug via INTRAVENOUS
  Administered 2020-12-04 (×3): 50 ug via INTRAVENOUS

## 2020-12-04 MED ORDER — MORPHINE SULFATE (PF) 2 MG/ML IV SOLN
INTRAVENOUS | Status: AC
  Filled 2020-12-04: qty 2

## 2020-12-04 MED ORDER — SUCCINYLCHOLINE CHLORIDE 200 MG/10ML IV SOSY
PREFILLED_SYRINGE | INTRAVENOUS | Status: DC | PRN
  Administered 2020-12-04: 120 mg via INTRAVENOUS

## 2020-12-04 MED ORDER — SUGAMMADEX SODIUM 500 MG/5ML IV SOLN
INTRAVENOUS | Status: AC
  Filled 2020-12-04: qty 5

## 2020-12-04 SURGICAL SUPPLY — 53 items
ADH SKN CLS APL DERMABOND .7 (GAUZE/BANDAGES/DRESSINGS)
APL PRP STRL LF DISP 70% ISPRP (MISCELLANEOUS) ×1
BAG COUNTER SPONGE SURGICOUNT (BAG) ×2 IMPLANT
BAG SPNG CNTER NS LX DISP (BAG) ×1
BLADE CLIPPER SURG (BLADE) IMPLANT
CANISTER SUCT 3000ML PPV (MISCELLANEOUS) ×2 IMPLANT
CHLORAPREP W/TINT 26 (MISCELLANEOUS) ×2 IMPLANT
COVER SURGICAL LIGHT HANDLE (MISCELLANEOUS) ×2 IMPLANT
DERMABOND ADVANCED (GAUZE/BANDAGES/DRESSINGS)
DERMABOND ADVANCED .7 DNX12 (GAUZE/BANDAGES/DRESSINGS) ×2 IMPLANT
DRAPE INCISE IOBAN 66X45 STRL (DRAPES) ×2 IMPLANT
DRAPE LAPAROSCOPIC ABDOMINAL (DRAPES) ×2 IMPLANT
DRAPE WARM FLUID 44X44 (DRAPES) ×2 IMPLANT
DRSG OPSITE POSTOP 4X10 (GAUZE/BANDAGES/DRESSINGS) ×1 IMPLANT
DRSG OPSITE POSTOP 4X12 (GAUZE/BANDAGES/DRESSINGS) ×1 IMPLANT
DRSG OPSITE POSTOP 4X8 (GAUZE/BANDAGES/DRESSINGS) IMPLANT
ELECT BLADE 6.5 EXT (BLADE) IMPLANT
ELECT CAUTERY BLADE 6.4 (BLADE) ×2 IMPLANT
ELECT REM PT RETURN 9FT ADLT (ELECTROSURGICAL) ×2
ELECTRODE REM PT RTRN 9FT ADLT (ELECTROSURGICAL) ×1 IMPLANT
GLOVE SURG POLY MICRO LF SZ5.5 (GLOVE) ×2 IMPLANT
GLOVE SURG UNDER POLY LF SZ6 (GLOVE) ×2 IMPLANT
GOWN STRL REUS W/ TWL LRG LVL3 (GOWN DISPOSABLE) ×2 IMPLANT
GOWN STRL REUS W/TWL LRG LVL3 (GOWN DISPOSABLE) ×4
HANDLE SUCTION POOLE (INSTRUMENTS) ×1 IMPLANT
KIT BASIN OR (CUSTOM PROCEDURE TRAY) ×2 IMPLANT
KIT TURNOVER KIT B (KITS) ×2 IMPLANT
LIGASURE IMPACT 36 18CM CVD LR (INSTRUMENTS) ×1 IMPLANT
NS IRRIG 1000ML POUR BTL (IV SOLUTION) ×4 IMPLANT
PACK GENERAL/GYN (CUSTOM PROCEDURE TRAY) ×2 IMPLANT
PAD ARMBOARD 7.5X6 YLW CONV (MISCELLANEOUS) ×2 IMPLANT
PENCIL SMOKE EVACUATOR (MISCELLANEOUS) ×2 IMPLANT
RELOAD PROXIMATE 75MM BLUE (ENDOMECHANICALS) ×4 IMPLANT
RELOAD STAPLE 75 3.8 BLU REG (ENDOMECHANICALS) IMPLANT
SLEEVE SUCTION CATH 165 (SLEEVE) ×2 IMPLANT
SPECIMEN JAR LARGE (MISCELLANEOUS) ×1 IMPLANT
SPONGE T-LAP 18X18 ~~LOC~~+RFID (SPONGE) ×2 IMPLANT
STAPLER GUN LINEAR PROX 60 (STAPLE) ×1 IMPLANT
STAPLER PROXIMATE 75MM BLUE (STAPLE) ×1 IMPLANT
STAPLER VISISTAT 35W (STAPLE) ×1 IMPLANT
SUCTION POOLE HANDLE (INSTRUMENTS) ×2
SUT MNCRL AB 4-0 PS2 18 (SUTURE) ×2 IMPLANT
SUT PDS AB 1 TP1 96 (SUTURE) ×4 IMPLANT
SUT SILK 2 0 SH CR/8 (SUTURE) ×2 IMPLANT
SUT SILK 2 0 TIES 10X30 (SUTURE) ×2 IMPLANT
SUT SILK 3 0 SH CR/8 (SUTURE) ×2 IMPLANT
SUT SILK 3 0 TIES 10X30 (SUTURE) ×2 IMPLANT
SUT VIC AB 3-0 SH 18 (SUTURE) IMPLANT
SUT VIC AB 3-0 SH 27 (SUTURE) ×4
SUT VIC AB 3-0 SH 27XBRD (SUTURE) ×2 IMPLANT
TOWEL GREEN STERILE (TOWEL DISPOSABLE) ×2 IMPLANT
TRAY FOLEY MTR SLVR 16FR STAT (SET/KITS/TRAYS/PACK) IMPLANT
YANKAUER SUCT BULB TIP NO VENT (SUCTIONS) IMPLANT

## 2020-12-04 NOTE — Anesthesia Preprocedure Evaluation (Addendum)
Anesthesia Evaluation  Patient identified by MRN, date of birth, ID band Patient awake    Reviewed: Allergy & Precautions, H&P , NPO status , Patient's Chart, lab work & pertinent test results  Airway Mallampati: I  TM Distance: >3 FB Neck ROM: Full    Dental no notable dental hx. (+) Teeth Intact, Dental Advisory Given   Pulmonary neg pulmonary ROS, Current Smoker and Patient abstained from smoking.,    Pulmonary exam normal breath sounds clear to auscultation       Cardiovascular hypertension, Pt. on medications  Rhythm:Regular Rate:Normal     Neuro/Psych negative neurological ROS  negative psych ROS   GI/Hepatic negative GI ROS, Neg liver ROS,   Endo/Other  negative endocrine ROS  Renal/GU negative Renal ROS  negative genitourinary   Musculoskeletal  (+) Arthritis , Osteoarthritis,    Abdominal   Peds  Hematology  (+) Blood dyscrasia, anemia ,   Anesthesia Other Findings   Reproductive/Obstetrics negative OB ROS                            Anesthesia Physical Anesthesia Plan  ASA: 2 and emergent  Anesthesia Plan: General   Post-op Pain Management:    Induction: Intravenous, Rapid sequence and Cricoid pressure planned  PONV Risk Score and Plan: 2 and Ondansetron, Dexamethasone and Midazolam  Airway Management Planned: Oral ETT  Additional Equipment:   Intra-op Plan:   Post-operative Plan: Extubation in OR  Informed Consent: I have reviewed the patients History and Physical, chart, labs and discussed the procedure including the risks, benefits and alternatives for the proposed anesthesia with the patient or authorized representative who has indicated his/her understanding and acceptance.     Dental advisory given  Plan Discussed with: CRNA  Anesthesia Plan Comments:         Anesthesia Quick Evaluation

## 2020-12-04 NOTE — Progress Notes (Addendum)
Day of Surgery  Subjective: Post op check.  Doing fairly well with pain control.  Hasn't voided yet.  Explained what was found during surgery.  ROS: See above, otherwise other systems negative  Objective: Vital signs in last 24 hours: Temp:  [97.8 F (36.6 C)-99.3 F (37.4 C)] 97.8 F (36.6 C) (09/30 1042) Pulse Rate:  [84-101] 93 (09/30 1042) Resp:  [10-18] 18 (09/30 1042) BP: (115-175)/(75-101) 150/93 (09/30 1042) SpO2:  [93 %-100 %] 98 % (09/30 1042)    Intake/Output from previous day: 09/29 0701 - 09/30 0700 In: 1500 [I.V.:1000; IV Piggyback:500] Out: 100 [Blood:100] Intake/Output this shift: No intake/output data recorded.  PE: Abd: soft, appropriately tender, NGT in place with no output currently, some bile in tubing, midline wound with some SS drainage noted on honeycomb  Lab Results:  Recent Labs    12/03/20 2023 12/04/20 1110  WBC 10.8* 9.1  HGB 12.0* 13.3  HCT 36.3* 40.3  PLT 417* 443*   BMET Recent Labs    12/03/20 2023 12/04/20 1110  NA 136 135  K 3.8 3.8  CL 101 100  CO2 27 25  GLUCOSE 99 195*  BUN 11 12  CREATININE 0.83 1.02  CALCIUM 9.5 8.7*   PT/INR No results for input(s): LABPROT, INR in the last 72 hours. CMP     Component Value Date/Time   NA 135 12/04/2020 1110   NA 139 12/27/2016 1237   K 3.8 12/04/2020 1110   K 4.2 12/27/2016 1237   CL 100 12/04/2020 1110   CO2 25 12/04/2020 1110   CO2 25 12/27/2016 1237   GLUCOSE 195 (H) 12/04/2020 1110   GLUCOSE 112 12/27/2016 1237   BUN 12 12/04/2020 1110   BUN 15.2 12/27/2016 1237   CREATININE 1.02 12/04/2020 1110   CREATININE 1.1 12/27/2016 1237   CALCIUM 8.7 (L) 12/04/2020 1110   CALCIUM 9.7 12/27/2016 1237   PROT 6.7 12/03/2020 2023   PROT 7.6 12/27/2016 1237   ALBUMIN 3.8 12/03/2020 2023   ALBUMIN 4.1 12/27/2016 1237   AST 15 12/03/2020 2023   AST 20 12/27/2016 1237   ALT 13 12/03/2020 2023   ALT 18 12/27/2016 1237   ALKPHOS 78 12/03/2020 2023   ALKPHOS 80 12/27/2016  1237   BILITOT 0.3 12/03/2020 2023   BILITOT 0.45 12/27/2016 1237   GFRNONAA >60 12/04/2020 1110   GFRAA >60 11/20/2017 0948   Lipase     Component Value Date/Time   LIPASE 17 12/03/2020 2023       Studies/Results: CT ABDOMEN PELVIS W CONTRAST  Result Date: 12/03/2020 CLINICAL DATA:  Abdominal pain, fever, nausea, unspecified abdominal pain. EXAM: CT ABDOMEN AND PELVIS WITH CONTRAST TECHNIQUE: Multidetector CT imaging of the abdomen and pelvis was performed using the standard protocol following bolus administration of intravenous contrast. CONTRAST:  43mL OMNIPAQUE IOHEXOL 350 MG/ML SOLN COMPARISON:  11/16/2020 FINDINGS: Lower chest: No acute abnormality. Hepatobiliary: Punctate foci of free intraperitoneal gas are seen tracking within the biliary hilum. Simple cyst noted within the right hepatic dome. The liver is otherwise unremarkable. Gallbladder unremarkable. No intra or extrahepatic biliary ductal dilation. Pancreas: Unremarkable Spleen: Unremarkable Adrenals/Urinary Tract: The adrenal glands are unremarkable. The kidneys are normal in size and position. Scattered hypodensities are seen which are too small to accurately characterize on this examination but are stable since prior examination and may simply represent tiny cortical cysts are again noted. The kidneys are otherwise unremarkable. The bladder is unremarkable. Stomach/Bowel: Since the prior examination there has developed  free intraperitoneal gas with punctate foci of gas seen within the perihepatic and right subdiaphragmatic region as well as tracking within the biliary hilum. The exact source of visceral perforation is not clearly identified on this examination, however, the location favors a proximal enteric source as can be seen with gastric or duodenal ulceration. There is mild free fluid within the abdomen. Mild sigmoid diverticulosis noted. The stomach, small bowel, and large bowel are otherwise unremarkable. The appendix is  not clearly identified. No evidence of obstruction. Vascular/Lymphatic: Extensive aortoiliac atherosclerotic calcification. No aortic aneurysm. No pathologic adenopathy within the abdomen and pelvis. Reproductive: Unremarkable Other: No abdominal wall hernia. Musculoskeletal: No acute bone abnormality. No lytic or blastic bone lesion. IMPRESSION: Interval development of mild ascites and punctate foci of extraluminal gas within the right upper quadrant and biliary hilum. While the exact site of enteric perforation is not identified, a proximal source as can be seen with gastric or duodenal ulceration is suspected given the location of extraluminal gas. Aortic Atherosclerosis (ICD10-I70.0). Electronically Signed   By: Fidela Salisbury M.D.   On: 12/03/2020 22:10    Anti-infectives: Anti-infectives (From admission, onward)    Start     Dose/Rate Route Frequency Ordered Stop   12/04/20 0700  piperacillin-tazobactam (ZOSYN) IVPB 3.375 g        3.375 g 12.5 mL/hr over 240 Minutes Intravenous Every 8 hours 12/04/20 0218     12/03/20 2315  piperacillin-tazobactam (ZOSYN) IVPB 3.375 g        3.375 g 100 mL/hr over 30 Minutes Intravenous  Once 12/03/20 2308 12/04/20 0002        Assessment/Plan  POD 0, s/p ex lap with SBR for focal ischemic perforation of unclear etiology, Dr. Zenia Resides 12/04/20 -cont NGT for now, but if no output, can likely Dc soon -mobilize, pulm toilet -multi-modal pain control     FEN - IVFs, NPO/NGT VTE - lovenox ID - [email or web address]   LOS: 0 days    [name] , [facility] Surgery 12/04/2020, 1:49 PM Please see Amion for pager number during day hours 7:00am-4:30pm or 7:00am -11:30am on weekends

## 2020-12-04 NOTE — Transfer of Care (Signed)
Immediate Anesthesia Transfer of Care Note  Patient: Juan David  Procedure(s) Performed: EXPLORATORY LAPAROTOMY small bowel resection (Abdomen)  Patient Location: PACU  Anesthesia Type:General  Level of Consciousness: drowsy  Airway & Oxygen Therapy: Patient Spontanous Breathing and Patient connected to face mask oxygen  Post-op Assessment: Report given to RN and Post -op Vital signs reviewed and stable  Post vital signs: Reviewed and stable  Last Vitals:  Vitals Value Taken Time  BP 169/92 12/04/20 0445  Temp    Pulse 96 12/04/20 0445  Resp 14 12/04/20 0445  SpO2 98 % 12/04/20 0445  Vitals shown include unvalidated device data.  Last Pain:  Vitals:   12/04/20 0004  PainSc: 6          Complications: No notable events documented.

## 2020-12-04 NOTE — ED Notes (Signed)
Called [facility] to transport patient to [facility] ED--[facility] accepting

## 2020-12-04 NOTE — Anesthesia Procedure Notes (Signed)
Procedure Name: Intubation Date/Time: 12/04/2020 3:26 AM Performed by: Clovis Cao, CRNA Pre-anesthesia Checklist: Patient identified, Emergency Drugs available, Suction available and Patient being monitored Patient Re-evaluated:Patient Re-evaluated prior to induction Oxygen Delivery Method: Circle system utilized Preoxygenation: Pre-oxygenation with 100% oxygen Induction Type: IV induction, Rapid sequence and Cricoid Pressure applied Laryngoscope Size: Glidescope and 4 Grade View: Grade I Tube type: Oral Number of attempts: 3 (DL x1 with Miller 2- unable to pick up epiglottis. No attempt made to place ETT.While waiting for Glidesope, DL x 1 with Sabra Heck 3- no attempt to place ETT. DL x 1 with Glidescope - grade 1 view and easy placemnt of ETT) Airway Equipment and Method: Stylet and Oral airway Placement Confirmation: ETT inserted through vocal cords under direct vision, positive ETCO2 and breath sounds checked- equal and bilateral Secured at: 24 cm Tube secured with: Tape Dental Injury: Teeth and Oropharynx as per pre-operative assessment

## 2020-12-04 NOTE — ED Notes (Signed)
[facility] given report on pt

## 2020-12-04 NOTE — Telephone Encounter (Signed)
Dr. Carlean Purl is aware

## 2020-12-04 NOTE — Telephone Encounter (Signed)
Inbound call from pt's wife stating that she wanted to let Dr. Carlean Purl know that he had emergency surgery this morning about 2:30am. Pt is at [facility]. Thank you.

## 2020-12-04 NOTE — Op Note (Signed)
Date: 12/04/20  Patient: Juan David MRN: [id number]  Preoperative Diagnosis: Pneumoperitoneum secondary to perforated gastric versus duodenal ulcer Postoperative Diagnosis: Small bowel perforation with focal ischemia   Procedure: Exploratory laparotomy, small bowel resection with primary reanastomosis  Surgeon: Michaelle Birks, MD  EBL: 20 mL  Anesthesia: General endotracheal  Specimens: Small bowel  Indications: Mr. Nephew is a 65 year old male who has been having progressive abdominal pain over the last few weeks.  His symptoms acutely worsened over the last day and he developed fevers.  He presented to the ED and a CT scan showed pneumoperitoneum with intra-abdominal free fluid.  Emergent surgical exploration was recommended and he was brought to the operating room.  Findings: Focal area of ischemia on the ileum with a perforation on the antimesenteric border.  Procedure details: Informed consent was obtained in the preoperative area prior to the procedure. The patient was brought to the operating room and placed on the table in the supine position.  General anesthesia was induced and appropriate lines and drains were placed for intraoperative monitoring. Perioperative antibiotics were administered per SCIP guidelines. The abdomen was prepped and draped in the usual sterile fashion. A pre-procedure timeout was taken verifying patient identity, surgical site and procedure to be performed.  A midline skin incision was made and the subcutaneous tissue was divided with cautery to expose the fascia.  The fascia was opened along the linea alba and the peritoneal cavity was entered.  The falciform ligament was taken down with cautery.  A Bookwalter fixed retractor was placed.  On entry into the abdomen there was a moderate amount of turbid fluid in the peritoneal cavity.  The stomach was inspected and was healthy in appearance with no perforations identified.  The duodenum was only partially  kocherized and was able to be fully visualized, and was healthy with no signs of perforation.  The small bowel was then run from the ligament of Treitz to the terminal ileum, and an area of ischemia with an associated 1cm perforation on the antimesenteric border was identified about 40 cm proximal to the ileocecal valve.  The remainder of the small bowel was healthy in appearance.  The colon was run from the cecum to the upper rectum and was normal in appearance.  The ischemic perforated segment of bowel was resected.  A point of healthy bowel was identified proximal to the ischemic segment, a small mesenteric window was created and the bowel was divided with a 75 mm blue load GIA stapler.  The bowel was divided distal to the ischemic area, about 30 cm proximal to the ileocecal cecal valve, using a GIA 75 mm blue load stapler.  The intervening mesentery was divided with LigaSure.  The resected specimen was approximately 40 cm in length, and was passed off the field and sent for routine pathology.  The remaining small bowel was carefully examined again and run and was healthy in appearance.  There was an arterial pulse within the small bowel mesentery.  A side-to-side small bowel anastomosis was created using a 75 mm GIA stapler with a blue load.  The common enterotomy was closed with a TA 60 blue stapler.  The mesenteric defect was closed with 3-0 silk figure-of-eight sutures.  The abdomen was copiously irrigated with warm saline.  The NG tube tip was palpated within the stomach to confirm placement.  The fascia was closed with running looped 1 PDS suture.  The skin was irrigated with saline and loosely closed with staples.  A  sterile dressing was applied.  Upon entering the abdomen (organ space), I encountered infection of the peritoneum (gross contamination with enteric contents) .  CASE DATA:  Type of patient?: DOW CASE (Surgical Hospitalist [facility] In[facility])  Status of Case? EMERGENT Add On  Infection  Present At Time Of Surgery (PATOS)?   Enteric contents within the peritoneum    The patient tolerated the procedure with no apparent complications.  All counts were correct x2 at the end of the procedure. The patient was extubated and taken to PACU in stable condition.  Michaelle Birks, MD 12/04/20 4:56 AM

## 2020-12-04 NOTE — ED Provider Notes (Signed)
I assumed care of this patient.  Please see previous provider note for further details of Hx, PE.  Briefly patient is a 65 y.o. male who presented to droppage for abdominal pain and found to have a perforated ulcer.  Sent here to be admitted and evaluated by general surgery.  Upon arrival patient is hemodynamically stable.  Abdomen is diffusely tender to palpation, with guarding, and rebound.   Will consult general surgery.  Marland Kitchen1-3 Lead EKG Interpretation Performed by: Fatima Blank, MD Authorized by: Fatima Blank, MD     Interpretation: normal     ECG rate:  94   ECG rate assessment: normal     Rhythm: sinus rhythm     Ectopy: none     Conduction: normal   .Critical Care Performed by: Fatima Blank, MD Authorized by: Fatima Blank, MD   Critical care provider statement:    Critical care time (minutes):  30   Critical care was necessary to treat or prevent imminent or life-threatening deterioration of the following conditions: perforated viscus.   Critical care was time spent personally by me on the following activities:  Discussions with consultants, evaluation of patient's response to treatment, examination of patient, ordering and performing treatments and interventions, ordering and review of laboratory studies, ordering and review of radiographic studies, pulse oximetry, re-evaluation of patient's condition, obtaining history from patient or surrogate and review of old charts   Care discussed with: admitting provider     Given worsening exam, patient will go to OR.   Fatima Blank, MD 12/04/20 [phone]

## 2020-12-04 NOTE — ED Notes (Signed)
[facility] left with pt 

## 2020-12-04 NOTE — H&P (Signed)
Juan David Jul 30, 1955  [id number].    Requesting MD: Dr. Leonette Monarch Chief Complaint/Reason for Consult: pneumoperitoneum  HPI:  Mr. Juan David is a 65 year old male who presented to the ED with worsening abdominal pain.  He has been having abdominal pain and diarrhea for the last few weeks.  He reports severe pain after he eats, and as a result has had a 10 pound weight loss over the last 2 weeks.  He was previously seen in the ED on 9/12, at which time his CT scan did not show any etiology for his pain.  He saw Dr. Carlean Purl at [facility] a few days ago and was scheduled for EGD and colonoscopy next week for work-up.  Today his pain has acutely worsened, prompting him to go to the ED.  He says the pain started in the upper abdomen and radiates across the abdomen.  CT scan showed multiple foci of free air, suspicious for perforated gastric or duodenal ulcer.  General surgery was consulted.  He has had no prior abdominal surgeries.  He denies any use of NSAIDs.  He has a history of T2 rectal cancer excised via transanal biopsy in 2018 and for which he was treated with chemoradiation and declined to undergo APR.  He has not had oncology follow-up since 2019.  ROS: Review of Systems  Constitutional:  Positive for chills, fever, malaise/fatigue and weight loss.  Respiratory:  Negative for shortness of breath, wheezing and stridor.   Cardiovascular:  Negative for chest pain.  Gastrointestinal:  Positive for abdominal pain, diarrhea and nausea.  Neurological:  Negative for seizures and weakness.   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   Hypertension    Iron deficiency    Legally blind in left eye, as defined in [place]    per pt only see light   PFO (patent foramen ovale)    per TEE done 05-11-2009    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   Traumatic glaucoma, 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 at [facility] in [place]   08-18-2001   Wears glasses     Past Surgical History:  Procedure Laterality Date   ARTHROSCOPIC REPAIR ACL Right [place] LEFT EYE INJURY  08-18-2001   [facility]   ruptured globe- repair corneal laceration, reposition of prolapsed uveal tissue   HEMORRHOID SURGERY N/A 09/01/2016   Procedure: HEMORRHOIDECTOMY;  Surgeon: Leighton Ruff, MD;  Location: [facility];  Service: General;  Laterality: N/A;   SUPERFICIAL KERATECTOMY Left 06-29-2011    [facility]    with EDTA scrub of left eye   TEE WITHOUT CARDIOVERSION  05-11-2009  dr Dorris Carnes   LVSEF 55-65%/  mild thickened AV without AI/  trace MR/ mixed fixed artherosclerosis plaqueing thoracic aorta/  no evidence thrombus/  by agitated saling and color doppler there was a PFO    Social History:  reports that he  has been smoking cigars. He quit smokeless tobacco use about 12 years ago. He reports current alcohol use. He reports that he does not currently use drugs after having used the following drugs: Marijuana.  Allergies: No Known Allergies  (Not in a hospital admission)    Physical Exam: Blood pressure 125/80, pulse 92, temperature 99.3 F (37.4 C), resp. rate 14, SpO2 93 %. General: resting comfortably, appears stated age, no apparent distress Neurological: alert and oriented, no focal deficits, cranial nerves grossly in tact HEENT: normocephalic, atraumatic, oropharynx clear, no scleral icterus CV: regular rate and rhythm, extremities warm and well-perfused Respiratory: normal work of breathing on room air, symmetric chest wall  expansion Abdomen: soft, nondistended, diffusely tender to palpation with guarding. Extremities: warm and well-perfused, no deformities, moving all extremities spontaneously Psychiatric: normal mood and affect Skin: warm and dry, no jaundice, no rashes or lesions   Results for orders placed or performed during the hospital encounter of 12/03/20 (from the past 48 hour(s))  CBC with Differential     Status: Abnormal   Collection Time: 12/03/20  8:23 PM  Result Value Ref Range   WBC 10.8 (H) 4.0 - 10.5 K/uL   RBC 4.21 (L) 4.22 - 5.81 MIL/uL   Hemoglobin 12.0 (L) 13.0 - 17.0 g/dL   HCT 36.3 (L) 39.0 - 52.0 %   MCV 86.2 80.0 - 100.0 fL   MCH 28.5 26.0 - 34.0 pg   MCHC 33.1 30.0 - 36.0 g/dL   RDW 14.0 11.5 - 15.5 %   Platelets 417 (H) 150 - 400 K/uL   nRBC 0.0 0.0 - 0.2 %   Neutrophils Relative % 87 %   Neutro Abs 9.4 (H) 1.7 - 7.7 K/uL   Lymphocytes Relative 6 %   Lymphs Abs 0.7 0.7 - 4.0 K/uL   Monocytes Relative 5 %   Monocytes Absolute 0.6 0.1 - 1.0 K/uL   Eosinophils Relative 1 %   Eosinophils Absolute 0.1 0.0 - 0.5 K/uL   Basophils Relative 0 %   Basophils Absolute 0.0 0.0 - 0.1 K/uL   Immature Granulocytes 1 %   Abs Immature Granulocytes 0.05 0.00 - 0.07 K/uL    Comment: Performed at [facility], [place], [place] [address]  Comprehensive metabolic panel     Status: None   Collection Time: 12/03/20  8:23 PM  Result Value Ref Range   Sodium 136 135 - 145 mmol/L   Potassium 3.8 3.5 - 5.1 mmol/L   Chloride 101 98 - 111 mmol/L   CO2 27 22 - 32 mmol/L   Glucose, Bld 99 70 - 99 mg/dL    Comment: Glucose reference range applies only to samples taken after fasting for at least 8 hours.   BUN 11 8 - 23 mg/dL   Creatinine, Ser 0.83 0.61 - 1.24 mg/dL   Calcium 9.5 8.9 - 10.3 mg/dL   Total Protein 6.7 6.5 - 8.1 g/dL   Albumin 3.8 3.5 - 5.0 g/dL   AST 15 15 - 41 U/L   ALT 13 0 - 44 U/L   Alkaline Phosphatase 78 38 - 126 U/L   Total Bilirubin  0.3 0.3 - 1.2 mg/dL   GFR, Estimated >60 >60 mL/min    Comment: (NOTE) Calculated using the CKD-EPI Creatinine Equation (2021)    Anion gap 8 5 - 15    Comment: Performed at [facility], [place], [place] [address]  Lipase, blood     Status: None  Collection Time: 12/03/20  8:23 PM  Result Value Ref Range   Lipase 17 11 - 51 U/L    Comment: Performed at [facility], [address], [place], [place] [address]  Urinalysis, Routine w reflex microscopic Urine, Clean Catch     Status: Abnormal   Collection Time: 12/03/20  8:23 PM  Result Value Ref Range   Color, Urine YELLOW YELLOW   APPearance CLEAR CLEAR   Specific Gravity, Urine 1.023 1.005 - 1.030   pH 6.5 5.0 - 8.0   Glucose, UA NEGATIVE NEGATIVE mg/dL   Hgb urine dipstick NEGATIVE NEGATIVE   Bilirubin Urine NEGATIVE NEGATIVE   Ketones, ur NEGATIVE NEGATIVE mg/dL   Protein, ur TRACE (A) NEGATIVE mg/dL   Nitrite NEGATIVE NEGATIVE   Leukocytes,Ua NEGATIVE NEGATIVE    Comment: Performed at [facility], [place], [place] [address]  Resp Panel by RT-PCR (Flu A&B, Covid) Nasopharyngeal Swab     Status: None   Collection Time: 12/03/20  8:23 PM   Specimen: Nasopharyngeal Swab; Nasopharyngeal(NP) swabs in vial transport medium  Result Value Ref Range   SARS Coronavirus 2 by RT PCR NEGATIVE NEGATIVE    Comment: (NOTE) SARS-CoV-2 target nucleic acids are NOT DETECTED.  The SARS-CoV-2 RNA is generally detectable in upper respiratory specimens during the acute phase of infection. The lowest concentration of SARS-CoV-2 viral copies this assay can detect is 138 copies/mL. A negative result does not preclude SARS-Cov-2 infection and should not be used as the sole basis for treatment or other patient management decisions. A negative result may occur with  improper specimen collection/handling, submission of specimen other than nasopharyngeal swab,  presence of viral mutation(s) within the areas targeted by this assay, and inadequate number of viral copies(<138 copies/mL). A negative result must be combined with clinical observations, patient history, and epidemiological information. The expected result is Negative.  Fact Sheet for Patients:  EntrepreneurPulse.com.au  Fact Sheet for Healthcare Providers:  IncredibleEmployment.be  This test is no t yet approved or cleared by the [place] FDA and  has been authorized for detection and/or diagnosis of SARS-CoV-2 by FDA under an Emergency Use Authorization (EUA). This EUA will remain  in effect (meaning this test can be used) for the duration of the COVID-19 declaration under Section 564(b)(1) of the Act, 21 U.S.C.section 360bbb-3(b)(1), unless the authorization is terminated  or revoked sooner.       Influenza A by PCR NEGATIVE NEGATIVE   Influenza B by PCR NEGATIVE NEGATIVE    Comment: (NOTE) The Xpert Xpress SARS-CoV-2/FLU/RSV plus assay is intended as an aid in the diagnosis of influenza from Nasopharyngeal swab specimens and should not be used as a sole basis for treatment. Nasal washings and aspirates are unacceptable for Xpert Xpress SARS-CoV-2/FLU/RSV testing.  Fact Sheet for Patients: EntrepreneurPulse.com.au  Fact Sheet for Healthcare Providers: IncredibleEmployment.be  This test is not yet approved or cleared by the [place] FDA and has been authorized for detection and/or diagnosis of SARS-CoV-2 by FDA under an Emergency Use Authorization (EUA). This EUA will remain in effect (meaning this test can be used) for the duration of the COVID-19 declaration under Section 564(b)(1) of the Act, 21 U.S.C. section 360bbb-3(b)(1), unless the authorization is terminated or revoked.  Performed at [facility], [address], [place], [place] [address]   Lactic acid,  plasma     Status: None   Collection Time: 12/03/20 11:29 PM  Result Value Ref Range   Lactic Acid, Venous  1.1 0.5 - 1.9 mmol/L    Comment: Performed at [facility], [address] [place], [place] [address]   CT ABDOMEN PELVIS W CONTRAST  Result Date: 12/03/2020 CLINICAL DATA:  Abdominal pain, fever, nausea, unspecified abdominal pain. EXAM: CT ABDOMEN AND PELVIS WITH CONTRAST TECHNIQUE: Multidetector CT imaging of the abdomen and pelvis was performed using the standard protocol following bolus administration of intravenous contrast. CONTRAST:  23mL OMNIPAQUE IOHEXOL 350 MG/ML SOLN COMPARISON:  11/16/2020 FINDINGS: Lower chest: No acute abnormality. Hepatobiliary: Punctate foci of free intraperitoneal gas are seen tracking within the biliary hilum. Simple cyst noted within the right hepatic dome. The liver is otherwise unremarkable. Gallbladder unremarkable. No intra or extrahepatic biliary ductal dilation. Pancreas: Unremarkable Spleen: Unremarkable Adrenals/Urinary Tract: The adrenal glands are unremarkable. The kidneys are normal in size and position. Scattered hypodensities are seen which are too small to accurately characterize on this examination but are stable since prior examination and may simply represent tiny cortical cysts are again noted. The kidneys are otherwise unremarkable. The bladder is unremarkable. Stomach/Bowel: Since the prior examination there has developed free intraperitoneal gas with punctate foci of gas seen within the perihepatic and right subdiaphragmatic region as well as tracking within the biliary hilum. The exact source of visceral perforation is not clearly identified on this examination, however, the location favors a proximal enteric source as can be seen with gastric or duodenal ulceration. There is mild free fluid within the abdomen. Mild sigmoid diverticulosis noted. The stomach, small bowel, and large bowel are otherwise unremarkable. The  appendix is not clearly identified. No evidence of obstruction. Vascular/Lymphatic: Extensive aortoiliac atherosclerotic calcification. No aortic aneurysm. No pathologic adenopathy within the abdomen and pelvis. Reproductive: Unremarkable Other: No abdominal wall hernia. Musculoskeletal: No acute bone abnormality. No lytic or blastic bone lesion. IMPRESSION: Interval development of mild ascites and punctate foci of extraluminal gas within the right upper quadrant and biliary hilum. While the exact site of enteric perforation is not identified, a proximal source as can be seen with gastric or duodenal ulceration is suspected given the location of extraluminal gas. Aortic Atherosclerosis (ICD10-I70.0). Electronically Signed   By: Fidela Salisbury M.D.   On: 12/03/2020 22:10      Assessment/Plan This is a 65 year old male presenting with acutely worsening abdominal pain.  I reviewed his CT scan, which shows multiple foci of free air around the porta hepatis, liver, and stomach.  This is most consistent with a duodenal or gastric perforation.  Given his ongoing epigastric pain over the past few weeks, I suspect he has an ulcer that is perforated.  He is currently hemodynamically stable but given his diffuse peritonitis in the setting of pneumoperitoneum I recommended proceeding with exploratory laparotomy.  I discussed the details of the planned surgery with the patient and he agrees to proceed.  He has already received a dose of Zosyn in the ED and will continue on antibiotics postoperatively.  We will proceed to the OR emergently tonight.   Michaelle Birks, MD [facility] General, Hepatobiliary and Pancreatic Surgery 12/04/20 2:18 AM

## 2020-12-04 NOTE — Anesthesia Postprocedure Evaluation (Signed)
Anesthesia Post Note  Patient: Juan David  Procedure(s) Performed: EXPLORATORY LAPAROTOMY small bowel resection (Abdomen)     Patient location during evaluation: PACU Anesthesia Type: General Level of consciousness: awake and alert Pain management: pain level controlled Vital Signs Assessment: post-procedure vital signs reviewed and stable Respiratory status: spontaneous breathing, nonlabored ventilation, respiratory function stable and patient connected to nasal cannula oxygen Cardiovascular status: blood pressure returned to baseline and stable Postop Assessment: no apparent nausea or vomiting Anesthetic complications: no   No notable events documented.  Last Vitals:  Vitals:   12/04/20 0600 12/04/20 0615  BP: (!) 155/97 (!) 157/95  Pulse: 88 88  Resp: 13 15  Temp:    SpO2: 99% 100%    Last Pain:  Vitals:   12/04/20 0600  PainSc: 4                  Swannie Milius,W. EDMOND

## 2020-12-04 NOTE — ED Notes (Signed)
Called report to Janett Billow, Therapist, sports at [facility]

## 2020-12-04 NOTE — ED Notes (Signed)
Pt arrived via [facility] from [facility]. LR infusing, c/o 5/10 pain.

## 2020-12-04 NOTE — Telephone Encounter (Signed)
Procedure cancelled per Dr Celesta Aver instructions.

## 2020-12-05 ENCOUNTER — Encounter (HOSPITAL_COMMUNITY): Payer: Self-pay | Admitting: Surgery

## 2020-12-05 LAB — BASIC METABOLIC PANEL
Anion gap: 10 (ref 5–15)
BUN: 15 mg/dL (ref 8–23)
CO2: 22 mmol/L (ref 22–32)
Calcium: 8.3 mg/dL — ABNORMAL LOW (ref 8.9–10.3)
Chloride: 101 mmol/L (ref 98–111)
Creatinine, Ser: 0.98 mg/dL (ref 0.61–1.24)
GFR, Estimated: >60 mL/min (ref >60)
Glucose, Bld: 109 mg/dL — ABNORMAL HIGH (ref 70–99)
Potassium: 3.7 mmol/L (ref 3.5–5.1)
Sodium: 133 mmol/L — ABNORMAL LOW (ref 135–145)

## 2020-12-05 LAB — CBC
HCT: 34.6 % — ABNORMAL LOW (ref 39.0–52.0)
Hemoglobin: 11.6 g/dL — ABNORMAL LOW (ref 13.0–17.0)
MCH: 28.9 pg (ref 26.0–34.0)
MCHC: 33.5 g/dL (ref 30.0–36.0)
MCV: 86.1 fL (ref 80.0–100.0)
Platelets: 362 10*3/uL (ref 150–400)
RBC: 4.02 MIL/uL — ABNORMAL LOW (ref 4.22–5.81)
RDW: 13.9 % (ref 11.5–15.5)
WBC: 13.2 10*3/uL — ABNORMAL HIGH (ref 4.0–10.5)
nRBC: 0 % (ref 0.0–0.2)

## 2020-12-05 NOTE — Progress Notes (Signed)
1 Day Post-Op  Subjective: No complaints overnight, no flatus or BM  ROS: See above, otherwise other systems negative  Objective: Vital signs in last 24 hours: Temp:  [97.8 F (36.6 C)-99 F (37.2 C)] 98.6 F (37 C) (10/01 0830) Pulse Rate:  [84-100] 100 (10/01 0830) Resp:  [16-18] 18 (10/01 0830) BP: (130-167)/(75-99) 151/80 (10/01 0830) SpO2:  [94 %-99 %] 97 % (10/01 0830)    Intake/Output from previous day: 09/30 0701 - 10/01 0700 In: 2250.9 [I.V.:1747.5; IV Piggyback:503.4] Out: 400 [Urine:400] Intake/Output this shift: No intake/output data recorded.  PE: Abd: soft, appropriately tender, NGT in place with light bilious output, midline wound with some SS drainage noted on honeycomb  Lab Results:  Recent Labs    12/04/20 1110 12/05/20 0033  WBC 9.1 13.2*  HGB 13.3 11.6*  HCT 40.3 34.6*  PLT 443* 362    BMET Recent Labs    12/04/20 1110 12/05/20 0033  NA 135 133*  K 3.8 3.7  CL 100 101  CO2 25 22  GLUCOSE 195* 109*  BUN 12 15  CREATININE 1.02 0.98  CALCIUM 8.7* 8.3*    PT/INR No results for input(s): LABPROT, INR in the last 72 hours. CMP     Component Value Date/Time   NA 133 (L) 12/05/2020 0033   NA 139 12/27/2016 1237   K 3.7 12/05/2020 0033   K 4.2 12/27/2016 1237   CL 101 12/05/2020 0033   CO2 22 12/05/2020 0033   CO2 25 12/27/2016 1237   GLUCOSE 109 (H) 12/05/2020 0033   GLUCOSE 112 12/27/2016 1237   BUN 15 12/05/2020 0033   BUN 15.2 12/27/2016 1237   CREATININE 0.98 12/05/2020 0033   CREATININE 1.1 12/27/2016 1237   CALCIUM 8.3 (L) 12/05/2020 0033   CALCIUM 9.7 12/27/2016 1237   PROT 6.7 12/03/2020 2023   PROT 7.6 12/27/2016 1237   ALBUMIN 3.8 12/03/2020 2023   ALBUMIN 4.1 12/27/2016 1237   AST 15 12/03/2020 2023   AST 20 12/27/2016 1237   ALT 13 12/03/2020 2023   ALT 18 12/27/2016 1237   ALKPHOS 78 12/03/2020 2023   ALKPHOS 80 12/27/2016 1237   BILITOT 0.3 12/03/2020 2023   BILITOT 0.45 12/27/2016 1237   GFRNONAA  >60 12/05/2020 0033   GFRAA >60 11/20/2017 0948   Lipase     Component Value Date/Time   LIPASE 17 12/03/2020 2023       Studies/Results: CT ABDOMEN PELVIS W CONTRAST  Result Date: 12/03/2020 CLINICAL DATA:  Abdominal pain, fever, nausea, unspecified abdominal pain. EXAM: CT ABDOMEN AND PELVIS WITH CONTRAST TECHNIQUE: Multidetector CT imaging of the abdomen and pelvis was performed using the standard protocol following bolus administration of intravenous contrast. CONTRAST:  27mL OMNIPAQUE IOHEXOL 350 MG/ML SOLN COMPARISON:  11/16/2020 FINDINGS: Lower chest: No acute abnormality. Hepatobiliary: Punctate foci of free intraperitoneal gas are seen tracking within the biliary hilum. Simple cyst noted within the right hepatic dome. The liver is otherwise unremarkable. Gallbladder unremarkable. No intra or extrahepatic biliary ductal dilation. Pancreas: Unremarkable Spleen: Unremarkable Adrenals/Urinary Tract: The adrenal glands are unremarkable. The kidneys are normal in size and position. Scattered hypodensities are seen which are too small to accurately characterize on this examination but are stable since prior examination and may simply represent tiny cortical cysts are again noted. The kidneys are otherwise unremarkable. The bladder is unremarkable. Stomach/Bowel: Since the prior examination there has developed free intraperitoneal gas with punctate foci of gas seen within the perihepatic and right subdiaphragmatic  region as well as tracking within the biliary hilum. The exact source of visceral perforation is not clearly identified on this examination, however, the location favors a proximal enteric source as can be seen with gastric or duodenal ulceration. There is mild free fluid within the abdomen. Mild sigmoid diverticulosis noted. The stomach, small bowel, and large bowel are otherwise unremarkable. The appendix is not clearly identified. No evidence of obstruction. Vascular/Lymphatic: Extensive  aortoiliac atherosclerotic calcification. No aortic aneurysm. No pathologic adenopathy within the abdomen and pelvis. Reproductive: Unremarkable Other: No abdominal wall hernia. Musculoskeletal: No acute bone abnormality. No lytic or blastic bone lesion. IMPRESSION: Interval development of mild ascites and punctate foci of extraluminal gas within the right upper quadrant and biliary hilum. While the exact site of enteric perforation is not identified, a proximal source as can be seen with gastric or duodenal ulceration is suspected given the location of extraluminal gas. Aortic Atherosclerosis (ICD10-I70.0). Electronically Signed   By: Fidela Salisbury M.D.   On: 12/03/2020 22:10    Anti-infectives: Anti-infectives (From admission, onward)    Start     Dose/Rate Route Frequency Ordered Stop   12/04/20 0700  piperacillin-tazobactam (ZOSYN) IVPB 3.375 g        3.375 g 12.5 mL/hr over 240 Minutes Intravenous Every 8 hours 12/04/20 0218     12/03/20 2315  piperacillin-tazobactam (ZOSYN) IVPB 3.375 g        3.375 g 100 mL/hr over 30 Minutes Intravenous  Once 12/03/20 2308 12/04/20 0002        Assessment/Plan  POD 1, s/p ex lap with SBR for focal ischemic perforation of unclear etiology, Dr. Zenia Resides 12/04/20 -cont NGT for now, await return of bowel function -mobilize, pulm toilet -multi-modal pain control     FEN - IVFs, NPO/NGT VTE - lovenox ID - [email or web address]   LOS: 1 day    Rosario Adie, MD  Colorectal [facility]

## 2020-12-06 MED ORDER — HYDROCHLOROTHIAZIDE 25 MG PO TABS: 25.0000 mg | ORAL_TABLET | Freq: Every day | ORAL | Status: DC

## 2020-12-06 MED ORDER — MELATONIN 5 MG PO TABS
5.0000 mg | ORAL_TABLET | Freq: Every day | ORAL | Status: DC
  Filled 2020-12-06 (×8): qty 1

## 2020-12-06 MED ORDER — HYDROCHLOROTHIAZIDE 25 MG PO TABS
25.0000 mg | ORAL_TABLET | Freq: Every day | ORAL | Status: AC
  Administered 2020-12-07 – 2020-12-12 (×6): 25 mg
  Filled 2020-12-06 (×7): qty 1

## 2020-12-06 NOTE — Plan of Care (Signed)

## 2020-12-06 NOTE — Progress Notes (Signed)
    2 Days Post-Op  Subjective: No complaints overnight, no flatus or BM, pain controlled   Objective: Vital signs in last 24 hours: Temp:  [98.4 F (36.9 C)-100 F (37.8 C)] 99 F (37.2 C) (10/02 0958) Pulse Rate:  [100-107] 107 (10/02 0958) Resp:  [17-19] 19 (10/02 0958) BP: (143-156)/(79-96) 146/90 (10/02 0958) SpO2:  [95 %-99 %] 97 % (10/02 0958)    Intake/Output from previous day: 10/01 0701 - 10/02 0700 In: 2431.1 [I.V.:2094.4; NG/GT:40; IV Piggyback:296.7] Out: 1250 [Urine:750; Emesis/NG output:500] Intake/Output this shift: No intake/output data recorded.  PE: Abd: soft, appropriately tender, NGT in place with light bilious output, midline wound with some SS drainage noted on honeycomb  Lab Results:  Recent Labs    12/04/20 1110 12/05/20 0033  WBC 9.1 13.2*  HGB 13.3 11.6*  HCT 40.3 34.6*  PLT 443* 362    BMET Recent Labs    12/04/20 1110 12/05/20 0033  NA 135 133*  K 3.8 3.7  CL 100 101  CO2 25 22  GLUCOSE 195* 109*  BUN 12 15  CREATININE 1.02 0.98  CALCIUM 8.7* 8.3*    PT/INR No results for input(s): LABPROT, INR in the last 72 hours. CMP     Component Value Date/Time   NA 133 (L) 12/05/2020 0033   NA 139 12/27/2016 1237   K 3.7 12/05/2020 0033   K 4.2 12/27/2016 1237   CL 101 12/05/2020 0033   CO2 22 12/05/2020 0033   CO2 25 12/27/2016 1237   GLUCOSE 109 (H) 12/05/2020 0033   GLUCOSE 112 12/27/2016 1237   BUN 15 12/05/2020 0033   BUN 15.2 12/27/2016 1237   CREATININE 0.98 12/05/2020 0033   CREATININE 1.1 12/27/2016 1237   CALCIUM 8.3 (L) 12/05/2020 0033   CALCIUM 9.7 12/27/2016 1237   PROT 6.7 12/03/2020 2023   PROT 7.6 12/27/2016 1237   ALBUMIN 3.8 12/03/2020 2023   ALBUMIN 4.1 12/27/2016 1237   AST 15 12/03/2020 2023   AST 20 12/27/2016 1237   ALT 13 12/03/2020 2023   ALT 18 12/27/2016 1237   ALKPHOS 78 12/03/2020 2023   ALKPHOS 80 12/27/2016 1237   BILITOT 0.3 12/03/2020 2023   BILITOT 0.45 12/27/2016 1237   GFRNONAA  >60 12/05/2020 0033   GFRAA >60 11/20/2017 0948   Lipase     Component Value Date/Time   LIPASE 17 12/03/2020 2023       Studies/Results: No results found.  Anti-infectives: Anti-infectives (From admission, onward)    Start     Dose/Rate Route Frequency Ordered Stop   12/04/20 0700  piperacillin-tazobactam (ZOSYN) IVPB 3.375 g        3.375 g 12.5 mL/hr over 240 Minutes Intravenous Every 8 hours 12/04/20 0218     12/03/20 2315  piperacillin-tazobactam (ZOSYN) IVPB 3.375 g        3.375 g 100 mL/hr over 30 Minutes Intravenous  Once 12/03/20 2308 12/04/20 0002        Assessment/Plan  POD 2, s/p ex lap with SBR for focal ischemic perforation of unclear etiology, Dr. Zenia Resides 12/04/20 -cont NGT for now, await return of bowel function -mobilize, pulm toilet -multi-modal pain control     FEN - IVFs, NPO/NGT VTE - lovenox ID - [email or web address]  Repeat labs in AM  LOS: 2 days    Rosario Adie, MD  Colorectal and [facility]

## 2020-12-06 NOTE — Progress Notes (Signed)
Wife, Jeanine Luz [phone] called and concerned about pt BP. Pt takes HCTZ 25 mg daily at home. Dr. Dema Severin contacted and order received. While pt has NGT, order to clamp NGT for 1 hour after administration of HCTZ. Obtained gas chair for pt to use tomorrow and put in the room, explained to pt.

## 2020-12-07 LAB — CBC
HCT: 32.8 % — ABNORMAL LOW (ref 39.0–52.0)
Hemoglobin: 11.4 g/dL — ABNORMAL LOW (ref 13.0–17.0)
MCH: 28.9 pg (ref 26.0–34.0)
MCHC: 34.8 g/dL (ref 30.0–36.0)
MCV: 83.2 fL (ref 80.0–100.0)
Platelets: 299 10*3/uL (ref 150–400)
RBC: 3.94 MIL/uL — ABNORMAL LOW (ref 4.22–5.81)
RDW: 13.8 % (ref 11.5–15.5)
WBC: 15.5 10*3/uL — ABNORMAL HIGH (ref 4.0–10.5)
nRBC: 0 % (ref 0.0–0.2)

## 2020-12-07 LAB — BASIC METABOLIC PANEL
Anion gap: 8 (ref 5–15)
BUN: 9 mg/dL (ref 8–23)
CO2: 21 mmol/L — ABNORMAL LOW (ref 22–32)
Calcium: 8.3 mg/dL — ABNORMAL LOW (ref 8.9–10.3)
Chloride: 100 mmol/L (ref 98–111)
Creatinine, Ser: 0.75 mg/dL (ref 0.61–1.24)
GFR, Estimated: >60 mL/min (ref >60)
Glucose, Bld: 103 mg/dL — ABNORMAL HIGH (ref 70–99)
Potassium: 3.4 mmol/L — ABNORMAL LOW (ref 3.5–5.1)
Sodium: 129 mmol/L — ABNORMAL LOW (ref 135–145)

## 2020-12-07 LAB — SURGICAL PATHOLOGY

## 2020-12-07 MED ORDER — ACETAMINOPHEN 10 MG/ML IV SOLN
1000.0000 mg | Freq: Four times a day (QID) | INTRAVENOUS | Status: AC
  Administered 2020-12-07 – 2020-12-08 (×4): 1000 mg via INTRAVENOUS
  Filled 2020-12-07 (×4): qty 100

## 2020-12-07 MED ORDER — METHOCARBAMOL 1000 MG/10ML IJ SOLN
1000.0000 mg | Freq: Three times a day (TID) | INTRAVENOUS | Status: DC
  Administered 2020-12-07 – 2020-12-18 (×31): 1000 mg via INTRAVENOUS
  Filled 2020-12-07: qty 10
  Filled 2020-12-07 (×2): qty 1000
  Filled 2020-12-07: qty 10
  Filled 2020-12-07: qty 1000
  Filled 2020-12-07 (×4): qty 10
  Filled 2020-12-07 (×3): qty 1000
  Filled 2020-12-07 (×3): qty 10
  Filled 2020-12-07 (×2): qty 1000
  Filled 2020-12-07 (×3): qty 10
  Filled 2020-12-07: qty 1000
  Filled 2020-12-07 (×2): qty 10
  Filled 2020-12-07: qty 1000
  Filled 2020-12-07 (×2): qty 10
  Filled 2020-12-07: qty 1000
  Filled 2020-12-07 (×3): qty 10
  Filled 2020-12-07 (×4): qty 1000

## 2020-12-07 NOTE — Progress Notes (Signed)
Progress Note  3 Days Post-Op  Subjective: Patient reports generalized pain with coughing and mobilization. No flatus or BM yet. Has been up ambulating and sitting in rocking chair.   Objective: Vital signs in last 24 hours: Temp:  [99.2 F (37.3 C)-99.8 F (37.7 C)] 99.3 F (37.4 C) (10/03 0839) Pulse Rate:  [98-103] 98 (10/03 0839) Resp:  [16-18] 18 (10/03 0839) BP: (148-162)/(91-105) 152/92 (10/03 0839) SpO2:  [95 %-97 %] 96 % (10/03 0839)    Intake/Output from previous day: 10/02 0701 - 10/03 0700 In: 1150 [P.O.:50; I.V.:1000; IV Piggyback:100] Out: 750 [Urine:750] Intake/Output this shift: No intake/output data recorded.  PE: General: pleasant, WD, thin male who is laying in bed in NAD Heart: regular, rate, and rhythm.  Lungs:  Respiratory effort nonlabored Abd: soft, appropriately ttp, ND, BS hypoactive, incision clean and dry with staples present, NGT with bilious output Skin: warm and dry with no masses, lesions, or rashes Neuro: Cranial nerves 2-12 grossly intact, sensation is normal throughout Psych: A&Ox3 with an appropriate affect.    Lab Results:  Recent Labs    12/05/20 0033 12/07/20 0406  WBC 13.2* 15.5*  HGB 11.6* 11.4*  HCT 34.6* 32.8*  PLT 362 299   BMET Recent Labs    12/05/20 0033 12/07/20 0406  NA 133* 129*  K 3.7 3.4*  CL 101 100  CO2 22 21*  GLUCOSE 109* 103*  BUN 15 9  CREATININE 0.98 0.75  CALCIUM 8.3* 8.3*   PT/INR No results for input(s): LABPROT, INR in the last 72 hours. CMP     Component Value Date/Time   NA 129 (L) 12/07/2020 0406   NA 139 12/27/2016 1237   K 3.4 (L) 12/07/2020 0406   K 4.2 12/27/2016 1237   CL 100 12/07/2020 0406   CO2 21 (L) 12/07/2020 0406   CO2 25 12/27/2016 1237   GLUCOSE 103 (H) 12/07/2020 0406   GLUCOSE 112 12/27/2016 1237   BUN 9 12/07/2020 0406   BUN 15.2 12/27/2016 1237   CREATININE 0.75 12/07/2020 0406   CREATININE 1.1 12/27/2016 1237   CALCIUM 8.3 (L) 12/07/2020 0406   CALCIUM  9.7 12/27/2016 1237   PROT 6.7 12/03/2020 2023   PROT 7.6 12/27/2016 1237   ALBUMIN 3.8 12/03/2020 2023   ALBUMIN 4.1 12/27/2016 1237   AST 15 12/03/2020 2023   AST 20 12/27/2016 1237   ALT 13 12/03/2020 2023   ALT 18 12/27/2016 1237   ALKPHOS 78 12/03/2020 2023   ALKPHOS 80 12/27/2016 1237   BILITOT 0.3 12/03/2020 2023   BILITOT 0.45 12/27/2016 1237   GFRNONAA >60 12/07/2020 0406   GFRAA >60 11/20/2017 0948   Lipase     Component Value Date/Time   LIPASE 17 12/03/2020 2023       Studies/Results: No results found.  Anti-infectives: Anti-infectives (From admission, onward)    Start     Dose/Rate Route Frequency Ordered Stop   12/04/20 0700  piperacillin-tazobactam (ZOSYN) IVPB 3.375 g        3.375 g 12.5 mL/hr over 240 Minutes Intravenous Every 8 hours 12/04/20 0218     12/03/20 2315  piperacillin-tazobactam (ZOSYN) IVPB 3.375 g        3.375 g 100 mL/hr over 30 Minutes Intravenous  Once 12/03/20 2308 12/04/20 0002        Assessment/Plan POD3, s/p ex lap with SBR for focal ischemic perforation of unclear etiology, Dr. Zenia Resides 12/04/20 - cont NGT on LIWS, await return of bowel function - if  not opening up in the next 1-2 days will consider starting PICC/TPN - mobilize, pulm toilet - multi-modal pain control - increased IV robaxin and added IV tylenol today - WBC 15, repeat CBC in AM - will need to consider CT AP if not opening up in the next day or two and WBC increasing, high risk for post-op abscess given bowel perf     FEN - IVFs, NPO/NGT VTE - lovenox ID - zosyn  Hx of rectal adenocarcinoma Hx of DVT HTN ABL on chronic anemia - hgb stable at 11.4  LOS: 3 days    Norm Parcel, [facility] Surgery 12/07/2020, 11:04 AM Please see Amion for pager number during day hours 7:00am-4:30pm

## 2020-12-08 ENCOUNTER — Telehealth: Payer: Self-pay | Admitting: Hematology

## 2020-12-08 LAB — BASIC METABOLIC PANEL
Anion gap: 9 (ref 5–15)
BUN: 9 mg/dL (ref 8–23)
CO2: 24 mmol/L (ref 22–32)
Calcium: 8.3 mg/dL — ABNORMAL LOW (ref 8.9–10.3)
Chloride: 96 mmol/L — ABNORMAL LOW (ref 98–111)
Creatinine, Ser: 0.73 mg/dL (ref 0.61–1.24)
GFR, Estimated: >60 mL/min (ref >60)
Glucose, Bld: 103 mg/dL — ABNORMAL HIGH (ref 70–99)
Potassium: 2.9 mmol/L — ABNORMAL LOW (ref 3.5–5.1)
Sodium: 129 mmol/L — ABNORMAL LOW (ref 135–145)

## 2020-12-08 LAB — CBC
HCT: 31 % — ABNORMAL LOW (ref 39.0–52.0)
Hemoglobin: 10.5 g/dL — ABNORMAL LOW (ref 13.0–17.0)
MCH: 28.2 pg (ref 26.0–34.0)
MCHC: 33.9 g/dL (ref 30.0–36.0)
MCV: 83.3 fL (ref 80.0–100.0)
Platelets: 301 10*3/uL (ref 150–400)
RBC: 3.72 MIL/uL — ABNORMAL LOW (ref 4.22–5.81)
RDW: 13.7 % (ref 11.5–15.5)
WBC: 14.2 10*3/uL — ABNORMAL HIGH (ref 4.0–10.5)
nRBC: 0 % (ref 0.0–0.2)

## 2020-12-08 LAB — MAGNESIUM: Magnesium: 1.9 mg/dL (ref 1.7–2.4)

## 2020-12-08 MED ORDER — LIDOCAINE 5 % EX PTCH
1.0000 | MEDICATED_PATCH | CUTANEOUS | Status: DC
  Administered 2020-12-08 – 2020-12-15 (×8): 1 via TRANSDERMAL
  Filled 2020-12-08 (×8): qty 1

## 2020-12-08 MED ORDER — ACETAMINOPHEN 10 MG/ML IV SOLN
1000.0000 mg | Freq: Four times a day (QID) | INTRAVENOUS | Status: AC
  Administered 2020-12-08 – 2020-12-09 (×3): 1000 mg via INTRAVENOUS
  Filled 2020-12-08 (×3): qty 100

## 2020-12-08 MED ORDER — POTASSIUM CHLORIDE 10 MEQ/100ML IV SOLN
10.0000 meq | INTRAVENOUS | Status: AC
Start: 2020-12-08 — End: 2020-12-09
  Administered 2020-12-08 (×6): 10 meq via INTRAVENOUS
  Filled 2020-12-08 (×5): qty 100

## 2020-12-08 MED ORDER — SODIUM CHLORIDE 0.9 % IV SOLN: INTRAVENOUS | Status: AC

## 2020-12-08 NOTE — Consult Note (Signed)
   [facility] Inpatient Consult   12/08/2020  MERL BOMMARITO January 30, 1956 [id number]   [employer] [ACO] Patient: [facility] plan  Patient is currently assigned to a [place] Coordinator for the [place].  Patient will receive a post hospital call and will be evaluated for assessments and disease process education.    Met with patient at the bedside to discuss post hospital follow up needs.  He voiced concerns for FMLA, ongoing PAL leave, etc. He states his wife, Jeanine Luz, also has permission and can be contacted as well her number is [phone].   Plan: Continue to follow for progress and update [facility] RN CC about post hospital follow up concerns.   For additional questions or referrals please contact:   Natividad Brood, RN BSN [facility] Liaison  [phone] business mobile phone Toll free office [phone]  Fax number: [phone] [email or web address]

## 2020-12-08 NOTE — Telephone Encounter (Signed)
Scheduled appt per 9/30 staff msg from Dr. Burr Medico. Called pt, no answer. Left msg with appt date and time and call back number to confirm appt.

## 2020-12-08 NOTE — Progress Notes (Signed)
Progress Note  4 Days Post-Op  Subjective: States he feels about the same as yeterday - pain with coughing. Denies flatus or BM. Denies fever/chills. Did not mobilize yesterday, says this was not by choice but the staff were busy, they got him to chair at 5pm. Wife at bedside.  AFVSS Objective: Vital signs in last 24 hours: Temp:  [98.3 F (36.8 C)-98.9 F (37.2 C)] 98.3 F (36.8 C) (10/04 0758) Pulse Rate:  [85-96] 96 (10/04 0758) Resp:  [16-18] 17 (10/04 0758) BP: (143-155)/(93-96) 155/96 (10/04 0758) SpO2:  [97 %-98 %] 98 % (10/04 0758)    Intake/Output from previous day: 10/03 0701 - 10/04 0700 In: 1360.5 [P.O.:60; I.V.:1098; IV Piggyback:202.5] Out: 950 [Urine:600; Emesis/NG output:350] Intake/Output this shift: No intake/output data recorded.  PE: General: pleasant, WD, thin male who is laying in bed in NAD Lungs:  Respiratory effort nonlabored Abd: soft, appropriately ttp, ND, BS hypoactive, incision clean and dry with staples present, NGT with thick bilious output Skin: warm and dry with no masses, lesions, or rashes Psych: A&Ox3 with an appropriate affect.    Lab Results:  Recent Labs    12/07/20 0406 12/08/20 0115  WBC 15.5* 14.2*  HGB 11.4* 10.5*  HCT 32.8* 31.0*  PLT 299 301   BMET Recent Labs    12/07/20 0406 12/08/20 0115  NA 129* 129*  K 3.4* 2.9*  CL 100 96*  CO2 21* 24  GLUCOSE 103* 103*  BUN 9 9  CREATININE 0.75 0.73  CALCIUM 8.3* 8.3*   PT/INR No results for input(s): LABPROT, INR in the last 72 hours. CMP     Component Value Date/Time   NA 129 (L) 12/08/2020 0115   NA 139 12/27/2016 1237   K 2.9 (L) 12/08/2020 0115   K 4.2 12/27/2016 1237   CL 96 (L) 12/08/2020 0115   CO2 24 12/08/2020 0115   CO2 25 12/27/2016 1237   GLUCOSE 103 (H) 12/08/2020 0115   GLUCOSE 112 12/27/2016 1237   BUN 9 12/08/2020 0115   BUN 15.2 12/27/2016 1237   CREATININE 0.73 12/08/2020 0115   CREATININE 1.1 12/27/2016 1237   CALCIUM 8.3 (L)  12/08/2020 0115   CALCIUM 9.7 12/27/2016 1237   PROT 6.7 12/03/2020 2023   PROT 7.6 12/27/2016 1237   ALBUMIN 3.8 12/03/2020 2023   ALBUMIN 4.1 12/27/2016 1237   AST 15 12/03/2020 2023   AST 20 12/27/2016 1237   ALT 13 12/03/2020 2023   ALT 18 12/27/2016 1237   ALKPHOS 78 12/03/2020 2023   ALKPHOS 80 12/27/2016 1237   BILITOT 0.3 12/03/2020 2023   BILITOT 0.45 12/27/2016 1237   GFRNONAA >60 12/08/2020 0115   GFRAA >60 11/20/2017 0948   Lipase     Component Value Date/Time   LIPASE 17 12/03/2020 2023       Studies/Results: No results found.  Anti-infectives: Anti-infectives (From admission, onward)    Start     Dose/Rate Route Frequency Ordered Stop   12/04/20 0700  piperacillin-tazobactam (ZOSYN) IVPB 3.375 g        3.375 g 12.5 mL/hr over 240 Minutes Intravenous Every 8 hours 12/04/20 0218     12/03/20 2315  piperacillin-tazobactam (ZOSYN) IVPB 3.375 g        3.375 g 100 mL/hr over 30 Minutes Intravenous  Once 12/03/20 2308 12/04/20 0002        Assessment/Plan POD4, s/p ex lap with SBR for focal ischemic perforation of unclear etiology, Dr. Zenia Resides 12/04/20 - afebrile, WBC 13 >  15 >14 stable but not downtrending  - postop ileus: cont NGT on LIWS, await return of bowel function  - mobilize, pulm toilet - multi-modal pain control - IV robaxin, IV tylenol today, add lidoderm patch - high suspicion patient is developing post-operative abscess given ileus and persistent leukocytosis. Will need repeat CT abdomen and pelvis to rule this out - will discuss timing of this study with my attending, today vs tomorrow.      FEN - IVFs, NPO/NGT, will plan to start PICC/TPN tomorrow 10/5 if ileus persists  VTE - lovenox ID - zosyn  Hx of rectal adenocarcinoma Hx of DVT HTN ABL on chronic anemia - hgb stable at 11.4  LOS: 4 days    Jill Alexanders, [facility] Surgery 12/08/2020, 11:52 AM Please see Amion for pager number during day hours 7:00am-4:30pm

## 2020-12-09 ENCOUNTER — Encounter: Payer: 59 | Admitting: Internal Medicine

## 2020-12-09 ENCOUNTER — Inpatient Hospital Stay (HOSPITAL_COMMUNITY): Payer: 59

## 2020-12-09 ENCOUNTER — Inpatient Hospital Stay: Payer: Self-pay

## 2020-12-09 LAB — BASIC METABOLIC PANEL
Anion gap: 10 (ref 5–15)
BUN: 11 mg/dL (ref 8–23)
CO2: 23 mmol/L (ref 22–32)
Calcium: 8.4 mg/dL — ABNORMAL LOW (ref 8.9–10.3)
Chloride: 98 mmol/L (ref 98–111)
Creatinine, Ser: 0.78 mg/dL (ref 0.61–1.24)
GFR, Estimated: >60 mL/min (ref >60)
Glucose, Bld: 73 mg/dL (ref 70–99)
Potassium: 3.3 mmol/L — ABNORMAL LOW (ref 3.5–5.1)
Sodium: 131 mmol/L — ABNORMAL LOW (ref 135–145)

## 2020-12-09 LAB — GLUCOSE, CAPILLARY
Glucose-Capillary: 114 mg/dL — ABNORMAL HIGH (ref 70–99)
Glucose-Capillary: 69 mg/dL — ABNORMAL LOW (ref 70–99)
Glucose-Capillary: 91 mg/dL (ref 70–99)

## 2020-12-09 LAB — CBC
HCT: 32 % — ABNORMAL LOW (ref 39.0–52.0)
Hemoglobin: 11 g/dL — ABNORMAL LOW (ref 13.0–17.0)
MCH: 28.9 pg (ref 26.0–34.0)
MCHC: 34.4 g/dL (ref 30.0–36.0)
MCV: 84.2 fL (ref 80.0–100.0)
Platelets: 339 10*3/uL (ref 150–400)
RBC: 3.8 MIL/uL — ABNORMAL LOW (ref 4.22–5.81)
RDW: 13.7 % (ref 11.5–15.5)
WBC: 14.7 10*3/uL — ABNORMAL HIGH (ref 4.0–10.5)
nRBC: 0 % (ref 0.0–0.2)

## 2020-12-09 LAB — HEMOGLOBIN A1C
Hgb A1c MFr Bld: 5.9 % — ABNORMAL HIGH (ref 4.8–5.6)
Mean Plasma Glucose: 122.63 mg/dL

## 2020-12-09 MED ORDER — TRAVASOL 10 % IV SOLN
INTRAVENOUS | Status: AC
  Filled 2020-12-09: qty 528

## 2020-12-09 MED ORDER — IOHEXOL 9 MG/ML PO SOLN
ORAL | Status: AC
  Administered 2020-12-09: 500 mL
  Filled 2020-12-09: qty 1000

## 2020-12-09 MED ORDER — SODIUM CHLORIDE 0.9% FLUSH
10.0000 mL | Freq: Two times a day (BID) | INTRAVENOUS | Status: DC
  Administered 2020-12-09 – 2020-12-17 (×9): 10 mL

## 2020-12-09 MED ORDER — SODIUM CHLORIDE 0.9 % IV SOLN: INTRAVENOUS | Status: DC

## 2020-12-09 MED ORDER — SODIUM CHLORIDE 0.9% FLUSH
10.0000 mL | INTRAVENOUS | Status: DC | PRN
  Administered 2020-12-15: 20 mL

## 2020-12-09 MED ORDER — INSULIN ASPART 100 UNIT/ML IJ SOLN
0.0000 [IU] | INTRAMUSCULAR | Status: DC
  Administered 2020-12-10 (×2): 1 [IU] via SUBCUTANEOUS
  Administered 2020-12-10: 2 [IU] via SUBCUTANEOUS
  Administered 2020-12-11: 1 [IU] via SUBCUTANEOUS
  Administered 2020-12-11: 2 [IU] via SUBCUTANEOUS
  Administered 2020-12-11: 1 [IU] via SUBCUTANEOUS
  Administered 2020-12-11: 2 [IU] via SUBCUTANEOUS
  Administered 2020-12-12 (×2): 1 [IU] via SUBCUTANEOUS

## 2020-12-09 MED ORDER — CHLORHEXIDINE GLUCONATE CLOTH 2 % EX PADS
6.0000 | MEDICATED_PAD | Freq: Every day | CUTANEOUS | Status: DC
  Administered 2020-12-09 – 2020-12-21 (×12): 6 via TOPICAL

## 2020-12-09 MED ORDER — IOHEXOL 350 MG/ML SOLN
80.0000 mL | Freq: Once | INTRAVENOUS | Status: AC | PRN
  Administered 2020-12-09: 80 mL via INTRAVENOUS

## 2020-12-09 MED ORDER — POTASSIUM CHLORIDE 10 MEQ/100ML IV SOLN
10.0000 meq | INTRAVENOUS | Status: AC
  Administered 2020-12-09 (×4): 10 meq via INTRAVENOUS
  Filled 2020-12-09 (×4): qty 100

## 2020-12-09 MED ORDER — INSULIN ASPART 100 UNIT/ML IJ SOLN: 0.0000 [IU] | INTRAMUSCULAR | Status: DC

## 2020-12-09 NOTE — Progress Notes (Signed)
Progress Note  5 Days Post-Op  Subjective: NAEO. Feels like his pain is slightly improved, allowing him to move a round a bit easier.  denies flatus/BM.  AFVSS  WBC 14 Objective: Vital signs in last 24 hours: Temp:  [98.3 F (36.8 C)-99.1 F (37.3 C)] 99.1 F (37.3 C) (10/05 0642) Pulse Rate:  [88-96] 95 (10/05 0642) Resp:  [16-17] 17 (10/05 0642) BP: (145-155)/(90-96) 148/95 (10/05 0642) SpO2:  [96 %-99 %] 98 % (10/05 0642)    Intake/Output from previous day: 10/04 0701 - 10/05 0700 In: 2027.8 [P.O.:30; I.V.:955; IV Piggyback:1042.8] Out: 1000 [Urine:600; Emesis/NG output:400] Intake/Output this shift: No intake/output data recorded.  PE: General: pleasant, WD, thin male who is laying in bed in NAD Lungs:  Respiratory effort nonlabored Abd: soft, appropriately ttp, ND, BS hypoactive, incision clean and dry with staples present, NGT with thick bilious output Skin: warm and dry with no masses, lesions, or rashes Psych: A&Ox3 with an appropriate affect.    Lab Results:  Recent Labs    12/08/20 0115 12/09/20 0114  WBC 14.2* 14.7*  HGB 10.5* 11.0*  HCT 31.0* 32.0*  PLT 301 339   BMET Recent Labs    12/08/20 0115 12/09/20 0114  NA 129* 131*  K 2.9* 3.3*  CL 96* 98  CO2 24 23  GLUCOSE 103* 73  BUN 9 11  CREATININE 0.73 0.78  CALCIUM 8.3* 8.4*   PT/INR No results for input(s): LABPROT, INR in the last 72 hours. CMP     Component Value Date/Time   NA 131 (L) 12/09/2020 0114   NA 139 12/27/2016 1237   K 3.3 (L) 12/09/2020 0114   K 4.2 12/27/2016 1237   CL 98 12/09/2020 0114   CO2 23 12/09/2020 0114   CO2 25 12/27/2016 1237   GLUCOSE 73 12/09/2020 0114   GLUCOSE 112 12/27/2016 1237   BUN 11 12/09/2020 0114   BUN 15.2 12/27/2016 1237   CREATININE 0.78 12/09/2020 0114   CREATININE 1.1 12/27/2016 1237   CALCIUM 8.4 (L) 12/09/2020 0114   CALCIUM 9.7 12/27/2016 1237   PROT 6.7 12/03/2020 2023   PROT 7.6 12/27/2016 1237   ALBUMIN 3.8 12/03/2020  2023   ALBUMIN 4.1 12/27/2016 1237   AST 15 12/03/2020 2023   AST 20 12/27/2016 1237   ALT 13 12/03/2020 2023   ALT 18 12/27/2016 1237   ALKPHOS 78 12/03/2020 2023   ALKPHOS 80 12/27/2016 1237   BILITOT 0.3 12/03/2020 2023   BILITOT 0.45 12/27/2016 1237   GFRNONAA >60 12/09/2020 0114   GFRAA >60 11/20/2017 0948   Lipase     Component Value Date/Time   LIPASE 17 12/03/2020 2023       Studies/Results: No results found.  Anti-infectives: Anti-infectives (From admission, onward)    Start     Dose/Rate Route Frequency Ordered Stop   12/04/20 0700  piperacillin-tazobactam (ZOSYN) IVPB 3.375 g        3.375 g 12.5 mL/hr over 240 Minutes Intravenous Every 8 hours 12/04/20 0218     12/03/20 2315  piperacillin-tazobactam (ZOSYN) IVPB 3.375 g        3.375 g 100 mL/hr over 30 Minutes Intravenous  Once 12/03/20 2308 12/04/20 0002        Assessment/Plan POD4, s/p ex lap with SBR for focal ischemic perforation of unclear etiology, Dr. Zenia Resides 12/04/20 - afebrile, WBC 13 > 15 >14 >14 stable but not downtrending  - postop ileus: cont NGT on LIWS, await return of bowel function  -  mobilize, pulm toilet - multi-modal pain control - IV robaxin, IV tylenol today, add lidoderm patch - high suspicion patient is developing post-operative abscess given ileus and persistent leukocytosis. Repeat CT abdomen and pelvis today to evaluate.   FEN - IVFs, NPO/NGT,  PICC/TPN today VTE - lovenox ID - zosyn  Hx of rectal adenocarcinoma Hx of DVT HTN ABL on chronic anemia - hgb stable at 11.4  LOS: 5 days    Jill Alexanders, [facility] Surgery 12/09/2020, 7:55 AM Please see Amion for pager number during day hours 7:00am-4:30pm

## 2020-12-09 NOTE — Progress Notes (Signed)
Peripherally Inserted Central Catheter Placement  The IV Nurse has discussed with the patient and/or persons authorized to consent for the patient, the purpose of this procedure and the potential benefits and risks involved with this procedure.  The benefits include less needle sticks, lab draws from the catheter, and the patient may be discharged home with the catheter. Risks include, but not limited to, infection, bleeding, blood clot (thrombus formation), and puncture of an artery; nerve damage and irregular heartbeat and possibility to perform a PICC exchange if needed/ordered by physician.  Alternatives to this procedure were also discussed.  Bard Power PICC patient education guide, fact sheet on infection prevention and patient information card has been provided to patient /or left at bedside.    PICC Placement Documentation  PICC Double Lumen 12/09/20 PICC Right Brachial 39 cm 0 cm (Active)  Indication for Insertion or Continuance of Line Administration of hyperosmolar/irritating solutions (i.e. TPN, Vancomycin, etc.) 12/09/20 0900  Exposed Catheter (cm) 0 cm 12/09/20 0900  Site Assessment Clean;Dry;Intact 12/09/20 0900  Lumen #1 Status Flushed;Blood return noted;Saline locked 12/09/20 0900  Lumen #2 Status Flushed;Blood return noted;Saline locked 12/09/20 0900  Dressing Type Transparent 12/09/20 0900  Dressing Status Clean;Intact;Dry 12/09/20 0900  Antimicrobial disc in place? Yes 12/09/20 0900  Dressing Change Due 12/16/20 12/09/20 0900       Scotty Court 12/09/2020, 9:36 AM

## 2020-12-09 NOTE — Progress Notes (Signed)
Initial Nutrition Assessment  DOCUMENTATION CODES:   Severe malnutrition in context of acute illness/injury, Underweight  INTERVENTION:   - TPN management per Pharmacy  Monitor magnesium, potassium, and phosphorus daily for at least 3 days, MD to replete as needed, as pt is at risk for refeeding syndrome given severe malnutrition, NPO x 5 days.  NUTRITION DIAGNOSIS:   Severe Malnutrition related to acute illness (pneumoperitoneum and ischemic bowel perforation) as evidenced by severe muscle depletion, severe fat depletion, percent weight loss (7.4% weight loss in less than 1 month).  GOAL:   Patient will meet greater than or equal to 90% of their needs  MONITOR:   Diet advancement, Labs, Weight trends, I & O's  REASON FOR ASSESSMENT:   Consult New TPN/TNA  ASSESSMENT:   65 year old male who presented to the ED on 9/29 with nausea and abdominal pain x 1 month. PMH of rectal cancer s/p excision via transanal biopsy in 2018 and chemoradiation, HTN, anemia, DVT. Pt admitted with pneumoperitoneum and focal ischemic perforation of unclear etiology.  9/30 - s/p ex-lap, SBR, primary reanastomosis  RD consulted for TPN. Pt with post-op ileus. NG tube in place to low intermittent suction. TPN to start today at 40 ml/hr.  Spoke with pt and wife at bedside. They report that pt has been having issues with nausea and severe abdominal pain for 4-6 weeks. Pt reports pain with eating. During this time, pt has been eating smaller portions compared to what he normally eats. Pt reports that he went to the ED and was told he had constipation. He started taking miralax and has since been having significant diarrhea.  Pt endorses weight loss of ~10 lbs that has occurred over the last several weeks. He reports a UBW of 135 lbs over the last few years. Pt and pt's wife state that pt has always been very lean/slim. Pt states that his highest weight was ~155 lbs many years ago. Pt's wife shares that pt  weighed 122 lbs on admission and is now up to ~126 lbs which she was told is due to fluid from surgery and IV fluids.  Reviewed weight history in chart. Pt with a weight loss of 4.5 kg since 11/16/20. This is a 7.4% weight loss in less than 1 month which is severe and significant for timeframe. Pt meets criteria for severe acute malnutrition. Based on severity of fat and muscle wasting, suspect pt with some degree of chronic malnutrition at baseline.  RD answered pt and pt's wife questions about TPN.  Medications reviewed and include: SSI q 4 hours, melatonin, IV protonix, IV acetaminophen, IV abx, IV KCl 10 mEq x 4 runs IVF: NS @ 75 ml/hr  Labs reviewed: sodium 131, potassium 3.3  UOP: 600 ml x 24 hours NGT: 400 ml x 24 hours I/O's: +6.2 L since admit  NUTRITION - FOCUSED PHYSICAL EXAM:  Flowsheet Row Most Recent Value  Orbital Region Severe depletion  Upper Arm Region Moderate depletion  Thoracic and Lumbar Region Moderate depletion  Buccal Region Severe depletion  Temple Region Severe depletion  Clavicle Bone Region Severe depletion  Clavicle and Acromion Bone Region Severe depletion  Scapular Bone Region Moderate depletion  Dorsal Hand Moderate depletion  Patellar Region Moderate depletion  Anterior Thigh Region Severe depletion  Posterior Calf Region Moderate depletion  Edema (RD Assessment) None  Hair Reviewed  Eyes Reviewed  Mouth Reviewed  Skin Reviewed  Nails Reviewed       Diet Order:   Diet Order  Diet NPO time specified Except for: Ice Chips  Diet effective now                   EDUCATION NEEDS:   Education needs have been addressed  Skin:  Skin Assessment: Skin Integrity Issues: Incisions: abdomen  Last BM:  no documented BM  Height:   Ht Readings from Last 1 Encounters:  12/01/20 5' 10.5" (1.791 m)    Weight:   Wt Readings from Last 1 Encounters:  12/09/20 56.7 kg    BMI:  17.51  Estimated Nutritional Needs:    Kcal:  1900-2100  Protein:  85-100 grams  Fluid:  >/= 1.8 L    Gustavus Bryant, MS, RD, LDN Inpatient Clinical Dietitian Please see AMiON for contact information.

## 2020-12-09 NOTE — Progress Notes (Signed)
PHARMACY - TOTAL PARENTERAL NUTRITION CONSULT NOTE   Indication: Prolonged ileus  Patient Measurements:     There is no height or weight on file to calculate BMI. Usual Weight:    Assessment: 65 y/o M with h/o rectal adenocarcinoma admitted 9/29 with nausea, abdominal pain, cramps and fever worsened over the last few weeks. CT with pneumoperitoneum.  9/30 ex lap with SBR for focal ischemic perforation of unclear etiology with post-op ileus. SBR with primary reanastomosis. Noted to appear cachectic with BMI 17.6 and recent weight loss. Start TPN 10/5.  Now with high suspicion for post-op abscess. Plan CT 10/5.  Glucose / Insulin:  No h/o DM. Electrolytes: Na 131 low, K 3.3 low,  Renal: Scr <1 Hepatic: LFT's WNL on 9/29. Intake / Output; MIVF: NS @75ml /hr, NG output 415ml, LBM none  GI Imaging: - 9/29: Interval development of mild ascites and punctate foci of extraluminal gas within the right upper quadrant and biliary hilum.-enteric perforation.  GI Surgeries / Procedures:  - 9/30: s/p ex lap with SBR for focal ischemic perforation of unclear etiology  Central access:  TPN start date: 10/5  Nutritional Goals: Goal TPN rate is   mL/hr (provides   g of protein and    kcals per day)  RD Assessment:    Current Nutrition:  NPO  Plan:  PICC Start TPN at 40 mL/hr at 1800 Electrolytes in TPN: Na 64mEq/L, K 56mEq/L, Ca 28mEq/L, Mg 48mEq/L, and Phos 27mmol/L. Cl:Ac 1:1 Add standard MVI and trace elements to TPN Initiate Sensitive q 4hr SSI and adjust as needed  Reduce MIVF to 35 mL/hr at 1800 Monitor TPN labs on Mon/Thurs, and PRN NG, bowel rest CT 10/5 to eval for post-op abscess. K runs x 4 per MD orders. F/u RD assessment for goals.   Roseline Ebarb S. Alford Highland, PharmD, BCPS Clinical Staff Pharmacist [email or web address] Alford Highland, [employer] 12/09/2020,8:15 AM

## 2020-12-10 ENCOUNTER — Inpatient Hospital Stay (HOSPITAL_COMMUNITY): Payer: 59

## 2020-12-10 LAB — COMPREHENSIVE METABOLIC PANEL
ALT: 33 U/L (ref 0–44)
AST: 32 U/L (ref 15–41)
Albumin: 2 g/dL — ABNORMAL LOW (ref 3.5–5.0)
Alkaline Phosphatase: 161 U/L — ABNORMAL HIGH (ref 38–126)
Anion gap: 9 (ref 5–15)
BUN: 13 mg/dL (ref 8–23)
CO2: 25 mmol/L (ref 22–32)
Calcium: 8.6 mg/dL — ABNORMAL LOW (ref 8.9–10.3)
Chloride: 96 mmol/L — ABNORMAL LOW (ref 98–111)
Creatinine, Ser: 0.68 mg/dL (ref 0.61–1.24)
GFR, Estimated: >60 mL/min (ref >60)
Glucose, Bld: 142 mg/dL — ABNORMAL HIGH (ref 70–99)
Potassium: 3.7 mmol/L (ref 3.5–5.1)
Sodium: 130 mmol/L — ABNORMAL LOW (ref 135–145)
Total Bilirubin: 0.7 mg/dL (ref 0.3–1.2)
Total Protein: 5.4 g/dL — ABNORMAL LOW (ref 6.5–8.1)

## 2020-12-10 LAB — GLUCOSE, CAPILLARY
Glucose-Capillary: 141 mg/dL — ABNORMAL HIGH (ref 70–99)
Glucose-Capillary: 152 mg/dL — ABNORMAL HIGH (ref 70–99)
Glucose-Capillary: 131 mg/dL — ABNORMAL HIGH (ref 70–99)
Glucose-Capillary: 96 mg/dL (ref 70–99)
Glucose-Capillary: 125 mg/dL — ABNORMAL HIGH (ref 70–99)
Glucose-Capillary: 138 mg/dL — ABNORMAL HIGH (ref 70–99)

## 2020-12-10 LAB — TRIGLYCERIDES: Triglycerides: 177 mg/dL — ABNORMAL HIGH (ref <150)

## 2020-12-10 LAB — PHOSPHORUS: Phosphorus: 2.7 mg/dL (ref 2.5–4.6)

## 2020-12-10 LAB — MAGNESIUM: Magnesium: 1.9 mg/dL (ref 1.7–2.4)

## 2020-12-10 LAB — PROTIME-INR
INR: 1.1 (ref 0.8–1.2)
Prothrombin Time: 13.7 seconds (ref 11.4–15.2)

## 2020-12-10 MED ORDER — SODIUM CHLORIDE 0.9 % IV SOLN: INTRAVENOUS | Status: DC

## 2020-12-10 MED ORDER — FENTANYL CITRATE (PF) 100 MCG/2ML IJ SOLN
INTRAMUSCULAR | Status: DC | PRN
  Administered 2020-12-10: 25 ug via INTRAVENOUS
  Administered 2020-12-10: 50 ug via INTRAVENOUS
  Administered 2020-12-10: 25 ug via INTRAVENOUS
  Administered 2020-12-10: 50 ug via INTRAVENOUS

## 2020-12-10 MED ORDER — PHENOL 1.4 % MT LIQD
2.0000 | OROMUCOSAL | Status: DC | PRN
  Administered 2020-12-10: 2 via OROMUCOSAL
  Filled 2020-12-10: qty 177

## 2020-12-10 MED ORDER — ENOXAPARIN SODIUM 40 MG/0.4ML IJ SOSY
40.0000 mg | PREFILLED_SYRINGE | INTRAMUSCULAR | Status: DC
  Administered 2020-12-11 – 2020-12-21 (×11): 40 mg via SUBCUTANEOUS
  Filled 2020-12-10 (×12): qty 0.4

## 2020-12-10 MED ORDER — MIDAZOLAM HCL 2 MG/2ML IJ SOLN
INTRAMUSCULAR | Status: AC
  Filled 2020-12-10: qty 6

## 2020-12-10 MED ORDER — TRAVASOL 10 % IV SOLN
INTRAVENOUS | Status: AC
  Filled 2020-12-10: qty 960

## 2020-12-10 MED ORDER — LIDOCAINE-EPINEPHRINE 1 %-1:100000 IJ SOLN
INTRAMUSCULAR | Status: AC
  Filled 2020-12-10: qty 1

## 2020-12-10 MED ORDER — FENTANYL CITRATE (PF) 100 MCG/2ML IJ SOLN
INTRAMUSCULAR | Status: AC
  Filled 2020-12-10: qty 4

## 2020-12-10 MED ORDER — MIDAZOLAM HCL 2 MG/2ML IJ SOLN
INTRAMUSCULAR | Status: DC | PRN
  Administered 2020-12-10 (×2): 1 mg via INTRAVENOUS
  Administered 2020-12-10: .5 mg via INTRAVENOUS
  Administered 2020-12-10: 1 mg via INTRAVENOUS
  Administered 2020-12-10: .5 mg via INTRAVENOUS

## 2020-12-10 MED ORDER — DIPHENHYDRAMINE HCL 50 MG/ML IJ SOLN
12.5000 mg | Freq: Four times a day (QID) | INTRAMUSCULAR | Status: DC | PRN
  Administered 2020-12-10 – 2020-12-13 (×4): 25 mg via INTRAVENOUS
  Administered 2020-12-14: 12.5 mg via INTRAVENOUS
  Administered 2020-12-15: 25 mg via INTRAVENOUS
  Filled 2020-12-10 (×8): qty 1

## 2020-12-10 NOTE — Procedures (Signed)
Pre procedural Dx: Post op pelvic fluid collections Post procedural Dx: Same  Technically successful CT guided placement of a 10 Fr drainage catheter placement into the dominant collection within the right hemi-pelvis yielding 75 cc of bloody, minimally complex fluid. A representative aspirated sample was capped and sent to the laboratory for analysis.    Technically successful CT guided aspiration of approximately 5 cc of bloody, minimally complex fluid from smaller collection within the left hemipelvis, too small to warrant drain placement.   EBL: Trace Complications: None immediate  Juan Bacon, MD Pager #: [phone]

## 2020-12-10 NOTE — Progress Notes (Signed)
Progress Note  6 Days Post-Op  Subjective: Had a large, explosive BM yesterday after PO contrast associated with lower abdominal pain. Pain now improved. Denies flatus. Denies further bowel movements.   AFVSS  Objective: Vital signs in last 24 hours: Temp:  [98.4 F (36.9 C)-98.6 F (37 C)] 98.4 F (36.9 C) (10/06 0429) Pulse Rate:  [86-94] 94 (10/06 0429) Resp:  [16] 16 (10/06 0429) BP: (140-164)/(95-98) 140/95 (10/06 0429) SpO2:  [96 %-97 %] 96 % (10/06 0429) Weight:  [56.7 kg-57.7 kg] 57.7 kg (10/06 0457)    Intake/Output from previous day: 10/05 0701 - 10/06 0700 In: 2149.7 [I.V.:1361.7; IV Piggyback:788] Out: 900 [Urine:900] Intake/Output this shift: No intake/output data recorded.  PE: General: pleasant, WD, thin male who is laying in bed in NAD Lungs:  Respiratory effort nonlabored Abd: soft, appropriately ttp, ND, BS hypoactive, incision clean and dry with staples present, NGT with thick bilious output Skin: warm and dry with no masses, lesions, or rashes Psych: A&Ox3 with an appropriate affect.    Lab Results:  Recent Labs    12/08/20 0115 12/09/20 0114  WBC 14.2* 14.7*  HGB 10.5* 11.0*  HCT 31.0* 32.0*  PLT 301 339   BMET Recent Labs    12/09/20 0114 12/10/20 0404  NA 131* 130*  K 3.3* 3.7  CL 98 96*  CO2 23 25  GLUCOSE 73 142*  BUN 11 13  CREATININE 0.78 0.68  CALCIUM 8.4* 8.6*   PT/INR Recent Labs    12/10/20 0404  LABPROT 13.7  INR 1.1   CMP     Component Value Date/Time   NA 130 (L) 12/10/2020 0404   NA 139 12/27/2016 1237   K 3.7 12/10/2020 0404   K 4.2 12/27/2016 1237   CL 96 (L) 12/10/2020 0404   CO2 25 12/10/2020 0404   CO2 25 12/27/2016 1237   GLUCOSE 142 (H) 12/10/2020 0404   GLUCOSE 112 12/27/2016 1237   BUN 13 12/10/2020 0404   BUN 15.2 12/27/2016 1237   CREATININE 0.68 12/10/2020 0404   CREATININE 1.1 12/27/2016 1237   CALCIUM 8.6 (L) 12/10/2020 0404   CALCIUM 9.7 12/27/2016 1237   PROT 5.4 (L) 12/10/2020  0404   PROT 7.6 12/27/2016 1237   ALBUMIN 2.0 (L) 12/10/2020 0404   ALBUMIN 4.1 12/27/2016 1237   AST 32 12/10/2020 0404   AST 20 12/27/2016 1237   ALT 33 12/10/2020 0404   ALT 18 12/27/2016 1237   ALKPHOS 161 (H) 12/10/2020 0404   ALKPHOS 80 12/27/2016 1237   BILITOT 0.7 12/10/2020 0404   BILITOT 0.45 12/27/2016 1237   GFRNONAA >60 12/10/2020 0404   GFRAA >60 11/20/2017 0948   Lipase     Component Value Date/Time   LIPASE 17 12/03/2020 2023       Studies/Results: CT ABDOMEN PELVIS W CONTRAST  Result Date: 12/09/2020 CLINICAL DATA:  Suspected intra-abdominal abscess or infection postoperative day 5 post exploratory laparotomy and small-bowel resection for perforation EXAM: CT ABDOMEN AND PELVIS WITH CONTRAST TECHNIQUE: Multidetector CT imaging of the abdomen and pelvis was performed using the standard protocol following bolus administration of intravenous contrast. Sagittal and coronal MPR images reconstructed from axial data set. CONTRAST:  62mL OMNIPAQUE IOHEXOL 350 MG/ML SOLN IV. Dilute oral contrast. COMPARISON:  12/03/2020 FINDINGS: Lower chest: Bibasilar atelectasis and tiny LEFT pleural effusion. Hepatobiliary: 1.7 cm diameter cyst superiorly at dome of liver. Gallbladder and liver otherwise normal appearance. Pancreas: Normal appearance Spleen: Normal appearance Adrenals/Urinary Tract: Tiny BILATERAL renal cysts.  Adrenal glands, kidneys, ureters, and bladder otherwise normal appearance Stomach/Bowel: Mild distention of proximal stomach despite nasogastric tube. Proximal small bowel loops are mildly dilated consistent with postoperative ileus. Small bowel anastomosis in the central pelvis. Remaining small bowel loops normal appearance. Ascending and transverse colon mildly prominent caliber. Descending colon decompressed with questionable mild wall thickening of the sigmoid colon into rectum. No definite bowel obstruction. Vascular/Lymphatic: Atherosclerotic calcifications aorta and  iliac arteries without aneurysm. Vascular structures patent. Few normal sized retroperitoneal lymph nodes. No adenopathy Reproductive: Unremarkable prostate gland and seminal vesicles Other: Fluid collection in the RIGHT hemipelvis with mildly enhancing wall, 8.2 x 3.8 x 8.3 cm, question infected collection/abscess versus sterile collection. Smaller similar collection with enhancing wall in the LEFT pelvis, 2.9 x 1.4 x 3.9 cm. No free air or additional fluid collection. Gas collection identified in the RIGHT anterior abdominal wall subcutaneous soft tissue. Musculoskeletal: Degenerative disc disease changes L5-S1. IMPRESSION: Two fluid collections in the pelvis with mildly enhancing walls, 8.2 x 3.8 x 8.3 cm and 2.9 x 1.4 x 3.9 cm in sizes, question infected versus sterile collections; neither of these contain air. Mild dilatation of proximal small bowel loops consistent with postoperative ileus. Bibasilar atelectasis and tiny LEFT pleural effusion. Aortic Atherosclerosis (ICD10-I70.0). Electronically Signed   By: Lavonia Dana M.D.   On: 12/09/2020 13:56   [place] EKG SITE RITE  Result Date: 12/09/2020 If Site Rite image not attached, placement could not be confirmed due to current cardiac rhythm.   Anti-infectives: Anti-infectives (From admission, onward)    Start     Dose/Rate Route Frequency Ordered Stop   12/04/20 0700  piperacillin-tazobactam (ZOSYN) IVPB 3.375 g        3.375 g 12.5 mL/hr over 240 Minutes Intravenous Every 8 hours 12/04/20 0218     12/03/20 2315  piperacillin-tazobactam (ZOSYN) IVPB 3.375 g        3.375 g 100 mL/hr over 30 Minutes Intravenous  Once 12/03/20 2308 12/04/20 0002        Assessment/Plan POD7, s/p ex lap with SBR for focal ischemic perforation of unclear etiology, Dr. Zenia Resides 12/04/20 - afebrile, WBC 14,  - postop ileus: cont NGT on LIWS, await return of bowel function  - CT 10/5 with two pelvic fluid collections, IR consulted for possible percutaneous drainage.  -  mobilize, pulm toilet - multi-modal pain control - IV robaxin, IV tylenol , lidoderm patch   FEN - IVFs, NPO/NGT,  PICC/TPN 10/5 >>  VTE - lovenox ID - [email or web address]  Hx of rectal adenocarcinoma Hx of DVT HTN ABL on chronic anemia - hgb stable 11.0  LOS: 6 days    Jill Alexanders, [facility] 12/10/2020, 7:43 AM Please see Amion for pager number during day hours 7:00am-4:30pm

## 2020-12-10 NOTE — Progress Notes (Addendum)
PHARMACY - TOTAL PARENTERAL NUTRITION CONSULT NOTE   Indication: Prolonged ileus  Patient Measurements: Weight: 57.7 kg (127 lb 3.3 oz)   Body mass index is 17.99 kg/m. Usual Weight:    Assessment: 65 y/o M with h/o rectal adenocarcinoma admitted 9/29 with nausea, abdominal pain, cramps and fever worsened over the last few weeks. CT with pneumoperitoneum.  9/30 ex lap with SBR for focal ischemic perforation of unclear etiology with post-op ileus. SBR with primary reanastomosis. Noted to appear cachectic with BMI 17.6 and recent weight loss. Start TPN 10/5. High risk for refeeding due to acute illness and severe PCM with NPO status and weight loss.   Now with high suspicion for post-op abscess. Plan CT 10/5.  Glucose / Insulin:  No h/o DM. A1C 5.9 noted pre-DM. CBGs 69-114 with 1 unit SSI given. Electrolytes: Na 130 low, K 3.7, Cl low 96, Phos 2.7, Mg 1.9 Renal: Scr <1, BUN 13 Hepatic: LFT's WNL, Albumin 2, TG 177 Intake / Output; MIVF: NS @35ml /hr, UOP 951ml,  NG output 0, LBM 10/5 unmeasured  GI Imaging: - 9/29: Interval development of mild ascites and punctate foci of extraluminal gas within the right upper quadrant and biliary hilum.-enteric perforation. - 10/5: CT: Two fluid collections in the pelvis with mildly enhancing walls, question infected versus sterile collections.  Mild dilatation of proximal small bowel loops c/w postoperative ileus.  GI Surgeries / Procedures:  - 9/30: s/p ex lap with SBR for focal ischemic perforation of unclear etiology  Central access:  PICC 10/5 TPN start date: 10/5  Nutritional Goals: Goal TPN rate is  80 mL/hr (provides 96  g of protein and 1973 kcals per day)  RD Assessment: Estimated Needs Total Energy Estimated Needs: 1900-2100 Total Protein Estimated Needs: 85-100 grams Total Fluid Estimated Needs: >/= 1.8 L  Current Nutrition:  NPO  Plan:  Increase TPN to 97ml/hr (goal 51ml/hr) Electrolytes in TPN: Na28mEq/L, K 69mEq/L, Ca  21mEq/L, Mg 34mEq/L, and Phos 29mmol/L. Cl:Ac Max Cl Add standard MVI and trace elements to TPN Initiate Sensitive q 4hr SSI and adjust as needed  Reduce MIVF to NaCl 10 mL/hr at 1800 Monitor TPN labs on Mon/Thurs, and PRN  Amir Glaus S. Alford Highland, PharmD, BCPS Clinical Staff Pharmacist [email or web address] Alford Highland, [employer] 12/10/2020,7:53 AM

## 2020-12-10 NOTE — H&P (Addendum)
Chief Complaint: Patient was seen in consultation today for drainage of abdominal abscesses with possible drain placement at the request of Obie Dredge, PA  Referring Physician(s): Deetta Perla, [place]  Supervising Physician: Mir, Sharen Heck  Patient Status: [facility] - In-pt  History of Present Illness: Juan David is a 65 y.o. male with PMH of chronic anemia, DVT, DM, IDA, blindness in left eye, failure, rectal adenocarcinoma, and traumatic glaucoma of the left eye.  Patient presented to ED 12/03/2021 complaining of severe abdominal pain and diarrhea x1 month and fever.  Patient stated he had 10 pound weight loss over 2-weeks.  CT abdomen showed pneumoperitoneum with intra-abdominal free fluid. On 12/04/2020 patient had exploratory laparotomy and small bowel resection.  Given persistent leukocytosis and ileus there was suspicion for postoperative abscess.  CT 12/09/2020 confirmed 2 fluid collections in the pelvis.  Patient was referred by Obie Dredge, PA for placement of abdominal abscess drain.  CT abdomen 12/09/2020: IMPRESSION: Two fluid collections in the pelvis with mildly enhancing walls, 8.2 x 3.8 x 8.3 cm and 2.9 x 1.4 x 3.9 cm in sizes, question infected versus sterile collections; neither of these contain air.   Mild dilatation of proximal small bowel loops consistent with postoperative ileus.    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   Hypertension    Iron deficiency    Legally blind in left eye, as defined in [place]    per pt only see light   PFO (patent foramen ovale)    per TEE done 05-11-2009    Rectal adenocarcinoma  ([place]) 09/28/2016   Traumatic glaucoma, 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 at [facility] in [place]   08-18-2001   Wears glasses     Past Surgical History:  Procedure Laterality Date   ARTHROSCOPIC REPAIR ACL Right [place] LEFT EYE INJURY  08-18-2001   [place]   ruptured globe- repair corneal laceration, reposition of prolapsed uveal tissue   HEMORRHOID SURGERY N/A 09/01/2016   Procedure: HEMORRHOIDECTOMY;  Surgeon: Leighton Ruff, MD;  Location: [facility];  Service: General;  Laterality: N/A;   LAPAROTOMY N/A 12/04/2020   Procedure: EXPLORATORY LAPAROTOMY small bowel resection;  Surgeon: Dwan Bolt, MD;  Location: [facility];  Service: General;  Laterality: N/A;   SUPERFICIAL KERATECTOMY Left 06-29-2011    [facility]    with EDTA scrub of left eye   TEE WITHOUT CARDIOVERSION  05-11-2009  dr Dorris Carnes   LVSEF 55-65%/  mild thickened AV without AI/  trace MR/ mixed fixed artherosclerosis plaqueing thoracic aorta/  no evidence thrombus/  by agitated saling and color doppler there was a PFO    Allergies: Patient has no known allergies.  Medications: Prior to Admission medications   Medication Sig Start Date End Date Taking? Authorizing Provider  dicyclomine (BENTYL) 20 MG tablet Take 1 tablet (20 mg total) by mouth 3 (three) times daily as needed for spasms (before meals). 12/01/20  Yes Gatha Mayer, MD  hydrochlorothiazide (HYDRODIURIL) 50 MG  tablet Take 1/2 of a tablet by mouth every 24 hours for hypertension. Patient taking differently: Take 25 mg by mouth daily. 11/04/20  Yes   omeprazole (PRILOSEC) 40 MG capsule Take 40 mg by mouth daily.   Yes [provider]  timolol (TIMOPTIC) 0.5 % ophthalmic solution Place 1 drop into the left eye every morning.  11/19/14  Yes [provider]     Family History  Problem Relation Age of Onset   Colon cancer Mother     Social History   Socioeconomic History    Marital status: Married    Spouse name: Not on file   Number of children: Not on file   Years of education: Not on file   Highest education level: Not on file  Occupational History   Not on file  Tobacco Use   Smoking status: Every Day    Types: Cigars   Smokeless tobacco: Former    Quit date: 08/25/2008   Tobacco comments:    per pt currently smoke small cigar x2 daily (average) previous smoked cigeratte until 2010  Vaping Use   Vaping Use: Never used  Substance and Sexual Activity   Alcohol use: Yes    Comment: occasionally   Drug use: Not Currently    Types: Marijuana   Sexual activity: Not on file  Other Topics Concern   Not on file  Social History Narrative   The patient is married no children   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  All other systems are negative.  Review of Systems  Constitutional:  Positive for appetite change. Negative for chills and fever.  Respiratory:  Positive for cough. Negative for shortness of breath.   Cardiovascular:  Positive for chest pain.       Chest pain while coughing  Gastrointestinal:  Positive for abdominal pain and constipation. Negative for blood in stool, nausea and vomiting.   Vital Signs: BP (!) 145/91 (BP Location: Left Arm)   Pulse 92   Temp 98.5 F (36.9 C) (Oral)   Resp 14   Wt 127 lb 3.3 oz (57.7 kg)   SpO2 98%   BMI 17.99 kg/m   Physical Exam Vitals reviewed.  Constitutional:      Appearance: He is ill-appearing.  HENT:     Head:  Normocephalic and atraumatic.     Nose:     Comments: Patient has NG tube to right nare    Mouth/Throat:     Mouth: Mucous membranes are dry.     Pharynx: Oropharynx is clear.  Cardiovascular:     Rate and Rhythm: Regular rhythm. Tachycardia present.     Pulses: Normal pulses.     Heart sounds: Normal heart sounds. No murmur heard.   No gallop.  Pulmonary:     Effort: Pulmonary effort is normal. No respiratory distress.     Breath sounds: Normal breath sounds. No stridor. No wheezing, rhonchi or rales.  Abdominal:     General: Bowel sounds  are normal.     Palpations: Abdomen is soft.     Tenderness: There is abdominal tenderness. There is guarding.     Comments: Midline surgical incision secured with staples.  Proximal incision clean dry intact.  Distal portion of surgical incision has purulent, malodorous drainage.  Musculoskeletal:     Right lower leg: No edema.     Left lower leg: No edema.  Skin:    General: Skin is warm and dry.  Neurological:     Mental Status: He is alert and oriented to person, place, and time.  Psychiatric:        Mood and Affect: Mood normal.        Behavior: Behavior normal.        Thought Content: Thought content normal.        Judgment: Judgment normal.    Imaging: CT ABDOMEN PELVIS W CONTRAST  Result Date: 12/09/2020 CLINICAL DATA:  Suspected intra-abdominal abscess or infection postoperative day 5 post exploratory laparotomy and small-bowel resection for perforation EXAM: CT ABDOMEN AND PELVIS WITH CONTRAST TECHNIQUE: Multidetector CT imaging of the abdomen and pelvis was performed using the standard protocol following bolus administration of intravenous contrast. Sagittal and coronal MPR images reconstructed from axial data set. CONTRAST:  76mL OMNIPAQUE IOHEXOL 350 MG/ML SOLN IV. Dilute oral contrast. COMPARISON:  12/03/2020 FINDINGS: Lower chest: Bibasilar atelectasis and tiny LEFT pleural effusion. Hepatobiliary: 1.7 cm diameter cyst superiorly  at dome of liver. Gallbladder and liver otherwise normal appearance. Pancreas: Normal appearance Spleen: Normal appearance Adrenals/Urinary Tract: Tiny BILATERAL renal cysts. Adrenal glands, kidneys, ureters, and bladder otherwise normal appearance Stomach/Bowel: Mild distention of proximal stomach despite nasogastric tube. Proximal small bowel loops are mildly dilated consistent with postoperative ileus. Small bowel anastomosis in the central pelvis. Remaining small bowel loops normal appearance. Ascending and transverse colon mildly prominent caliber. Descending colon decompressed with questionable mild wall thickening of the sigmoid colon into rectum. No definite bowel obstruction. Vascular/Lymphatic: Atherosclerotic calcifications aorta and iliac arteries without aneurysm. Vascular structures patent. Few normal sized retroperitoneal lymph nodes. No adenopathy Reproductive: Unremarkable prostate gland and seminal vesicles Other: Fluid collection in the RIGHT hemipelvis with mildly enhancing wall, 8.2 x 3.8 x 8.3 cm, question infected collection/abscess versus sterile collection. Smaller similar collection with enhancing wall in the LEFT pelvis, 2.9 x 1.4 x 3.9 cm. No free air or additional fluid collection. Gas collection identified in the RIGHT anterior abdominal wall subcutaneous soft tissue. Musculoskeletal: Degenerative disc disease changes L5-S1. IMPRESSION: Two fluid collections in the pelvis with mildly enhancing walls, 8.2 x 3.8 x 8.3 cm and 2.9 x 1.4 x 3.9 cm in sizes, question infected versus sterile collections; neither of these contain air. Mild dilatation of proximal small bowel loops consistent with postoperative ileus. Bibasilar atelectasis and tiny LEFT pleural effusion. Aortic Atherosclerosis (ICD10-I70.0). Electronically Signed   By: Lavonia Dana M.D.   On: 12/09/2020 13:56   CT ABDOMEN PELVIS W CONTRAST  Result Date: 12/03/2020 CLINICAL DATA:  Abdominal pain, fever, nausea, unspecified  abdominal pain. EXAM: CT ABDOMEN AND PELVIS WITH CONTRAST TECHNIQUE: Multidetector CT imaging of the abdomen and pelvis was performed using the standard protocol following bolus administration of intravenous contrast. CONTRAST:  75mL OMNIPAQUE IOHEXOL 350 MG/ML SOLN COMPARISON:  11/16/2020 FINDINGS: Lower chest: No acute abnormality. Hepatobiliary: Punctate foci of free intraperitoneal gas are seen tracking within the biliary hilum. Simple cyst noted within the right hepatic dome. The liver is otherwise unremarkable. Gallbladder unremarkable. No intra or extrahepatic biliary ductal dilation.  Pancreas: Unremarkable Spleen: Unremarkable Adrenals/Urinary Tract: The adrenal glands are unremarkable. The kidneys are normal in size and position. Scattered hypodensities are seen which are too small to accurately characterize on this examination but are stable since prior examination and may simply represent tiny cortical cysts are again noted. The kidneys are otherwise unremarkable. The bladder is unremarkable. Stomach/Bowel: Since the prior examination there has developed free intraperitoneal gas with punctate foci of gas seen within the perihepatic and right subdiaphragmatic region as well as tracking within the biliary hilum. The exact source of visceral perforation is not clearly identified on this examination, however, the location favors a proximal enteric source as can be seen with gastric or duodenal ulceration. There is mild free fluid within the abdomen. Mild sigmoid diverticulosis noted. The stomach, small bowel, and large bowel are otherwise unremarkable. The appendix is not clearly identified. No evidence of obstruction. Vascular/Lymphatic: Extensive aortoiliac atherosclerotic calcification. No aortic aneurysm. No pathologic adenopathy within the abdomen and pelvis. Reproductive: Unremarkable Other: No abdominal wall hernia. Musculoskeletal: No acute bone abnormality. No lytic or blastic bone lesion.  IMPRESSION: Interval development of mild ascites and punctate foci of extraluminal gas within the right upper quadrant and biliary hilum. While the exact site of enteric perforation is not identified, a proximal source as can be seen with gastric or duodenal ulceration is suspected given the location of extraluminal gas. Aortic Atherosclerosis (ICD10-I70.0). Electronically Signed   By: Fidela Salisbury M.D.   On: 12/03/2020 22:10   CT Abdomen Pelvis W Contrast  Result Date: 11/16/2020 CLINICAL DATA:  Right lower quadrant pain for several days EXAM: CT ABDOMEN AND PELVIS WITH CONTRAST TECHNIQUE: Multidetector CT imaging of the abdomen and pelvis was performed using the standard protocol following bolus administration of intravenous contrast. CONTRAST:  62mL OMNIPAQUE IOHEXOL 350 MG/ML SOLN COMPARISON:  11/24/2017 FINDINGS: Lower chest: Liver is well visualized with a cyst in the right lobe of the liver near the dome of the diaphragm. Gallbladder is within normal limits. Hepatobiliary: No focal liver abnormality is seen. No gallstones, gallbladder wall thickening, or biliary dilatation. Pancreas: Unremarkable. No pancreatic ductal dilatation or surrounding inflammatory changes. Spleen: Spleen is within normal limits. Adrenals/Urinary Tract: Adrenal glands are within normal limits. Kidneys demonstrate a normal enhancement pattern. No renal calculi or obstructive changes are noted. A few small cysts are noted bilaterally. The bladder is decompressed. Stomach/Bowel: Colon shows some retained fecal material without definitive obstructive change. The appendix is not well visualized although no inflammatory changes to suggest appendicitis are seen. Small bowel and stomach appear within normal limits. Vascular/Lymphatic: Abdominal aorta demonstrates diffuse atherosclerotic calcification with mural thrombus identified. The aorta measures up to 2.4 cm in greatest dimension. No significant lymphadenopathy is noted.  Reproductive: Prostate is unremarkable. Other: No abdominal wall hernia or abnormality. No abdominopelvic ascites. Musculoskeletal: No acute or significant osseous findings. IMPRESSION: Hepatic and renal cysts. The appendix is not well visualized although no inflammatory changes to suggest appendicitis are seen. No acute abnormality is identified. Aortic Atherosclerosis (ICD10-I70.0). Electronically Signed   By: Inez Catalina M.D.   On: 11/16/2020 11:54   [place] EKG SITE RITE  Result Date: 12/09/2020 If Site Rite image not attached, placement could not be confirmed due to current cardiac rhythm.   Labs:  CBC: Recent Labs    12/05/20 0033 12/07/20 0406 12/08/20 0115 12/09/20 0114  WBC 13.2* 15.5* 14.2* 14.7*  HGB 11.6* 11.4* 10.5* 11.0*  HCT 34.6* 32.8* 31.0* 32.0*  PLT 362 299 301 339  COAGS: Recent Labs    12/10/20 0404  INR 1.1    BMP: Recent Labs    12/07/20 0406 12/08/20 0115 12/09/20 0114 12/10/20 0404  NA 129* 129* 131* 130*  K 3.4* 2.9* 3.3* 3.7  CL 100 96* 98 96*  CO2 21* 24 23 25   GLUCOSE 103* 103* 73 142*  BUN 9 9 11 13   CALCIUM 8.3* 8.3* 8.4* 8.6*  CREATININE 0.75 0.73 0.78 0.68  GFRNONAA >60 >60 >60 >60    LIVER FUNCTION TESTS: Recent Labs    11/16/20 1001 12/01/20 1220 12/03/20 2023 12/10/20 0404  BILITOT 0.7 0.4 0.3 0.7  AST 24 15 15  32  ALT 25 14 13  33  ALKPHOS 86 81 78 161*  PROT 7.9 7.3 6.7 5.4*  ALBUMIN 4.8 4.0 3.8 2.0*    TUMOR MARKERS: No results for input(s): AFPTM, CEA, CA199, CHROMGRNA in the last 8760 hours.  Assessment and Plan: History of chronic anemia, DVT, DM, IDA, blindness in left eye, failure, rectal adenocarcinoma, and traumatic glaucoma of the left eye.  Patient presented to ED 12/03/2021 complaining of severe abdominal pain and diarrhea x1 month and fever.  Patient stated he had 10 pound weight loss over 2-weeks.  CT abdomen showed pneumoperitoneum with intra-abdominal free fluid. On 12/04/2020 patient had exploratory  laparotomy and small bowel resection.  Given persistent leukocytosis and ileus there was suspicion for postoperative abscess.  CT 12/09/2020 confirmed 2 fluid collections in the pelvis.  Patient was referred by Obie Dredge, PA for placement of abdominal abscess drain.  Patient is NPO with NGT. He is receiving TPN via PICC.  Patient received last SQ Lovenox 12/09/2020 at 1400.  Patient lying in bed with wife at bedside. He is alert and oriented, calm and pleasant.  He is in no distress.  12/09/2020:   WBC  14.7  INR 1.1 today Patient is afebrile, VSS Scheduled Lovenox dose for today has been held.  Risks and benefits discussed with the patient including bleeding, infection, damage to adjacent structures, bowel perforation/fistula connection, and sepsis.  All of the patient's questions were answered, patient is agreeable to proceed. Consent signed and in chart.   Thank you for this interesting consult.  I greatly enjoyed meeting Juan David and look forward to participating in their care.  A copy of this report was sent to the requesting provider on this date.  Electronically Signed: Tyson Alias, NP 12/10/2020, 9:20 AM   I spent a total of 30 minutes in face to face in clinical consultation, greater than 50% of which was counseling/coordinating care for image guided drainage of abdominal abscesses with possible drain placement.

## 2020-12-11 ENCOUNTER — Inpatient Hospital Stay (HOSPITAL_COMMUNITY): Payer: 59

## 2020-12-11 LAB — MAGNESIUM: Magnesium: 1.9 mg/dL (ref 1.7–2.4)

## 2020-12-11 LAB — CBC
HCT: 32.1 % — ABNORMAL LOW (ref 39.0–52.0)
Hemoglobin: 10.5 g/dL — ABNORMAL LOW (ref 13.0–17.0)
MCH: 28.2 pg (ref 26.0–34.0)
MCHC: 32.7 g/dL (ref 30.0–36.0)
MCV: 86.3 fL (ref 80.0–100.0)
Platelets: 427 10*3/uL — ABNORMAL HIGH (ref 150–400)
RBC: 3.72 MIL/uL — ABNORMAL LOW (ref 4.22–5.81)
RDW: 13.7 % (ref 11.5–15.5)
WBC: 12.6 10*3/uL — ABNORMAL HIGH (ref 4.0–10.5)
nRBC: 0 % (ref 0.0–0.2)

## 2020-12-11 LAB — BASIC METABOLIC PANEL
Anion gap: 6 (ref 5–15)
BUN: 13 mg/dL (ref 8–23)
CO2: 26 mmol/L (ref 22–32)
Calcium: 8.5 mg/dL — ABNORMAL LOW (ref 8.9–10.3)
Chloride: 98 mmol/L (ref 98–111)
Creatinine, Ser: 0.62 mg/dL (ref 0.61–1.24)
GFR, Estimated: >60 mL/min (ref >60)
Glucose, Bld: 149 mg/dL — ABNORMAL HIGH (ref 70–99)
Potassium: 3.6 mmol/L (ref 3.5–5.1)
Sodium: 130 mmol/L — ABNORMAL LOW (ref 135–145)

## 2020-12-11 LAB — GLUCOSE, CAPILLARY
Glucose-Capillary: 145 mg/dL — ABNORMAL HIGH (ref 70–99)
Glucose-Capillary: 182 mg/dL — ABNORMAL HIGH (ref 70–99)
Glucose-Capillary: 161 mg/dL — ABNORMAL HIGH (ref 70–99)
Glucose-Capillary: 114 mg/dL — ABNORMAL HIGH (ref 70–99)
Glucose-Capillary: 134 mg/dL — ABNORMAL HIGH (ref 70–99)

## 2020-12-11 LAB — PHOSPHORUS: Phosphorus: 2.8 mg/dL (ref 2.5–4.6)

## 2020-12-11 MED ORDER — TRAVASOL 10 % IV SOLN
INTRAVENOUS | Status: AC
  Filled 2020-12-11: qty 960

## 2020-12-11 NOTE — Progress Notes (Signed)
Progress Note  7 Days Post-Op  Subjective: NAEO. Reports a small amt flatus today. Reports cramping abd pain with flatus. Otherwise no significant changes.    AFVSS  Objective: Vital signs in last 24 hours: Temp:  [98.2 F (36.8 C)-99 F (37.2 C)] 99 F (37.2 C) (10/07 0739) Pulse Rate:  [88-98] 95 (10/07 0739) Resp:  [11-18] 16 (10/07 0739) BP: (113-152)/(74-97) 142/97 (10/07 0739) SpO2:  [91 %-100 %] 100 % (10/07 0739) Weight:  [60.3 kg] 60.3 kg (10/07 0500) Last BM Date: 12/09/20  Intake/Output from previous day: 10/06 0701 - 10/07 0700 In: 2211.5 [I.V.:1610.2; IV Piggyback:601.3] Out: 740 [Urine:250; Emesis/NG output:460; Drains:30] Intake/Output this shift: Total I/O In: -  Out: 150 [Urine:150]  PE: General: pleasant, WD, thin male who is laying in bed in NAD Lungs:  Respiratory effort nonlabored Abd: soft, appropriately ttp, ND, BS hypoactive, incision clean and dry with staples present, NGT with  bilious output 400 cc/24h Skin: warm and dry with no masses, lesions, or rashes Psych: A&Ox3 with an appropriate affect.    Lab Results:  Recent Labs    12/09/20 0114 12/11/20 0350  WBC 14.7* 12.6*  HGB 11.0* 10.5*  HCT 32.0* 32.1*  PLT 339 427*   BMET Recent Labs    12/10/20 0404 12/11/20 0350  NA 130* 130*  K 3.7 3.6  CL 96* 98  CO2 25 26  GLUCOSE 142* 149*  BUN 13 13  CREATININE 0.68 0.62  CALCIUM 8.6* 8.5*   PT/INR Recent Labs    12/10/20 0404  LABPROT 13.7  INR 1.1   CMP     Component Value Date/Time   NA 130 (L) 12/11/2020 0350   NA 139 12/27/2016 1237   K 3.6 12/11/2020 0350   K 4.2 12/27/2016 1237   CL 98 12/11/2020 0350   CO2 26 12/11/2020 0350   CO2 25 12/27/2016 1237   GLUCOSE 149 (H) 12/11/2020 0350   GLUCOSE 112 12/27/2016 1237   BUN 13 12/11/2020 0350   BUN 15.2 12/27/2016 1237   CREATININE 0.62 12/11/2020 0350   CREATININE 1.1 12/27/2016 1237   CALCIUM 8.5 (L) 12/11/2020 0350   CALCIUM 9.7 12/27/2016 1237   PROT  5.4 (L) 12/10/2020 0404   PROT 7.6 12/27/2016 1237   ALBUMIN 2.0 (L) 12/10/2020 0404   ALBUMIN 4.1 12/27/2016 1237   AST 32 12/10/2020 0404   AST 20 12/27/2016 1237   ALT 33 12/10/2020 0404   ALT 18 12/27/2016 1237   ALKPHOS 161 (H) 12/10/2020 0404   ALKPHOS 80 12/27/2016 1237   BILITOT 0.7 12/10/2020 0404   BILITOT 0.45 12/27/2016 1237   GFRNONAA >60 12/11/2020 0350   GFRAA >60 11/20/2017 0948   Lipase     Component Value Date/Time   LIPASE 17 12/03/2020 2023       Studies/Results: CT ASPIRATION  Result Date: 12/11/2020 INDICATION: History of exploratory laparotomy and small-bowel resection for focal ischemic perforation of uncertain etiology, now with infectious symptoms and indeterminate fluid collections within the pelvis worrisome for abscesses. EXAM: 1. CT GUIDED RIGHT TRANS GLUTEAL APPROACH PERCUTANEOUS DRAINAGE CATHETER 2. CT-GUIDED LEFT TRANS GLUTEAL APPROACH PELVIC FLUID COLLECTION ASPIRATION COMPARISON:  None. MEDICATIONS: The patient is currently admitted to the hospital and receiving intravenous antibiotics. The antibiotics were administered within an appropriate time frame prior to the initiation of the procedure. ANESTHESIA/SEDATION: Fentanyl 150 mcg IV; Versed 4 mg IV administered by the interventional radiology nurse. Moderate Sedation Time: 34 minutes - the patient's level of consciousness and  vital signs were monitored continuously by radiology nursing throughout the procedure under my direct supervision. CONTRAST:  None COMPLICATIONS: None immediate. PROCEDURE: Informed written consent was obtained from the patient after a discussion of the risks, benefits and alternatives to treatment. The patient was placed prone on the CT gantry and a pre procedural CT was performed re-demonstrating the known abscess/fluid collection within the lower pelvis with dominant collection within the right hemipelvis measuring approximately 6.8 x 3.8 cm (image 27, series 2 while smaller  collection within the left hemipelvis measuring approximately 3.5 x 1.5 cm (image 25, series 2). The procedure was planned. A timeout was performed prior to the initiation of the procedure. The skin overlying the buttocks was prepped and draped in the usual sterile fashion. The overlying soft tissues were anesthetized with 1% lidocaine with epinephrine. Appropriate trajectory was planned with the use of a 22 gauge spinal needles. 58 gauge trocar needles were utilized to target both fluid collections within the lower pelvis utilizing a right and left trans gluteal approach. Appropriate position was confirmed with CT imaging and short Amplatz wires were coiled within both collections. The track was serially dilated allowing placement of a 10 [place] all-purpose drainage catheter into the dominant collection within the right hemipelvis yielding a total of 75 cc of bloody, minimally complex fluid. A small amount of fluid was capped and sent to the laboratory for analysis. Appropriate positioning was confirmed and demonstrated near resolution of the smaller collection within the left hemipelvis. As such, the trocar needle directed towards the smaller collection with the left hemipelvis was exchanged for a Yueh sheath catheter which was utilized to aspirate approximately 5 cc of bloody, minimally complex fluid. The remaining right trans gluteal approach drainage catheter was secured to the skin entrance site within interrupted suture and a Stat Lock device. Dressings were applied. The patient tolerated the procedures well without immediate postprocedural complication. IMPRESSION: 1. Successful CT guided placement of a 10 French all purpose drain catheter into the right hemipelvis with aspiration of 75 cc of bloody, minimally complex fluid. Samples were sent to the laboratory as requested by the ordering clinical team. 2. Successful CT-guided aspiration of approximately 5 cc of bloody, minimally complex fluid from the  smaller collection within the left hemipelvis, too small to warrant percutaneous drainage. PLAN: Patient will return to the interventional radiology department on 12/11/2020 for ascites search ultrasound and potential paracentesis versus ultrasound-guided drainage catheter placement into additional fluid collection within the perihepatic space demonstrated on abdominal CT performed 12/09/2020. Electronically Signed   By: Sandi Mariscal M.D.   On: 12/11/2020 09:27   CT IMAGE GUIDED FLUID DRAIN BY CATHETER  Result Date: 12/11/2020 INDICATION: History of exploratory laparotomy and small-bowel resection for focal ischemic perforation of uncertain etiology, now with infectious symptoms and indeterminate fluid collections within the pelvis worrisome for abscesses. EXAM: 1. CT GUIDED RIGHT TRANS GLUTEAL APPROACH PERCUTANEOUS DRAINAGE CATHETER 2. CT-GUIDED LEFT TRANS GLUTEAL APPROACH PELVIC FLUID COLLECTION ASPIRATION COMPARISON:  None. MEDICATIONS: The patient is currently admitted to the hospital and receiving intravenous antibiotics. The antibiotics were administered within an appropriate time frame prior to the initiation of the procedure. ANESTHESIA/SEDATION: Fentanyl 150 mcg IV; Versed 4 mg IV administered by the interventional radiology nurse. Moderate Sedation Time: 34 minutes - the patient's level of consciousness and vital signs were monitored continuously by radiology nursing throughout the procedure under my direct supervision. CONTRAST:  None COMPLICATIONS: None immediate. PROCEDURE: Informed written consent was obtained from the patient after a  discussion of the risks, benefits and alternatives to treatment. The patient was placed prone on the CT gantry and a pre procedural CT was performed re-demonstrating the known abscess/fluid collection within the lower pelvis with dominant collection within the right hemipelvis measuring approximately 6.8 x 3.8 cm (image 27, series 2 while smaller collection within the  left hemipelvis measuring approximately 3.5 x 1.5 cm (image 25, series 2). The procedure was planned. A timeout was performed prior to the initiation of the procedure. The skin overlying the buttocks was prepped and draped in the usual sterile fashion. The overlying soft tissues were anesthetized with 1% lidocaine with epinephrine. Appropriate trajectory was planned with the use of a 22 gauge spinal needles. 53 gauge trocar needles were utilized to target both fluid collections within the lower pelvis utilizing a right and left trans gluteal approach. Appropriate position was confirmed with CT imaging and short Amplatz wires were coiled within both collections. The track was serially dilated allowing placement of a 10 [place] all-purpose drainage catheter into the dominant collection within the right hemipelvis yielding a total of 75 cc of bloody, minimally complex fluid. A small amount of fluid was capped and sent to the laboratory for analysis. Appropriate positioning was confirmed and demonstrated near resolution of the smaller collection within the left hemipelvis. As such, the trocar needle directed towards the smaller collection with the left hemipelvis was exchanged for a Yueh sheath catheter which was utilized to aspirate approximately 5 cc of bloody, minimally complex fluid. The remaining right trans gluteal approach drainage catheter was secured to the skin entrance site within interrupted suture and a Stat Lock device. Dressings were applied. The patient tolerated the procedures well without immediate postprocedural complication. IMPRESSION: 1. Successful CT guided placement of a 10 French all purpose drain catheter into the right hemipelvis with aspiration of 75 cc of bloody, minimally complex fluid. Samples were sent to the laboratory as requested by the ordering clinical team. 2. Successful CT-guided aspiration of approximately 5 cc of bloody, minimally complex fluid from the smaller collection within  the left hemipelvis, too small to warrant percutaneous drainage. PLAN: Patient will return to the interventional radiology department on 12/11/2020 for ascites search ultrasound and potential paracentesis versus ultrasound-guided drainage catheter placement into additional fluid collection within the perihepatic space demonstrated on abdominal CT performed 12/09/2020. Electronically Signed   By: Sandi Mariscal M.D.   On: 12/11/2020 09:27    Anti-infectives: Anti-infectives (From admission, onward)    Start     Dose/Rate Route Frequency Ordered Stop   12/04/20 0700  piperacillin-tazobactam (ZOSYN) IVPB 3.375 g        3.375 g 12.5 mL/hr over 240 Minutes Intravenous Every 8 hours 12/04/20 0218     12/03/20 2315  piperacillin-tazobactam (ZOSYN) IVPB 3.375 g        3.375 g 100 mL/hr over 30 Minutes Intravenous  Once 12/03/20 2308 12/04/20 0002        Assessment/Plan POD8, s/p ex lap with SBR for focal ischemic perforation of unclear etiology, Dr. Zenia Resides 12/04/20 - afebrile, WBC 12 from 14 - CT 10/5 with two pelvic fluid collections - IR placed a perc drain in RLQ 10/6. They plan to attempt aspiration of some fluid around the liver today 10/7. - postop ileus: cont NGT on LIWS; having a small amt flatus today, may be able to attempt clamp trial over the weekend. - mobilize, pulm toilet - multi-modal pain control - IV robaxin, IV tylenol , lidoderm patch   FEN -  IVFs, NPO/NGT,  PICC/TPN 10/5 >> ; drain cx 10/6 w/ GNR few enterococcus faecalis - reincubated VTE - lovenox ID - [email or web address]  Hx of rectal adenocarcinoma Hx of DVT HTN ABL on chronic anemia - hgb stable 11.0   LOS: 7 days    Jill Alexanders, [facility] 12/11/2020, 2:55 PM Please see Amion for pager number during day hours 7:00am-4:30pm

## 2020-12-11 NOTE — Progress Notes (Signed)
Patient presents for diagnostic paracentesis. [place] limited abdomen shows trace amount of peritoneal fluid noted  Insufficient to perform a safe paracentesis. Procedure not performed.

## 2020-12-11 NOTE — Progress Notes (Signed)
Referring Physician(s): E. Simaan PA  Supervising Physician: Mir, Biochemist, clinical  Patient Status:  [facility] - In-pt  Chief Complaint:  History of small bowel perforation s/p  ex lap and resection found to have pelvic abscesses s/p a right transgluteal approach pelvic drain placement and left hemipelvis aspiration by Dr. Pascal Lux on 10.6.22  Subjective:  Patient states that he is feeling better but is eager go home when he is medically stabilized.   Allergies: Patient has no known allergies.  Medications: Prior to Admission medications   Medication Sig Start Date End Date Taking? Authorizing Provider  dicyclomine (BENTYL) 20 MG tablet Take 1 tablet (20 mg total) by mouth 3 (three) times daily as needed for spasms (before meals). 12/01/20  Yes Gatha Mayer, MD  hydrochlorothiazide (HYDRODIURIL) 50 MG tablet Take 1/2 of a tablet by mouth every 24 hours for hypertension. Patient taking differently: Take 25 mg by mouth daily. 11/04/20  Yes   omeprazole (PRILOSEC) 40 MG capsule Take 40 mg by mouth daily.   Yes [provider]  timolol (TIMOPTIC) 0.5 % ophthalmic solution Place 1 drop into the left eye every morning.  11/19/14  Yes [provider]     Vital Signs: BP (!) 142/97 (BP Location: Left Arm)   Pulse 95   Temp 99 F (37.2 C) (Oral)   Resp 16   Wt 132 lb 15 oz (60.3 kg)   SpO2 100%   BMI 18.80 kg/m   Physical Exam Vitals and nursing note reviewed.  Constitutional:      Appearance: He is well-developed.  HENT:     Head: Normocephalic.  Pulmonary:     Effort: Pulmonary effort is normal.  Abdominal:     Comments: Positive right transgluteal pelvic drain to  gravity bag. Site is unremarkable with no erythema, edema, tenderness, bleeding or drainage noted at exit site. Suture and stat lock in place. Dressing is clean dry and intact. 20 ml of  serosanguinous colored fluid noted in the gravity bag.     Musculoskeletal:        General: Normal range of motion.      Cervical back: Normal range of motion.  Skin:    General: Skin is dry.  Neurological:     Mental Status: He is alert and oriented to person, place, and time.    Imaging: CT ABDOMEN PELVIS W CONTRAST  Result Date: 12/09/2020 CLINICAL DATA:  Suspected intra-abdominal abscess or infection postoperative day 5 post exploratory laparotomy and small-bowel resection for perforation EXAM: CT ABDOMEN AND PELVIS WITH CONTRAST TECHNIQUE: Multidetector CT imaging of the abdomen and pelvis was performed using the standard protocol following bolus administration of intravenous contrast. Sagittal and coronal MPR images reconstructed from axial data set. CONTRAST:  19mL OMNIPAQUE IOHEXOL 350 MG/ML SOLN IV. Dilute oral contrast. COMPARISON:  12/03/2020 FINDINGS: Lower chest: Bibasilar atelectasis and tiny LEFT pleural effusion. Hepatobiliary: 1.7 cm diameter cyst superiorly at dome of liver. Gallbladder and liver otherwise normal appearance. Pancreas: Normal appearance Spleen: Normal appearance Adrenals/Urinary Tract: Tiny BILATERAL renal cysts. Adrenal glands, kidneys, ureters, and bladder otherwise normal appearance Stomach/Bowel: Mild distention of proximal stomach despite nasogastric tube. Proximal small bowel loops are mildly dilated consistent with postoperative ileus. Small bowel anastomosis in the central pelvis. Remaining small bowel loops normal appearance. Ascending and transverse colon mildly prominent caliber. Descending colon decompressed with questionable mild wall thickening of the sigmoid colon into rectum. No definite bowel obstruction. Vascular/Lymphatic: Atherosclerotic calcifications aorta and iliac arteries without aneurysm. Vascular  structures patent. Few normal sized retroperitoneal lymph nodes. No adenopathy Reproductive: Unremarkable prostate gland and seminal vesicles Other: Fluid collection in the RIGHT hemipelvis with mildly enhancing wall, 8.2 x 3.8 x 8.3 cm, question infected  collection/abscess versus sterile collection. Smaller similar collection with enhancing wall in the LEFT pelvis, 2.9 x 1.4 x 3.9 cm. No free air or additional fluid collection. Gas collection identified in the RIGHT anterior abdominal wall subcutaneous soft tissue. Musculoskeletal: Degenerative disc disease changes L5-S1. IMPRESSION: Two fluid collections in the pelvis with mildly enhancing walls, 8.2 x 3.8 x 8.3 cm and 2.9 x 1.4 x 3.9 cm in sizes, question infected versus sterile collections; neither of these contain air. Mild dilatation of proximal small bowel loops consistent with postoperative ileus. Bibasilar atelectasis and tiny LEFT pleural effusion. Aortic Atherosclerosis (ICD10-I70.0). Electronically Signed   By: Lavonia Dana M.D.   On: 12/09/2020 13:56   CT ASPIRATION  Result Date: 12/11/2020 INDICATION: History of exploratory laparotomy and small-bowel resection for focal ischemic perforation of uncertain etiology, now with infectious symptoms and indeterminate fluid collections within the pelvis worrisome for abscesses. EXAM: 1. CT GUIDED RIGHT TRANS GLUTEAL APPROACH PERCUTANEOUS DRAINAGE CATHETER 2. CT-GUIDED LEFT TRANS GLUTEAL APPROACH PELVIC FLUID COLLECTION ASPIRATION COMPARISON:  None. MEDICATIONS: The patient is currently admitted to the hospital and receiving intravenous antibiotics. The antibiotics were administered within an appropriate time frame prior to the initiation of the procedure. ANESTHESIA/SEDATION: Fentanyl 150 mcg IV; Versed 4 mg IV administered by the interventional radiology nurse. Moderate Sedation Time: 34 minutes - the patient's level of consciousness and vital signs were monitored continuously by radiology nursing throughout the procedure under my direct supervision. CONTRAST:  None COMPLICATIONS: None immediate. PROCEDURE: Informed written consent was obtained from the patient after a discussion of the risks, benefits and alternatives to treatment. The patient was placed  prone on the CT gantry and a pre procedural CT was performed re-demonstrating the known abscess/fluid collection within the lower pelvis with dominant collection within the right hemipelvis measuring approximately 6.8 x 3.8 cm (image 27, series 2 while smaller collection within the left hemipelvis measuring approximately 3.5 x 1.5 cm (image 25, series 2). The procedure was planned. A timeout was performed prior to the initiation of the procedure. The skin overlying the buttocks was prepped and draped in the usual sterile fashion. The overlying soft tissues were anesthetized with 1% lidocaine with epinephrine. Appropriate trajectory was planned with the use of a 22 gauge spinal needles. 47 gauge trocar needles were utilized to target both fluid collections within the lower pelvis utilizing a right and left trans gluteal approach. Appropriate position was confirmed with CT imaging and short Amplatz wires were coiled within both collections. The track was serially dilated allowing placement of a 10 [place] all-purpose drainage catheter into the dominant collection within the right hemipelvis yielding a total of 75 cc of bloody, minimally complex fluid. A small amount of fluid was capped and sent to the laboratory for analysis. Appropriate positioning was confirmed and demonstrated near resolution of the smaller collection within the left hemipelvis. As such, the trocar needle directed towards the smaller collection with the left hemipelvis was exchanged for a Yueh sheath catheter which was utilized to aspirate approximately 5 cc of bloody, minimally complex fluid. The remaining right trans gluteal approach drainage catheter was secured to the skin entrance site within interrupted suture and a Stat Lock device. Dressings were applied. The patient tolerated the procedures well without immediate postprocedural complication. IMPRESSION: 1. Successful CT  guided placement of a 10 [place] all purpose drain catheter into the  right hemipelvis with aspiration of 75 cc of bloody, minimally complex fluid. Samples were sent to the laboratory as requested by the ordering clinical team. 2. Successful CT-guided aspiration of approximately 5 cc of bloody, minimally complex fluid from the smaller collection within the left hemipelvis, too small to warrant percutaneous drainage. PLAN: Patient will return to the interventional radiology department on 12/11/2020 for ascites search ultrasound and potential paracentesis versus ultrasound-guided drainage catheter placement into additional fluid collection within the perihepatic space demonstrated on abdominal CT performed 12/09/2020. Electronically Signed   By: Sandi Mariscal M.D.   On: 12/11/2020 09:27   IR ABDOMEN US LIMITED  Result Date: 12/11/2020 CLINICAL DATA:  65 year old gentleman with ischemic bowel perforation, status post pelvic abscess drain placement, returns to IR for paracentesis. EXAM: ULTRASOUND ABDOMEN LIMITED COMPARISON:  None. FINDINGS: Sonographic evaluation of the abdomen demonstrates trace perihepatic fluid which is too small for aspiration. IMPRESSION: Trace perihepatic ascites is insufficient for paracentesis. Electronically Signed   By: Miachel Roux M.D.   On: 12/11/2020 14:55   CT IMAGE GUIDED FLUID DRAIN BY CATHETER  Result Date: 12/11/2020 INDICATION: History of exploratory laparotomy and small-bowel resection for focal ischemic perforation of uncertain etiology, now with infectious symptoms and indeterminate fluid collections within the pelvis worrisome for abscesses. EXAM: 1. CT GUIDED RIGHT TRANS GLUTEAL APPROACH PERCUTANEOUS DRAINAGE CATHETER 2. CT-GUIDED LEFT TRANS GLUTEAL APPROACH PELVIC FLUID COLLECTION ASPIRATION COMPARISON:  None. MEDICATIONS: The patient is currently admitted to the hospital and receiving intravenous antibiotics. The antibiotics were administered within an appropriate time frame prior to the initiation of the procedure. ANESTHESIA/SEDATION:  Fentanyl 150 mcg IV; Versed 4 mg IV administered by the interventional radiology nurse. Moderate Sedation Time: 34 minutes - the patient's level of consciousness and vital signs were monitored continuously by radiology nursing throughout the procedure under my direct supervision. CONTRAST:  None COMPLICATIONS: None immediate. PROCEDURE: Informed written consent was obtained from the patient after a discussion of the risks, benefits and alternatives to treatment. The patient was placed prone on the CT gantry and a pre procedural CT was performed re-demonstrating the known abscess/fluid collection within the lower pelvis with dominant collection within the right hemipelvis measuring approximately 6.8 x 3.8 cm (image 27, series 2 while smaller collection within the left hemipelvis measuring approximately 3.5 x 1.5 cm (image 25, series 2). The procedure was planned. A timeout was performed prior to the initiation of the procedure. The skin overlying the buttocks was prepped and draped in the usual sterile fashion. The overlying soft tissues were anesthetized with 1% lidocaine with epinephrine. Appropriate trajectory was planned with the use of a 22 gauge spinal needles. 34 gauge trocar needles were utilized to target both fluid collections within the lower pelvis utilizing a right and left trans gluteal approach. Appropriate position was confirmed with CT imaging and short Amplatz wires were coiled within both collections. The track was serially dilated allowing placement of a 10 [place] all-purpose drainage catheter into the dominant collection within the right hemipelvis yielding a total of 75 cc of bloody, minimally complex fluid. A small amount of fluid was capped and sent to the laboratory for analysis. Appropriate positioning was confirmed and demonstrated near resolution of the smaller collection within the left hemipelvis. As such, the trocar needle directed towards the smaller collection with the left hemipelvis  was exchanged for a Yueh sheath catheter which was utilized to aspirate approximately 5 cc of  bloody, minimally complex fluid. The remaining right trans gluteal approach drainage catheter was secured to the skin entrance site within interrupted suture and a Stat Lock device. Dressings were applied. The patient tolerated the procedures well without immediate postprocedural complication. IMPRESSION: 1. Successful CT guided placement of a 10 [place] all purpose drain catheter into the right hemipelvis with aspiration of 75 cc of bloody, minimally complex fluid. Samples were sent to the laboratory as requested by the ordering clinical team. 2. Successful CT-guided aspiration of approximately 5 cc of bloody, minimally complex fluid from the smaller collection within the left hemipelvis, too small to warrant percutaneous drainage. PLAN: Patient will return to the interventional radiology department on 12/11/2020 for ascites search ultrasound and potential paracentesis versus ultrasound-guided drainage catheter placement into additional fluid collection within the perihepatic space demonstrated on abdominal CT performed 12/09/2020. Electronically Signed   By: Sandi Mariscal M.D.   On: 12/11/2020 09:27   [place] EKG SITE RITE  Result Date: 12/09/2020 If Site Rite image not attached, placement could not be confirmed due to current cardiac rhythm.   Labs:  CBC: Recent Labs    12/07/20 0406 12/08/20 0115 12/09/20 0114 12/11/20 0350  WBC 15.5* 14.2* 14.7* 12.6*  HGB 11.4* 10.5* 11.0* 10.5*  HCT 32.8* 31.0* 32.0* 32.1*  PLT 299 301 339 427*    COAGS: Recent Labs    12/10/20 0404  INR 1.1    BMP: Recent Labs    12/08/20 0115 12/09/20 0114 12/10/20 0404 12/11/20 0350  NA 129* 131* 130* 130*  K 2.9* 3.3* 3.7 3.6  CL 96* 98 96* 98  CO2 24 23 25 26   GLUCOSE 103* 73 142* 149*  BUN 9 11 13 13   CALCIUM 8.3* 8.4* 8.6* 8.5*  CREATININE 0.73 0.78 0.68 0.62  GFRNONAA >60 >60 >60 >60    LIVER FUNCTION  TESTS: Recent Labs    11/16/20 1001 12/01/20 1220 12/03/20 2023 12/10/20 0404  BILITOT 0.7 0.4 0.3 0.7  AST 24 15 15  32  ALT 25 14 13  33  ALKPHOS 86 81 78 161*  PROT 7.9 7.3 6.7 5.4*  ALBUMIN 4.8 4.0 3.8 2.0*    Assessment and Plan:   Drain Location: right hemipelvis abscess drain via right transgluteal approach Size: Fr size: 10 Fr Date of placement:  10.6.22  Currently to: Drain collection device: gravity 20 ml of serosanguinous output noted.  24 hour output:  Output by Drain (mL) 12/09/20 0701 - 12/09/20 1900 12/09/20 1901 - 12/10/20 0700 12/10/20 0701 - 12/10/20 1900 12/10/20 1901 - 12/11/20 0700 12/11/20 0701 - 12/11/20 1555  Closed System Drain 1 Right Buttock Other (Comment) 10 Fr.    30     Interval imaging/drain manipulation:  None since drain placement  Current examination: Flushes/aspirates easily.  Insertion site unremarkable. Suture and stat lock in place. Dressed appropriately.  WBC 12.6. Cultures  show few gram negative rods and few enterococcus faecalis. Patient is afebrile  Plan: Continue TID flushes with 5 cc NS. Record output Q shift. Dressing changes QD or PRN if soiled.  Call IR APP or on call IR MD if difficulty flushing or sudden change in drain output.  Repeat imaging/possible drain injection once output < 10 mL/QD (excluding flush material.)  Discharge planning: Please contact IR APP or on call IR MD prior to patient d/c to ensure appropriate follow up plans are in place. Typically patient will follow up with IR clinic 10-14 days post d/c for repeat imaging/possible drain injection. IR scheduler will  contact patient with date/time of appointment. Patient will need to flush drain QD with 5 cc NS, record output QD, dressing changes every 2-3 days or earlier if soiled.   IR will continue to follow - please call with questions or concerns.    Electronically Signed: Jacqualine Mau, NP 12/11/2020, 3:54 PM   I spent a total of 15 Minutes  at the the patient's bedside AND on the patient's hospital floor or unit, greater than 50% of which was counseling/coordinating care for pelvic abscess drains

## 2020-12-11 NOTE — Progress Notes (Signed)
PHARMACY - TOTAL PARENTERAL NUTRITION CONSULT NOTE   Indication: Prolonged ileus  Patient Measurements: Weight: 60.3 kg (132 lb 15 oz)   Body mass index is 18.8 kg/m. Usual Weight:    Assessment: 65 y/o M with h/o rectal adenocarcinoma admitted 9/29 with nausea, abdominal pain, cramps and fever worsened over the last few weeks. CT with pneumoperitoneum.  9/30 ex lap with SBR for focal ischemic perforation of unclear etiology with post-op ileus. SBR with primary reanastomosis. Noted to appear cachectic with BMI 17.6 and recent weight loss. Start TPN 10/5. High risk for refeeding due to acute illness and severe PCM with NPO status and weight loss.   - 10/5: Now with high suspicion for post-op abscess. 10/6: Drain placed   Glucose / Insulin:  No h/o DM. A1C 5.9 noted pre-DM. CBGs 96-182 with 4 units SSI given.  Electrolytes: Na 130 low, K 3.6, Cl low 98 up, Phos 2.8, Mg 1.9 Renal: Scr <1, BUN 13 Hepatic: LFT's WNL, Albumin 2, TG 177 ID: Afebrile, WBC remain slightly elevated. F/u wound cultures from 10/6. - Zosyn 9/30>> Intake / Output; MIVF: NS @10ml /hr, UOP 216ml,  NG output 0, LBM 10/5 unmeasured - R buttock drain:74ml  GI Imaging: - 9/29: Interval development of mild ascites and punctate foci of extraluminal gas within the right upper quadrant and biliary hilum.-enteric perforation. - 10/5: CT: Two fluid collections in the pelvis with mildly enhancing walls, question infected versus sterile collections. Mild dilatation of proximal small bowel loops c/w postoperative ileus.  GI Surgeries / Procedures:  - 9/30: s/p ex lap with SBR for focal ischemic perforation of unclear etiology - 10/6: Pelvic fluid collection, drain placed  Central access:  PICC 10/5 TPN start date: 10/5  Nutritional Goals: Goal TPN rate is 80 mL/hr (provides 96  g of protein and 1973 kcals per day)  RD Assessment: Estimated Needs Total Energy Estimated Needs: 1900-2100 Total Protein Estimated Needs:  85-100 grams Total Fluid Estimated Needs: >/= 1.8 L  Current Nutrition:  NPO  Plan:  No change to TPN formula today. TPN at goal 80 ml/hr will provide (96g AA, 326g dextrose (GIR 3.93), 48g lipids (24%), and 1973 kcal)  Electrolytes in TPN: Na38mEq/L, K 70mEq/L, Ca 28mEq/L, Mg 49mEq/L, and Phos 70mmol/L. Cl:Ac Max Cl Add standard MVI and trace elements to TPN Initiate Sensitive q 4hr SSI and adjust as needed  Reduce MIVF to NaCl 10 mL/hr at 1800 Monitor TPN labs on Mon/Thurs, and PRN  Torry Adamczak S. Alford Highland, PharmD, BCPS Clinical Staff Pharmacist [email or web address] Alford Highland, Yvanna Vidas Stillinger 12/11/2020,8:03 AM

## 2020-12-12 LAB — COMPREHENSIVE METABOLIC PANEL
ALT: 74 U/L — ABNORMAL HIGH (ref 0–44)
AST: 75 U/L — ABNORMAL HIGH (ref 15–41)
Albumin: 2.1 g/dL — ABNORMAL LOW (ref 3.5–5.0)
Alkaline Phosphatase: 160 U/L — ABNORMAL HIGH (ref 38–126)
Anion gap: 5 (ref 5–15)
BUN: 12 mg/dL (ref 8–23)
CO2: 25 mmol/L (ref 22–32)
Calcium: 8.6 mg/dL — ABNORMAL LOW (ref 8.9–10.3)
Chloride: 101 mmol/L (ref 98–111)
Creatinine, Ser: 0.54 mg/dL — ABNORMAL LOW (ref 0.61–1.24)
GFR, Estimated: >60 mL/min (ref >60)
Glucose, Bld: 134 mg/dL — ABNORMAL HIGH (ref 70–99)
Potassium: 3.8 mmol/L (ref 3.5–5.1)
Sodium: 131 mmol/L — ABNORMAL LOW (ref 135–145)
Total Bilirubin: 0.4 mg/dL (ref 0.3–1.2)
Total Protein: 5.3 g/dL — ABNORMAL LOW (ref 6.5–8.1)

## 2020-12-12 LAB — GLUCOSE, CAPILLARY
Glucose-Capillary: 104 mg/dL — ABNORMAL HIGH (ref 70–99)
Glucose-Capillary: 123 mg/dL — ABNORMAL HIGH (ref 70–99)
Glucose-Capillary: 128 mg/dL — ABNORMAL HIGH (ref 70–99)
Glucose-Capillary: 129 mg/dL — ABNORMAL HIGH (ref 70–99)
Glucose-Capillary: 130 mg/dL — ABNORMAL HIGH (ref 70–99)
Glucose-Capillary: 187 mg/dL — ABNORMAL HIGH (ref 70–99)

## 2020-12-12 LAB — MAGNESIUM: Magnesium: 1.8 mg/dL (ref 1.7–2.4)

## 2020-12-12 LAB — PHOSPHORUS: Phosphorus: 3.1 mg/dL (ref 2.5–4.6)

## 2020-12-12 MED ORDER — MAGNESIUM SULFATE 2 GM/50ML IV SOLN
2.0000 g | Freq: Once | INTRAVENOUS | Status: AC
  Administered 2020-12-12: 2 g via INTRAVENOUS
  Filled 2020-12-12 (×2): qty 50

## 2020-12-12 MED ORDER — SODIUM CHLORIDE 0.9% FLUSH
5.0000 mL | Freq: Three times a day (TID) | INTRAVENOUS | Status: DC
  Administered 2020-12-12 – 2020-12-21 (×25): 5 mL

## 2020-12-12 MED ORDER — TRAVASOL 10 % IV SOLN
INTRAVENOUS | Status: AC
  Filled 2020-12-12: qty 960

## 2020-12-12 MED ORDER — INSULIN ASPART 100 UNIT/ML IJ SOLN
0.0000 [IU] | Freq: Three times a day (TID) | INTRAMUSCULAR | Status: DC
  Administered 2020-12-12 – 2020-12-14 (×4): 1 [IU] via SUBCUTANEOUS

## 2020-12-12 MED ORDER — POTASSIUM CHLORIDE 10 MEQ/50ML IV SOLN
10.0000 meq | INTRAVENOUS | Status: AC
  Administered 2020-12-12 (×3): 10 meq via INTRAVENOUS
  Filled 2020-12-12 (×4): qty 50

## 2020-12-12 NOTE — Progress Notes (Signed)
Assessment & Plan: POD#8 - s/p ex lap with SBR for focal ischemic perforation of unclear etiology, Dr. Zenia Resides 12/04/20 - IR placed a perc drain in RLQ 10/6 - postop ileus - now having explosive BM's - will clamp NG and allow clear liquid diet today - mobilize, pulm toilet - multi-modal pain control - IV robaxin, IV tylenol , lidoderm patch   FEN - IVFs, NPO/NGT - clamped,  PICC/TPN 10/5 >> VTE - lovenox ID - [email or web address]   Hx of rectal adenocarcinoma Hx of DVT HTN ABL on chronic anemia        Armandina Gemma, MD       [facility]       Office: [phone]   Chief Complaint: SBO, abscess  Subjective: Patient in bed, nursing and family at bedside.  Complains of explosive BM's this AM.  Objective: Vital signs in last 24 hours: Temp:  [98.1 F (36.7 C)-99.4 F (37.4 C)] 98.2 F (36.8 C) (10/08 0739) Pulse Rate:  [82-100] 90 (10/08 0739) Resp:  [16] 16 (10/08 0739) BP: (139-165)/(88-93) 139/88 (10/08 0739) SpO2:  [99 %] 99 % (10/08 0739) Last BM Date: 12/11/20  Intake/Output from previous day: 10/07 0701 - 10/08 0700 In: 1086.8 [P.O.:60; I.V.:746.1; IV Piggyback:275.7] Out: 1490 [Urine:750; Emesis/NG output:715; Drains:25] Intake/Output this shift: No intake/output data recorded.  Physical Exam: HEENT - sclerae clear, mucous membranes moist Neck - soft Abdomen - soft without distension; midline dressing dry and intact; active BS present Ext - no edema, non-tender Neuro - alert & oriented, no focal deficits  Lab Results:  Recent Labs    12/11/20 0350  WBC 12.6*  HGB 10.5*  HCT 32.1*  PLT 427*   BMET Recent Labs    12/11/20 0350 12/12/20 0406  NA 130* 131*  K 3.6 3.8  CL 98 101  CO2 26 25  GLUCOSE 149* 134*  BUN 13 12  CREATININE 0.62 0.54*  CALCIUM 8.5* 8.6*   PT/INR Recent Labs    12/10/20 0404  LABPROT 13.7  INR 1.1   Comprehensive Metabolic Panel:    Component Value Date/Time   NA 131 (L) 12/12/2020 0406   NA 130 (L)  12/11/2020 0350   NA 139 12/27/2016 1237   NA 137 11/21/2016 0928   K 3.8 12/12/2020 0406   K 3.6 12/11/2020 0350   K 4.2 12/27/2016 1237   K 4.0 11/21/2016 0928   CL 101 12/12/2020 0406   CL 98 12/11/2020 0350   CO2 25 12/12/2020 0406   CO2 26 12/11/2020 0350   CO2 25 12/27/2016 1237   CO2 26 11/21/2016 0928   BUN 12 12/12/2020 0406   BUN 13 12/11/2020 0350   BUN 15.2 12/27/2016 1237   BUN 15.5 11/21/2016 0928   CREATININE 0.54 (L) 12/12/2020 0406   CREATININE 0.62 12/11/2020 0350   CREATININE 1.1 12/27/2016 1237   CREATININE 1.1 11/21/2016 0928   GLUCOSE 134 (H) 12/12/2020 0406   GLUCOSE 149 (H) 12/11/2020 0350   GLUCOSE 112 12/27/2016 1237   GLUCOSE 90 11/21/2016 0928   CALCIUM 8.6 (L) 12/12/2020 0406   CALCIUM 8.5 (L) 12/11/2020 0350   CALCIUM 9.7 12/27/2016 1237   CALCIUM 9.8 11/21/2016 0928   AST 75 (H) 12/12/2020 0406   AST 32 12/10/2020 0404   AST 20 12/27/2016 1237   AST 18 11/21/2016 0928   ALT 74 (H) 12/12/2020 0406   ALT 33 12/10/2020 0404   ALT 18 12/27/2016 1237   ALT 14 11/21/2016  0928   ALKPHOS 160 (H) 12/12/2020 0406   ALKPHOS 161 (H) 12/10/2020 0404   ALKPHOS 80 12/27/2016 1237   ALKPHOS 75 11/21/2016 0928   BILITOT 0.4 12/12/2020 0406   BILITOT 0.7 12/10/2020 0404   BILITOT 0.45 12/27/2016 1237   BILITOT 0.41 11/21/2016 0928   PROT 5.3 (L) 12/12/2020 0406   PROT 5.4 (L) 12/10/2020 0404   PROT 7.6 12/27/2016 1237   PROT 7.1 11/21/2016 0928   ALBUMIN 2.1 (L) 12/12/2020 0406   ALBUMIN 2.0 (L) 12/10/2020 0404   ALBUMIN 4.1 12/27/2016 1237   ALBUMIN 3.8 11/21/2016 0928    Studies/Results: CT ASPIRATION  Result Date: 12/11/2020 INDICATION: History of exploratory laparotomy and small-bowel resection for focal ischemic perforation of uncertain etiology, now with infectious symptoms and indeterminate fluid collections within the pelvis worrisome for abscesses. EXAM: 1. CT GUIDED RIGHT TRANS GLUTEAL APPROACH PERCUTANEOUS DRAINAGE CATHETER 2.  CT-GUIDED LEFT TRANS GLUTEAL APPROACH PELVIC FLUID COLLECTION ASPIRATION COMPARISON:  None. MEDICATIONS: The patient is currently admitted to the hospital and receiving intravenous antibiotics. The antibiotics were administered within an appropriate time frame prior to the initiation of the procedure. ANESTHESIA/SEDATION: Fentanyl 150 mcg IV; Versed 4 mg IV administered by the interventional radiology nurse. Moderate Sedation Time: 34 minutes - the patient's level of consciousness and vital signs were monitored continuously by radiology nursing throughout the procedure under my direct supervision. CONTRAST:  None COMPLICATIONS: None immediate. PROCEDURE: Informed written consent was obtained from the patient after a discussion of the risks, benefits and alternatives to treatment. The patient was placed prone on the CT gantry and a pre procedural CT was performed re-demonstrating the known abscess/fluid collection within the lower pelvis with dominant collection within the right hemipelvis measuring approximately 6.8 x 3.8 cm (image 27, series 2 while smaller collection within the left hemipelvis measuring approximately 3.5 x 1.5 cm (image 25, series 2). The procedure was planned. A timeout was performed prior to the initiation of the procedure. The skin overlying the buttocks was prepped and draped in the usual sterile fashion. The overlying soft tissues were anesthetized with 1% lidocaine with epinephrine. Appropriate trajectory was planned with the use of a 22 gauge spinal needles. 57 gauge trocar needles were utilized to target both fluid collections within the lower pelvis utilizing a right and left trans gluteal approach. Appropriate position was confirmed with CT imaging and short Amplatz wires were coiled within both collections. The track was serially dilated allowing placement of a 10 [place] all-purpose drainage catheter into the dominant collection within the right hemipelvis yielding a total of 75 cc of  bloody, minimally complex fluid. A small amount of fluid was capped and sent to the laboratory for analysis. Appropriate positioning was confirmed and demonstrated near resolution of the smaller collection within the left hemipelvis. As such, the trocar needle directed towards the smaller collection with the left hemipelvis was exchanged for a Yueh sheath catheter which was utilized to aspirate approximately 5 cc of bloody, minimally complex fluid. The remaining right trans gluteal approach drainage catheter was secured to the skin entrance site within interrupted suture and a Stat Lock device. Dressings were applied. The patient tolerated the procedures well without immediate postprocedural complication. IMPRESSION: 1. Successful CT guided placement of a 10 French all purpose drain catheter into the right hemipelvis with aspiration of 75 cc of bloody, minimally complex fluid. Samples were sent to the laboratory as requested by the ordering clinical team. 2. Successful CT-guided aspiration of approximately 5 cc of bloody,  minimally complex fluid from the smaller collection within the left hemipelvis, too small to warrant percutaneous drainage. PLAN: Patient will return to the interventional radiology department on 12/11/2020 for ascites search ultrasound and potential paracentesis versus ultrasound-guided drainage catheter placement into additional fluid collection within the perihepatic space demonstrated on abdominal CT performed 12/09/2020. Electronically Signed   By: Sandi Mariscal M.D.   On: 12/11/2020 09:27   IR ABDOMEN US LIMITED  Result Date: 12/11/2020 CLINICAL DATA:  65 year old gentleman with ischemic bowel perforation, status post pelvic abscess drain placement, returns to IR for paracentesis. EXAM: ULTRASOUND ABDOMEN LIMITED COMPARISON:  None. FINDINGS: Sonographic evaluation of the abdomen demonstrates trace perihepatic fluid which is too small for aspiration. IMPRESSION: Trace perihepatic ascites is  insufficient for paracentesis. Electronically Signed   By: Miachel Roux M.D.   On: 12/11/2020 14:55   CT IMAGE GUIDED FLUID DRAIN BY CATHETER  Result Date: 12/11/2020 INDICATION: History of exploratory laparotomy and small-bowel resection for focal ischemic perforation of uncertain etiology, now with infectious symptoms and indeterminate fluid collections within the pelvis worrisome for abscesses. EXAM: 1. CT GUIDED RIGHT TRANS GLUTEAL APPROACH PERCUTANEOUS DRAINAGE CATHETER 2. CT-GUIDED LEFT TRANS GLUTEAL APPROACH PELVIC FLUID COLLECTION ASPIRATION COMPARISON:  None. MEDICATIONS: The patient is currently admitted to the hospital and receiving intravenous antibiotics. The antibiotics were administered within an appropriate time frame prior to the initiation of the procedure. ANESTHESIA/SEDATION: Fentanyl 150 mcg IV; Versed 4 mg IV administered by the interventional radiology nurse. Moderate Sedation Time: 34 minutes - the patient's level of consciousness and vital signs were monitored continuously by radiology nursing throughout the procedure under my direct supervision. CONTRAST:  None COMPLICATIONS: None immediate. PROCEDURE: Informed written consent was obtained from the patient after a discussion of the risks, benefits and alternatives to treatment. The patient was placed prone on the CT gantry and a pre procedural CT was performed re-demonstrating the known abscess/fluid collection within the lower pelvis with dominant collection within the right hemipelvis measuring approximately 6.8 x 3.8 cm (image 27, series 2 while smaller collection within the left hemipelvis measuring approximately 3.5 x 1.5 cm (image 25, series 2). The procedure was planned. A timeout was performed prior to the initiation of the procedure. The skin overlying the buttocks was prepped and draped in the usual sterile fashion. The overlying soft tissues were anesthetized with 1% lidocaine with epinephrine. Appropriate trajectory was  planned with the use of a 22 gauge spinal needles. 50 gauge trocar needles were utilized to target both fluid collections within the lower pelvis utilizing a right and left trans gluteal approach. Appropriate position was confirmed with CT imaging and short Amplatz wires were coiled within both collections. The track was serially dilated allowing placement of a 10 [place] all-purpose drainage catheter into the dominant collection within the right hemipelvis yielding a total of 75 cc of bloody, minimally complex fluid. A small amount of fluid was capped and sent to the laboratory for analysis. Appropriate positioning was confirmed and demonstrated near resolution of the smaller collection within the left hemipelvis. As such, the trocar needle directed towards the smaller collection with the left hemipelvis was exchanged for a Yueh sheath catheter which was utilized to aspirate approximately 5 cc of bloody, minimally complex fluid. The remaining right trans gluteal approach drainage catheter was secured to the skin entrance site within interrupted suture and a Stat Lock device. Dressings were applied. The patient tolerated the procedures well without immediate postprocedural complication. IMPRESSION: 1. Successful CT guided placement of a  10 [place] all purpose drain catheter into the right hemipelvis with aspiration of 75 cc of bloody, minimally complex fluid. Samples were sent to the laboratory as requested by the ordering clinical team. 2. Successful CT-guided aspiration of approximately 5 cc of bloody, minimally complex fluid from the smaller collection within the left hemipelvis, too small to warrant percutaneous drainage. PLAN: Patient will return to the interventional radiology department on 12/11/2020 for ascites search ultrasound and potential paracentesis versus ultrasound-guided drainage catheter placement into additional fluid collection within the perihepatic space demonstrated on abdominal CT performed  12/09/2020. Electronically Signed   By: Sandi Mariscal M.D.   On: 12/11/2020 09:27      Armandina Gemma 12/12/2020   Patient ID: Sheliah Mends, male   DOB: [birth year], 65 y.o.   MRN: [id number]

## 2020-12-12 NOTE — Progress Notes (Signed)
PHARMACY - TOTAL PARENTERAL NUTRITION CONSULT NOTE  Indication: Prolonged ileus  Patient Measurements: Weight: 60.3 kg (132 lb 15 oz)   Body mass index is 18.8 kg/m.  Assessment:  9 YOM with h/o rectal adenocarcinoma admitted 9/29 with nausea, abdominal pain, cramps and fever worsened over the last few weeks. CT with pneumoperitoneum.  9/30 ex lap with SBR for focal ischemic perforation of unclear etiology with post-op ileus. SBR with primary reanastomosis. Noted to appear cachectic with BMI 17.6 and recent weight loss.  High risk for refeeding due to acute illness and severe PCM with NPO status and weight loss.   Glucose / Insulin: A1c 5.9%, noted pre-DM. CBGs well controlled. Received 6 units SSI in past 24 hrs Electrolytes: Na 131, K 3.8 (goal >/= 4), Mag 1.8 (goal >/= 2), others WNL Renal: SCr < 1, BUN WNL Hepatic: LFTs mildly elevated, tbili WNL, albumin 2.1, TG 177 ID: Zosyn (9/30 >> ) for post-op abscess - afebrile, WBC down 12.6 10/6 abscess cx - E.faecalis  Intake / Output; MIVF: UOP 0.3 ml/kg/hr, NG 449mL, drain 34mL, net +8.5L GI Imaging: - 9/29: mild ascites and punctate foci of extraluminal gas within RUQ and biliary hilum - enteric perforation. - 10/5 CT: Two fluid collections in pelvis, question infected vs sterile collections. C/w postoperative ileus. GI Surgeries / Procedures:  - 9/30: s/p ex lap with SBR for focal ischemic perforation of unclear etiology - 10/6: Pelvic fluid collection, drain placed  Central access:  PICC 12/09/20 TPN start date: 12/09/20  Nutritional Goals:  RD Estimated Needs Total Energy Estimated Needs: 1900-2100 Total Protein Estimated Needs: 85-100 grams Total Fluid Estimated Needs: >/= 1.8 L  Current Nutrition:  TPN  Plan:  Continue TPN at goal rate of 80 ml/hr to provide 96g AA, 326g CHO and 48g ILE for a total of 1974 kCal, meeting 100% of patient needs  Electrolytes in TPN: increase Na 123mEq/L, increase K 57mEq/L, Ca 35mEq/L,  increase Mg 22mEq/L, Phos 22mmol/L, max CL for now Add standard MVI and trace elements to TPN Reduce sensitive SSI to Q8H - D/C soon if CBGs remain controlled  KCL x 3 runs Mag sulfate 2gm IV x 1 F/U AM labs, micro data Possible clamping trial this weekend  Hemi Chacko D. Mina Marble, PharmD, BCPS, [place] 12/12/2020, 8:34 AM

## 2020-12-12 NOTE — Plan of Care (Signed)

## 2020-12-13 LAB — BASIC METABOLIC PANEL
Anion gap: 7 (ref 5–15)
BUN: 12 mg/dL (ref 8–23)
CO2: 25 mmol/L (ref 22–32)
Calcium: 8.8 mg/dL — ABNORMAL LOW (ref 8.9–10.3)
Chloride: 100 mmol/L (ref 98–111)
Creatinine, Ser: 0.6 mg/dL — ABNORMAL LOW (ref 0.61–1.24)
GFR, Estimated: >60 mL/min (ref >60)
Glucose, Bld: 141 mg/dL — ABNORMAL HIGH (ref 70–99)
Potassium: 4.3 mmol/L (ref 3.5–5.1)
Sodium: 132 mmol/L — ABNORMAL LOW (ref 135–145)

## 2020-12-13 LAB — GLUCOSE, CAPILLARY
Glucose-Capillary: 124 mg/dL — ABNORMAL HIGH (ref 70–99)
Glucose-Capillary: 116 mg/dL — ABNORMAL HIGH (ref 70–99)
Glucose-Capillary: 115 mg/dL — ABNORMAL HIGH (ref 70–99)
Glucose-Capillary: 128 mg/dL — ABNORMAL HIGH (ref 70–99)

## 2020-12-13 LAB — MAGNESIUM: Magnesium: 1.9 mg/dL (ref 1.7–2.4)

## 2020-12-13 MED ORDER — MAGNESIUM SULFATE 2 GM/50ML IV SOLN
2.0000 g | Freq: Once | INTRAVENOUS | Status: AC
  Administered 2020-12-13: 2 g via INTRAVENOUS
  Filled 2020-12-13: qty 50

## 2020-12-13 MED ORDER — HYDROCHLOROTHIAZIDE 25 MG PO TABS
25.0000 mg | ORAL_TABLET | Freq: Every day | ORAL | Status: DC
  Administered 2020-12-13 – 2020-12-21 (×9): 25 mg via ORAL
  Filled 2020-12-13 (×9): qty 1

## 2020-12-13 MED ORDER — POTASSIUM CHLORIDE 2 MEQ/ML IV SOLN
INTRAVENOUS | Status: AC
Start: 2020-12-13 — End: 2020-12-14
  Filled 2020-12-13: qty 960

## 2020-12-13 NOTE — Progress Notes (Signed)
PHARMACY - TOTAL PARENTERAL NUTRITION CONSULT NOTE  Indication: Prolonged ileus  Patient Measurements: Weight: 60.3 kg (132 lb 15 oz)   Body mass index is 18.8 kg/m.  Assessment:  77 YOM with h/o rectal adenocarcinoma admitted 9/29 with nausea, abdominal pain, cramps and fever worsened over the last few weeks. CT with pneumoperitoneum.  9/30 ex lap with SBR for focal ischemic perforation of unclear etiology with post-op ileus. SBR with primary reanastomosis. Noted to appear cachectic with BMI 17.6 and recent weight loss.  High risk for refeeding due to acute illness and severe PCM with NPO status and weight loss.   Glucose / Insulin: A1c 5.9%, noted pre-DM. CBGs mostly controlled. Received 3 units SSI in past 24 hrs Electrolytes: Na up to 132, K 4.3 post 3 runs (goal >/= 4), Mag 1.9 post 2gm (goal >/= 2), others WNL (CoCa high normal at 10.3) Renal: SCr < 1, BUN WNL Hepatic: LFTs mildly elevated, tbili WNL, albumin 2.1, TG 177 ID: Zosyn (9/30 >> ) for post-op abscess - afebrile, WBC down 12.6 10/6 abscess cx - E.faecalis and pan sensitive E.coli, preliminary result Intake / Output; MIVF: UOP 1.5 ml/kg/hr, NG 460mL, drain 26mL, net +7.5L GI Imaging: - 9/29: mild ascites and punctate foci of extraluminal gas within RUQ and biliary hilum - enteric perforation. - 10/5 CT: Two fluid collections in pelvis, question infected vs sterile collections. C/w postoperative ileus. GI Surgeries / Procedures:  - 9/30: s/p ex lap with SBR for focal ischemic perforation of unclear etiology - 10/6: Pelvic fluid collection, drain placed  Central access:  PICC 12/09/20 TPN start date: 12/09/20  Nutritional Goals:  RD Estimated Needs Total Energy Estimated Needs: 1900-2100 Total Protein Estimated Needs: 85-100 grams Total Fluid Estimated Needs: >/= 1.8 L  Current Nutrition:  TPN CLD started 10/8  Plan:  Continue TPN at goal rate of 80 ml/hr to provide 96g AA, 326g CHO and 48g ILE for a total of 1974  kCal, meeting 100% of patient needs  Electrolytes in TPN: increase Na 168mEq/L, K 46mEq/L, reduce Ca to 70mEq/L, increase Mg to 59mEq/L, Phos 59mmol/L, max CL for now Add standard MVI and trace elements to TPN Continue sensitive SSI Q8H Repeat Mag sulfate 2gm IV x 1 Standard TPN labs on Mon and Thurs F/U micro data, PO intake/diet advancement to start weaning TPN  Jamaine Quintin D. Mina Marble, PharmD, BCPS, [place] 12/13/2020, 8:10 AM

## 2020-12-13 NOTE — Progress Notes (Signed)
Assessment & Plan: POD#9 - s/p ex lap with SBR for focal ischemic perforation - Dr. Zenia Resides 12/04/20 - IR drain in RLQ 10/6 - postop ileus - tolerated NG clamped, tolerated clear liquid diet - discontinue NG, advance to fulls - mobilize, pulm toilet - multi-modal pain control - IV robaxin, IV tylenol , lidoderm patch   FEN - IVFs, PICC/TPN 10/5 >> VTE - lovenox ID - [email or web address]   Hx of rectal adenocarcinoma Hx of DVT HTN ABL on chronic anemia  Will restart home BP med.        Armandina Gemma, MD       [facility]       Office: [phone]   Chief Complaint: Small bowel perforation  Subjective: Patient up in chair, no further diarrhea.  Tolerated NG clamped and clear liquid diet.  Objective: Vital signs in last 24 hours: Temp:  [98.6 F (37 C)-98.7 F (37.1 C)] 98.6 F (37 C) (10/09 0501) Pulse Rate:  [90-97] 90 (10/09 0501) Resp:  [16-17] 16 (10/09 0501) BP: (132-156)/(86-95) 144/86 (10/09 0501) SpO2:  [100 %] 100 % (10/09 0501) Last BM Date: 12/12/20  Intake/Output from previous day: 10/08 0701 - 10/09 0700 In: 1198.5 [I.V.:1044.6; IV Piggyback:149] Out: 2190 [Urine:1950; Emesis/NG output:230; Drains:10] Intake/Output this shift: No intake/output data recorded.  Physical Exam: HEENT - sclerae clear, mucous membranes moist Neck - soft Abdomen - soft; drain with small serous output Ext - no edema, non-tender Neuro - alert & oriented, no focal deficits  Lab Results:  Recent Labs    12/11/20 0350  WBC 12.6*  HGB 10.5*  HCT 32.1*  PLT 427*   BMET Recent Labs    12/12/20 0406 12/13/20 0322  NA 131* 132*  K 3.8 4.3  CL 101 100  CO2 25 25  GLUCOSE 134* 141*  BUN 12 12  CREATININE 0.54* 0.60*  CALCIUM 8.6* 8.8*   PT/INR No results for input(s): LABPROT, INR in the last 72 hours. Comprehensive Metabolic Panel:    Component Value Date/Time   NA 132 (L) 12/13/2020 0322   NA 131 (L) 12/12/2020 0406   NA 139 12/27/2016 1237   NA  137 11/21/2016 0928   K 4.3 12/13/2020 0322   K 3.8 12/12/2020 0406   K 4.2 12/27/2016 1237   K 4.0 11/21/2016 0928   CL 100 12/13/2020 0322   CL 101 12/12/2020 0406   CO2 25 12/13/2020 0322   CO2 25 12/12/2020 0406   CO2 25 12/27/2016 1237   CO2 26 11/21/2016 0928   BUN 12 12/13/2020 0322   BUN 12 12/12/2020 0406   BUN 15.2 12/27/2016 1237   BUN 15.5 11/21/2016 0928   CREATININE 0.60 (L) 12/13/2020 0322   CREATININE 0.54 (L) 12/12/2020 0406   CREATININE 1.1 12/27/2016 1237   CREATININE 1.1 11/21/2016 0928   GLUCOSE 141 (H) 12/13/2020 0322   GLUCOSE 134 (H) 12/12/2020 0406   GLUCOSE 112 12/27/2016 1237   GLUCOSE 90 11/21/2016 0928   CALCIUM 8.8 (L) 12/13/2020 0322   CALCIUM 8.6 (L) 12/12/2020 0406   CALCIUM 9.7 12/27/2016 1237   CALCIUM 9.8 11/21/2016 0928   AST 75 (H) 12/12/2020 0406   AST 32 12/10/2020 0404   AST 20 12/27/2016 1237   AST 18 11/21/2016 0928   ALT 74 (H) 12/12/2020 0406   ALT 33 12/10/2020 0404   ALT 18 12/27/2016 1237   ALT 14 11/21/2016 0928   ALKPHOS 160 (H) 12/12/2020 0406   ALKPHOS 161 (  H) 12/10/2020 0404   ALKPHOS 80 12/27/2016 1237   ALKPHOS 75 11/21/2016 0928   BILITOT 0.4 12/12/2020 0406   BILITOT 0.7 12/10/2020 0404   BILITOT 0.45 12/27/2016 1237   BILITOT 0.41 11/21/2016 0928   PROT 5.3 (L) 12/12/2020 0406   PROT 5.4 (L) 12/10/2020 0404   PROT 7.6 12/27/2016 1237   PROT 7.1 11/21/2016 0928   ALBUMIN 2.1 (L) 12/12/2020 0406   ALBUMIN 2.0 (L) 12/10/2020 0404   ALBUMIN 4.1 12/27/2016 1237   ALBUMIN 3.8 11/21/2016 0928    Studies/Results: IR ABDOMEN US LIMITED  Result Date: 12/11/2020 CLINICAL DATA:  65 year old gentleman with ischemic bowel perforation, status post pelvic abscess drain placement, returns to IR for paracentesis. EXAM: ULTRASOUND ABDOMEN LIMITED COMPARISON:  None. FINDINGS: Sonographic evaluation of the abdomen demonstrates trace perihepatic fluid which is too small for aspiration. IMPRESSION: Trace perihepatic ascites is  insufficient for paracentesis. Electronically Signed   By: Miachel Roux M.D.   On: 12/11/2020 14:55      Armandina Gemma 12/13/2020   Patient ID: Juan David, male   DOB: May 04, 1955, 65 y.o.   MRN: [id number]

## 2020-12-14 LAB — COMPREHENSIVE METABOLIC PANEL
ALT: 128 U/L — ABNORMAL HIGH (ref 0–44)
AST: 88 U/L — ABNORMAL HIGH (ref 15–41)
Albumin: 2.2 g/dL — ABNORMAL LOW (ref 3.5–5.0)
Alkaline Phosphatase: 149 U/L — ABNORMAL HIGH (ref 38–126)
Anion gap: 6 (ref 5–15)
BUN: 11 mg/dL (ref 8–23)
CO2: 26 mmol/L (ref 22–32)
Calcium: 8.8 mg/dL — ABNORMAL LOW (ref 8.9–10.3)
Chloride: 101 mmol/L (ref 98–111)
Creatinine, Ser: 0.65 mg/dL (ref 0.61–1.24)
GFR, Estimated: >60 mL/min (ref >60)
Glucose, Bld: 124 mg/dL — ABNORMAL HIGH (ref 70–99)
Potassium: 4.6 mmol/L (ref 3.5–5.1)
Sodium: 133 mmol/L — ABNORMAL LOW (ref 135–145)
Total Bilirubin: 0.2 mg/dL — ABNORMAL LOW (ref 0.3–1.2)
Total Protein: 5.7 g/dL — ABNORMAL LOW (ref 6.5–8.1)

## 2020-12-14 LAB — AEROBIC/ANAEROBIC CULTURE W GRAM STAIN (SURGICAL/DEEP WOUND)

## 2020-12-14 LAB — GLUCOSE, CAPILLARY
Glucose-Capillary: 125 mg/dL — ABNORMAL HIGH (ref 70–99)
Glucose-Capillary: 114 mg/dL — ABNORMAL HIGH (ref 70–99)
Glucose-Capillary: 116 mg/dL — ABNORMAL HIGH (ref 70–99)
Glucose-Capillary: 123 mg/dL — ABNORMAL HIGH (ref 70–99)
Glucose-Capillary: 126 mg/dL — ABNORMAL HIGH (ref 70–99)

## 2020-12-14 LAB — MAGNESIUM: Magnesium: 2 mg/dL (ref 1.7–2.4)

## 2020-12-14 LAB — PHOSPHORUS: Phosphorus: 3.5 mg/dL (ref 2.5–4.6)

## 2020-12-14 LAB — TRIGLYCERIDES: Triglycerides: 76 mg/dL (ref <150)

## 2020-12-14 MED ORDER — TRACE MINERALS CU-MN-SE-ZN 300-55-60-3000 MCG/ML IV SOLN
INTRAVENOUS | Status: AC
  Filled 2020-12-14: qty 960

## 2020-12-14 NOTE — Progress Notes (Signed)
PHARMACY - TOTAL PARENTERAL NUTRITION CONSULT NOTE  Indication: Prolonged ileus/small bowel perforation  Patient Measurements: Weight: 56.5 kg (124 lb 8 oz)   Body mass index is 17.61 kg/m.  Assessment:  70 YOM with h/o rectal adenocarcinoma admitted 9/29 with nausea, abdominal pain, cramps and fever worsened over the last few weeks. CT with pneumoperitoneum.  9/30 ex lap with SBR for focal ischemic perforation of unclear etiology with post-op ileus. SBR with primary reanastomosis. Noted to appear cachectic with BMI 17.6 and recent weight loss.  High risk for refeeding due to acute illness and severe PCM with NPO status and weight loss.   Glucose / Insulin: A1c 5.9%, noted pre-DM. CBGs controlled. Received 2 units SSI in past 24 hrs Electrolytes: Na 133, K 4.6  (goal >/= 4), Mag 2 post 2gm (goal >/= 2), CoCa high normal at 10.2, others WNL  Renal: SCr < 1, BUN WNL Hepatic: LFTs mildly elevated, tbili WNL, albumin 2.2, TG 76 ID: Zosyn (9/30 >> ) for post-op abscess growing Ecoli and E faecalis, possible anaerobe  10/6 abscess cx - E.faecalis and pan sensitive E.coli, preliminary result Intake / Output; MIVF: NS @ 10 m/hr, UOP 1.6 ml/kg/hr,  drain 0 mL charted, net +8.5L GI Imaging: 9/29: mild ascites and punctate foci of extraluminal gas within RUQ and biliary hilum - enteric perforation. 10/5 CT: Two fluid collections in pelvis, question infected vs sterile collections. C/w postoperative ileus. GI Surgeries / Procedures:  9/30: s/p ex lap with SBR for focal ischemic perforation of unclear etiology 10/6: Pelvic fluid collection, drain placed 10/7 L hemipelvis 77ml fluid aspiration  Central access:  PICC 12/09/20 TPN start date: 12/09/20  Nutritional Goals:  RD Estimated Needs Total Energy Estimated Needs: 1900-2100 Total Protein Estimated Needs: 85-100 grams Total Fluid Estimated Needs: >/= 1.8 L  Current Nutrition:  TPN 10/8 CLD  10/9 FLD- only ate 1 bowl of cream of chicken  soup, no appetite per patient  Plan:  Cycle TPN over 18 hours (rate 56-113 ml/hr) to stimulate appetite. Will provide 96g AA, 326g CHO and 48g ILE for a total of 1974 kCal, meeting 100% of patient needs  Electrolytes in TPN: Decrease K to 68mEq/L; Continue Na 116mEq/L, Ca 2 mEq/L, Mg 8 mEq/L, Phos 15 mmol/L, max CL   Add standard MVI and trace elements to TPN Stop sensitive SSI  Standard TPN labs 10/11 and on Mon and Thurs F/U micro data, PO intake/diet advancement to start weaning [place], PharmD, BCPS, [facility] Clinical Pharmacist  Please check AMION for all [place] phone numbers After 10:00 PM, call [place]

## 2020-12-14 NOTE — Progress Notes (Signed)
Referring Physician(s): * No referring provider recorded for this case *  Supervising Physician: Sandi Mariscal  Patient Status:  [facility] - In-pt  Chief Complaint:  Right hemi-pelvis fluid collection drain.  Subjective:  Pt resting in bed with wife at bedside.  He is A&O, calm and pleasant. He denies pain at site.  ~50 cc orange colored drainage to gravity bag with no documentation in Epic.  Dressing noted to be wet with yellowish drainage and small amount of dried blood.  Site is unremarkable with sutures and statlock in place RN states she will change dressing.    Allergies: Patient has no known allergies.  Medications: Prior to Admission medications   Medication Sig Start Date End Date Taking? Authorizing Provider  dicyclomine (BENTYL) 20 MG tablet Take 1 tablet (20 mg total) by mouth 3 (three) times daily as needed for spasms (before meals). 12/01/20  Yes Gatha Mayer, MD  hydrochlorothiazide (HYDRODIURIL) 50 MG tablet Take 1/2 of a tablet by mouth every 24 hours for hypertension. Patient taking differently: Take 25 mg by mouth daily. 11/04/20  Yes   omeprazole (PRILOSEC) 40 MG capsule Take 40 mg by mouth daily.   Yes [provider]  timolol (TIMOPTIC) 0.5 % ophthalmic solution Place 1 drop into the left eye every morning.  11/19/14  Yes [provider]     Vital Signs: BP 124/82 (BP Location: Left Arm)   Pulse 93   Temp 98.9 F (37.2 C) (Oral)   Resp 16   Wt 124 lb 8 oz (56.5 kg)   SpO2 98%   BMI 17.61 kg/m   Physical Exam Vitals reviewed.  HENT:     Head: Normocephalic and atraumatic.  Cardiovascular:     Rate and Rhythm: Tachycardia present.  Pulmonary:     Effort: Pulmonary effort is normal.  Abdominal:     Comments: right side hemi-pelvis drain unremarkable   Skin:    General: Skin is warm and dry.  Neurological:     Mental Status: He is alert and oriented to person, place, and time.    Imaging: CT ASPIRATION  Result Date:  12/11/2020 INDICATION: History of exploratory laparotomy and small-bowel resection for focal ischemic perforation of uncertain etiology, now with infectious symptoms and indeterminate fluid collections within the pelvis worrisome for abscesses. EXAM: 1. CT GUIDED RIGHT TRANS GLUTEAL APPROACH PERCUTANEOUS DRAINAGE CATHETER 2. CT-GUIDED LEFT TRANS GLUTEAL APPROACH PELVIC FLUID COLLECTION ASPIRATION COMPARISON:  None. MEDICATIONS: The patient is currently admitted to the hospital and receiving intravenous antibiotics. The antibiotics were administered within an appropriate time frame prior to the initiation of the procedure. ANESTHESIA/SEDATION: Fentanyl 150 mcg IV; Versed 4 mg IV administered by the interventional radiology nurse. Moderate Sedation Time: 34 minutes - the patient's level of consciousness and vital signs were monitored continuously by radiology nursing throughout the procedure under my direct supervision. CONTRAST:  None COMPLICATIONS: None immediate. PROCEDURE: Informed written consent was obtained from the patient after a discussion of the risks, benefits and alternatives to treatment. The patient was placed prone on the CT gantry and a pre procedural CT was performed re-demonstrating the known abscess/fluid collection within the lower pelvis with dominant collection within the right hemipelvis measuring approximately 6.8 x 3.8 cm (image 27, series 2 while smaller collection within the left hemipelvis measuring approximately 3.5 x 1.5 cm (image 25, series 2). The procedure was planned. A timeout was performed prior to the initiation of the procedure. The skin overlying the buttocks was prepped and  draped in the usual sterile fashion. The overlying soft tissues were anesthetized with 1% lidocaine with epinephrine. Appropriate trajectory was planned with the use of a 22 gauge spinal needles. 52 gauge trocar needles were utilized to target both fluid collections within the lower pelvis utilizing a right  and left trans gluteal approach. Appropriate position was confirmed with CT imaging and short Amplatz wires were coiled within both collections. The track was serially dilated allowing placement of a 10 [place] all-purpose drainage catheter into the dominant collection within the right hemipelvis yielding a total of 75 cc of bloody, minimally complex fluid. A small amount of fluid was capped and sent to the laboratory for analysis. Appropriate positioning was confirmed and demonstrated near resolution of the smaller collection within the left hemipelvis. As such, the trocar needle directed towards the smaller collection with the left hemipelvis was exchanged for a Yueh sheath catheter which was utilized to aspirate approximately 5 cc of bloody, minimally complex fluid. The remaining right trans gluteal approach drainage catheter was secured to the skin entrance site within interrupted suture and a Stat Lock device. Dressings were applied. The patient tolerated the procedures well without immediate postprocedural complication. IMPRESSION: 1. Successful CT guided placement of a 10 French all purpose drain catheter into the right hemipelvis with aspiration of 75 cc of bloody, minimally complex fluid. Samples were sent to the laboratory as requested by the ordering clinical team. 2. Successful CT-guided aspiration of approximately 5 cc of bloody, minimally complex fluid from the smaller collection within the left hemipelvis, too small to warrant percutaneous drainage. PLAN: Patient will return to the interventional radiology department on 12/11/2020 for ascites search ultrasound and potential paracentesis versus ultrasound-guided drainage catheter placement into additional fluid collection within the perihepatic space demonstrated on abdominal CT performed 12/09/2020. Electronically Signed   By: Sandi Mariscal M.D.   On: 12/11/2020 09:27   IR ABDOMEN US LIMITED  Result Date: 12/11/2020 CLINICAL DATA:  65 year old  gentleman with ischemic bowel perforation, status post pelvic abscess drain placement, returns to IR for paracentesis. EXAM: ULTRASOUND ABDOMEN LIMITED COMPARISON:  None. FINDINGS: Sonographic evaluation of the abdomen demonstrates trace perihepatic fluid which is too small for aspiration. IMPRESSION: Trace perihepatic ascites is insufficient for paracentesis. Electronically Signed   By: Miachel Roux M.D.   On: 12/11/2020 14:55   CT IMAGE GUIDED FLUID DRAIN BY CATHETER  Result Date: 12/11/2020 INDICATION: History of exploratory laparotomy and small-bowel resection for focal ischemic perforation of uncertain etiology, now with infectious symptoms and indeterminate fluid collections within the pelvis worrisome for abscesses. EXAM: 1. CT GUIDED RIGHT TRANS GLUTEAL APPROACH PERCUTANEOUS DRAINAGE CATHETER 2. CT-GUIDED LEFT TRANS GLUTEAL APPROACH PELVIC FLUID COLLECTION ASPIRATION COMPARISON:  None. MEDICATIONS: The patient is currently admitted to the hospital and receiving intravenous antibiotics. The antibiotics were administered within an appropriate time frame prior to the initiation of the procedure. ANESTHESIA/SEDATION: Fentanyl 150 mcg IV; Versed 4 mg IV administered by the interventional radiology nurse. Moderate Sedation Time: 34 minutes - the patient's level of consciousness and vital signs were monitored continuously by radiology nursing throughout the procedure under my direct supervision. CONTRAST:  None COMPLICATIONS: None immediate. PROCEDURE: Informed written consent was obtained from the patient after a discussion of the risks, benefits and alternatives to treatment. The patient was placed prone on the CT gantry and a pre procedural CT was performed re-demonstrating the known abscess/fluid collection within the lower pelvis with dominant collection within the right hemipelvis measuring approximately 6.8 x 3.8 cm (image  27, series 2 while smaller collection within the left hemipelvis measuring  approximately 3.5 x 1.5 cm (image 25, series 2). The procedure was planned. A timeout was performed prior to the initiation of the procedure. The skin overlying the buttocks was prepped and draped in the usual sterile fashion. The overlying soft tissues were anesthetized with 1% lidocaine with epinephrine. Appropriate trajectory was planned with the use of a 22 gauge spinal needles. 63 gauge trocar needles were utilized to target both fluid collections within the lower pelvis utilizing a right and left trans gluteal approach. Appropriate position was confirmed with CT imaging and short Amplatz wires were coiled within both collections. The track was serially dilated allowing placement of a 10 [place] all-purpose drainage catheter into the dominant collection within the right hemipelvis yielding a total of 75 cc of bloody, minimally complex fluid. A small amount of fluid was capped and sent to the laboratory for analysis. Appropriate positioning was confirmed and demonstrated near resolution of the smaller collection within the left hemipelvis. As such, the trocar needle directed towards the smaller collection with the left hemipelvis was exchanged for a Yueh sheath catheter which was utilized to aspirate approximately 5 cc of bloody, minimally complex fluid. The remaining right trans gluteal approach drainage catheter was secured to the skin entrance site within interrupted suture and a Stat Lock device. Dressings were applied. The patient tolerated the procedures well without immediate postprocedural complication. IMPRESSION: 1. Successful CT guided placement of a 10 French all purpose drain catheter into the right hemipelvis with aspiration of 75 cc of bloody, minimally complex fluid. Samples were sent to the laboratory as requested by the ordering clinical team. 2. Successful CT-guided aspiration of approximately 5 cc of bloody, minimally complex fluid from the smaller collection within the left hemipelvis, too  small to warrant percutaneous drainage. PLAN: Patient will return to the interventional radiology department on 12/11/2020 for ascites search ultrasound and potential paracentesis versus ultrasound-guided drainage catheter placement into additional fluid collection within the perihepatic space demonstrated on abdominal CT performed 12/09/2020. Electronically Signed   By: Sandi Mariscal M.D.   On: 12/11/2020 09:27    Labs:  CBC: Recent Labs    12/07/20 0406 12/08/20 0115 12/09/20 0114 12/11/20 0350  WBC 15.5* 14.2* 14.7* 12.6*  HGB 11.4* 10.5* 11.0* 10.5*  HCT 32.8* 31.0* 32.0* 32.1*  PLT 299 301 339 427*    COAGS: Recent Labs    12/10/20 0404  INR 1.1    BMP: Recent Labs    12/11/20 0350 12/12/20 0406 12/13/20 0322 12/14/20 0326  NA 130* 131* 132* 133*  K 3.6 3.8 4.3 4.6  CL 98 101 100 101  CO2 26 25 25 26   GLUCOSE 149* 134* 141* 124*  BUN 13 12 12 11   CALCIUM 8.5* 8.6* 8.8* 8.8*  CREATININE 0.62 0.54* 0.60* 0.65  GFRNONAA >60 >60 >60 >60    LIVER FUNCTION TESTS: Recent Labs    12/03/20 2023 12/10/20 0404 12/12/20 0406 12/14/20 0326  BILITOT 0.3 0.7 0.4 0.2*  AST 15 32 75* 88*  ALT 13 33 74* 128*  ALKPHOS 78 161* 160* 149*  PROT 6.7 5.4* 5.3* 5.7*  ALBUMIN 3.8 2.0* 2.1* 2.2*    Assessment and Plan:   Pt resting in bed with wife at bedside.  He is A&O, calm and pleasant. He denies pain at site.  ~50 cc orange colored drainage to gravity bag with no documentation in Epic.  Dressing noted to be wet with yellowish drainage  and small amount of dried blood.  Site is unremarkable with sutures and statlock in place RN states she will change dressing.      Document drain OP in Epic. Change dressing q shift or as needed. Call IR with questions or concerns.    Electronically Signed: Tyson Alias, NP 12/14/2020, 2:54 PM   I spent a total of 15 Minutes at the the patient's bedside AND on the patient's hospital floor or unit, greater than 50% of which  was counseling/coordinating care for hemi-pelvic fluid collection drain.

## 2020-12-14 NOTE — Progress Notes (Signed)
Progress Note  10 Days Post-Op  Subjective: Advanced to fulls yesterday. Tolerating without nausea, emesis, or abdominal pain but does have low appetite and feels bloated with minimal intake. Passing flatus and liquid stool. Ambulating.   Some pain in left shoulder that feels like an ache and is improved with heating pad  Objective: Vital signs in last 24 hours: Temp:  [98 F (36.7 C)-98.9 F (37.2 C)] 98.9 F (37.2 C) (10/10 0847) Pulse Rate:  [80-96] 93 (10/10 0847) Resp:  [16-18] 16 (10/10 0847) BP: (124-162)/(82-88) 124/82 (10/10 0847) SpO2:  [97 %-100 %] 98 % (10/10 0847) Weight:  [56.5 kg] 56.5 kg (10/10 0558) Last BM Date: 12/13/20  Intake/Output from previous day: 10/09 0701 - 10/10 0700 In: 2029.8 [I.V.:1354.8; IV Piggyback:675] Out: 1100 [Urine:1100] Intake/Output this shift: No intake/output data recorded.  PE: General: pleasant, WD, thin male who is sitting up in chair in NAD HEENT: head is normocephalic, atraumatic. Mouth is pink and moist Heart: regular, rate, and rhythm.  Palpable radial pulses bilaterally Lungs: CTAB, no wheezes, rhonchi, or rales noted.  Respiratory effort nonlabored Abd: soft, ND, +BS, mild appropriate TTP around incision and along lower quadrants. No rebound or guarding. Staples intact. Most distal portion of wound draining opaque blood tinged tan fluid. No surrounding erythema or induration MSK: all 4 extremities are symmetrical with no cyanosis, clubbing, or edema. No calf TTP bilaterally Skin: warm and dry Psych: A&Ox3 with an appropriate affect.    Lab Results:  No results for input(s): WBC, HGB, HCT, PLT in the last 72 hours. BMET Recent Labs    12/13/20 0322 12/14/20 0326  NA 132* 133*  K 4.3 4.6  CL 100 101  CO2 25 26  GLUCOSE 141* 124*  BUN 12 11  CREATININE 0.60* 0.65  CALCIUM 8.8* 8.8*   PT/INR No results for input(s): LABPROT, INR in the last 72 hours. CMP     Component Value Date/Time   NA 133 (L)  12/14/2020 0326   NA 139 12/27/2016 1237   K 4.6 12/14/2020 0326   K 4.2 12/27/2016 1237   CL 101 12/14/2020 0326   CO2 26 12/14/2020 0326   CO2 25 12/27/2016 1237   GLUCOSE 124 (H) 12/14/2020 0326   GLUCOSE 112 12/27/2016 1237   BUN 11 12/14/2020 0326   BUN 15.2 12/27/2016 1237   CREATININE 0.65 12/14/2020 0326   CREATININE 1.1 12/27/2016 1237   CALCIUM 8.8 (L) 12/14/2020 0326   CALCIUM 9.7 12/27/2016 1237   PROT 5.7 (L) 12/14/2020 0326   PROT 7.6 12/27/2016 1237   ALBUMIN 2.2 (L) 12/14/2020 0326   ALBUMIN 4.1 12/27/2016 1237   AST 88 (H) 12/14/2020 0326   AST 20 12/27/2016 1237   ALT 128 (H) 12/14/2020 0326   ALT 18 12/27/2016 1237   ALKPHOS 149 (H) 12/14/2020 0326   ALKPHOS 80 12/27/2016 1237   BILITOT 0.2 (L) 12/14/2020 0326   BILITOT 0.45 12/27/2016 1237   GFRNONAA >60 12/14/2020 0326   GFRAA >60 11/20/2017 0948   Lipase     Component Value Date/Time   LIPASE 17 12/03/2020 2023       Studies/Results: No results found.  Anti-infectives: Anti-infectives (From admission, onward)    Start     Dose/Rate Route Frequency Ordered Stop   12/04/20 0700  piperacillin-tazobactam (ZOSYN) IVPB 3.375 g        3.375 g 12.5 mL/hr over 240 Minutes Intravenous Every 8 hours 12/04/20 0218     12/03/20 2315  piperacillin-tazobactam (  ZOSYN) IVPB 3.375 g        3.375 g 100 mL/hr over 30 Minutes Intravenous  Once 12/03/20 2308 12/04/20 0002        Assessment/Plan  POD#10 - s/p ex lap with SBR for focal ischemic perforation - Dr. Zenia Resides 12/04/20 - IR drain in RLQ 10/6 - postop ileus - NGT out 10/9 and advanced to FLD. Tolerating well but low appetite - mobilize, pulm toilet - multi-modal pain control - IV robaxin, IV tylenol , lidoderm patch - lower portion of wound draining - monitor. Will discuss with MD and may remove some staples today - check CBC tomorrow am   FEN - FLD, IVFs, PICC/TPN 10/5 >> VTE - lovenox ID - zosyn 9/29 >>   Hx of rectal adenocarcinoma Hx of  DVT HTN ABL on chronic anemia     LOS: 10 days    Winferd Humphrey, [facility] 12/14/2020, 9:39 AM Please see Amion for pager number during day hours 7:00am-4:30pm

## 2020-12-15 LAB — BASIC METABOLIC PANEL
Anion gap: 5 (ref 5–15)
BUN: 13 mg/dL (ref 8–23)
CO2: 25 mmol/L (ref 22–32)
Calcium: 8.8 mg/dL — ABNORMAL LOW (ref 8.9–10.3)
Chloride: 102 mmol/L (ref 98–111)
Creatinine, Ser: 0.63 mg/dL (ref 0.61–1.24)
GFR, Estimated: >60 mL/min (ref >60)
Glucose, Bld: 108 mg/dL — ABNORMAL HIGH (ref 70–99)
Potassium: 4.3 mmol/L (ref 3.5–5.1)
Sodium: 132 mmol/L — ABNORMAL LOW (ref 135–145)

## 2020-12-15 LAB — GLUCOSE, CAPILLARY: Glucose-Capillary: 121 mg/dL — ABNORMAL HIGH (ref 70–99)

## 2020-12-15 LAB — CBC
HCT: 29.3 % — ABNORMAL LOW (ref 39.0–52.0)
Hemoglobin: 9.5 g/dL — ABNORMAL LOW (ref 13.0–17.0)
MCH: 29 pg (ref 26.0–34.0)
MCHC: 32.4 g/dL (ref 30.0–36.0)
MCV: 89.3 fL (ref 80.0–100.0)
Platelets: 590 10*3/uL — ABNORMAL HIGH (ref 150–400)
RBC: 3.28 MIL/uL — ABNORMAL LOW (ref 4.22–5.81)
RDW: 14 % (ref 11.5–15.5)
WBC: 12.9 10*3/uL — ABNORMAL HIGH (ref 4.0–10.5)
nRBC: 0 % (ref 0.0–0.2)

## 2020-12-15 LAB — MAGNESIUM: Magnesium: 1.9 mg/dL (ref 1.7–2.4)

## 2020-12-15 LAB — PHOSPHORUS: Phosphorus: 3.2 mg/dL (ref 2.5–4.6)

## 2020-12-15 MED ORDER — ACETAMINOPHEN 325 MG PO TABS: 650.0000 mg | ORAL_TABLET | Freq: Four times a day (QID) | ORAL | Status: DC | PRN

## 2020-12-15 MED ORDER — TRAVASOL 10 % IV SOLN
INTRAVENOUS | Status: AC
  Filled 2020-12-15: qty 960

## 2020-12-15 MED ORDER — OXYCODONE HCL 5 MG PO TABS
5.0000 mg | ORAL_TABLET | ORAL | Status: DC | PRN
  Administered 2020-12-18 – 2020-12-21 (×5): 10 mg via ORAL
  Filled 2020-12-15 (×7): qty 2

## 2020-12-15 MED ORDER — DEXTROSE 10 % IV SOLN: INTRAVENOUS | Status: AC

## 2020-12-15 MED ORDER — ENSURE ENLIVE PO LIQD
237.0000 mL | Freq: Two times a day (BID) | ORAL | Status: DC
  Administered 2020-12-15 – 2020-12-21 (×9): 237 mL via ORAL

## 2020-12-15 MED ORDER — AMOXICILLIN-POT CLAVULANATE 875-125 MG PO TABS
1.0000 | ORAL_TABLET | Freq: Two times a day (BID) | ORAL | Status: DC
  Administered 2020-12-15 – 2020-12-21 (×13): 1 via ORAL
  Filled 2020-12-15 (×13): qty 1

## 2020-12-15 NOTE — Progress Notes (Signed)
Progress Note  11 Days Post-Op  Subjective: Tolerating fulls without nausea, emesis, or abdominal pain but continues to have low appetite and feels bloated with intake. Passing flatus and liquid stool. Ambulating. Pain well controlled  Pain in left shoulder persists and is stable.   Objective: Vital signs in last 24 hours: Temp:  [98.4 F (36.9 C)-99.1 F (37.3 C)] 99.1 F (37.3 C) (10/11 0807) Pulse Rate:  [88-91] 91 (10/11 0807) Resp:  [17-18] 17 (10/11 0807) BP: (136-148)/(83-89) 139/89 (10/11 0807) SpO2:  [98 %-100 %] 98 % (10/11 0807) Last BM Date: 12/14/20  Intake/Output from previous day: 10/10 0701 - 10/11 0700 In: 1111 [P.O.:600; I.V.:401; IV Piggyback:100] Out: 625 [Urine:625] Intake/Output this shift: No intake/output data recorded.  PE: General: pleasant, WD, thin male who is sitting up in chair in NAD HEENT: head is normocephalic, atraumatic. Mouth is pink and moist Heart: regular, rate, and rhythm.  Palpable radial pulses bilaterally Lungs: CTAB, no wheezes, rhonchi, or rales noted.  Respiratory effort nonlabored Abd: soft, ND, +BS, mild appropriate TTP around incision and along lower quadrants. No rebound or guarding. Staples intact in proximal wound. Most distal portion of wound opened and additional staple out today. Purulent drainage at base of wound surrounded by granulation tissue and appropriate bleeding. Fascial stitch visible and intact. No surrounding erythema or induration MSK: all 4 extremities are symmetrical with no cyanosis, clubbing, or edema. No calf TTP bilaterally Skin: warm and dry Psych: A&Ox3 with an appropriate affect.    Lab Results:  Recent Labs    12/15/20 0306  WBC 12.9*  HGB 9.5*  HCT 29.3*  PLT 590*   BMET Recent Labs    12/14/20 0326 12/15/20 0306  NA 133* 132*  K 4.6 4.3  CL 101 102  CO2 26 25  GLUCOSE 124* 108*  BUN 11 13  CREATININE 0.65 0.63  CALCIUM 8.8* 8.8*    PT/INR No results for input(s): LABPROT,  INR in the last 72 hours. CMP     Component Value Date/Time   NA 132 (L) 12/15/2020 0306   NA 139 12/27/2016 1237   K 4.3 12/15/2020 0306   K 4.2 12/27/2016 1237   CL 102 12/15/2020 0306   CO2 25 12/15/2020 0306   CO2 25 12/27/2016 1237   GLUCOSE 108 (H) 12/15/2020 0306   GLUCOSE 112 12/27/2016 1237   BUN 13 12/15/2020 0306   BUN 15.2 12/27/2016 1237   CREATININE 0.63 12/15/2020 0306   CREATININE 1.1 12/27/2016 1237   CALCIUM 8.8 (L) 12/15/2020 0306   CALCIUM 9.7 12/27/2016 1237   PROT 5.7 (L) 12/14/2020 0326   PROT 7.6 12/27/2016 1237   ALBUMIN 2.2 (L) 12/14/2020 0326   ALBUMIN 4.1 12/27/2016 1237   AST 88 (H) 12/14/2020 0326   AST 20 12/27/2016 1237   ALT 128 (H) 12/14/2020 0326   ALT 18 12/27/2016 1237   ALKPHOS 149 (H) 12/14/2020 0326   ALKPHOS 80 12/27/2016 1237   BILITOT 0.2 (L) 12/14/2020 0326   BILITOT 0.45 12/27/2016 1237   GFRNONAA >60 12/15/2020 0306   GFRAA >60 11/20/2017 0948   Lipase     Component Value Date/Time   LIPASE 17 12/03/2020 2023       Studies/Results: No results found.  Anti-infectives: Anti-infectives (From admission, onward)    Start     Dose/Rate Route Frequency Ordered Stop   12/04/20 0700  piperacillin-tazobactam (ZOSYN) IVPB 3.375 g        3.375 g 12.5 mL/hr over 240  Minutes Intravenous Every 8 hours 12/04/20 0218     12/03/20 2315  piperacillin-tazobactam (ZOSYN) IVPB 3.375 g        3.375 g 100 mL/hr over 30 Minutes Intravenous  Once 12/03/20 2308 12/04/20 0002        Assessment/Plan  POD#11 - s/p ex lap with SBR for focal ischemic perforation - Dr. Zenia Resides 12/04/20 - IR drain in RLQ 10/6 - postop ileus - NGT out 10/9. Tolerating FLD well but low appetite. Advance to soft.  - mobilize, pulm toilet, IS - multi-modal pain control - IV robaxin , lidoderm patch. Oral pain meds added today - distal wound opened 10/10 and opened further today - bid dressing changes - WBC 12.9 (12.6) stable. Afebrile - drain culture with pan  sensitive e coli. May transition to PO augmentin today   FEN - soft, ensure, IVFs, PICC/TPN 10/5 >> VTE - lovenox ID - zosyn 9/29 >>   Hx of rectal adenocarcinoma Hx of DVT HTN ABL on chronic anemia     LOS: 11 days    Juan David, [facility] 12/15/2020, 11:21 AM Please see Amion for pager number during day hours 7:00am-4:30pm

## 2020-12-15 NOTE — Progress Notes (Signed)
Notified pharmacy re: TPN bag leakage. Floor RN made aware of it. Pharmacy to follow up.

## 2020-12-15 NOTE — Progress Notes (Addendum)
TPN leaking.  Changing to D10 at 80 mL/hr to cover for the next 23 hours until a new TPN can be ordered and made.  Pharmacy will order labs to check electrolytes in the AM and will follow-up as needed.  Marcello Fennel, RPh

## 2020-12-15 NOTE — Progress Notes (Signed)
PHARMACY - TOTAL PARENTERAL NUTRITION CONSULT NOTE  Indication: Prolonged ileus/small bowel perforation  Patient Measurements: Weight: 56.5 kg (124 lb 8 oz)   Body mass index is 17.61 kg/m.  Assessment:  21 YOM with h/o rectal adenocarcinoma admitted 9/29 with nausea, abdominal pain, cramps and fever worsened over the last few weeks. CT with pneumoperitoneum.  9/30 ex lap with SBR for focal ischemic perforation of unclear etiology with post-op ileus. SBR with primary reanastomosis. Noted to appear cachectic with BMI 17.6 and recent weight loss.  High risk for refeeding due to acute illness and severe PCM with NPO status and weight loss.   Glucose / Insulin: A1c 5.9%, noted pre-DM. CBGs controlled off insulin Electrolytes: Na 132, Mag 1.9 (goal >/= 2), CoCa high normal at 10.2, others WNL  Renal: Scr 0.6, BUN WNL Hepatic: LFTs mildly elevated, tbili WNL, albumin 2.2, TG 76 ID: Zosyn (9/30 >> ) for post-op abscess growing Ecoli and E faecalis, possible anaerobe  10/6 abscess cx - E.faecalis and pan sensitive E.coli, preliminary result Intake / Output; MIVF: NS @ 10 m/hr, UOP 0.9 ml/kg/hr,  drain 0 mL charted, LBM 10/10, net +9L GI Imaging: 9/29: mild ascites and punctate foci of extraluminal gas within RUQ and biliary hilum - enteric perforation. 10/5 CT: Two fluid collections in pelvis, question infected vs sterile collections. C/w postoperative ileus. GI Surgeries / Procedures:  9/30: s/p ex lap with SBR for focal ischemic perforation of unclear etiology 10/6: Pelvic fluid collection, drain placed 10/7 L hemipelvis 77ml fluid aspiration  Central access:  PICC 12/09/20 TPN start date: 12/09/20  Nutritional Goals:  RD Estimated Needs Total Energy Estimated Needs: 1900-2100 Total Protein Estimated Needs: 85-100 grams Total Fluid Estimated Needs: >/= 1.8 L  Current Nutrition:  TPN 10/8 CLD  10/9 FLD- only ate 1 bowl of cream of chicken soup, no appetite per patient 10/10 FLD -only  ate some beef broth due to no appetite  Plan:  Cycle TPN over 14 hours (rate 74-148 ml/hr; GIR 3.17-6.33 mg/kg/min) to stimulate appetite. Will provide 96g AA, 278g CHO and 57g ILE for a total of 1906 kCal, meeting 100% of patient needs  Electrolytes in TPN: Increase Mg 12 mEq/L; Change Cl:Acet 2:1; Continue Na 158mEq/L, K 30 mEq/L, Ca 2 mEq/L, Phos 15 mmol/L,  Add standard MVI and trace elements to TPN Standard TPN labs 10/11 and on Mon and Thurs F/u PO intake/diet and wean TPN as able   Benetta Spar, PharmD, BCPS, BCCP Clinical Pharmacist  Please check AMION for all [place] phone numbers After 10:00 PM, call [place]

## 2020-12-16 ENCOUNTER — Inpatient Hospital Stay (HOSPITAL_COMMUNITY): Payer: 59

## 2020-12-16 LAB — CBC
HCT: 31 % — ABNORMAL LOW (ref 39.0–52.0)
Hemoglobin: 10.2 g/dL — ABNORMAL LOW (ref 13.0–17.0)
MCH: 29 pg (ref 26.0–34.0)
MCHC: 32.9 g/dL (ref 30.0–36.0)
MCV: 88.1 fL (ref 80.0–100.0)
Platelets: 675 10*3/uL — ABNORMAL HIGH (ref 150–400)
RBC: 3.52 MIL/uL — ABNORMAL LOW (ref 4.22–5.81)
RDW: 14.3 % (ref 11.5–15.5)
WBC: 10.5 10*3/uL (ref 4.0–10.5)
nRBC: 0 % (ref 0.0–0.2)

## 2020-12-16 LAB — BASIC METABOLIC PANEL
Anion gap: 6 (ref 5–15)
BUN: 14 mg/dL (ref 8–23)
CO2: 25 mmol/L (ref 22–32)
Calcium: 9.3 mg/dL (ref 8.9–10.3)
Chloride: 101 mmol/L (ref 98–111)
Creatinine, Ser: 0.67 mg/dL (ref 0.61–1.24)
GFR, Estimated: >60 mL/min (ref >60)
Glucose, Bld: 99 mg/dL (ref 70–99)
Potassium: 4.1 mmol/L (ref 3.5–5.1)
Sodium: 132 mmol/L — ABNORMAL LOW (ref 135–145)

## 2020-12-16 LAB — PHOSPHORUS: Phosphorus: 3.5 mg/dL (ref 2.5–4.6)

## 2020-12-16 LAB — MAGNESIUM: Magnesium: 1.8 mg/dL (ref 1.7–2.4)

## 2020-12-16 MED ORDER — ALPRAZOLAM 0.25 MG PO TABS
0.2500 mg | ORAL_TABLET | Freq: Every evening | ORAL | Status: DC | PRN
  Administered 2020-12-16 – 2020-12-20 (×5): 0.25 mg via ORAL
  Filled 2020-12-16 (×5): qty 1

## 2020-12-16 MED ORDER — IOHEXOL 300 MG/ML  SOLN
100.0000 mL | Freq: Once | INTRAMUSCULAR | Status: AC
  Administered 2020-12-16: 100 mL via INTRAVENOUS

## 2020-12-16 MED ORDER — LIDOCAINE 5 % EX PTCH
2.0000 | MEDICATED_PATCH | CUTANEOUS | Status: DC
  Administered 2020-12-16 – 2020-12-18 (×4): 2 via TRANSDERMAL
  Administered 2020-12-19 – 2020-12-21 (×3): 1 via TRANSDERMAL
  Filled 2020-12-16 (×6): qty 2

## 2020-12-16 MED ORDER — MAGNESIUM SULFATE 2 GM/50ML IV SOLN
2.0000 g | Freq: Once | INTRAVENOUS | Status: AC
  Administered 2020-12-16: 2 g via INTRAVENOUS
  Filled 2020-12-16: qty 50

## 2020-12-16 MED ORDER — IOHEXOL 9 MG/ML PO SOLN
ORAL | Status: AC
  Administered 2020-12-16: 500 mL
  Filled 2020-12-16: qty 1000

## 2020-12-16 MED ORDER — TRAVASOL 10 % IV SOLN
INTRAVENOUS | Status: AC
  Filled 2020-12-16: qty 900

## 2020-12-16 NOTE — Progress Notes (Signed)
PHARMACY - TOTAL PARENTERAL NUTRITION CONSULT NOTE  Indication: Prolonged ileus/small bowel perforation  Patient Measurements: Weight: 56.5 kg (124 lb 8 oz)   Body mass index is 17.61 kg/m.  Assessment:  42 YOM with h/o rectal adenocarcinoma admitted 9/29 with nausea, abdominal pain, cramps and fever worsened over the last few weeks. CT with pneumoperitoneum.  9/30 ex lap with SBR for focal ischemic perforation of unclear etiology with post-op ileus. SBR with primary reanastomosis. Noted to appear cachectic with BMI 17.6 and recent weight loss.  High risk for refeeding due to acute illness and severe PCM with NPO status and weight loss.   Glucose / Insulin: A1c 5.9%, noted pre-DM. CBGs controlled off insulin Electrolytes: Na 132, Mag 1.8 (goal >/= 2), CoCa high at 10.7, others WNL  Renal: Scr 0.6, BUN WNL Hepatic: LFTs mildly elevated, tbili WNL, albumin 2.2, TG 76 ID: Zosyn/augmentin (9/30 >> ) for post-op abscess growing Ecoli and E faecalis, rare bacteroides   10/6 abscess cx - E.faecalis and pan sensitive E.coli, preliminary result Intake / Output; MIVF: D10 @80 /hr to replace leaking TPN, UOP 0.9 ml/kg/hr, LBM 10/10, net 10.9L GI Imaging: 9/29: mild ascites and punctate foci of extraluminal gas within RUQ and biliary hilum - enteric perforation. 10/5 CT: Two fluid collections in pelvis, question infected vs sterile collections. C/w postoperative ileus. 10/7 Abd [place]: trace ascites  10/12 CT abd: pending  GI Surgeries / Procedures:  9/30: s/p ex lap with SBR for focal ischemic perforation of unclear etiology 10/6: Pelvic fluid collection, drain placed 10/7 L hemipelvis 21ml fluid aspiration  Central access:  PICC 12/09/20 TPN start date: 12/09/20  Nutritional Goals:  RD Estimated Needs Total Energy Estimated Needs: 1900-2100 Total Protein Estimated Needs: 85-100 grams Total Fluid Estimated Needs: >/= 1.8 L  Current Nutrition:  TPN 10/8 CLD  10/9 FLD- ate 1 bowl of cream of  chicken soup, no appetite per patient 10/10 FLD -ate some beef broth due to no appetite 10/11: for lunch ate half of mashed potatoes and gravy, some roast beef; for dinner ate half of mashed and gravy and 1/4 of salmon. TPN was leaking so was not given, D10 given instead . 10/12: Not feeling well in AM after drinking contrast   Plan:  Discussed with team. Decrease Cycle TPN over 12 hours (rate 68-136 ml/hr; GIR 2.6-5.2 mg/kg/min). Will provide 90 g AA, 195 g CHO and 45 g ILE for a total of 1473 kCal, meeting ~75% of patient needs  Electrolytes in TPN: Remove Ca 0 mEq/L; Increase Mg 15 mEq/L; Continue Na 16mEq/L, K 30 mEq/L, Phos 15 mmol/L, JQ:BHAL 2:1 Add standard MVI and trace elements to TPN Standard TPN labs on Mon and Thurs Mag IV 2g x1  F/u PO intake/diet and wean TPN as able F/u CT scan result   Benetta Spar, PharmD, BCPS, BCCP Clinical Pharmacist  Please check AMION for all [place] phone numbers After 10:00 PM, call [place]

## 2020-12-16 NOTE — Progress Notes (Signed)
Nutrition Follow-up  DOCUMENTATION CODES:   Severe malnutrition in context of acute illness/injury, Underweight  INTERVENTION:   Continue Ensure Enlive po BID, each supplement provides 350 kcal and 20 grams of protein  Add Magic cup TID with meals, each supplement provides 290 kcal and 9 grams of protein  Recommend continuing TPN until pt demonstrating consistent ability to meet at least >50% of estimated needs via oral route   NUTRITION DIAGNOSIS:   Severe Malnutrition related to acute illness (pneumoperitoneum and ischemic bowel perforation) as evidenced by severe muscle depletion, severe fat depletion, percent weight loss (7.4% weight loss in less than 1 month).  On-going  GOAL:   Patient will meet greater than or equal to 90% of their needs  Progressing; diet advanced  MONITOR:   Diet advancement, Labs, Weight trends, I & O's  REASON FOR ASSESSMENT:   Consult New TPN/TNA  ASSESSMENT:   65 year old male who presented to the ED on 9/29 with nausea and abdominal pain x 1 month. PMH of rectal cancer s/p excision via transanal biopsy in 2018 and chemoradiation, HTN, anemia, DVT. Pt admitted with pneumoperitoneum and focal ischemic perforation of unclear etiology.  9/30 - s/p ex-lap, SBR, primary reanastomosis 10/9- NGT removed, diet advanced to full liquid diet 10/11- diet advanced to soft 10/12- CT with contrast today to evaluate abscess   Pt reports that he is beginning to feel better. His appetite is improving and suspects that all the fluids from TPN, contrast for CT and D10 have been contributing to his lack of appetite. Noted last night TPN was leaking and pt was given D10 to cover until TPN could be ordered and made for tonight.    Meal consumption: (10/10) 0% x 2 meals recorded  (10/11) 50% x 1 meal recorded   Noted plans from pharmacy to continue cyclic TPN but decrease rate to meet 75% of needs.  Per chart review wt appears to remain consistent with no  further wt loss. Will continue to monitor and adjust needs/supplements as necessary.  He reports having eaten almost all of his roast beef, and half of mashed potatoes and gravy for lunch yesterday and about 1/2 salmon and mashed potatoes and gravy last night. Pt did not have breakfast this morning d/t contrast and not feeling well. He is trying to introduce foods slowly since he has not had solid food x2 weeks. He does not tolerate ensure too well with episodes of diarrhea. States that he drank half before eating this morning. Encouraged him to eat first and suggested he tries drinking them between his meals rather than on an empty stomach. He is agreeable to adding magic cup to each meal to help increase nutritional intake.  He is currently getting up and moving more but reports episodes of diarrhea making it hard to walk far.  Medications: augmentin, hydrodiuril, protonix  Labs: Sodium 132  NUTRITION - FOCUSED PHYSICAL EXAM:  Flowsheet Row Most Recent Value  Orbital Region Severe depletion  Upper Arm Region Moderate depletion  Thoracic and Lumbar Region Moderate depletion  Buccal Region Severe depletion  Temple Region Severe depletion  Clavicle Bone Region Severe depletion  Clavicle and Acromion Bone Region Severe depletion  Scapular Bone Region Moderate depletion  Dorsal Hand Moderate depletion  Patellar Region Moderate depletion  Anterior Thigh Region Severe depletion  Posterior Calf Region Moderate depletion  Edema (RD Assessment) None  Hair Reviewed  Eyes Reviewed  Mouth Reviewed  Skin Reviewed  Nails Reviewed  Diet Order:   Diet Order             DIET SOFT Room service appropriate? Yes; Fluid consistency: Thin  Diet effective now                   EDUCATION NEEDS:   Education needs have been addressed  Skin:  Skin Assessment: Skin Integrity Issues: Skin Integrity Issues:: Incisions Incisions: abdomen  Last BM:  12/14/20 type 7, liquid  Height:    Ht Readings from Last 1 Encounters:  12/01/20 5' 10.5" (1.791 m)    Weight:   Wt Readings from Last 1 Encounters:  12/14/20 56.5 kg    BMI:  Body mass index is 17.61 kg/m.  Estimated Nutritional Needs:   Kcal:  1900-2100  Protein:  85-100 grams  Fluid:  >/= 1.8 L  Clayborne Dana, RDN, LDN Clinical Nutrition

## 2020-12-16 NOTE — Progress Notes (Signed)
Progress Note  12 Days Post-Op  Subjective: Tolerated soft diet well yesterday but did not have a great night due to some issues with TPN and some general feeling of malaise. Low appetite but improving  Still with localized nonradiating shoulder pain without chest pain or respiratory symptoms. Improved with lidocaine patch and heating pad  Objective: Vital signs in last 24 hours: Temp:  [98.5 F (36.9 C)-99.1 F (37.3 C)] 98.5 F (36.9 C) (10/12 0509) Pulse Rate:  [90-97] 92 (10/12 0509) Resp:  [17-18] 17 (10/11 2009) BP: (128-152)/(88-93) 140/93 (10/12 0509) SpO2:  [98 %-100 %] 100 % (10/12 0509) Last BM Date: 12/14/20  Intake/Output from previous day: 10/11 0701 - 10/12 0700 In: 2481.1 [P.O.:600; I.V.:1181.1; IV Piggyback:700] Out: 600 [Urine:600] Intake/Output this shift: No intake/output data recorded.  PE: General: pleasant, WD, thin male who is sitting up in chair in NAD HEENT: head is normocephalic, atraumatic. Mouth is pink and moist Heart: regular, rate, and rhythm.  Palpable radial pulses bilaterally Lungs: CTAB, no wheezes, rhonchi, or rales noted.  Respiratory effort nonlabored Abd: soft, ND, +BS, mild appropriate TTP around incision and along lower quadrants. No rebound or guarding. Staples intact in proximal wound. Most distal portion of wound opened - Purulent drainage at base of wound surrounded by granulation tissue and appropriate bleeding. Fascial stitch visible and intact. No surrounding erythema or induration Drain with serosanguinous output MSK: all 4 extremities are symmetrical with no cyanosis, clubbing, or edema. No calf TTP bilaterally Skin: warm and dry Psych: A&Ox3 with an appropriate affect.    Lab Results:  Recent Labs    12/15/20 0306 12/16/20 0425  WBC 12.9* 10.5  HGB 9.5* 10.2*  HCT 29.3* 31.0*  PLT 590* 675*    BMET Recent Labs    12/15/20 0306 12/16/20 0425  NA 132* 132*  K 4.3 4.1  CL 102 101  CO2 25 25  GLUCOSE 108*  99  BUN 13 14  CREATININE 0.63 0.67  CALCIUM 8.8* 9.3    PT/INR No results for input(s): LABPROT, INR in the last 72 hours. CMP     Component Value Date/Time   NA 132 (L) 12/16/2020 0425   NA 139 12/27/2016 1237   K 4.1 12/16/2020 0425   K 4.2 12/27/2016 1237   CL 101 12/16/2020 0425   CO2 25 12/16/2020 0425   CO2 25 12/27/2016 1237   GLUCOSE 99 12/16/2020 0425   GLUCOSE 112 12/27/2016 1237   BUN 14 12/16/2020 0425   BUN 15.2 12/27/2016 1237   CREATININE 0.67 12/16/2020 0425   CREATININE 1.1 12/27/2016 1237   CALCIUM 9.3 12/16/2020 0425   CALCIUM 9.7 12/27/2016 1237   PROT 5.7 (L) 12/14/2020 0326   PROT 7.6 12/27/2016 1237   ALBUMIN 2.2 (L) 12/14/2020 0326   ALBUMIN 4.1 12/27/2016 1237   AST 88 (H) 12/14/2020 0326   AST 20 12/27/2016 1237   ALT 128 (H) 12/14/2020 0326   ALT 18 12/27/2016 1237   ALKPHOS 149 (H) 12/14/2020 0326   ALKPHOS 80 12/27/2016 1237   BILITOT 0.2 (L) 12/14/2020 0326   BILITOT 0.45 12/27/2016 1237   GFRNONAA >60 12/16/2020 0425   GFRAA >60 11/20/2017 0948   Lipase     Component Value Date/Time   LIPASE 17 12/03/2020 2023       Studies/Results: No results found.  Anti-infectives: Anti-infectives (From admission, onward)    Start     Dose/Rate Route Frequency Ordered Stop   12/15/20 1445  amoxicillin-clavulanate (AUGMENTIN) 875-125  MG per tablet 1 tablet        1 tablet Oral Every 12 hours 12/15/20 1347     12/04/20 0700  piperacillin-tazobactam (ZOSYN) IVPB 3.375 g  Status:  Discontinued        3.375 g 12.5 mL/hr over 240 Minutes Intravenous Every 8 hours 12/04/20 0218 12/15/20 1347   12/03/20 2315  piperacillin-tazobactam (ZOSYN) IVPB 3.375 g        3.375 g 100 mL/hr over 30 Minutes Intravenous  Once 12/03/20 2308 12/04/20 0002        Assessment/Plan  POD#12 - s/p ex lap with SBR for focal ischemic perforation - Dr. Zenia Resides 12/04/20 - IR drain in RLQ 10/6 - postop ileus - NGT out 10/9. Tolerating soft diet, low appetite.  Advance to soft. Decrease TPN to 75% - mobilize, pulm toilet, IS - multi-modal pain control - IV robaxin , lidoderm patch. Oral pain meds added  - distal wound opened 10/10 and opened further today - bid dressing changes - WBC 10.5. Afebrile - drain culture with pan sensitive e coli. Transition to PO augmentin 10/11 - drain without output charted but consistently with fluid on exam  Will get CT with contrast today to evaluate abscess  Still with left shoulder pain    FEN - soft, ensure, IVFs, PICC/TPN 10/5 >> decrease TPN VTE - lovenox ID - zosyn 9/29 >10/11, augmentin 10/11>   Hx of rectal adenocarcinoma Hx of DVT HTN ABL on chronic anemia     LOS: 12 days    Winferd Humphrey, [facility] 12/16/2020, 7:41 AM Please see Amion for pager number during day hours 7:00am-4:30pm

## 2020-12-16 NOTE — Progress Notes (Signed)
    Referring Physician(s): Dr Jonny Ruiz  Supervising Physician: Corrie Mckusick  Patient Status:  [facility] - In-pt  Chief Complaint:  Right hemi-pelvis fluid collection drain.  Subjective:  Drain placed in IR 12/10/20 Nauseated today Op from drain is about same per pt---blood tinged 50 cc in bag Flushes easily every shift per pt  CT ordered today  Allergies: Patient has no known allergies.  Medications: Prior to Admission medications   Medication Sig Start Date End Date Taking? Authorizing Provider  dicyclomine (BENTYL) 20 MG tablet Take 1 tablet (20 mg total) by mouth 3 (three) times daily as needed for spasms (before meals). 12/01/20  Yes Gatha Mayer, MD  hydrochlorothiazide (HYDRODIURIL) 50 MG tablet Take 1/2 of a tablet by mouth every 24 hours for hypertension. Patient taking differently: Take 25 mg by mouth daily. 11/04/20  Yes   omeprazole (PRILOSEC) 40 MG capsule Take 40 mg by mouth daily.   Yes [provider]  timolol (TIMOPTIC) 0.5 % ophthalmic solution Place 1 drop into the left eye every morning.  11/19/14  Yes [provider]     Vital Signs: BP (!) 140/93 (BP Location: Left Arm)   Pulse 92   Temp 98.5 F (36.9 C) (Oral)   Resp 17   Wt 124 lb 8 oz (56.5 kg)   SpO2 100%   BMI 17.61 kg/m   Physical Exam Skin:    General: Skin is warm.     Comments: Site is clean and dry NT no bleeding OP blood tinged fluid 50 cc in bag now  FEW ESCHERICHIA COLI  FEW ENTEROCOCCUS FAECALIS  RARE BACTEROIDES OVATUS  RARE BACTEROIDES DISTASONIS  BETA LACTAMASE POSITIVE    Imaging: No results found.  Labs:  CBC: Recent Labs    12/09/20 0114 12/11/20 0350 12/15/20 0306 12/16/20 0425  WBC 14.7* 12.6* 12.9* 10.5  HGB 11.0* 10.5* 9.5* 10.2*  HCT 32.0* 32.1* 29.3* 31.0*  PLT 339 427* 590* 675*    COAGS: Recent Labs    12/10/20 0404  INR 1.1    BMP: Recent Labs    12/13/20 0322 12/14/20 0326 12/15/20 0306 12/16/20 0425  NA 132*  133* 132* 132*  K 4.3 4.6 4.3 4.1  CL 100 101 102 101  CO2 25 26 25 25   GLUCOSE 141* 124* 108* 99  BUN 12 11 13 14   CALCIUM 8.8* 8.8* 8.8* 9.3  CREATININE 0.60* 0.65 0.63 0.67  GFRNONAA >60 >60 >60 >60    LIVER FUNCTION TESTS: Recent Labs    12/03/20 2023 12/10/20 0404 12/12/20 0406 12/14/20 0326  BILITOT 0.3 0.7 0.4 0.2*  AST 15 32 75* 88*  ALT 13 33 74* 128*  ALKPHOS 78 161* 160* 149*  PROT 6.7 5.4* 5.3* 5.7*  ALBUMIN 3.8 2.0* 2.1* 2.2*    Assessment and Plan:  Rt pelvic drain intact Flushes easily OP blood tinged CCS has ordered CT today  Will follow  Electronically Signed: Lavonia Drafts, PA-C 12/16/2020, 9:41 AM   I spent a total of 15 Minutes at the the patient's bedside AND on the patient's hospital floor or unit, greater than 50% of which was counseling/coordinating care for right pelvic abscess drain

## 2020-12-17 ENCOUNTER — Inpatient Hospital Stay (HOSPITAL_COMMUNITY): Payer: 59

## 2020-12-17 LAB — COMPREHENSIVE METABOLIC PANEL
ALT: 123 U/L — ABNORMAL HIGH (ref 0–44)
AST: 67 U/L — ABNORMAL HIGH (ref 15–41)
Albumin: 2.4 g/dL — ABNORMAL LOW (ref 3.5–5.0)
Alkaline Phosphatase: 146 U/L — ABNORMAL HIGH (ref 38–126)
Anion gap: 7 (ref 5–15)
BUN: 16 mg/dL (ref 8–23)
CO2: 25 mmol/L (ref 22–32)
Calcium: 8.7 mg/dL — ABNORMAL LOW (ref 8.9–10.3)
Chloride: 101 mmol/L (ref 98–111)
Creatinine, Ser: 0.56 mg/dL — ABNORMAL LOW (ref 0.61–1.24)
GFR, Estimated: >60 mL/min (ref >60)
Glucose, Bld: 127 mg/dL — ABNORMAL HIGH (ref 70–99)
Potassium: 4.1 mmol/L (ref 3.5–5.1)
Sodium: 133 mmol/L — ABNORMAL LOW (ref 135–145)
Total Bilirubin: 0.3 mg/dL (ref 0.3–1.2)
Total Protein: 5.8 g/dL — ABNORMAL LOW (ref 6.5–8.1)

## 2020-12-17 LAB — MAGNESIUM: Magnesium: 2.4 mg/dL (ref 1.7–2.4)

## 2020-12-17 LAB — GLUCOSE, CAPILLARY: Glucose-Capillary: 99 mg/dL (ref 70–99)

## 2020-12-17 LAB — PHOSPHORUS: Phosphorus: 3.2 mg/dL (ref 2.5–4.6)

## 2020-12-17 MED ORDER — ACETAMINOPHEN 325 MG PO TABS
650.0000 mg | ORAL_TABLET | Freq: Four times a day (QID) | ORAL | Status: DC
  Administered 2020-12-18 – 2020-12-21 (×9): 650 mg via ORAL
  Filled 2020-12-17 (×13): qty 2

## 2020-12-17 MED ORDER — FAT EMUL FISH OIL/PLANT BASED 20% (SMOFLIPID)IV EMUL
INTRAVENOUS | Status: AC
  Filled 2020-12-17: qty 900

## 2020-12-17 MED ORDER — SIMETHICONE 80 MG PO CHEW
80.0000 mg | CHEWABLE_TABLET | Freq: Four times a day (QID) | ORAL | Status: DC | PRN
  Administered 2020-12-17 – 2020-12-18 (×2): 80 mg via ORAL
  Filled 2020-12-17 (×2): qty 1

## 2020-12-17 MED ORDER — PANTOPRAZOLE SODIUM 40 MG PO TBEC
40.0000 mg | DELAYED_RELEASE_TABLET | Freq: Every day | ORAL | Status: DC
  Administered 2020-12-17 – 2020-12-21 (×5): 40 mg via ORAL
  Filled 2020-12-17 (×6): qty 1

## 2020-12-17 NOTE — Progress Notes (Signed)
Progress Note  13 Days Post-Op  Subjective: Tolerating diet with low appetite. Good bowel function. Abdominal pain and shoulder pain stable  Wife is bedside  Objective: Vital signs in last 24 hours: Temp:  [98.2 F (36.8 C)-99.2 F (37.3 C)] 98.2 F (36.8 C) (10/13 0828) Pulse Rate:  [87-93] 93 (10/13 0828) Resp:  [18] 18 (10/13 0828) BP: (129-149)/(79-91) 135/91 (10/13 0828) SpO2:  [98 %-100 %] 98 % (10/13 0828) Weight:  [56.5 kg] 56.5 kg (10/13 0331) Last BM Date: 12/16/20  Intake/Output from previous day: 10/12 0701 - 10/13 0700 In: 1754.6 [P.O.:650; I.V.:1004.6; IV Piggyback:100] Out: 805 [Urine:775; Drains:30] Intake/Output this shift: No intake/output data recorded.  PE: General: pleasant, WD, thin male who is sitting up in chair in NAD HEENT: head is normocephalic, atraumatic. Mouth is pink and moist Heart: regular, rate, and rhythm.  Palpable radial pulses bilaterally Lungs: CTAB, no wheezes, rhonchi, or rales noted.  Respiratory effort nonlabored Abd: soft, ND, +BS, mild appropriate TTP around incision and along lower quadrants. No rebound or guarding. Staples intact in proximal wound. Most distal portion of wound opened - Purulent drainage at base of wound and some fibrinous tissue surrounded by granulation tissue and appropriate bleeding. Fascial stitch visible and intact. No surrounding erythema or induration Drain with serosanguinous output MSK: all 4 extremities are symmetrical with no cyanosis, clubbing, or edema. No calf TTP bilaterally Skin: warm and dry Psych: A&Ox3 with an appropriate affect.    Lab Results:  Recent Labs    12/15/20 0306 12/16/20 0425  WBC 12.9* 10.5  HGB 9.5* 10.2*  HCT 29.3* 31.0*  PLT 590* 675*    BMET Recent Labs    12/16/20 0425 12/17/20 0400  NA 132* 133*  K 4.1 4.1  CL 101 101  CO2 25 25  GLUCOSE 99 127*  BUN 14 16  CREATININE 0.67 0.56*  CALCIUM 9.3 8.7*    PT/INR No results for input(s): LABPROT, INR  in the last 72 hours. CMP     Component Value Date/Time   NA 133 (L) 12/17/2020 0400   NA 139 12/27/2016 1237   K 4.1 12/17/2020 0400   K 4.2 12/27/2016 1237   CL 101 12/17/2020 0400   CO2 25 12/17/2020 0400   CO2 25 12/27/2016 1237   GLUCOSE 127 (H) 12/17/2020 0400   GLUCOSE 112 12/27/2016 1237   BUN 16 12/17/2020 0400   BUN 15.2 12/27/2016 1237   CREATININE 0.56 (L) 12/17/2020 0400   CREATININE 1.1 12/27/2016 1237   CALCIUM 8.7 (L) 12/17/2020 0400   CALCIUM 9.7 12/27/2016 1237   PROT 5.8 (L) 12/17/2020 0400   PROT 7.6 12/27/2016 1237   ALBUMIN 2.4 (L) 12/17/2020 0400   ALBUMIN 4.1 12/27/2016 1237   AST 67 (H) 12/17/2020 0400   AST 20 12/27/2016 1237   ALT 123 (H) 12/17/2020 0400   ALT 18 12/27/2016 1237   ALKPHOS 146 (H) 12/17/2020 0400   ALKPHOS 80 12/27/2016 1237   BILITOT 0.3 12/17/2020 0400   BILITOT 0.45 12/27/2016 1237   GFRNONAA >60 12/17/2020 0400   GFRAA >60 11/20/2017 0948   Lipase     Component Value Date/Time   LIPASE 17 12/03/2020 2023       Studies/Results: CT ABDOMEN PELVIS W CONTRAST  Result Date: 12/16/2020 CLINICAL DATA:  History of small bowel perforation, status post laparotomy resection, complicated by abscess, status post percutaneous drain placement EXAM: CT ABDOMEN AND PELVIS WITH CONTRAST TECHNIQUE: Multidetector CT imaging of the abdomen and pelvis  was performed using the standard protocol following bolus administration of intravenous contrast. CONTRAST:  180mL OMNIPAQUE IOHEXOL 300 MG/ML SOLN, additional oral enteric contrast COMPARISON:  12/09/2020 FINDINGS: Lower chest: No acute abnormality. Hepatobiliary: No solid liver abnormality is seen. No gallstones, gallbladder wall thickening, or biliary dilatation. Pancreas: Unremarkable. No pancreatic ductal dilatation or surrounding inflammatory changes. Spleen: Normal in size without significant abnormality. Adrenals/Urinary Tract: Adrenal glands are unremarkable. Kidneys are normal, without  renal calculi, solid lesion, or hydronephrosis. Bladder is unremarkable. Stomach/Bowel: Stomach is within normal limits. Redemonstrated mid to distal small bowel resection and anastomosis in the posterior pelvis (series 3, image 5). Interval placement of a posterior approach percutaneous drain catheter in the right hemipelvis within a fluid collection, which is diminished in size, dominant component in the right pelvis measuring 4.1 x 1.6 cm, previously 8.2 x 3.7 cm (series 3, image 65). Additional fluid collection posterior to the cecum measuring 1.5 x 1.2 cm, likewise diminished in size, previously 4.2 x 1.6 cm (series 3, image 50). Diffusely thickened, inflamed appearing colon, particularly the cecum and sigmoid (series 3, image 52, 68). Sigmoid diverticulosis. Vascular/Lymphatic: Aortic atherosclerosis. No enlarged abdominal or pelvic lymph nodes. Reproductive: No mass or other significant abnormality. Other: Status post midline laparotomy. Subcutaneous emphysema about the right upper quadrant (series 3, image 36). Anasarca. No free-flowing ascites. Musculoskeletal: No acute or significant osseous findings. IMPRESSION: 1. Interval placement of a posterior approach percutaneous drain catheter in the right hemipelvis within a fluid collection, which is diminished in size, dominant component in the right pelvis measuring 4.1 x 1.6 cm, previously 8.2 x 3.7 cm. 2. Additional fluid collection posterior to the cecum measuring 1.5 x 1.2 cm, likewise diminished in size, previously 4.2 x 1.6 cm. 3. Redemonstrated mid to distal small bowel resection and anastomosis in the posterior pelvis. 4. Diffusely thickened, inflamed appearing colon, possibly reactive to adjacent fluid collections. 5. Status post midline laparotomy. Aortic Atherosclerosis (ICD10-I70.0). Electronically Signed   By: Delanna Ahmadi M.D.   On: 12/16/2020 14:07    Anti-infectives: Anti-infectives (From admission, onward)    Start     Dose/Rate Route  Frequency Ordered Stop   12/15/20 1445  amoxicillin-clavulanate (AUGMENTIN) 875-125 MG per tablet 1 tablet        1 tablet Oral Every 12 hours 12/15/20 1347     12/04/20 0700  piperacillin-tazobactam (ZOSYN) IVPB 3.375 g  Status:  Discontinued        3.375 g 12.5 mL/hr over 240 Minutes Intravenous Every 8 hours 12/04/20 0218 12/15/20 1347   12/03/20 2315  piperacillin-tazobactam (ZOSYN) IVPB 3.375 g        3.375 g 100 mL/hr over 30 Minutes Intravenous  Once 12/03/20 2308 12/04/20 0002        Assessment/Plan  POD#13 - s/p ex lap with SBR for focal ischemic perforation - Dr. Zenia Resides 12/04/20 - IR drain in RLQ 10/6 - IR following - postop ileus - NGT out 10/9. Tolerating soft diet, low appetite. Advance to soft. Decrease TPN to 75% 10/12 and continue today.  - mobilize, pulm toilet, IS - multi-modal pain control - lidoderm patch. Oral pain meds added. Oral robaxin - distal wound opened 10/10 - bid dressing changes. Changed by me this am - WBC 10.5 10/12. Afebrile - drain culture with pan sensitive e coli. Transitioned to PO augmentin 10/11 - CT 10/12 with dec fluid collection in R pelvis and dec fluid collection posterior to cecum - drain output 30 ml - continue drain  Still with  left shoulder pain - no radiation. Xray today   FEN - soft, ensure, IVFs, PICC/TPN 10/5 >> TPN 75% VTE - lovenox ID - zosyn 9/29 >10/11, augmentin 10/11>   Hx of rectal adenocarcinoma Hx of DVT HTN ABL on chronic anemia     LOS: 13 days    Winferd Humphrey, [facility] 12/17/2020, 8:33 AM Please see Amion for pager number during day hours 7:00am-4:30pm

## 2020-12-17 NOTE — Progress Notes (Signed)
PHARMACY - TOTAL PARENTERAL NUTRITION CONSULT NOTE  Indication: Prolonged ileus/small bowel perforation  Patient Measurements: Weight: 56.5 kg (124 lb 9 oz)   Body mass index is 17.62 kg/m.  Assessment:  79 YOM with h/o rectal adenocarcinoma admitted 9/29 with nausea, abdominal pain, cramps and fever worsened over the last few weeks. CT with pneumoperitoneum.  9/30 ex lap with SBR for focal ischemic perforation of unclear etiology with post-op ileus. SBR with primary reanastomosis. Noted to appear cachectic with BMI 17.6 and recent weight loss.  High risk for refeeding due to acute illness and severe PCM with NPO status and weight loss.   Glucose / Insulin: A1c 5.9%, noted pre-DM. CBG <180 off insulin Electrolytes: Na 133 (on max Na), CoCa 9.9, others WNL  Renal: Scr 0.56, BUN WNL Hepatic: LFTs mildly elevated & stable, tbili WNL, albumin 2.4, TG 76 ID: Zosyn/augmentin (9/30 >> ) for post-op abscess growing Ecoli and E faecalis, rare bacteroides   10/6 abscess cx - E.faecalis and pan sensitive E.coli, preliminary result Intake / Output; MIVF: UOP 1.1 ml/kg/hr, LBM 10/10, drains 27ml; net 10.9L GI Imaging: 9/29: mild ascites and punctate foci of extraluminal gas within RUQ and biliary hilum - enteric perforation. 10/5 CT: Two fluid collections in pelvis, question infected vs sterile collections. C/w postoperative ileus. 10/7 Abd [place]: trace ascites  10/12 CT abd: fluid collections at R pelvis and cecum diminished in size  GI Surgeries / Procedures:  9/30: s/p ex lap with SBR for focal ischemic perforation of unclear etiology 10/6: Pelvic fluid collection, drain placed 10/7 L hemipelvis 50ml fluid aspiration  Central access:  PICC 12/09/20 TPN start date: 12/09/20  Nutritional Goals:  RD Estimated Needs Total Energy Estimated Needs: 1900-2100 Total Protein Estimated Needs: 85-100 grams Total Fluid Estimated Needs: >/= 1.8 L  Current Nutrition:  TPN 10/8 CLD  10/9 FLD- ate 1 bowl  of cream of chicken soup, no appetite per patient 10/10 FLD -ate some beef broth due to no appetite 10/11: for lunch ate half of mashed potatoes and gravy, some roast beef; for dinner ate half of mashed and gravy and 1/4 of salmon. TPN was leaking so was not given, D10 given instead . 10/12: Not feeling well in AM after drinking contrast; Lunch: ate meat sauce (didn't eat spaghetti because was too hard), chicken noodle soup; Dinner: [employer], 1/3 of tilapia, a few fries, 1/4 of an Ensure 10/13: breakfast: 2 hard boiled eggs, 1 ensure, working on [employer] during interview. Patient feels comfortable with TPN received last night, appetite is returning. Per RD, recommend continuing TPN until pt demonstrating consistent ability to meet at least >50% of estimated needs via oral route    Plan:  Continue Cyclic TPN over 12 hours (rate 68-136 ml/hr; GIR 2.6-5.2 mg/kg/min). Will provide 90 g AA, 195 g CHO and 45 g ILE for a total of 1473 kCal, meeting ~75% of patient needs  Electrolytes in TPN: Decrease Mg 10 mEq/L; Continue Na 13mEq/L, K 30 mEq/L, Ca 0 mEq/L, Phos 15 mmol/L, CH:ENID 2:1 Add standard MVI and trace elements to TPN Standard TPN labs on Mon and Thurs F/u PO intake/diet and wean TPN as able   Benetta Spar, PharmD, BCPS, BCCP Clinical Pharmacist  Please check AMION for all [place] phone numbers After 10:00 PM, call [place]

## 2020-12-17 NOTE — Progress Notes (Signed)
    Drain Location: left gluteal drain  Size: Fr size: 10 Fr Date of placement: 12/10/20  Currently to: Drain collection device: gravity 24 hour output:  Output by Drain (mL) 12/15/20 0700 - 12/15/20 1459 12/15/20 1500 - 12/15/20 2259 12/15/20 2300 - 12/16/20 0659 12/16/20 0700 - 12/16/20 1459 12/16/20 1500 - 12/16/20 2259 12/16/20 2300 - 12/17/20 0659 12/17/20 0700 - 12/17/20 1138  Closed System Drain 1 Right Buttock Other (Comment) 10 Fr.     30      Interval imaging/drain manipulation:   CT 12/16/20  IMPRESSION: 1. Interval placement of a posterior approach percutaneous drain catheter in the right hemipelvis within a fluid collection, which is diminished in size, dominant component in the right pelvis measuring 4.1 x 1.6 cm, previously 8.2 x 3.7 cm. 2. Additional fluid collection posterior to the cecum measuring 1.5 x 1.2 cm, likewise diminished in size, previously 4.2 x 1.6 cm. 3. Redemonstrated mid to distal small bowel resection and anastomosis in the posterior pelvis. 4. Diffusely thickened, inflamed appearing colon, possibly reactive to adjacent fluid collections. 5. Status post midline laparotomy.  Current examination: Flushes/aspirates easily.  Insertion site unremarkable. Suture and stat lock in place. Dressed appropriately.   Plan: Continue TID flushes with 5 cc NS. Record output Q shift. Dressing changes QD or PRN if soiled.  Call IR APP or on call IR MD if difficulty flushing or sudden change in drain output.  Repeat imaging/possible drain injection once output < 10 mL/QD (excluding flush material.)  Discharge planning: Please contact IR APP or on call IR MD prior to patient d/c to ensure appropriate follow up plans are in place. Typically patient will follow up with IR clinic 10-14 days post d/c for repeat imaging/possible drain injection. IR scheduler will contact patient with date/time of appointment. Patient will need to flush drain QD with 5 cc NS, record  output QD, dressing changes every 2-3 days or earlier if soiled.   IR will continue to follow - please call with questions or concerns.  Electronically SignedPasty Spillers 12/17/2020, 11:38 AM    I spent a total of 15 Minutes at the the patient's bedside AND on the patient's hospital floor or unit, greater than 50% of which was counseling/coordinating care for right pelvic abscess drain

## 2020-12-18 LAB — CREATININE, SERUM
Creatinine, Ser: 0.59 mg/dL — ABNORMAL LOW (ref 0.61–1.24)
GFR, Estimated: >60 mL/min (ref >60)

## 2020-12-18 MED ORDER — MORPHINE SULFATE (PF) 4 MG/ML IV SOLN
4.0000 mg | INTRAVENOUS | Status: DC | PRN
Start: 2020-12-18 — End: 2020-12-22

## 2020-12-18 MED ORDER — DIPHENHYDRAMINE HCL 12.5 MG/5ML PO ELIX: 12.5000 mg | ORAL_SOLUTION | Freq: Four times a day (QID) | ORAL | Status: DC | PRN

## 2020-12-18 MED ORDER — METHOCARBAMOL 750 MG PO TABS
750.0000 mg | ORAL_TABLET | Freq: Four times a day (QID) | ORAL | Status: DC
  Administered 2020-12-18 – 2020-12-21 (×13): 750 mg via ORAL
  Filled 2020-12-18 (×13): qty 1

## 2020-12-18 MED ORDER — FAT EMUL FISH OIL/PLANT BASED 20% (SMOFLIPID)IV EMUL
INTRAVENOUS | Status: DC
  Filled 2020-12-18: qty 500

## 2020-12-18 NOTE — Plan of Care (Signed)

## 2020-12-18 NOTE — Progress Notes (Signed)
Progress Note  14 Days Post-Op  Subjective: Tolerating soft diet. BM yesterday. Some gas pain today. Shoulder pain improving. No nausea or emesis  Wife is bedside  Objective: Vital signs in last 24 hours: Temp:  [98 F (36.7 C)-99.1 F (37.3 C)] 99.1 F (37.3 C) (10/14 0342) Pulse Rate:  [90-99] 99 (10/14 0342) Resp:  [16-18] 16 (10/13 1500) BP: (135-150)/(88-92) 139/88 (10/14 0342) SpO2:  [97 %-99 %] 99 % (10/14 0342) Weight:  [58.5 kg] 58.5 kg (10/14 0432) Last BM Date: 12/16/20  Intake/Output from previous day: 10/13 0701 - 10/14 0700 In: 2053.9 [P.O.:240; I.V.:1413.9; IV Piggyback:400] Out: 5573 [Urine:1800; Drains:25] Intake/Output this shift: No intake/output data recorded.  PE: General: pleasant, WD, thin male who is sitting up in chair in NAD HEENT: head is normocephalic, atraumatic. Mouth is pink and moist Heart: regular, rate, and rhythm.  Palpable radial pulses bilaterally Lungs: CTAB, no wheezes, rhonchi, or rales noted.  Respiratory effort nonlabored Abd: soft, ND, +BS, mild appropriate TTP around incision and along lower quadrants. No rebound or guarding. Staples intact in proximal wound. Most distal portion of wound opened -  fibrinous tissue at base of wound surrounded by granulation tissue and appropriate bleeding. Fascial stitch visible and intact. No surrounding erythema or induration Drain with serosanguinous output MSK: all 4 extremities are symmetrical with no cyanosis, clubbing, or edema. No calf TTP bilaterally. Point TTP over left lateral thoracic back immediately below scapula without heat, deformity, edema. ROM of left shoulder intact. Skin: warm and dry Psych: A&Ox3 with an appropriate affect.    Lab Results:  Recent Labs    12/16/20 0425  WBC 10.5  HGB 10.2*  HCT 31.0*  PLT 675*    BMET Recent Labs    12/16/20 0425 12/17/20 0400 12/18/20 0411  NA 132* 133*  --   K 4.1 4.1  --   CL 101 101  --   CO2 25 25  --   GLUCOSE 99  127*  --   BUN 14 16  --   CREATININE 0.67 0.56* 0.59*  CALCIUM 9.3 8.7*  --     PT/INR No results for input(s): LABPROT, INR in the last 72 hours. CMP     Component Value Date/Time   NA 133 (L) 12/17/2020 0400   NA 139 12/27/2016 1237   K 4.1 12/17/2020 0400   K 4.2 12/27/2016 1237   CL 101 12/17/2020 0400   CO2 25 12/17/2020 0400   CO2 25 12/27/2016 1237   GLUCOSE 127 (H) 12/17/2020 0400   GLUCOSE 112 12/27/2016 1237   BUN 16 12/17/2020 0400   BUN 15.2 12/27/2016 1237   CREATININE 0.59 (L) 12/18/2020 0411   CREATININE 1.1 12/27/2016 1237   CALCIUM 8.7 (L) 12/17/2020 0400   CALCIUM 9.7 12/27/2016 1237   PROT 5.8 (L) 12/17/2020 0400   PROT 7.6 12/27/2016 1237   ALBUMIN 2.4 (L) 12/17/2020 0400   ALBUMIN 4.1 12/27/2016 1237   AST 67 (H) 12/17/2020 0400   AST 20 12/27/2016 1237   ALT 123 (H) 12/17/2020 0400   ALT 18 12/27/2016 1237   ALKPHOS 146 (H) 12/17/2020 0400   ALKPHOS 80 12/27/2016 1237   BILITOT 0.3 12/17/2020 0400   BILITOT 0.45 12/27/2016 1237   GFRNONAA >60 12/18/2020 0411   GFRAA >60 11/20/2017 0948   Lipase     Component Value Date/Time   LIPASE 17 12/03/2020 2023       Studies/Results: CT ABDOMEN PELVIS W CONTRAST  Result Date: 12/16/2020  CLINICAL DATA:  History of small bowel perforation, status post laparotomy resection, complicated by abscess, status post percutaneous drain placement EXAM: CT ABDOMEN AND PELVIS WITH CONTRAST TECHNIQUE: Multidetector CT imaging of the abdomen and pelvis was performed using the standard protocol following bolus administration of intravenous contrast. CONTRAST:  161mL OMNIPAQUE IOHEXOL 300 MG/ML SOLN, additional oral enteric contrast COMPARISON:  12/09/2020 FINDINGS: Lower chest: No acute abnormality. Hepatobiliary: No solid liver abnormality is seen. No gallstones, gallbladder wall thickening, or biliary dilatation. Pancreas: Unremarkable. No pancreatic ductal dilatation or surrounding inflammatory changes. Spleen:  Normal in size without significant abnormality. Adrenals/Urinary Tract: Adrenal glands are unremarkable. Kidneys are normal, without renal calculi, solid lesion, or hydronephrosis. Bladder is unremarkable. Stomach/Bowel: Stomach is within normal limits. Redemonstrated mid to distal small bowel resection and anastomosis in the posterior pelvis (series 3, image 20). Interval placement of a posterior approach percutaneous drain catheter in the right hemipelvis within a fluid collection, which is diminished in size, dominant component in the right pelvis measuring 4.1 x 1.6 cm, previously 8.2 x 3.7 cm (series 3, image 65). Additional fluid collection posterior to the cecum measuring 1.5 x 1.2 cm, likewise diminished in size, previously 4.2 x 1.6 cm (series 3, image 50). Diffusely thickened, inflamed appearing colon, particularly the cecum and sigmoid (series 3, image 52, 68). Sigmoid diverticulosis. Vascular/Lymphatic: Aortic atherosclerosis. No enlarged abdominal or pelvic lymph nodes. Reproductive: No mass or other significant abnormality. Other: Status post midline laparotomy. Subcutaneous emphysema about the right upper quadrant (series 3, image 36). Anasarca. No free-flowing ascites. Musculoskeletal: No acute or significant osseous findings. IMPRESSION: 1. Interval placement of a posterior approach percutaneous drain catheter in the right hemipelvis within a fluid collection, which is diminished in size, dominant component in the right pelvis measuring 4.1 x 1.6 cm, previously 8.2 x 3.7 cm. 2. Additional fluid collection posterior to the cecum measuring 1.5 x 1.2 cm, likewise diminished in size, previously 4.2 x 1.6 cm. 3. Redemonstrated mid to distal small bowel resection and anastomosis in the posterior pelvis. 4. Diffusely thickened, inflamed appearing colon, possibly reactive to adjacent fluid collections. 5. Status post midline laparotomy. Aortic Atherosclerosis (ICD10-I70.0). Electronically Signed   By: Delanna Ahmadi M.D.   On: 12/16/2020 14:07   DG Shoulder Left Port  Result Date: 12/17/2020 CLINICAL DATA:  Left shoulder pain without known injury. EXAM: LEFT SHOULDER COMPARISON:  None. FINDINGS: There is no evidence of fracture or dislocation. There is no evidence of arthropathy or other focal bone abnormality. Soft tissues are unremarkable. IMPRESSION: Negative. Electronically Signed   By: Marijo Conception M.D.   On: 12/17/2020 11:48    Anti-infectives: Anti-infectives (From admission, onward)    Start     Dose/Rate Route Frequency Ordered Stop   12/15/20 1445  amoxicillin-clavulanate (AUGMENTIN) 875-125 MG per tablet 1 tablet        1 tablet Oral Every 12 hours 12/15/20 1347     12/04/20 0700  piperacillin-tazobactam (ZOSYN) IVPB 3.375 g  Status:  Discontinued        3.375 g 12.5 mL/hr over 240 Minutes Intravenous Every 8 hours 12/04/20 0218 12/15/20 1347   12/03/20 2315  piperacillin-tazobactam (ZOSYN) IVPB 3.375 g        3.375 g 100 mL/hr over 30 Minutes Intravenous  Once 12/03/20 2308 12/04/20 0002        Assessment/Plan  POD#14 - s/p ex lap with SBR for focal ischemic perforation - Dr. Zenia Resides 12/04/20 - IR drain in RLQ 10/6 - IR following.  Will need to flush drain QD and f/u with IR 10-14 days after d/c - CT 10/12 with dec fluid collection in R pelvis and dec fluid collection posterior to cecum - postop ileus - NGT out 10/9. Tolerating soft diet, low appetite. Decrease TPN to 50% today.  - mobilize, pulm toilet, IS - multi-modal pain control - lidoderm patch. Oral pain meds added. Oral robaxin. Discussed with patient plan to transition to oral pain meds today. - distal wound opened 10/10 - bid dressing changes. Changed by me this am - drain culture with pan sensitive e coli. Transitioned to PO augmentin 10/11  Still with left shoulder pain - improving. Xray 10/13 negative. Pain medications and lidocaine patch   FEN - soft, ensure, IVFs, PICC/TPN 10/5 >> TPN 50% VTE - lovenox ID  - zosyn 9/29 >10/11, augmentin 10/11>   Hx of rectal adenocarcinoma Hx of DVT HTN ABL on chronic anemia     LOS: 14 days    Winferd Humphrey, [facility] 12/18/2020, 7:41 AM Please see Amion for pager number during day hours 7:00am-4:30pm

## 2020-12-18 NOTE — Progress Notes (Signed)
Referring Physician(s): * No referring provider recorded for this case *  Supervising Physician: Sandi Mariscal  Patient Status:  [facility] - In-pt  Chief Complaint:  Pelvic abscess s/p drain placement  Subjective: Reports increased abdominal pain.  He is concerned for constipation.  Last BM yesterday, which was loose.  He is getting up regularly to walk about.  No N/V.    Allergies: Patient has no known allergies.  Medications: Prior to Admission medications   Medication Sig Start Date End Date Taking? Authorizing Provider  dicyclomine (BENTYL) 20 MG tablet Take 1 tablet (20 mg total) by mouth 3 (three) times daily as needed for spasms (before meals). 12/01/20  Yes Gatha Mayer, MD  hydrochlorothiazide (HYDRODIURIL) 50 MG tablet Take 1/2 of a tablet by mouth every 24 hours for hypertension. Patient taking differently: Take 25 mg by mouth daily. 11/04/20  Yes   omeprazole (PRILOSEC) 40 MG capsule Take 40 mg by mouth daily.   Yes [provider]  timolol (TIMOPTIC) 0.5 % ophthalmic solution Place 1 drop into the left eye every morning.  11/19/14  Yes [provider]     Vital Signs: BP (!) 141/89 (BP Location: Left Arm)   Pulse 94   Temp 98.3 F (36.8 C) (Oral)   Resp 18   Wt 128 lb 15.5 oz (58.5 kg)   SpO2 95%   BMI 18.24 kg/m   Physical Exam:  General: pleasant, laying at an incline in bed, articulate HEENT: normocephalic, atraumatic, mouth is pink and clear Heart: RRR, equally strong radial pulses Lungs: CTAB, respirations are non-labored Abd: soft, non distended, +BS, TTP along incision and in both lower quadrants.  R Gluteal drain with minimal blood-tinged output, dressing c/d/i Skin: warm and dry Psych: appropriate behavior, judgment, no focal deficits  Imaging: CT ABDOMEN PELVIS W CONTRAST  Result Date: 12/16/2020 CLINICAL DATA:  History of small bowel perforation, status post laparotomy resection, complicated by abscess, status post  percutaneous drain placement EXAM: CT ABDOMEN AND PELVIS WITH CONTRAST TECHNIQUE: Multidetector CT imaging of the abdomen and pelvis was performed using the standard protocol following bolus administration of intravenous contrast. CONTRAST:  140mL OMNIPAQUE IOHEXOL 300 MG/ML SOLN, additional oral enteric contrast COMPARISON:  12/09/2020 FINDINGS: Lower chest: No acute abnormality. Hepatobiliary: No solid liver abnormality is seen. No gallstones, gallbladder wall thickening, or biliary dilatation. Pancreas: Unremarkable. No pancreatic ductal dilatation or surrounding inflammatory changes. Spleen: Normal in size without significant abnormality. Adrenals/Urinary Tract: Adrenal glands are unremarkable. Kidneys are normal, without renal calculi, solid lesion, or hydronephrosis. Bladder is unremarkable. Stomach/Bowel: Stomach is within normal limits. Redemonstrated mid to distal small bowel resection and anastomosis in the posterior pelvis (series 3, image 72). Interval placement of a posterior approach percutaneous drain catheter in the right hemipelvis within a fluid collection, which is diminished in size, dominant component in the right pelvis measuring 4.1 x 1.6 cm, previously 8.2 x 3.7 cm (series 3, image 65). Additional fluid collection posterior to the cecum measuring 1.5 x 1.2 cm, likewise diminished in size, previously 4.2 x 1.6 cm (series 3, image 50). Diffusely thickened, inflamed appearing colon, particularly the cecum and sigmoid (series 3, image 52, 68). Sigmoid diverticulosis. Vascular/Lymphatic: Aortic atherosclerosis. No enlarged abdominal or pelvic lymph nodes. Reproductive: No mass or other significant abnormality. Other: Status post midline laparotomy. Subcutaneous emphysema about the right upper quadrant (series 3, image 36). Anasarca. No free-flowing ascites. Musculoskeletal: No acute or significant osseous findings. IMPRESSION: 1. Interval placement of a posterior approach percutaneous  drain  catheter in the right hemipelvis within a fluid collection, which is diminished in size, dominant component in the right pelvis measuring 4.1 x 1.6 cm, previously 8.2 x 3.7 cm. 2. Additional fluid collection posterior to the cecum measuring 1.5 x 1.2 cm, likewise diminished in size, previously 4.2 x 1.6 cm. 3. Redemonstrated mid to distal small bowel resection and anastomosis in the posterior pelvis. 4. Diffusely thickened, inflamed appearing colon, possibly reactive to adjacent fluid collections. 5. Status post midline laparotomy. Aortic Atherosclerosis (ICD10-I70.0). Electronically Signed   By: Delanna Ahmadi M.D.   On: 12/16/2020 14:07   DG Shoulder Left Port  Result Date: 12/17/2020 CLINICAL DATA:  Left shoulder pain without known injury. EXAM: LEFT SHOULDER COMPARISON:  None. FINDINGS: There is no evidence of fracture or dislocation. There is no evidence of arthropathy or other focal bone abnormality. Soft tissues are unremarkable. IMPRESSION: Negative. Electronically Signed   By: Marijo Conception M.D.   On: 12/17/2020 11:48    Labs:  CBC: Recent Labs    12/09/20 0114 12/11/20 0350 12/15/20 0306 12/16/20 0425  WBC 14.7* 12.6* 12.9* 10.5  HGB 11.0* 10.5* 9.5* 10.2*  HCT 32.0* 32.1* 29.3* 31.0*  PLT 339 427* 590* 675*    COAGS: Recent Labs    12/10/20 0404  INR 1.1    BMP: Recent Labs    12/14/20 0326 12/15/20 0306 12/16/20 0425 12/17/20 0400 12/18/20 0411  NA 133* 132* 132* 133*  --   K 4.6 4.3 4.1 4.1  --   CL 101 102 101 101  --   CO2 26 25 25 25   --   GLUCOSE 124* 108* 99 127*  --   BUN 11 13 14 16   --   CALCIUM 8.8* 8.8* 9.3 8.7*  --   CREATININE 0.65 0.63 0.67 0.56* 0.59*  GFRNONAA >60 >60 >60 >60 >60    LIVER FUNCTION TESTS: Recent Labs    12/10/20 0404 12/12/20 0406 12/14/20 0326 12/17/20 0400  BILITOT 0.7 0.4 0.2* 0.3  AST 32 75* 88* 67*  ALT 33 74* 128* 123*  ALKPHOS 161* 160* 149* 146*  PROT 5.4* 5.3* 5.7* 5.8*  ALBUMIN 2.0* 2.1* 2.2* 2.4*     Assessment and Plan:  Pelvic abscess s/p R gluteal drain --WBC 10.5 --Has begun to eat and has minimal appetite --Has some persistent lower abdominal pain and tenderness, surgery is managing --drain flushing well --25cc OP last 24 --will continue to follow; notify when drain op is less than 10cc in 24 so to arrange for additional imaging or drain injection.   Electronically Signed: Pasty Spillers, PA 12/18/2020, 10:01 AM   I spent a total of 15 Minutes at the the patient's bedside AND on the patient's hospital floor or unit, greater than 50% of which was counseling/coordinating care for pelvic abscess

## 2020-12-18 NOTE — Progress Notes (Signed)
PHARMACY - TOTAL PARENTERAL NUTRITION CONSULT NOTE  Indication: Prolonged ileus/small bowel perforation  Patient Measurements: Weight: 58.5 kg (128 lb 15.5 oz)   Body mass index is 18.24 kg/m.  Assessment:  86 YOM with h/o rectal adenocarcinoma admitted 9/29 with nausea, abdominal pain, cramps and fever worsened over the last few weeks. CT with pneumoperitoneum.  9/30 ex lap with SBR for focal ischemic perforation of unclear etiology with post-op ileus. SBR with primary reanastomosis. Noted to appear cachectic with BMI 17.6 and recent weight loss.  High risk for refeeding due to acute illness and severe PCM with NPO status and weight loss.   Glucose / Insulin: A1c 5.9%, noted pre-DM. CBGs controlled, off insulin Electrolytes: Na 133 (on max Na in TPN), others WNL  Renal: Scr WNL stable, BUN WNL Hepatic: LFTs mildly elevated & stable, Tbili / TG WNL, albumin 2.4 Intake / Output; MIVF: UOP 1.3 ml/kg/hr, LBM 10/12, drain output 37ml; net +12L GI Imaging: 9/29: mild ascites and punctate foci of extraluminal gas within RUQ and biliary hilum - enteric perforation. 10/5 CT: Two fluid collections in pelvis, question infected vs sterile collections. C/w postoperative ileus. 10/7 Abd [place]: trace ascites  10/12 CT abd: fluid collections at R pelvis and cecum diminished in size GI Surgeries / Procedures: 9/30: s/p ex lap with SBR for focal ischemic perforation of unclear etiology 10/6: Pelvic fluid collection, drain placed 10/7 L hemipelvis 48ml fluid aspiration  Central access:  PICC 12/09/20 TPN start date: 12/09/20  Nutritional Goals:  RD Estimated Needs Total Energy Estimated Needs: 1900-2100 Total Protein Estimated Needs: 85-100 grams Total Fluid Estimated Needs: >/= 1.8 L  Current Nutrition:  TPN Ensure BID - 1 dose charted yesterday Advanced to soft diet 10/11 - appetite returning 10/13. Per RD, recommend continuing TPN until pt demonstrating consistent ability to meet at least >50% of  estimated needs via oral route    Plan:  Wean 47-WL cyclic TPN to 1/2 goal rate at 1800 per discussion with Surgery with plan to d/c TPN tomorrow Cycle ml over 12hrs - 45 ml/hr x 1hr; then 91 ml/hr x 10hrs, then 45 ml/hr x 1hr Electrolytes in TPN: Na 137mEq/L, K 30 mEq/L, Ca 0 mEq/L, Mg 10 mEq/L, and Phos 15 mmol/L, Cl:Ac 2:1 Add standard MVI and trace elements to TPN Standard TPN labs on Mon and Thurs F/u PO intake and ability to wean TPN off 10/14   Arturo Morton, PharmD, BCPS Please check AMION for all [place] contact numbers Clinical Pharmacist 12/18/2020 8:02 AM

## 2020-12-19 NOTE — Progress Notes (Signed)
PHARMACY - TOTAL PARENTERAL NUTRITION CONSULT NOTE  Indication: Prolonged ileus/small bowel perforation  Patient Measurements: Weight: 58.5 kg (128 lb 15.5 oz)   Body mass index is 18.24 kg/m.  Assessment:  69 YOM with h/o rectal adenocarcinoma admitted 9/29 with nausea, abdominal pain, cramps and fever worsened over the last few weeks. CT with pneumoperitoneum.  9/30 ex lap with SBR for focal ischemic perforation of unclear etiology with post-op ileus. SBR with primary reanastomosis. Noted to appear cachectic with BMI 17.6 and recent weight loss.  High risk for refeeding due to acute illness and severe PCM with NPO status and weight loss.   Glucose / Insulin: A1c 5.9%, noted pre-DM. CBGs controlled, off insulin Electrolytes: Na 133 (on max Na in TPN), others WNL  Renal: Scr WNL stable, BUN WNL Hepatic: LFTs mildly elevated & stable, Tbili / TG WNL, albumin 2.4 Intake / Output; MIVF: UOP 0.8 ml/kg/hr, LBM 10/12, drain output 81ml; net +9.2L GI Imaging: 9/29: mild ascites and punctate foci of extraluminal gas within RUQ and biliary hilum - enteric perforation. 10/5 CT: Two fluid collections in pelvis, question infected vs sterile collections. C/w postoperative ileus. 10/7 Abd [place]: trace ascites  10/12 CT abd: fluid collections at R pelvis and cecum diminished in size GI Surgeries / Procedures: 9/30: s/p ex lap with SBR for focal ischemic perforation of unclear etiology 10/6: Pelvic fluid collection, drain placed 10/7 L hemipelvis 42ml fluid aspiration  Central access:  PICC 12/09/20 TPN start date: 12/09/20  Nutritional Goals:  RD Estimated Needs Total Energy Estimated Needs: 1900-2100 Total Protein Estimated Needs: 85-100 grams Total Fluid Estimated Needs: >/= 1.8 L  Current Nutrition:  TPN Ensure BID - 1 dose charted yesterday Advanced to soft diet 10/11 - appetite returning 10/13. Per RD, recommend continuing TPN until pt demonstrating consistent ability to meet at least >50% of  estimated needs via oral route    Plan:  Per Surgery MD note ([place]), wean TPN to off when bag empty today (bag already completed at 0600, as patient was on 45-WU cyclic TPN) D/c TPN labs and nursing orders   Arturo Morton, PharmD, BCPS Please check AMION for all [place] contact numbers Clinical Pharmacist 12/19/2020 7:37 AM

## 2020-12-19 NOTE — Progress Notes (Signed)
Referring Physician(s): Tsuei,M  Supervising Physician: Daryll Brod  Patient Status:  [facility] - In-pt  Chief Complaint:  Right pelvic fluid collection  Subjective: Patient still having some tenderness at right transgluteal drain site but not as bad as before;  denies nausea /vomiting   Allergies: Patient has no known allergies.  Medications: Prior to Admission medications   Medication Sig Start Date End Date Taking? Authorizing Provider  dicyclomine (BENTYL) 20 MG tablet Take 1 tablet (20 mg total) by mouth 3 (three) times daily as needed for spasms (before meals). 12/01/20  Yes Gatha Mayer, MD  hydrochlorothiazide (HYDRODIURIL) 50 MG tablet Take 1/2 of a tablet by mouth every 24 hours for hypertension. Patient taking differently: Take 25 mg by mouth daily. 11/04/20  Yes   omeprazole (PRILOSEC) 40 MG capsule Take 40 mg by mouth daily.   Yes [provider]  timolol (TIMOPTIC) 0.5 % ophthalmic solution Place 1 drop into the left eye every morning.  11/19/14  Yes [provider]     Vital Signs: BP 137/81 (BP Location: Left Arm)   Pulse 97   Temp 98.8 F (37.1 C) (Oral)   Resp 18   Wt 128 lb 15.5 oz (58.5 kg)   SpO2 100%   BMI 18.24 kg/m   Physical Exam awake, alert.  Right transgluteal drain intact, output about 20 cc serosanguineous fluid, some difficulty and pain noted with drain irrigation.  Imaging: CT ABDOMEN PELVIS W CONTRAST  Result Date: 12/16/2020 CLINICAL DATA:  History of small bowel perforation, status post laparotomy resection, complicated by abscess, status post percutaneous drain placement EXAM: CT ABDOMEN AND PELVIS WITH CONTRAST TECHNIQUE: Multidetector CT imaging of the abdomen and pelvis was performed using the standard protocol following bolus administration of intravenous contrast. CONTRAST:  128mL OMNIPAQUE IOHEXOL 300 MG/ML SOLN, additional oral enteric contrast COMPARISON:  12/09/2020 FINDINGS: Lower chest: No acute  abnormality. Hepatobiliary: No solid liver abnormality is seen. No gallstones, gallbladder wall thickening, or biliary dilatation. Pancreas: Unremarkable. No pancreatic ductal dilatation or surrounding inflammatory changes. Spleen: Normal in size without significant abnormality. Adrenals/Urinary Tract: Adrenal glands are unremarkable. Kidneys are normal, without renal calculi, solid lesion, or hydronephrosis. Bladder is unremarkable. Stomach/Bowel: Stomach is within normal limits. Redemonstrated mid to distal small bowel resection and anastomosis in the posterior pelvis (series 3, image 47). Interval placement of a posterior approach percutaneous drain catheter in the right hemipelvis within a fluid collection, which is diminished in size, dominant component in the right pelvis measuring 4.1 x 1.6 cm, previously 8.2 x 3.7 cm (series 3, image 65). Additional fluid collection posterior to the cecum measuring 1.5 x 1.2 cm, likewise diminished in size, previously 4.2 x 1.6 cm (series 3, image 50). Diffusely thickened, inflamed appearing colon, particularly the cecum and sigmoid (series 3, image 52, 68). Sigmoid diverticulosis. Vascular/Lymphatic: Aortic atherosclerosis. No enlarged abdominal or pelvic lymph nodes. Reproductive: No mass or other significant abnormality. Other: Status post midline laparotomy. Subcutaneous emphysema about the right upper quadrant (series 3, image 36). Anasarca. No free-flowing ascites. Musculoskeletal: No acute or significant osseous findings. IMPRESSION: 1. Interval placement of a posterior approach percutaneous drain catheter in the right hemipelvis within a fluid collection, which is diminished in size, dominant component in the right pelvis measuring 4.1 x 1.6 cm, previously 8.2 x 3.7 cm. 2. Additional fluid collection posterior to the cecum measuring 1.5 x 1.2 cm, likewise diminished in size, previously 4.2 x 1.6 cm. 3. Redemonstrated mid to distal small bowel resection and  anastomosis in the posterior pelvis. 4. Diffusely thickened, inflamed appearing colon, possibly reactive to adjacent fluid collections. 5. Status post midline laparotomy. Aortic Atherosclerosis (ICD10-I70.0). Electronically Signed   By: Delanna Ahmadi M.D.   On: 12/16/2020 14:07   DG Shoulder Left Port  Result Date: 12/17/2020 CLINICAL DATA:  Left shoulder pain without known injury. EXAM: LEFT SHOULDER COMPARISON:  None. FINDINGS: There is no evidence of fracture or dislocation. There is no evidence of arthropathy or other focal bone abnormality. Soft tissues are unremarkable. IMPRESSION: Negative. Electronically Signed   By: Marijo Conception M.D.   On: 12/17/2020 11:48    Labs:  CBC: Recent Labs    12/09/20 0114 12/11/20 0350 12/15/20 0306 12/16/20 0425  WBC 14.7* 12.6* 12.9* 10.5  HGB 11.0* 10.5* 9.5* 10.2*  HCT 32.0* 32.1* 29.3* 31.0*  PLT 339 427* 590* 675*    COAGS: Recent Labs    12/10/20 0404  INR 1.1    BMP: Recent Labs    12/14/20 0326 12/15/20 0306 12/16/20 0425 12/17/20 0400 12/18/20 0411  NA 133* 132* 132* 133*  --   K 4.6 4.3 4.1 4.1  --   CL 101 102 101 101  --   CO2 26 25 25 25   --   GLUCOSE 124* 108* 99 127*  --   BUN 11 13 14 16   --   CALCIUM 8.8* 8.8* 9.3 8.7*  --   CREATININE 0.65 0.63 0.67 0.56* 0.59*  GFRNONAA >60 >60 >60 >60 >60    LIVER FUNCTION TESTS: Recent Labs    12/10/20 0404 12/12/20 0406 12/14/20 0326 12/17/20 0400  BILITOT 0.7 0.4 0.2* 0.3  AST 32 75* 88* 67*  ALT 33 74* 128* 123*  ALKPHOS 161* 160* 149* 146*  PROT 5.4* 5.3* 5.7* 5.8*  ALBUMIN 2.0* 2.1* 2.2* 2.4*    Assessment and Plan: Pt POD#15 - s/p ex lap with SBR for focal ischemic perforation - Dr. Zenia Resides 12/04/20;  status post CT-guided drainage of pelvic fluid collection on 10/7; afebrile, output 20 cc serosanguineous fluid, no new labs; drain fluid cultures with E. coli, Enterococcus, Bacteroides ;continue current treatment, if drain output remains low consider  follow-up imaging next week (last CT on 10/12)   Electronically Signed: D. Rowe Robert, PA-C 12/19/2020, 1:03 PM   I spent a total of 15 Minutes at the the patient's bedside AND on the patient's hospital floor or unit, greater than 50% of which was counseling/coordinating care for pelvic abscess drain    Patient ID: Juan David, male   DOB: 03/12/1955, 65 y.o.   MRN: [id number]

## 2020-12-19 NOTE — Progress Notes (Signed)
15 Days Post-Op   Subjective/Chief Complaint: No appetite    Objective: Vital signs in last 24 hours: Temp:  [98.3 F (36.8 C)-99.5 F (37.5 C)] 98.8 F (37.1 C) (10/15 0836) Pulse Rate:  [89-103] 97 (10/15 0836) Resp:  [17-18] 18 (10/15 0836) BP: (126-141)/(81-95) 137/81 (10/15 0836) SpO2:  [99 %-100 %] 100 % (10/15 0836) Last BM Date: 12/16/20  Intake/Output from previous day: 10/14 0701 - 10/15 0700 In: 1578.2 [P.O.:540; I.V.:1028.2] Out: 1170 [Urine:1150; Drains:20] Intake/Output this shift: Total I/O In: 120 [P.O.:120] Out: 175 [Urine:175]   General: pleasant, WD, thin male who is sitting up in chair in NAD HEENT: head is normocephalic, atraumatic. Mouth is pink and moist Heart: regular, rate, and rhythm.  Palpable radial pulses bilaterally Lungs: CTAB, no wheezes, rhonchi, or rales noted.  Respiratory effort nonlabored Abd: soft, ND, +BS, mild appropriate TTP around incision and along lower quadrants. No rebound or guarding. Staples intact in proximal wound. Most distal portion of wound opened -  f granulation tissue and appropriate bleeding. Fascial stitch intact. No surrounding erythema or induration Drain with SS fluid  output MSK: all 4 extremities are symmetrical with no cyanosis, clubbing, or edema. No calf TTP bilaterally. Point TTP over left lateral thoracic back immediately below scapula without heat, deformity, edema. ROM of left shoulder intact. Skin: warm and dry Psych: A&Ox3 with an appropriate affect.  Lab Results:  No results for input(s): WBC, HGB, HCT, PLT in the last 72 hours. BMET Recent Labs    12/17/20 0400 12/18/20 0411  NA 133*  --   K 4.1  --   CL 101  --   CO2 25  --   GLUCOSE 127*  --   BUN 16  --   CREATININE 0.56* 0.59*  CALCIUM 8.7*  --    PT/INR No results for input(s): LABPROT, INR in the last 72 hours. ABG No results for input(s): PHART, HCO3 in the last 72 hours.  Invalid input(s): PCO2, PO2  Studies/Results: DG  Shoulder Left Port  Result Date: 12/17/2020 CLINICAL DATA:  Left shoulder pain without known injury. EXAM: LEFT SHOULDER COMPARISON:  None. FINDINGS: There is no evidence of fracture or dislocation. There is no evidence of arthropathy or other focal bone abnormality. Soft tissues are unremarkable. IMPRESSION: Negative. Electronically Signed   By: Marijo Conception M.D.   On: 12/17/2020 11:48    Anti-infectives: Anti-infectives (From admission, onward)    Start     Dose/Rate Route Frequency Ordered Stop   12/15/20 1445  amoxicillin-clavulanate (AUGMENTIN) 875-125 MG per tablet 1 tablet        1 tablet Oral Every 12 hours 12/15/20 1347     12/04/20 0700  piperacillin-tazobactam (ZOSYN) IVPB 3.375 g  Status:  Discontinued        3.375 g 12.5 mL/hr over 240 Minutes Intravenous Every 8 hours 12/04/20 0218 12/15/20 1347   12/03/20 2315  piperacillin-tazobactam (ZOSYN) IVPB 3.375 g        3.375 g 100 mL/hr over 30 Minutes Intravenous  Once 12/03/20 2308 12/04/20 0002       Assessment/Plan: s/p Procedure(s): EXPLORATORY LAPAROTOMY small bowel resection (N/A)  POD#15 - s/p ex lap with SBR for focal ischemic perforation - Dr. Zenia Resides 12/04/20 - IR drain in RLQ 10/6 - IR following. Will need to flush drain QD and f/u with IR 10-14 days after d/c - CT 10/12 with dec fluid collection in R pelvis and dec fluid collection posterior to cecum - postop ileus -  NGT out 10/9. Tolerating soft diet, low appetite. Decrease TPN to off after bag empty   - mobilize, pulm toilet, IS - multi-modal pain control - lidoderm patch. Oral pain meds added. Oral robaxin. Discussed with patient plan to transition to oral pain meds today. - distal wound opened 10/10 - bid dressing changes. Changed by me this am - drain culture with pan sensitive e coli. Transitioned to PO augmentin 10/11     FEN - soft, ensure, IVFs, PICC/TPN 10/5 >> TPN 50% VTE - lovenox ID - zosyn 9/29 >10/11, augmentin 10/11>   Hx of rectal  adenocarcinoma Hx of DVT HTN ABL on chronic anemia      LOS: 15 days    Turner Daniels MD  12/19/2020

## 2020-12-19 NOTE — Plan of Care (Signed)

## 2020-12-20 MED ORDER — INFLUENZA VAC SPLIT QUAD 0.5 ML IM SUSY
0.5000 mL | PREFILLED_SYRINGE | INTRAMUSCULAR | Status: AC
  Administered 2020-12-21: 0.5 mL via INTRAMUSCULAR
  Filled 2020-12-20: qty 0.5

## 2020-12-20 NOTE — Progress Notes (Signed)
16 Days Post-Op   Subjective/Chief Complaint: NO NEW COMPLAINTS    Objective: Vital signs in last 24 hours: Temp:  [98.4 F (36.9 C)-99 F (37.2 C)] 98.4 F (36.9 C) (10/16 0823) Pulse Rate:  [87-96] 94 (10/16 0823) Resp:  [16-18] 18 (10/16 0823) BP: (123-138)/(83-93) 133/90 (10/16 0823) SpO2:  [99 %-100 %] 100 % (10/16 0823) Last BM Date: 12/19/20  Intake/Output from previous day: 10/15 0701 - 10/16 0700 In: 1050 [P.O.:1040] Out: 380 [Urine:375; Drains:5] Intake/Output this shift: Total I/O In: -  Out: 200 [Urine:200]   General: pleasant, WD, thin male who is sitting up in bed in NAD HEENT: head is normocephalic, atraumatic. Mouth is pink and moist Heart: regular, rate, and rhythm.  Palpable radial pulses bilaterally Lungs: CTAB, no wheezes, rhonchi, or rales noted.  Respiratory effort nonlabored Abd: soft, ND, +BS, mild appropriate TTP around incision and along lower quadrants. No rebound or guarding. Staples intact in proximal wound. Most distal portion of wound opened -   granulation tissue and appropriate bleeding.  No surrounding erythema or induration Drain with SS fluid  output MSK: all 4 extremities are symmetrical with no cyanosis, clubbing, or edema. No calf TTP bilaterally. Point TTP over left lateral thoracic back immediately below scapula without heat, deformity, edema. ROM of left shoulder intact. Skin: warm and dry Psych: A&Ox3 with an appropriate affect.  Lab Results:  No results for input(s): WBC, HGB, HCT, PLT in the last 72 hours. BMET Recent Labs    12/18/20 0411  CREATININE 0.59*   PT/INR No results for input(s): LABPROT, INR in the last 72 hours. ABG No results for input(s): PHART, HCO3 in the last 72 hours.  Invalid input(s): PCO2, PO2  Studies/Results: No results found.  Anti-infectives: Anti-infectives (From admission, onward)    Start     Dose/Rate Route Frequency Ordered Stop   12/15/20 1445  amoxicillin-clavulanate (AUGMENTIN)  875-125 MG per tablet 1 tablet        1 tablet Oral Every 12 hours 12/15/20 1347     12/04/20 0700  piperacillin-tazobactam (ZOSYN) IVPB 3.375 g  Status:  Discontinued        3.375 g 12.5 mL/hr over 240 Minutes Intravenous Every 8 hours 12/04/20 0218 12/15/20 1347   12/03/20 2315  piperacillin-tazobactam (ZOSYN) IVPB 3.375 g        3.375 g 100 mL/hr over 30 Minutes Intravenous  Once 12/03/20 2308 12/04/20 0002       Assessment/Plan: s/p Procedure(s): EXPLORATORY LAPAROTOMY small bowel resection (N/A) POD#16  - s/p ex lap with SBR for focal ischemic perforation - Dr. Zenia Resides 12/04/20 - IR drain in RLQ 10/6 - IR following. Will need to flush drain QD and need CT in a couple of days  - CT 10/12 with dec fluid collection in R pelvis and dec fluid collection posterior to cecum - postop ileus - resolved - TNA OFF- hopefully eat more - advance diet for more choices  - mobilize, pulm toilet, IS - multi-modal pain control -  - distal wound opened 10/10 - bid dressing changes. Changed by me this am - drain culture with pan sensitive e coli. Transitioned to PO augmentin 10/11     FEN - soft, ensure, IVFs, PICC/TPN 10/5 >> TPN 50% VTE - lovenox ID - zosyn 9/29 >10/11, augmentin 10/11>   Hx of rectal adenocarcinoma Hx of DVT HTN ABL on chronic anemia  LOS: 16 days    Juan Daniels  MD  12/20/2020

## 2020-12-21 ENCOUNTER — Inpatient Hospital Stay (HOSPITAL_COMMUNITY): Payer: 59

## 2020-12-21 ENCOUNTER — Other Ambulatory Visit: Payer: Self-pay | Admitting: Radiology

## 2020-12-21 ENCOUNTER — Other Ambulatory Visit (HOSPITAL_COMMUNITY): Payer: Self-pay

## 2020-12-21 DIAGNOSIS — E440 Moderate protein-calorie malnutrition: Secondary | ICD-10-CM | POA: Insufficient documentation

## 2020-12-21 DIAGNOSIS — Z9889 Other specified postprocedural states: Secondary | ICD-10-CM

## 2020-12-21 DIAGNOSIS — E43 Unspecified severe protein-calorie malnutrition: Secondary | ICD-10-CM | POA: Insufficient documentation

## 2020-12-21 LAB — COMPREHENSIVE METABOLIC PANEL
ALT: 88 U/L — ABNORMAL HIGH (ref 0–44)
AST: 44 U/L — ABNORMAL HIGH (ref 15–41)
Albumin: 2.7 g/dL — ABNORMAL LOW (ref 3.5–5.0)
Alkaline Phosphatase: 110 U/L (ref 38–126)
Anion gap: 6 (ref 5–15)
BUN: 16 mg/dL (ref 8–23)
CO2: 27 mmol/L (ref 22–32)
Calcium: 9.2 mg/dL (ref 8.9–10.3)
Chloride: 98 mmol/L (ref 98–111)
Creatinine, Ser: 0.71 mg/dL (ref 0.61–1.24)
GFR, Estimated: >60 mL/min (ref >60)
Glucose, Bld: 93 mg/dL (ref 70–99)
Potassium: 3.9 mmol/L (ref 3.5–5.1)
Sodium: 131 mmol/L — ABNORMAL LOW (ref 135–145)
Total Bilirubin: 0.4 mg/dL (ref 0.3–1.2)
Total Protein: 6.1 g/dL — ABNORMAL LOW (ref 6.5–8.1)

## 2020-12-21 LAB — CBC
HCT: 30.7 % — ABNORMAL LOW (ref 39.0–52.0)
Hemoglobin: 9.9 g/dL — ABNORMAL LOW (ref 13.0–17.0)
MCH: 28.4 pg (ref 26.0–34.0)
MCHC: 32.2 g/dL (ref 30.0–36.0)
MCV: 88 fL (ref 80.0–100.0)
Platelets: 558 10*3/uL — ABNORMAL HIGH (ref 150–400)
RBC: 3.49 MIL/uL — ABNORMAL LOW (ref 4.22–5.81)
RDW: 14.3 % (ref 11.5–15.5)
WBC: 7 10*3/uL (ref 4.0–10.5)
nRBC: 0 % (ref 0.0–0.2)

## 2020-12-21 MED ORDER — ACETAMINOPHEN 325 MG PO TABS: 650.0000 mg | ORAL_TABLET | Freq: Four times a day (QID) | ORAL | Status: AC

## 2020-12-21 MED ORDER — METHOCARBAMOL 750 MG PO TABS
750.0000 mg | ORAL_TABLET | Freq: Four times a day (QID) | ORAL | 0 refills | Status: AC | PRN
  Filled 2020-12-21: qty 28, 7d supply, fill #0

## 2020-12-21 MED ORDER — OXYCODONE HCL 5 MG PO TABS
5.0000 mg | ORAL_TABLET | Freq: Four times a day (QID) | ORAL | 0 refills | Status: AC | PRN
  Filled 2020-12-21: qty 25, 7d supply, fill #0

## 2020-12-21 MED ORDER — SIMETHICONE 80 MG PO CHEW
80.0000 mg | CHEWABLE_TABLET | Freq: Four times a day (QID) | ORAL | 0 refills | Status: DC | PRN
Start: 2020-12-21 — End: 2022-02-18

## 2020-12-21 MED ORDER — NORMAL SALINE FLUSH 0.9 % IV SOLN
10.0000 mL | Freq: Every day | INTRAVENOUS | 0 refills | Status: DC
  Filled 2020-12-21: qty 300, 30d supply, fill #0

## 2020-12-21 NOTE — Progress Notes (Signed)
65 y.o. male inpatient. History of ex lap and small bowel resection for focal ischemic perforation of unknown etiology. Found to have intra abdominal abscesses. IR placed a left and right transgluteal approach pelvis drain on 10.5.22. left transgluteal drain removed. Remaining right sided transgluteal drain evaluated for possible removal prior to discharge. Ct pelvic non con showed a resolved pelvis abscess Discussed case with Dr. Earleen Newport who recommends drain removal at this time.  Pelvic drain removed intact, no complications,  patient tolerated procedure well, dressing applied to exit site. RN made aware.  Post-removal instructions: 1- Okay  to shower/sponge bath 24 hours post-removal. 2- No submerging (swimming, bathing) for 7 days post-removal. 3- Change dressing PRN until site fully healed.

## 2020-12-21 NOTE — Progress Notes (Signed)
    Pelvic drain placed in IR 12/10/20 Follows with [facility] For DC today per [facility]  Orders have been placed for pt to follow up in IR Drain clinic 7-10 days He will hear from [facility] for time and date  Flush drain daily 5 cc sterile saline Record OP daily

## 2020-12-21 NOTE — Plan of Care (Signed)

## 2020-12-21 NOTE — Progress Notes (Signed)
Progress Note  17 Days Post-Op  Subjective: Tolerating soft diet with improving appetite. Having Bms. Shoulder pain improving. No nausea or emesis  Wife is bedside  Objective: Vital signs in last 24 hours: Temp:  [98.4 F (36.9 C)-99 F (37.2 C)] 99 F (37.2 C) (10/17 0409) Pulse Rate:  [83-95] 95 (10/17 0409) Resp:  [15-18] 15 (10/17 0409) BP: (118-137)/(79-90) 118/79 (10/17 0409) SpO2:  [99 %-100 %] 99 % (10/17 0409) Weight:  [53.5 kg] 53.5 kg (10/17 0500) Last BM Date: 12/20/20  Intake/Output from previous day: 10/16 0701 - 10/17 0700 In: 1035 [P.O.:1020] Out: 360 [Urine:350; Drains:10] Intake/Output this shift: No intake/output data recorded.  PE: General: pleasant, WD, thin male who is sitting up in chair in NAD HEENT: head is normocephalic, atraumatic. Mouth is pink and moist Heart: regular, rate, and rhythm.  Palpable radial pulses bilaterally Lungs: CTAB, no wheezes, rhonchi, or rales noted.  Respiratory effort nonlabored Abd: soft, ND, +BS, mild appropriate TTP around incision and along lower quadrants. No rebound or guarding. Staples intact in proximal wound. Most distal portion of wound opened -  fibrinous tissue at base of wound surrounded by granulation tissue and appropriate bleeding. Fascial stitch visible and intact. No surrounding erythema or induration Drain with scant serosanguinous output MSK: all 4 extremities are symmetrical with no cyanosis, clubbing, or edema. No calf TTP bilaterally.  Skin: warm and dry Psych: A&Ox3 with an appropriate affect.    Lab Results:  Recent Labs    12/21/20 0031  WBC 7.0  HGB 9.9*  HCT 30.7*  PLT 558*    BMET Recent Labs    12/21/20 0031  NA 131*  K 3.9  CL 98  CO2 27  GLUCOSE 93  BUN 16  CREATININE 0.71  CALCIUM 9.2    PT/INR No results for input(s): LABPROT, INR in the last 72 hours. CMP     Component Value Date/Time   NA 131 (L) 12/21/2020 0031   NA 139 12/27/2016 1237   K 3.9 12/21/2020  0031   K 4.2 12/27/2016 1237   CL 98 12/21/2020 0031   CO2 27 12/21/2020 0031   CO2 25 12/27/2016 1237   GLUCOSE 93 12/21/2020 0031   GLUCOSE 112 12/27/2016 1237   BUN 16 12/21/2020 0031   BUN 15.2 12/27/2016 1237   CREATININE 0.71 12/21/2020 0031   CREATININE 1.1 12/27/2016 1237   CALCIUM 9.2 12/21/2020 0031   CALCIUM 9.7 12/27/2016 1237   PROT 6.1 (L) 12/21/2020 0031   PROT 7.6 12/27/2016 1237   ALBUMIN 2.7 (L) 12/21/2020 0031   ALBUMIN 4.1 12/27/2016 1237   AST 44 (H) 12/21/2020 0031   AST 20 12/27/2016 1237   ALT 88 (H) 12/21/2020 0031   ALT 18 12/27/2016 1237   ALKPHOS 110 12/21/2020 0031   ALKPHOS 80 12/27/2016 1237   BILITOT 0.4 12/21/2020 0031   BILITOT 0.45 12/27/2016 1237   GFRNONAA >60 12/21/2020 0031   GFRAA >60 11/20/2017 0948   Lipase     Component Value Date/Time   LIPASE 17 12/03/2020 2023       Studies/Results: No results found.  Anti-infectives: Anti-infectives (From admission, onward)    Start     Dose/Rate Route Frequency Ordered Stop   12/15/20 1445  amoxicillin-clavulanate (AUGMENTIN) 875-125 MG per tablet 1 tablet        1 tablet Oral Every 12 hours 12/15/20 1347     12/04/20 0700  piperacillin-tazobactam (ZOSYN) IVPB 3.375 g  Status:  Discontinued  3.375 g 12.5 mL/hr over 240 Minutes Intravenous Every 8 hours 12/04/20 0218 12/15/20 1347   12/03/20 2315  piperacillin-tazobactam (ZOSYN) IVPB 3.375 g        3.375 g 100 mL/hr over 30 Minutes Intravenous  Once 12/03/20 2308 12/04/20 0002        Assessment/Plan POD#17 - s/p ex lap with SBR for focal ischemic perforation - Dr. Zenia Resides 12/04/20 - path with focal ischemic necrosis and viable margins - IR drain in RLQ 10/6 - IR following. Will need to flush drain QD and need CT in a couple of days - 10 ml out over last 24h - CT 10/12 with dec fluid collection in R pelvis and dec fluid collection posterior to cecum - postop ileus - resolved - TNA OFF- tolerating diet. Appetite  improving - mobilize, pulm toilet, IS - multi-modal pain control - distal wound opened 10/10 - bid dressing changes. Changed by me this am - drain culture with pan sensitive e coli, enterococcus. Transitioned to PO augmentin 10/11 - discharge on PPI - remaining staples removed today   Ready for discharge from surgical perspective - will reach out to IR regarding timing of follow up CT and see if that can be done prior to discharge today/tomorrow. Patient and wife hopeful for drain removal prior to d/c though we discussed this may not be feasible  FEN - soft, ensure, IVFs VTE - lovenox ID - zosyn 9/29 >10/11, augmentin 10/11>   Hx of rectal adenocarcinoma Hx of DVT HTN ABL on chronic anemia     LOS: 17 days    Winferd Humphrey, [facility] Surgery 12/21/2020, 7:50 AM Please see Amion for pager number during day hours 7:00am-4:30pm

## 2020-12-21 NOTE — Progress Notes (Signed)
Sheliah Mends to be D/C'd  per MD order.  Discussed with the patient and all questions fully answered.  VSS, Skin clean, dry and intact without evidence of skin break down, no evidence of skin tears noted.  IV catheter discontinued intact. Site without signs and symptoms of complications. Dressing and pressure applied.  An After Visit Summary was printed and given to the patient. Patient received prescription.  D/c education completed with patient/family including follow up instructions, medication list, d/c activities limitations if indicated, with other d/c instructions as indicated by MD - patient able to verbalize understanding, all questions fully answered.   Patient instructed to return to ED, call 911, or call MD for any changes in condition.   Patient to be escorted via [place], and D/C home via private auto.

## 2020-12-21 NOTE — Discharge Instructions (Signed)
[facility], [place] [phone]  OPEN ABDOMINAL SURGERY: POST OP INSTRUCTIONS  Always review your discharge instruction sheet given to you by the facility where your surgery was performed.  IF YOU HAVE DISABILITY OR FAMILY LEAVE FORMS, YOU MUST BRING THEM TO THE OFFICE FOR PROCESSING.  PLEASE DO NOT GIVE THEM TO YOUR DOCTOR.  A prescription for pain medication may be given to you upon discharge.  Take your pain medication as prescribed, if needed.  If narcotic pain medicine is not needed, then you may take acetaminophen (Tylenol) or ibuprofen (Advil) as needed. Take your usually prescribed medications unless otherwise directed. If you need a refill on your pain medication, please contact your pharmacy. They will contact our office to request authorization.  Prescriptions will not be filled after 5pm or on week-ends. You should follow a light diet the first few days after arrival home, such as soup and crackers, pudding, etc.unless your doctor has advised otherwise. A high-fiber, low fat diet can be resumed as tolerated.   Be sure to include lots of fluids daily. Most patients will experience some swelling and bruising on the chest and neck area.  Ice packs will help.  Swelling and bruising can take several days to resolve Most patients will experience some swelling and bruising in the area of the incision. Ice pack will help. Swelling and bruising can take several days to resolve..  It is common to experience some constipation if taking pain medication after surgery.  Increasing fluid intake and taking a stool softener will usually help or prevent this problem from occurring.  A mild laxative (Milk of Magnesia or Miralax) should be taken according to package directions if there are no bowel movements after 48 hours.  You may have steri-strips (small skin tapes) in place directly over the incision.  These strips should be left on the skin for 7-10 days.  If your surgeon used skin  glue on the incision, you may shower in 24 hours.  The glue will flake off over the next 2-3 weeks.  Any sutures or staples will be removed at the office during your follow-up visit. You may find that a light gauze bandage over your incision may keep your staples from being rubbed or pulled. You may shower and replace the bandage daily. ACTIVITIES:  You may resume regular (light) daily activities beginning the next day--such as daily self-care, walking, climbing stairs--gradually increasing activities as tolerated.  You may have sexual intercourse when it is comfortable.  Refrain from any heavy lifting or straining until approved by your doctor - at least 6 weeks from surgery date You may drive when you no longer are taking prescription pain medication, you can comfortably wear a seatbelt, and you can safely maneuver your car and apply brakes Return to Work: __10/25/22_________________________________ Dennis Bast should see your doctor in the office for a follow-up appointment approximately two weeks after your surgery.  Make sure that you call for this appointment within a day or two after you arrive home to insure a convenient appointment time. OTHER INSTRUCTIONS:  _____________________________________________________________ _____________________________________________________________  WHEN TO CALL YOUR DOCTOR: Fever over 101.0 Inability to urinate Nausea and/or vomiting Extreme swelling or bruising Continued bleeding from incision. Increased pain, redness, or drainage from the incision. Difficulty swallowing or breathing Muscle cramping or spasms. Numbness or tingling in hands or feet or around lips.  The clinic staff is available to answer your questions during regular business hours.  Please don't hesitate to  call and ask to speak to one of the nurses if you have concerns.  For further questions, please visit [email or web address]     Managing Your Pain After Surgery Without  Opioids    Thank you for participating in our program to help patients manage their pain after surgery without opioids. This is part of our effort to provide you with the best care possible, without exposing you or your family to the risk that opioids pose.  What pain can I expect after surgery? You can expect to have some pain after surgery. This is normal. The pain is typically worse the day after surgery, and quickly begins to get better. Many studies have found that many patients are able to manage their pain after surgery with Over-the-Counter (OTC) medications such as Tylenol and Motrin. If you have a condition that does not allow you to take Tylenol or Motrin, notify your surgical team.  How will I manage my pain? The best strategy for controlling your pain after surgery is around the clock pain control with Tylenol (acetaminophen) and Motrin (ibuprofen or Advil). Alternating these medications with each other allows you to maximize your pain control. In addition to Tylenol and Motrin, you can use heating pads or ice packs on your incisions to help reduce your pain.  How will I alternate your regular strength over-the-counter pain medication? You will take a dose of pain medication every three hours. Start by taking 650 mg of Tylenol (2 pills of 325 mg) 3 hours later take 600 mg of Motrin (3 pills of 200 mg) 3 hours after taking the Motrin take 650 mg of Tylenol 3 hours after that take 600 mg of Motrin.   - 1 -  See example - if your first dose of Tylenol is at 12:00 PM   12:00 PM Tylenol 650 mg (2 pills of 325 mg)  3:00 PM Motrin 600 mg (3 pills of 200 mg)  6:00 PM Tylenol 650 mg (2 pills of 325 mg)  9:00 PM Motrin 600 mg (3 pills of 200 mg)  Continue alternating every 3 hours   We recommend that you follow this schedule around-the-clock for at least 3 days after surgery, or until you feel that it is no longer needed. Use the table on the last page of this handout to keep track  of the medications you are taking. Important: Do not take more than 3000mg  of Tylenol or 3200mg  of Motrin in a 24-hour period. Do not take ibuprofen/Motrin if you have a history of bleeding stomach ulcers, severe kidney disease, &/or actively taking a blood thinner  What if I still have pain? If you have pain that is not controlled with the over-the-counter pain medications (Tylenol and Motrin or Advil) you might have what we call "breakthrough" pain. You will receive a prescription for a small amount of an opioid pain medication such as Oxycodone, Tramadol, or Tylenol with Codeine. Use these opioid pills in the first 24 hours after surgery if you have breakthrough pain. Do not take more than 1 pill every 4-6 hours.  If you still have uncontrolled pain after using all opioid pills, don't hesitate to call our staff using the number provided. We will help make sure you are managing your pain in the best way possible, and if necessary, we can provide a prescription for additional pain medication.   Day 1    Time  Name of Medication Number of pills taken  Amount of Acetaminophen  Pain Level  Comments  AM PM       AM PM       AM PM       AM PM       AM PM       AM PM       AM PM       AM PM       Total Daily amount of Acetaminophen Do not take more than  3,000 mg per day      Day 2    Time  Name of Medication Number of pills taken  Amount of Acetaminophen  Pain Level   Comments  AM PM       AM PM       AM PM       AM PM       AM PM       AM PM       AM PM       AM PM       Total Daily amount of Acetaminophen Do not take more than  3,000 mg per day      Day 3    Time  Name of Medication Number of pills taken  Amount of Acetaminophen  Pain Level   Comments  AM PM       AM PM       AM PM       AM PM          AM PM       AM PM       AM PM       AM PM       Total Daily amount of Acetaminophen Do not take more than  3,000 mg per day      Day 4     Time  Name of Medication Number of pills taken  Amount of Acetaminophen  Pain Level   Comments  AM PM       AM PM       AM PM       AM PM       AM PM       AM PM       AM PM       AM PM       Total Daily amount of Acetaminophen Do not take more than  3,000 mg per day      Day 5    Time  Name of Medication Number of pills taken  Amount of Acetaminophen  Pain Level   Comments  AM PM       AM PM       AM PM       AM PM       AM PM       AM PM       AM PM       AM PM       Total Daily amount of Acetaminophen Do not take more than  3,000 mg per day       Day 6    Time  Name of Medication Number of pills taken  Amount of Acetaminophen  Pain Level  Comments  AM PM       AM PM       AM PM       AM PM       AM PM       AM PM       AM  PM       AM PM       Total Daily amount of Acetaminophen Do not take more than  3,000 mg per day      Day 7    Time  Name of Medication Number of pills taken  Amount of Acetaminophen  Pain Level   Comments  AM PM       AM PM       AM PM       AM PM       AM PM       AM PM       AM PM       AM PM       Total Daily amount of Acetaminophen Do not take more than  3,000 mg per day        For additional information about how and where to safely dispose of unused opioid medications - [employer]  Disclaimer: This document contains information and/or instructional materials adapted from [place] for the typical patient with your condition. It does not replace medical advice from your health care provider because your experience may differ from that of the typical patient. Talk to your health care provider if you have any questions about this document, your condition or your treatment plan. Adapted from [place]

## 2020-12-21 NOTE — Discharge Summary (Signed)
Patient ID: Juan David [id number] Apr 21, 1955 65 y.o.  Admit date: 12/03/2020 Discharge date: 12/21/2020  Admitting Diagnosis: Pneumoperitoneum H/O DVT HTN Rectal adenocarcinoma  Discharge Diagnosis Patient Active Problem List   Diagnosis Date Noted   History of exploratory laparotomy 12/21/2020   Protein-calorie malnutrition, severe 12/21/2020   Small bowel perforation ([place]) 12/04/2020   Rectal adenocarcinoma ([place]) 09/28/2016  POD#17 - s/p ex lap with SBR for focal ischemic perforation - Dr. Zenia Resides 12/04/20 Hx of rectal adenocarcinoma Hx of DVT HTN ABL on chronic anemia  Consultants none  Reason for Admission: Juan David is a 65 year old male who presented to the ED with worsening abdominal pain.  He has been having abdominal pain and diarrhea for the last few weeks.  He reports severe pain after he eats, and as a result has had a 10 pound weight loss over the last 2 weeks.  He was previously seen in the ED on 9/12, at which time his CT scan did not show any etiology for his pain.  He saw Dr. Carlean Purl at [facility] a few days ago and was scheduled for EGD and colonoscopy next week for work-up.  Today his pain has acutely worsened, prompting him to go to the ED.  He says the pain started in the upper abdomen and radiates across the abdomen.  CT scan showed multiple foci of free air, suspicious for perforated gastric or duodenal ulcer.  General surgery was consulted.   He has had no prior abdominal surgeries.  He denies any use of NSAIDs.  He has a history of T2 rectal cancer excised via transanal biopsy in 2018 and for which he was treated with chemoradiation and declined to undergo APR.  He has not had oncology follow-up since 2019.  Procedures Dr. Zenia Resides, 12/04/20 Exploratory laparotomy, small bowel resection with primary reanastomosis  Dr. Sandi Mariscal, 10/6 CT guided percutaneous drain placement  Hospital Course:  POD#17 - s/p ex lap with SBR for focal ischemic  perforation - Dr. Zenia Resides 12/04/20 The patient was admitted and underwent the above procedure with focal ischemic perforation noted of unclear etiology, pathology confirmed this.  He was placed on zosyn at the time of admission.  He developed a post op ileus and did require TNA to be started.  This was able to be weaned off as his ileus resolved and he started taking in more nutrition.  He did undergo a CT scan on POD 7, due to his ileus and was noted to have an intra-abdominal abscess.  This was drained by IR on 10/6.  His drain revealed pan sensitive e coli and enterococcus.  He was switched to PO augmentin on 10/11 and completed abx therapy on 10/17 prior to discharge.  He was noted to have a small amount of drainage from the inferior aspect of his wound and several staples were removed and he was started on WD dressing changes and will continue these at discharge.  His remaining staples were able to be removed prior to discharge with good wound healing.  He underwent repeat CT scan prior to discharge which revealed significantly improved fluid collection and his drain was able to be removed prior to discharge.    Hx of rectal adenocarcinoma Hx of DVT HTN ABL on chronic anemia -- These all remained stable during his stay with no other acute interventions warranted. --  Allergies as of 12/21/2020   No Known Allergies      Medication List     TAKE these  medications    acetaminophen 325 MG tablet Commonly known as: TYLENOL Take 2 tablets (650 mg total) by mouth every 6 (six) hours for 7 days.   dicyclomine 20 MG tablet Commonly known as: BENTYL Take 1 tablet (20 mg total) by mouth 3 (three) times daily as needed for spasms (before meals).   hydrochlorothiazide 50 MG tablet Commonly known as: HYDRODIURIL Take 1/2 of a tablet by mouth every 24 hours for hypertension. What changed:  how much to take how to take this when to take this   methocarbamol 750 MG tablet Commonly known as:  ROBAXIN Take 1 tablet (750 mg total) by mouth every 6 (six) hours as needed for up to 7 days for muscle spasms.   omeprazole 40 MG capsule Commonly known as: PRILOSEC Take 40 mg by mouth daily.   oxyCODONE 5 MG immediate release tablet Commonly known as: Oxy IR/ROXICODONE Take 1 tablet (5 mg total) by mouth every 6 (six) hours as needed for up to 5 days for moderate or severe pain.   simethicone 80 MG chewable tablet Commonly known as: MYLICON Chew 1 tablet (80 mg total) by mouth 4 (four) times daily as needed for flatulence.   sodium chloride 0.9 % injection 10 mLs by Intracatheter route daily.   timolol 0.5 % ophthalmic solution Commonly known as: TIMOPTIC Place 1 drop into the left eye every morning.          Follow-up Information     Dwan Bolt, MD. Call on 01/05/2021.   Specialty: General Surgery Why: Please follow up on 01/05/21 at 9:15 am. Please arrive 30 minutes early to complete check in process and bring photo ID and insurance card if you have them Contact information: [place] [place] [address] [phone]         Gatha Mayer, MD. Call.   Specialty: Gastroenterology Contact information: [address] [place] [address] [phone]         Leonard Downing, MD. Call.   Specialty: Family Medicine Why: call to follow up with PCP Contact information: [place] [address] [phone]         Sandi Mariscal, MD Follow up.   Specialties: Interventional Radiology, Radiology Why: They will call you with appointment date and time for follow up CT scan and drain clinic appointment Contact information: [place] [address] [phone]                 Signed: Saverio Danker, [facility] 12/21/2020, 3:20 PM Please see Amion for pager number during day hours 7:00am-4:30pm, 7-11:30am on Weekends

## 2020-12-22 ENCOUNTER — Encounter: Payer: Self-pay | Admitting: *Deleted

## 2020-12-22 ENCOUNTER — Telehealth: Payer: Self-pay | Admitting: Hematology

## 2020-12-22 ENCOUNTER — Other Ambulatory Visit: Payer: Self-pay | Admitting: *Deleted

## 2020-12-22 NOTE — Patient Outreach (Signed)
[facility]) Care Management  12/22/2020  SECUNDINO ELLITHORPE May 18, 1955 [id number]   Transition of care call/case closure   Referral received:12/07/20 Initial outreach:12/22/20 Insurance: [employer]    Subjective: Initial successful telephone call to patient's preferred number in order to complete transition of care assessment; Spoke with patient wife Jeanine Luz, designated party release, 2 HIPAA identifiers verified. Explained purpose of call and completed transition of care assessment.  Wife reports Mr. Perrell is doing okay just tired, resting well at night. She denies post-operative problems, says surgical incisions are unremarkable no redness, no signs of infection no fever she monitors temperature daily.  She discussed drains removed prior to discharge  on yesterday and she continues to do dressing changes . She states surgical pain well managed with prescribed medications. She reports patient tolerating diet, small amounts, including ensure in meal plan. He  denies bowel or bladder problems he has had 2 bm's since discharge home. Wife reports patient tolerating walks in the home, he continues to use incentive spirometry hourly while awake.   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   Reviewed accessing the following [employer] Benefits :  Wife unsure if patient has the hospital indemnity, provided [employer] contact number to file a claim if needed. She reports that they plan to make contact with [employer] on today.  He uses a  [facility] outpatient pharmacy at [facility].      Objective:  Mr. Keola Heninger was hospitalized at [facility] 9/30-10/17/22 for  Abdominal pain, perforation, Exploratory Laparotomy small bowel resection , abdominal abscess drain placement , drain removed 12/21/20 Comorbidities include: Chronic anemia, rectal adenocarcinoma,  He was discharged to  home on 12/21/20 without the need for home health services or DME.   Assessment:  Patient voices good understanding of all discharge instructions.  See transition of care flowsheet for assessment details.   Plan:  Reviewed hospital discharge diagnosis of Exploratory lap small bowel resection   and discharge treatment plan using hospital discharge instructions, assessing medication adherence, reviewing problems requiring provider notification, and discussing the importance of follow up with surgeon, primary care provider and/or specialists as directed.    No ongoing care management needs identified so will close case to [facility] Management services and route successful outreach letter with [facility] Management pamphlet and 24 Hour Nurse Line Magnet to [facility] clinical pool to be mailed to patient's home address.    Joylene Draft, RN, BSN  [facility] Management Coordinator  [phone]- Mobile [phone]- Toll Free Main Office

## 2020-12-22 NOTE — Telephone Encounter (Signed)
Scheduled per sch msg. Called and spoke with patients wife. Confirmed appt  

## 2020-12-23 ENCOUNTER — Other Ambulatory Visit: Payer: Self-pay | Admitting: Internal Medicine

## 2020-12-23 DIAGNOSIS — T8149XA Infection following a procedure, other surgical site, initial encounter: Secondary | ICD-10-CM

## 2020-12-28 ENCOUNTER — Inpatient Hospital Stay: Payer: 59 | Admitting: Hematology

## 2021-01-06 ENCOUNTER — Other Ambulatory Visit: Payer: Self-pay

## 2021-01-06 ENCOUNTER — Telehealth: Payer: Self-pay | Admitting: Internal Medicine

## 2021-01-06 DIAGNOSIS — R194 Change in bowel habit: Secondary | ICD-10-CM

## 2021-01-06 DIAGNOSIS — R1013 Epigastric pain: Secondary | ICD-10-CM

## 2021-01-06 DIAGNOSIS — R634 Abnormal weight loss: Secondary | ICD-10-CM

## 2021-01-06 DIAGNOSIS — C20 Malignant neoplasm of rectum: Secondary | ICD-10-CM

## 2021-01-06 NOTE — Telephone Encounter (Signed)
Pt has been scheduled per your request. Amb referral placed for auth purposes. Okay to proceed with Miralax split dose prep? Will send prep instructions once you let me know which you prefer.

## 2021-01-06 NOTE — Telephone Encounter (Signed)
I was aware but had held off scheduling it because I was watching his hospitalization.  He went home at the latter part of October and I actually looked at things today and messaged Dr. Zenia Resides about this though she is away.  Lets do this:  Search to see if a colleague has a double slot before that and let me know  But hold my January 19 1129 spot in case we need to use that

## 2021-01-06 NOTE — Telephone Encounter (Signed)
Dr. Candis Schatz - 11/4 @ 230 and 3 Dr. Tarri Glenn - 11/14 @ 3 and 330

## 2021-01-06 NOTE — Telephone Encounter (Signed)
Spoke to wife and wants me to do procedures  I think ok to schedule double at 1130 on 11/15  I will start early that day and I will explain to [facility] staff  Just confirm with me when it is set up

## 2021-01-06 NOTE — Telephone Encounter (Signed)
Patient's wife called.  Dr. Zenia Resides, General Surgeon, wants patient to have endo/colon prior to 01/26/21 when patient is supposed to see him.  Wife says doctor has contacted Dr. Carlean Purl regarding this (?)  There are no spots to do both before December.  Please call patient's wife and advise.

## 2021-01-07 ENCOUNTER — Other Ambulatory Visit: Payer: 59

## 2021-01-07 ENCOUNTER — Encounter: Payer: Self-pay | Admitting: Internal Medicine

## 2021-01-07 NOTE — Telephone Encounter (Signed)
Agree w/ Miralax split dose

## 2021-01-07 NOTE — Telephone Encounter (Signed)
Called and spoke with pt wife. Requested that I print the prep instructions and place them at the front desk for pick up. Per her request, instructions for Miralax split dose has been generated, printed and placed at the front desk for her to pick up later today.

## 2021-01-08 ENCOUNTER — Encounter: Payer: Self-pay | Admitting: Internal Medicine

## 2021-01-14 ENCOUNTER — Telehealth: Payer: Self-pay

## 2021-01-14 NOTE — Telephone Encounter (Signed)
Colonoscopy cancelled for 01/19/2021 in the [facility] Notified pt wife Juan David that the Colonoscopy has been canceled for 01/19/2021. Juan David was given in detail why the change in Location and she stated that she completely understands.   I did inform her that we will contact her with a future date and time of the Colonoscopy.

## 2021-01-14 NOTE — Telephone Encounter (Signed)
-----   Message from Gatha Mayer, MD sent at 01/14/2021  9:28 AM EST ----- Regarding: FW: [place] pt Juan David - contact patient/wife and explain that our anesthesia pre-procedure check has uncovered that the patient has a difficult airway and will need his procedure at the hospital   I do not know right now when I can do the procedure but will work on it and see.  This may delay things, unfortunately. I know that we are hoping to do it before surgery appointment later in month but safety first and limited availability at hospital  Please respond to me after you notify - creat a phone note  Thx  CEG ----- Message ----- From: Osvaldo Angst, CRNA Sent: 01/13/2021   5:32 PM EST To: Gatha Mayer, MD Subject: [facility] pt                                         Dr. Carlean Purl,  This pt is scheduled for a procedure with you on 11/15.  He is a documented difficult intubation and his procedure will need to be done at the hospital.  Thanks,  Osvaldo Angst

## 2021-01-18 ENCOUNTER — Encounter: Payer: Self-pay | Admitting: Hematology

## 2021-01-18 ENCOUNTER — Other Ambulatory Visit: Payer: Self-pay

## 2021-01-18 ENCOUNTER — Inpatient Hospital Stay: Payer: 59

## 2021-01-18 ENCOUNTER — Inpatient Hospital Stay: Payer: 59 | Admitting: Hematology

## 2021-01-18 VITALS — BP 140/96 | HR 98 | Resp 18 | Ht 70.5 in | Wt 115.6 lb

## 2021-01-18 DIAGNOSIS — K6389 Other specified diseases of intestine: Secondary | ICD-10-CM | POA: Diagnosis not present

## 2021-01-18 DIAGNOSIS — Z85048 Personal history of other malignant neoplasm of rectum, rectosigmoid junction, and anus: Secondary | ICD-10-CM | POA: Insufficient documentation

## 2021-01-18 DIAGNOSIS — K7689 Other specified diseases of liver: Secondary | ICD-10-CM | POA: Diagnosis not present

## 2021-01-18 DIAGNOSIS — K5651 Intestinal adhesions [bands], with partial obstruction: Secondary | ICD-10-CM | POA: Diagnosis not present

## 2021-01-18 DIAGNOSIS — K56609 Unspecified intestinal obstruction, unspecified as to partial versus complete obstruction: Secondary | ICD-10-CM | POA: Diagnosis not present

## 2021-01-18 DIAGNOSIS — Z9049 Acquired absence of other specified parts of digestive tract: Secondary | ICD-10-CM | POA: Diagnosis not present

## 2021-01-18 DIAGNOSIS — Z9221 Personal history of antineoplastic chemotherapy: Secondary | ICD-10-CM | POA: Diagnosis not present

## 2021-01-18 DIAGNOSIS — C20 Malignant neoplasm of rectum: Secondary | ICD-10-CM

## 2021-01-18 DIAGNOSIS — Z800 Family history of malignant neoplasm of digestive organs: Secondary | ICD-10-CM | POA: Diagnosis not present

## 2021-01-18 DIAGNOSIS — Z923 Personal history of irradiation: Secondary | ICD-10-CM | POA: Insufficient documentation

## 2021-01-18 DIAGNOSIS — R109 Unspecified abdominal pain: Secondary | ICD-10-CM | POA: Diagnosis not present

## 2021-01-18 DIAGNOSIS — K56699 Other intestinal obstruction unspecified as to partial versus complete obstruction: Secondary | ICD-10-CM | POA: Diagnosis not present

## 2021-01-18 DIAGNOSIS — Z86718 Personal history of other venous thrombosis and embolism: Secondary | ICD-10-CM | POA: Diagnosis not present

## 2021-01-18 DIAGNOSIS — F17290 Nicotine dependence, other tobacco product, uncomplicated: Secondary | ICD-10-CM | POA: Diagnosis present

## 2021-01-18 DIAGNOSIS — Q2112 Patent foramen ovale: Secondary | ICD-10-CM | POA: Diagnosis not present

## 2021-01-18 DIAGNOSIS — Z4682 Encounter for fitting and adjustment of non-vascular catheter: Secondary | ICD-10-CM | POA: Diagnosis not present

## 2021-01-18 DIAGNOSIS — K5730 Diverticulosis of large intestine without perforation or abscess without bleeding: Secondary | ICD-10-CM | POA: Diagnosis not present

## 2021-01-18 DIAGNOSIS — K5669 Other partial intestinal obstruction: Secondary | ICD-10-CM | POA: Diagnosis not present

## 2021-01-18 DIAGNOSIS — I10 Essential (primary) hypertension: Secondary | ICD-10-CM | POA: Diagnosis not present

## 2021-01-18 DIAGNOSIS — M1990 Unspecified osteoarthritis, unspecified site: Secondary | ICD-10-CM | POA: Diagnosis not present

## 2021-01-18 DIAGNOSIS — Z20822 Contact with and (suspected) exposure to covid-19: Secondary | ICD-10-CM | POA: Diagnosis not present

## 2021-01-18 DIAGNOSIS — R066 Hiccough: Secondary | ICD-10-CM | POA: Diagnosis not present

## 2021-01-18 DIAGNOSIS — R7989 Other specified abnormal findings of blood chemistry: Secondary | ICD-10-CM | POA: Diagnosis not present

## 2021-01-18 DIAGNOSIS — Z8719 Personal history of other diseases of the digestive system: Secondary | ICD-10-CM | POA: Diagnosis not present

## 2021-01-18 LAB — COMPREHENSIVE METABOLIC PANEL
ALT: 20 U/L (ref 0–44)
AST: 17 U/L (ref 15–41)
Albumin: 3.8 g/dL (ref 3.5–5.0)
Alkaline Phosphatase: 101 U/L (ref 38–126)
Anion gap: 10 (ref 5–15)
BUN: 18 mg/dL (ref 8–23)
CO2: 25 mmol/L (ref 22–32)
Calcium: 9.7 mg/dL (ref 8.9–10.3)
Chloride: 102 mmol/L (ref 98–111)
Creatinine, Ser: 0.81 mg/dL (ref 0.61–1.24)
GFR, Estimated: >60 mL/min (ref >60)
Glucose, Bld: 105 mg/dL — ABNORMAL HIGH (ref 70–99)
Potassium: 3.8 mmol/L (ref 3.5–5.1)
Sodium: 137 mmol/L (ref 135–145)
Total Bilirubin: 0.5 mg/dL (ref 0.3–1.2)
Total Protein: 8 g/dL (ref 6.5–8.1)

## 2021-01-18 LAB — CBC WITH DIFFERENTIAL/PLATELET
Abs Immature Granulocytes: 0.01 10*3/uL (ref 0.00–0.07)
Basophils Absolute: 0 10*3/uL (ref 0.0–0.1)
Basophils Relative: 0 %
Eosinophils Absolute: 0.2 10*3/uL (ref 0.0–0.5)
Eosinophils Relative: 3 %
HCT: 38.4 % — ABNORMAL LOW (ref 39.0–52.0)
Hemoglobin: 12.3 g/dL — ABNORMAL LOW (ref 13.0–17.0)
Immature Granulocytes: 0 %
Lymphocytes Relative: 30 %
Lymphs Abs: 1.8 10*3/uL (ref 0.7–4.0)
MCH: 28.2 pg (ref 26.0–34.0)
MCHC: 32 g/dL (ref 30.0–36.0)
MCV: 88.1 fL (ref 80.0–100.0)
Monocytes Absolute: 0.5 10*3/uL (ref 0.1–1.0)
Monocytes Relative: 8 %
Neutro Abs: 3.5 10*3/uL (ref 1.7–7.7)
Neutrophils Relative %: 59 %
Platelets: 336 10*3/uL (ref 150–400)
RBC: 4.36 MIL/uL (ref 4.22–5.81)
RDW: 14.2 % (ref 11.5–15.5)
WBC: 6 10*3/uL (ref 4.0–10.5)
nRBC: 0 % (ref 0.0–0.2)

## 2021-01-18 LAB — CEA (IN HOUSE-CHCC): CEA (CHCC-In House): 2.35 ng/mL (ref 0.00–5.00)

## 2021-01-18 LAB — FERRITIN: Ferritin: 3303 ng/mL — ABNORMAL HIGH (ref 24–336)

## 2021-01-18 NOTE — Progress Notes (Signed)
[facility]   [phone] [phone]   Clinic Follow up Note   Patient Care Team: Leonard Downing, MD as PCP - General (Family Medicine) Leighton Ruff, MD as Consulting Physician (General Surgery) Kyung Rudd, MD as Consulting Physician (Radiation Oncology) Truitt Merle, MD as Consulting Physician (Hematology)  Date of Service:  01/18/2021  CHIEF COMPLAINT: f/u of rectal cancer and recent surgery   CURRENT THERAPY:  Surveillance  ASSESSMENT & PLAN:  Juan David is a 65 y.o. male with   1. Perforated small bowel 12/03/20 -presented to ED 12/03/20 with abdominal pain, fever, nausea, and abdominal pain, found to have bowel perforation on CT. (No perforation seen on 11/16/20 CT.) -he underwent emergent resection on 12/04/20 with Dr. Zenia Resides. Pathology was benign. -he subsequently developed an abscess, requiring drain placement. Most recent CT pelvis from 12/21/20 showed continued decrease in size of abscess. -there has been no concern for cancer recurrence or metastasis on recent CT or during surgical exploration for his bowel perforation. I discussed this with the patient and his wife today. We will continue to follow him clinically. -lab from 12/21/20 reviewed, showing mild anemia, elevated platelets. I offered to repeat lab today, he is agreeable. I will order CBC, CMP, iron panel, and tumor marker. -I encouraged him to follow-up with his surgeon Dr. Zenia Resides and GI Dr. Arelia Longest to see if he needs EGD and colonoscopy   2. Adenocarcinoma of the distal rectum, pT2cN0M0, stage I -presented to ED 07/26/16 with rectal bleeding. He underwent hemorrhoidectomy 08/26/16, showed anal canal mass. -resection on 09/01/16 by Dr. Marcello Moores, surgical path reviewed a 1.3 cm adenocarcinoma with invasion into anal sphincter muscle.  -Pelvic MRI 09/22/16 showed no residual tumor and no adenopathy.  -Patient completed adjuvant concurrent chemo and radiation 10/10/16-11/22/16,  tolerated well overall. -he was lost to f/u after 11/2017. -it has been 4.5 years since his initial diagnosis, the risk of cancer recurrence is minimal now.  I do not think he needs a PET scan.    PLAN: -proceed to lab today -lab and f/u in 6 months   No problem-specific Assessment & Plan notes found for this encounter.   SUMMARY OF ONCOLOGIC HISTORY: Oncology History Overview Note  Cancer Staging Rectal adenocarcinoma [facility]) Staging form: Colon and Rectum, AJCC 8th Edition - Pathologic stage from 09/01/2016: Stage I (pT2, pN0, cM0) - Signed by Truitt Merle, MD on 10/01/2016     Rectal adenocarcinoma ([place])  09/01/2016 Initial Diagnosis   Rectal adenocarcinoma ([place])   09/01/2016 Surgery   Hemorrhoidectomy 09/01/2016. Noted to have a mobile right lateral anal canal mass. Dr. Leighton Ruff.    09/01/2016 Pathology Results   Invasive adenocarcinoma, well-differentiated, spanning 1.3 cm. The tumor invaded into the anal sphincter muscle. Resection margins were negative. The pathologist notes the tumor would be best staged as pT2 given involvement of muscle.    09/06/2016 Imaging   CT scans chest/abdomen/pelvis 09/06/2016 showed no evidence of metastatic disease.    09/22/2016 Imaging   Pelvic MRI 09/22/2016 showed no definite residual anal tumor. There was no evidence of tumor involvement of the external sphincter or adjacent organs. There was no pelvic lymphadenopathy.   10/10/2016 - 11/22/2016 Radiation Therapy   Patient begun adjuvant radiation with Dr. Lisbeth Renshaw   10/10/2016 - 11/22/2016 Chemotherapy   xeloda 1500mg , twice daily.   11/24/2017 Imaging   CT CAP W contrast 11/24/17  IMPRESSION: 1. Stable exam. No new or progressive interval findings to suggest metastatic disease. 2.  Aortic Atherosclerois (ICD10-170.0) 3.  Emphysema. (EXH37-J69.9)       INTERVAL HISTORY:  Juan David is here for a follow up of rectal cancer. He was last seen by me on 3 years ago. He presents to the  clinic accompanied by his wife. He has lost 20 lbs in the last 2 months. He reports abdominal pain right below his rib cage.   All other systems were reviewed with the patient and are negative.  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 eye, as defined in [place]    per pt only see light   PFO (patent foramen ovale)    per TEE done 05-11-2009    Rectal adenocarcinoma ([place]) 09/28/2016   Traumatic glaucoma, 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 at [facility] in [place]   08-18-2001   Wears glasses     SURGICAL HISTORY: Past Surgical History:  Procedure Laterality Date   ARTHROSCOPIC REPAIR ACL Right [place] LEFT EYE INJURY  08-18-2001   [facility]   ruptured globe- repair corneal laceration, reposition of prolapsed uveal tissue   HEMORRHOID SURGERY N/A 09/01/2016   Procedure: HEMORRHOIDECTOMY;  Surgeon: Leighton Ruff, MD;  Location: [facility];  Service: General;  Laterality: N/A;   LAPAROTOMY N/A 12/04/2020   Procedure: EXPLORATORY LAPAROTOMY small bowel resection;  Surgeon: Dwan Bolt, MD;  Location: [facility];  Service: General;  Laterality: N/A;   SUPERFICIAL KERATECTOMY Left 06-29-2011    [facility]    with EDTA scrub of left eye   TEE WITHOUT CARDIOVERSION  05-11-2009  dr Dorris Carnes   LVSEF 55-65%/  mild thickened AV without AI/  trace MR/ mixed fixed artherosclerosis plaqueing thoracic aorta/  no evidence thrombus/  by agitated saling and color doppler there was a PFO    I have reviewed the social history and  family history with the patient and they are unchanged from previous note.  ALLERGIES:  has No Known Allergies.  MEDICATIONS:  Current Outpatient Medications  Medication Sig Dispense Refill   dicyclomine (BENTYL) 20 MG tablet Take 1 tablet (20 mg total) by mouth 3 (three) times daily as needed for spasms (before meals). 90 tablet 0   hydrochlorothiazide (HYDRODIURIL) 50 MG tablet Take 1/2 of a tablet by mouth every 24 hours for hypertension. (Patient taking differently: Take 25 mg by mouth daily.) 90 tablet 0   omeprazole (PRILOSEC) 40 MG capsule Take 40 mg by mouth daily.     simethicone (MYLICON) 80 MG chewable tablet Chew 1 tablet (80 mg total) by mouth 4 (four) times daily as needed for flatulence. 30 tablet 0   Sodium Chloride Flush (NORMAL SALINE FLUSH) 0.9 % SOLN Flush abscess drain once daily with 5-10 mls 300 mL 0   timolol (TIMOPTIC) 0.5 % ophthalmic solution Place 1 drop into the left eye every morning.   11   No current facility-administered medications for this visit.  PHYSICAL EXAMINATION: ECOG PERFORMANCE STATUS: 1 - Symptomatic but completely ambulatory  Vitals:   01/18/21 1303  BP: (!) 140/96  Pulse: 98  Resp: 18  SpO2: 97%   Wt Readings from Last 3 Encounters:  01/18/21 115 lb 9.6 oz (52.4 kg)  12/21/20 117 lb 15.1 oz (53.5 kg)  12/01/20 123 lb 12.8 oz (56.2 kg)     GENERAL:alert, no distress and comfortable SKIN: skin color, texture, turgor are normal, no rashes or significant lesions EYES: normal, Conjunctiva are pink and non-injected, sclera clear  NECK: supple, thyroid normal size, non-tender, without nodularity LYMPH:  no palpable lymphadenopathy in the cervical, axillary  LUNGS: clear to auscultation and percussion with normal breathing effort HEART: regular rate & rhythm and no murmurs and no lower extremity edema ABDOMEN:abdomen soft, non-tender and normal bowel sounds Musculoskeletal:no cyanosis of digits and no clubbing  NEURO: alert & oriented  x 3 with fluent speech, no focal motor/sensory deficits  LABORATORY DATA:  I have reviewed the data as listed CBC Latest Ref Rng & Units 01/18/2021 12/21/2020 12/16/2020  WBC 4.0 - 10.5 K/uL 6.0 7.0 10.5  Hemoglobin 13.0 - 17.0 g/dL 12.3(L) 9.9(L) 10.2(L)  Hematocrit 39.0 - 52.0 % 38.4(L) 30.7(L) 31.0(L)  Platelets 150 - 400 K/uL 336 558(H) 675(H)     CMP Latest Ref Rng & Units 01/18/2021 12/21/2020 12/18/2020  Glucose 70 - 99 mg/dL 105(H) 93 -  BUN 8 - 23 mg/dL 18 16 -  Creatinine 0.61 - 1.24 mg/dL 0.81 0.71 0.59(L)  Sodium 135 - 145 mmol/L 137 131(L) -  Potassium 3.5 - 5.1 mmol/L 3.8 3.9 -  Chloride 98 - 111 mmol/L 102 98 -  CO2 22 - 32 mmol/L 25 27 -  Calcium 8.9 - 10.3 mg/dL 9.7 9.2 -  Total Protein 6.5 - 8.1 g/dL 8.0 6.1(L) -  Total Bilirubin 0.3 - 1.2 mg/dL 0.5 0.4 -  Alkaline Phos 38 - 126 U/L 101 110 -  AST 15 - 41 U/L 17 44(H) -  ALT 0 - 44 U/L 20 88(H) -      RADIOGRAPHIC STUDIES: I have personally reviewed the radiological images as listed and agreed with the findings in the report. No results found.    Orders Placed This Encounter  Procedures   CEA (IN HOUSE-[facility])    Standing Status:   Standing    Number of Occurrences:   5    Standing Expiration Date:   01/18/2022   Comprehensive metabolic panel    Standing Status:   Standing    Number of Occurrences:   50    Standing Expiration Date:   01/18/2022   CBC with Differential/Platelet    Standing Status:   Standing    Number of Occurrences:   50    Standing Expiration Date:   01/18/2022   Ferritin    Standing Status:   Standing    Number of Occurrences:   20    Standing Expiration Date:   01/18/2022   All questions were answered. The patient knows to call the clinic with any problems, questions or concerns. No barriers to learning was detected. The total time spent in the appointment was 30 minutes.     Truitt Merle, MD 01/18/2021   I, Wilburn Mylar, am acting as scribe for Truitt Merle, MD.   I have  reviewed the above documentation for accuracy and completeness, and I agree with the above.

## 2021-01-18 NOTE — Telephone Encounter (Signed)
Inbound call from patient requesting a call back for when the procedure at [facility] can be scheduled

## 2021-01-19 ENCOUNTER — Encounter: Payer: 59 | Admitting: Internal Medicine

## 2021-01-19 ENCOUNTER — Other Ambulatory Visit: Payer: Self-pay

## 2021-01-19 DIAGNOSIS — R634 Abnormal weight loss: Secondary | ICD-10-CM

## 2021-01-19 DIAGNOSIS — R194 Change in bowel habit: Secondary | ICD-10-CM

## 2021-01-19 DIAGNOSIS — R1013 Epigastric pain: Secondary | ICD-10-CM

## 2021-01-19 DIAGNOSIS — C20 Malignant neoplasm of rectum: Secondary | ICD-10-CM

## 2021-01-19 NOTE — Telephone Encounter (Signed)
Please see notes Below Pt calling in requesting date for when the procedure can be done at [facility]. There is a Opening Jan 10th at the Hospital Please advise if you would like for me to schedule for this date.

## 2021-01-19 NOTE — Telephone Encounter (Signed)
Yes, please schedule for 1/10.

## 2021-01-19 NOTE — Telephone Encounter (Signed)
Pt scheduled for an EGD/COLON on 03/16/2021 @ 11:00 @ [facility] hospital with Dr. Carlean Purl. Case Number  [id number]  Pt Made aware Ambulatory GI referral placed in Epic Letter created for Updated Prep and instructions sent to pt via [place] and Via Mail Pt wife Manuela Schwartz Made aware and verbalized understanding with all questions answered.   Manuela Schwartz states that if there is any way of having these procedures done earlier that would be great. States that her husband has been dealing with the abdominal pain, Weight loss, Poor Appetite and decreased energy since August. Manuela Schwartz was informed that unfortunately the availability in the hospital is limited.

## 2021-01-20 ENCOUNTER — Inpatient Hospital Stay (HOSPITAL_COMMUNITY): Payer: 59

## 2021-01-20 ENCOUNTER — Telehealth: Payer: Self-pay | Admitting: Internal Medicine

## 2021-01-20 ENCOUNTER — Emergency Department (HOSPITAL_COMMUNITY): Payer: 59

## 2021-01-20 ENCOUNTER — Other Ambulatory Visit: Payer: Self-pay

## 2021-01-20 ENCOUNTER — Inpatient Hospital Stay (HOSPITAL_COMMUNITY)
Admission: EM | Admit: 2021-01-20 | Discharge: 2021-02-03 | DRG: 389 | Disposition: A | Discharge status: Home / Self Care | Payer: 59 | Attending: Surgery | Admitting: Surgery

## 2021-01-20 ENCOUNTER — Encounter (HOSPITAL_COMMUNITY): Payer: Self-pay

## 2021-01-20 DIAGNOSIS — Z20822 Contact with and (suspected) exposure to covid-19: Secondary | ICD-10-CM | POA: Diagnosis present

## 2021-01-20 DIAGNOSIS — Q2112 Patent foramen ovale: Secondary | ICD-10-CM

## 2021-01-20 DIAGNOSIS — Z8719 Personal history of other diseases of the digestive system: Secondary | ICD-10-CM | POA: Diagnosis not present

## 2021-01-20 DIAGNOSIS — K56609 Unspecified intestinal obstruction, unspecified as to partial versus complete obstruction: Secondary | ICD-10-CM | POA: Diagnosis not present

## 2021-01-20 DIAGNOSIS — E43 Unspecified severe protein-calorie malnutrition: Secondary | ICD-10-CM

## 2021-01-20 DIAGNOSIS — K5651 Intestinal adhesions [bands], with partial obstruction: Principal | ICD-10-CM | POA: Diagnosis present

## 2021-01-20 DIAGNOSIS — R7989 Other specified abnormal findings of blood chemistry: Secondary | ICD-10-CM | POA: Diagnosis not present

## 2021-01-20 DIAGNOSIS — Z85048 Personal history of other malignant neoplasm of rectum, rectosigmoid junction, and anus: Secondary | ICD-10-CM | POA: Diagnosis not present

## 2021-01-20 DIAGNOSIS — I10 Essential (primary) hypertension: Secondary | ICD-10-CM | POA: Diagnosis present

## 2021-01-20 DIAGNOSIS — R066 Hiccough: Secondary | ICD-10-CM | POA: Diagnosis not present

## 2021-01-20 DIAGNOSIS — K6389 Other specified diseases of intestine: Secondary | ICD-10-CM | POA: Diagnosis not present

## 2021-01-20 DIAGNOSIS — Z923 Personal history of irradiation: Secondary | ICD-10-CM

## 2021-01-20 DIAGNOSIS — Z800 Family history of malignant neoplasm of digestive organs: Secondary | ICD-10-CM

## 2021-01-20 DIAGNOSIS — Z9049 Acquired absence of other specified parts of digestive tract: Secondary | ICD-10-CM | POA: Diagnosis not present

## 2021-01-20 DIAGNOSIS — K5669 Other partial intestinal obstruction: Secondary | ICD-10-CM | POA: Diagnosis not present

## 2021-01-20 DIAGNOSIS — Z4682 Encounter for fitting and adjustment of non-vascular catheter: Secondary | ICD-10-CM | POA: Diagnosis not present

## 2021-01-20 DIAGNOSIS — Z9221 Personal history of antineoplastic chemotherapy: Secondary | ICD-10-CM

## 2021-01-20 DIAGNOSIS — R109 Unspecified abdominal pain: Secondary | ICD-10-CM | POA: Diagnosis not present

## 2021-01-20 DIAGNOSIS — K913 Postprocedural intestinal obstruction, unspecified as to partial versus complete: Secondary | ICD-10-CM | POA: Diagnosis present

## 2021-01-20 DIAGNOSIS — F17290 Nicotine dependence, other tobacco product, uncomplicated: Secondary | ICD-10-CM | POA: Diagnosis present

## 2021-01-20 DIAGNOSIS — Z978 Presence of other specified devices: Secondary | ICD-10-CM

## 2021-01-20 DIAGNOSIS — Z4659 Encounter for fitting and adjustment of other gastrointestinal appliance and device: Secondary | ICD-10-CM

## 2021-01-20 DIAGNOSIS — M1990 Unspecified osteoarthritis, unspecified site: Secondary | ICD-10-CM | POA: Diagnosis present

## 2021-01-20 DIAGNOSIS — Z86718 Personal history of other venous thrombosis and embolism: Secondary | ICD-10-CM | POA: Diagnosis not present

## 2021-01-20 DIAGNOSIS — Z0189 Encounter for other specified special examinations: Secondary | ICD-10-CM

## 2021-01-20 LAB — URINALYSIS, ROUTINE W REFLEX MICROSCOPIC
Bilirubin Urine: NEGATIVE
Glucose, UA: NEGATIVE mg/dL
Hgb urine dipstick: NEGATIVE
Ketones, ur: 5 mg/dL — AB
Leukocytes,Ua: NEGATIVE
Nitrite: NEGATIVE
Protein, ur: 100 mg/dL — AB
Specific Gravity, Urine: <1.005 — ABNORMAL LOW (ref 1.005–1.030)
pH: 5 (ref 5.0–8.0)

## 2021-01-20 LAB — COMPREHENSIVE METABOLIC PANEL
ALT: 22 U/L (ref 0–44)
AST: 20 U/L (ref 15–41)
Albumin: 4.2 g/dL (ref 3.5–5.0)
Alkaline Phosphatase: 105 U/L (ref 38–126)
Anion gap: 11 (ref 5–15)
BUN: 25 mg/dL — ABNORMAL HIGH (ref 8–23)
CO2: 28 mmol/L (ref 22–32)
Calcium: 10.2 mg/dL (ref 8.9–10.3)
Chloride: 98 mmol/L (ref 98–111)
Creatinine, Ser: 0.73 mg/dL (ref 0.61–1.24)
GFR, Estimated: >60 mL/min (ref >60)
Glucose, Bld: 192 mg/dL — ABNORMAL HIGH (ref 70–99)
Potassium: 3.8 mmol/L (ref 3.5–5.1)
Sodium: 137 mmol/L (ref 135–145)
Total Bilirubin: 0.7 mg/dL (ref 0.3–1.2)
Total Protein: 8.3 g/dL — ABNORMAL HIGH (ref 6.5–8.1)

## 2021-01-20 LAB — LIPASE, BLOOD: Lipase: 29 U/L (ref 11–51)

## 2021-01-20 LAB — CBC WITH DIFFERENTIAL/PLATELET
Abs Immature Granulocytes: 0.07 10*3/uL (ref 0.00–0.07)
Basophils Absolute: 0 10*3/uL (ref 0.0–0.1)
Basophils Relative: 0 %
Eosinophils Absolute: 0 10*3/uL (ref 0.0–0.5)
Eosinophils Relative: 0 %
HCT: 41.6 % (ref 39.0–52.0)
Hemoglobin: 13.7 g/dL (ref 13.0–17.0)
Immature Granulocytes: 0 %
Lymphocytes Relative: 3 %
Lymphs Abs: 0.5 10*3/uL — ABNORMAL LOW (ref 0.7–4.0)
MCH: 29 pg (ref 26.0–34.0)
MCHC: 32.9 g/dL (ref 30.0–36.0)
MCV: 88.1 fL (ref 80.0–100.0)
Monocytes Absolute: 0.7 10*3/uL (ref 0.1–1.0)
Monocytes Relative: 4 %
Neutro Abs: 15.5 10*3/uL — ABNORMAL HIGH (ref 1.7–7.7)
Neutrophils Relative %: 93 %
Platelets: 326 10*3/uL (ref 150–400)
RBC: 4.72 MIL/uL (ref 4.22–5.81)
RDW: 14 % (ref 11.5–15.5)
WBC: 16.8 10*3/uL — ABNORMAL HIGH (ref 4.0–10.5)
nRBC: 0 % (ref 0.0–0.2)

## 2021-01-20 LAB — RESP PANEL BY RT-PCR (FLU A&B, COVID) ARPGX2
Influenza A by PCR: NEGATIVE
Influenza B by PCR: NEGATIVE
SARS Coronavirus 2 by RT PCR: NEGATIVE

## 2021-01-20 LAB — LACTIC ACID, PLASMA
Lactic Acid, Venous: 2.8 mmol/L (ref 0.5–1.9)
Lactic Acid, Venous: 2.4 mmol/L (ref 0.5–1.9)

## 2021-01-20 LAB — PREALBUMIN: Prealbumin: 18.1 mg/dL (ref 18–38)

## 2021-01-20 MED ORDER — ENOXAPARIN SODIUM 40 MG/0.4ML IJ SOSY
40.0000 mg | PREFILLED_SYRINGE | INTRAMUSCULAR | Status: DC
  Administered 2021-01-20 – 2021-02-02 (×14): 40 mg via SUBCUTANEOUS
  Filled 2021-01-20 (×14): qty 0.4

## 2021-01-20 MED ORDER — ORAL CARE MOUTH RINSE
15.0000 mL | Freq: Two times a day (BID) | OROMUCOSAL | Status: DC
  Administered 2021-01-21 – 2021-02-02 (×25): 15 mL via OROMUCOSAL

## 2021-01-20 MED ORDER — TIMOLOL MALEATE 0.5 % OP SOLN
1.0000 [drp] | Freq: Every morning | OPHTHALMIC | Status: DC
  Administered 2021-01-21 – 2021-02-03 (×14): 1 [drp] via OPHTHALMIC
  Filled 2021-01-20: qty 5

## 2021-01-20 MED ORDER — HYDRALAZINE HCL 20 MG/ML IJ SOLN
10.0000 mg | INTRAMUSCULAR | Status: DC | PRN
  Administered 2021-01-21: 18:00:00 10 mg via INTRAVENOUS
  Filled 2021-01-20: qty 1

## 2021-01-20 MED ORDER — ACETAMINOPHEN 325 MG PO TABS
650.0000 mg | ORAL_TABLET | Freq: Four times a day (QID) | ORAL | Status: DC | PRN
  Administered 2021-01-31: 10:00:00 650 mg via ORAL
  Filled 2021-01-20: qty 2

## 2021-01-20 MED ORDER — ONDANSETRON HCL 4 MG/2ML IJ SOLN
4.0000 mg | Freq: Once | INTRAMUSCULAR | Status: AC
  Administered 2021-01-20: 4 mg via INTRAVENOUS
  Filled 2021-01-20: qty 2

## 2021-01-20 MED ORDER — DIATRIZOATE MEGLUMINE & SODIUM 66-10 % PO SOLN
90.0000 mL | Freq: Once | ORAL | Status: DC
  Filled 2021-01-20 (×2): qty 90

## 2021-01-20 MED ORDER — SODIUM CHLORIDE (PF) 0.9 % IJ SOLN
INTRAMUSCULAR | Status: AC
  Filled 2021-01-20: qty 50

## 2021-01-20 MED ORDER — METHOCARBAMOL 1000 MG/10ML IJ SOLN
500.0000 mg | Freq: Four times a day (QID) | INTRAVENOUS | Status: DC | PRN
  Filled 2021-01-20: qty 5

## 2021-01-20 MED ORDER — SODIUM CHLORIDE 0.9 % IV SOLN: INTRAVENOUS | Status: AC

## 2021-01-20 MED ORDER — CHLORHEXIDINE GLUCONATE 0.12 % MT SOLN
15.0000 mL | Freq: Two times a day (BID) | OROMUCOSAL | Status: DC
  Administered 2021-01-20 – 2021-02-03 (×27): 15 mL via OROMUCOSAL
  Filled 2021-01-20 (×25): qty 15

## 2021-01-20 MED ORDER — ACETAMINOPHEN 650 MG RE SUPP: 650.0000 mg | Freq: Four times a day (QID) | RECTAL | Status: DC | PRN

## 2021-01-20 MED ORDER — ONDANSETRON HCL 4 MG/2ML IJ SOLN
4.0000 mg | Freq: Four times a day (QID) | INTRAMUSCULAR | Status: DC | PRN
  Administered 2021-01-20 – 2021-01-26 (×6): 4 mg via INTRAVENOUS
  Filled 2021-01-20 (×8): qty 2

## 2021-01-20 MED ORDER — IOHEXOL 350 MG/ML SOLN
80.0000 mL | Freq: Once | INTRAVENOUS | Status: AC | PRN
  Administered 2021-01-20: 80 mL via INTRAVENOUS

## 2021-01-20 MED ORDER — DIPHENHYDRAMINE HCL 12.5 MG/5ML PO ELIX: 12.5000 mg | ORAL_SOLUTION | Freq: Four times a day (QID) | ORAL | Status: DC | PRN

## 2021-01-20 MED ORDER — SODIUM CHLORIDE 0.9 % IV BOLUS
1000.0000 mL | Freq: Once | INTRAVENOUS | Status: AC
  Administered 2021-01-20: 1000 mL via INTRAVENOUS

## 2021-01-20 MED ORDER — HYDROMORPHONE HCL 1 MG/ML IJ SOLN
0.5000 mg | Freq: Once | INTRAMUSCULAR | Status: AC
  Administered 2021-01-20: 0.5 mg via INTRAVENOUS
  Filled 2021-01-20: qty 1

## 2021-01-20 MED ORDER — DIPHENHYDRAMINE HCL 50 MG/ML IJ SOLN: 12.5000 mg | Freq: Four times a day (QID) | INTRAMUSCULAR | Status: DC | PRN

## 2021-01-20 MED ORDER — ONDANSETRON 4 MG PO TBDP
4.0000 mg | ORAL_TABLET | Freq: Four times a day (QID) | ORAL | Status: DC | PRN
  Administered 2021-01-30 – 2021-02-01 (×4): 4 mg via ORAL
  Filled 2021-01-20 (×4): qty 1

## 2021-01-20 MED ORDER — MORPHINE SULFATE (PF) 2 MG/ML IV SOLN
2.0000 mg | INTRAVENOUS | Status: DC | PRN
  Administered 2021-01-20: 2 mg via INTRAVENOUS
  Administered 2021-01-20 (×2): 4 mg via INTRAVENOUS
  Administered 2021-01-21 (×3): 2 mg via INTRAVENOUS
  Filled 2021-01-20: qty 1
  Filled 2021-01-20 (×2): qty 2
  Filled 2021-01-20 (×3): qty 1

## 2021-01-20 MED ORDER — ONDANSETRON HCL 4 MG/2ML IJ SOLN
4.0000 mg | Freq: Once | INTRAMUSCULAR | Status: AC
  Administered 2021-01-20: 4 mg via INTRAVENOUS
  Filled 2021-01-20 (×2): qty 2

## 2021-01-20 NOTE — ED Notes (Signed)
First pass of NG tube to right nostril unsuccessful.  Great deal of resistance met.

## 2021-01-20 NOTE — ED Provider Notes (Signed)
[facility] DEPT Provider Note   CSN: [id number] Arrival date & time: 01/20/21  0645     History Chief Complaint  Patient presents with   Abdominal Pain   Emesis    Juan David is a 65 y.o. male.  Patient with history of rectal adenocarcinoma (2018, treated with chemo and radiation, lost to follow-up), small bowel perforation status post small bowel resection 12/04/20 and extended hospitalization due to intra-abdominal abscess requiring drain placement --presents to the emergency department for vomiting and worsening abdominal pain.  Patient has had decreased appetite and weight loss that is continued since his recent hospitalization.  He has been able to tolerate fluids and foods until 2 days ago when he developed persistent vomiting after eating.  He also has worsening pain in his mid abdomen.  He has a healing surgical wound that his wife, who is a Marine scientist, has been caring for her.  The wound has been improving without any signs of infection or discharge.  This is not changed.  Symptoms were severe this morning, unrelieved with home oxycodone that he uses as needed.  Encouraged to come to the ED for evaluation.  He has infrequent soft bowel movements without blood.  Vomiting nonbloody, none bilious.  No urinary symptoms. The onset of this condition was acute. The course is constant.        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   Hypertension    Iron deficiency    Legally blind in left eye, as defined in [place]    per pt only see light   PFO (patent foramen ovale)    per TEE done 05-11-2009    Rectal  adenocarcinoma ([place]) 09/28/2016   Traumatic glaucoma, 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 at [facility] in [place]   08-18-2001   Wears glasses     Patient Active Problem List   Diagnosis Date Noted   History of exploratory laparotomy 12/21/2020   Protein-calorie malnutrition, severe 12/21/2020   Small bowel perforation ([place]) 12/04/2020   Rectal adenocarcinoma ([place]) 09/28/2016    Past Surgical History:  Procedure Laterality Date   ARTHROSCOPIC REPAIR ACL Right 1998   EXPLORATION AND REPAIR LEFT EYE INJURY  08-18-2001   [place]   ruptured globe- repair corneal laceration, reposition of prolapsed uveal tissue   HEMORRHOID SURGERY N/A 09/01/2016   Procedure: HEMORRHOIDECTOMY;  Surgeon: Leighton Ruff, MD;  Location: [facility];  Service: General;  Laterality: N/A;   LAPAROTOMY N/A 12/04/2020   Procedure: EXPLORATORY LAPAROTOMY small bowel resection;  Surgeon: Dwan Bolt, MD;  Location: [facility];  Service: General;  Laterality: N/A;   SUPERFICIAL KERATECTOMY Left 06-29-2011    [facility]    with EDTA scrub of left eye   TEE WITHOUT CARDIOVERSION  05-11-2009  dr David Carnes   LVSEF 55-65%/  mild thickened AV without AI/  trace MR/ mixed fixed artherosclerosis plaqueing thoracic aorta/  no evidence thrombus/  by agitated saling and color doppler there was a PFO  Family History  Problem Relation Age of Onset   Colon cancer Mother     Social History   Tobacco Use   Smoking status: Every Day    Types: Cigars   Smokeless tobacco: Former    Quit date: 08/25/2008   Tobacco comments:    per pt currently smoke small cigar x2 daily (average) previous smoked cigeratte until 2010  Vaping Use   Vaping Use: Never used  Substance Use Topics   Alcohol use: Yes    Comment: occasionally   Drug use: Not Currently    Types: Marijuana    Home Medications Prior to Admission medications   Medication Sig Start Date End Date Taking? Authorizing Provider   dicyclomine (BENTYL) 20 MG tablet Take 1 tablet (20 mg total) by mouth 3 (three) times daily as needed for spasms (before meals). 12/01/20   Gatha Mayer, MD  hydrochlorothiazide (HYDRODIURIL) 50 MG tablet Take 1/2 of a tablet by mouth every 24 hours for hypertension. Patient taking differently: Take 25 mg by mouth daily. 11/04/20     omeprazole (PRILOSEC) 40 MG capsule Take 40 mg by mouth daily.    [provider]  simethicone (MYLICON) 80 MG chewable tablet Chew 1 tablet (80 mg total) by mouth 4 (four) times daily as needed for flatulence. 12/21/20   Winferd Humphrey, PA-C  Sodium Chloride Flush (NORMAL SALINE FLUSH) 0.9 % SOLN Flush abscess drain once daily with 5-10 mls 12/21/20 01/20/21  Jacqualine Mau, NP  timolol (TIMOPTIC) 0.5 % ophthalmic solution Place 1 drop into the left eye every morning.  11/19/14   [provider]    Allergies    Patient has no known allergies.  Review of Systems   Review of Systems  Constitutional:  Negative for fever.  HENT:  Negative for rhinorrhea and sore throat.   Eyes:  Negative for redness.  Respiratory:  Negative for cough.   Cardiovascular:  Negative for chest pain.  Gastrointestinal:  Positive for abdominal pain, nausea and vomiting. Negative for blood in stool and diarrhea.  Genitourinary:  Negative for dysuria and hematuria.  Musculoskeletal:  Negative for myalgias.  Skin:  Negative for rash.  Neurological:  Negative for headaches.   Physical Exam Updated Vital Signs BP (!) 136/91 (BP Location: Left Arm)   Pulse 82   Resp 17   Ht 5\' 11"  (1.803 m)   Wt 49 kg   SpO2 100%   BMI 15.06 kg/m   Physical Exam Vitals and nursing note reviewed.  Constitutional:      General: He is not in acute distress.    Appearance: He is well-developed.  HENT:     Head: Normocephalic and atraumatic.  Eyes:     General:        Right eye: No discharge.        Left eye: No discharge.     Conjunctiva/sclera: Conjunctivae  normal.  Cardiovascular:     Rate and Rhythm: Normal rate and regular rhythm.     Heart sounds: Normal heart sounds.  Pulmonary:     Effort: Pulmonary effort is normal.     Breath sounds: Normal breath sounds.  Abdominal:     Palpations: Abdomen is soft.     Tenderness: There is abdominal tenderness (moderate tenderness without rebound or guarding) in the periumbilical area.     Comments: Mostly healed midline surgical wound across the abdomen.  There is a small, proximately 2 mm open wound without drainage or surrounding cellulitis.  Musculoskeletal:     Cervical back: Normal range of motion and neck supple.  Skin:    General: Skin is warm and dry.  Neurological:     Mental Status: He is alert.    ED Results / Procedures / Treatments   Labs (all labs ordered are listed, but only abnormal results are displayed) Labs Reviewed  CBC WITH DIFFERENTIAL/PLATELET - Abnormal; Notable for the following components:      Result Value   WBC 16.8 (*)    Neutro Abs 15.5 (*)    Lymphs Abs 0.5 (*)    All other components within normal limits  COMPREHENSIVE METABOLIC PANEL - Abnormal; Notable for the following components:   Glucose, Bld 192 (*)    BUN 25 (*)    Total Protein 8.3 (*)    All other components within normal limits  LACTIC ACID, PLASMA - Abnormal; Notable for the following components:   Lactic Acid, Venous 2.8 (*)    All other components within normal limits  RESP PANEL BY RT-PCR (FLU A&B, COVID) ARPGX2  LIPASE, BLOOD  URINALYSIS, ROUTINE W REFLEX MICROSCOPIC  LACTIC ACID, PLASMA    EKG None  Radiology CT Abdomen Pelvis W Contrast  Result Date: 01/20/2021 CLINICAL DATA:  Bowel obstruction suspected, severe right-sided abdominal pain, nausea, and vomiting, status post recent small-bowel resection, history of rectal cancer and bowel perforation EXAM: CT ABDOMEN AND PELVIS WITH CONTRAST TECHNIQUE: Multidetector CT imaging of the abdomen and pelvis was performed using the  standard protocol following bolus administration of intravenous contrast. CONTRAST:  6mL OMNIPAQUE IOHEXOL 350 MG/ML SOLN COMPARISON:  12/16/2020 FINDINGS: Lower chest: No acute abnormality. Unchanged 0.5 cm nodule of the right lower lobe (series 6, image 7). Hepatobiliary: No solid liver abnormality is seen. No gallstones, gallbladder wall thickening, or biliary dilatation. Pancreas: Unremarkable. No pancreatic ductal dilatation or surrounding inflammatory changes. Spleen: Normal in size without significant abnormality. Adrenals/Urinary Tract: Adrenal glands are unremarkable. Kidneys are normal, without renal calculi, solid lesion, or hydronephrosis. Bladder is unremarkable. Stomach/Bowel: Stomach is within normal limits. The mid to distal small bowel is diffusely gas and fluid distended, with multiple fecalized loops in the low abdomen and pelvis. Loops measure up to 3.7 cm in caliber. There is a fecalized loop with an abrupt caliber change in the low midline abdomen (series 2, image 64, series 4, image 42) with complete decompression of approximately the distal 30 cm of ileum. There is scattered gas and stool present throughout the colon to the rectum. Redemonstrated distal small bowel resection and reanastomosis in the low pelvis (series 2, image 66). Previously seen percutaneous drainage catheter in the right hemipelvis is removed. There is no significant residual fluid collection in this vicinity. Sigmoid diverticulosis. Vascular/Lymphatic: Aortic atherosclerosis. No enlarged abdominal or pelvic lymph nodes. Reproductive: No mass or other significant abnormality. Other: No abdominal wall hernia or abnormality. New small volume ascites throughout the abdomen and pelvis. Musculoskeletal: No acute or significant osseous findings. IMPRESSION: 1. The mid to distal small bowel is diffusely gas and fluid distended, with multiple fecalized loops in the low abdomen and pelvis. Loops measure up to 3.7 cm in caliber.  There is a fecalized loop with an abrupt caliber change in the low midline abdomen with complete decompression of approximately the distal 30 cm of ileum. Findings are consistent with small bowel obstruction, likely secondary to adhesion. 2. Previously seen percutaneous drainage catheter in the right hemipelvis is removed. There is no significant residual fluid collection in this vicinity. 3.  Redemonstrated distal small bowel resection and reanastomosis in the low pelvis. 4. New small volume ascites throughout the abdomen and pelvis. Aortic Atherosclerosis (ICD10-I70.0). Electronically Signed   By: Delanna Ahmadi M.D.   On: 01/20/2021 10:30    Procedures Procedures   Medications Ordered in ED Medications  sodium chloride (PF) 0.9 % injection (has no administration in time range)  ondansetron (ZOFRAN) injection 4 mg (has no administration in time range)  HYDROmorphone (DILAUDID) injection 0.5 mg (has no administration in time range)  HYDROmorphone (DILAUDID) injection 0.5 mg (0.5 mg Intravenous Given 01/20/21 0833)  ondansetron (ZOFRAN) injection 4 mg (4 mg Intravenous Given 01/20/21 0832)  sodium chloride 0.9 % bolus 1,000 mL (0 mLs Intravenous Stopped 01/20/21 1051)  iohexol (OMNIPAQUE) 350 MG/ML injection 80 mL (80 mLs Intravenous Contrast Given 01/20/21 1003)    ED Course  I have reviewed the triage vital signs and the nursing notes.  Pertinent labs & imaging results that were available during my care of the patient were reviewed by me and considered in my medical decision making (see chart for details).  Patient seen and examined. Work-up initiated. Medications ordered.  We will proceed with labs and repeat imaging.  Vital signs reviewed and are as follows: BP (!) 136/91 (BP Location: Left Arm)   Pulse 82   Resp 17   Ht 5\' 11"  (1.803 m)   Wt 49 kg   SpO2 100%   BMI 15.06 kg/m   8:49 AM Lactic 2.8. Fluids were ordered earlier. No 300cc/kg bolus 2/2 no hypotension and lactate < 4.  Awaiting CT.   10:58 AM CT demonstrates small bowel obstruction, interval improvement of intra-abdominal abscess.  Patient continues to be very nauseous with movement but has not had any further vomiting.  Patient and wife updated on results.  Discussed case with general surgery and requested consult.  Advise NG tube, ordered.  COVID pending.   MDM Rules/Calculators/A&P                           Admit.  Low concern for sepsis.   Final Clinical Impression(s) / ED Diagnoses Final diagnoses:  Small bowel obstruction [facility])    Rx / DC Orders ED Discharge Orders     None        Carlisle Cater, PA-C 01/20/21 1457    Daleen Bo, MD 01/23/21 1301

## 2021-01-20 NOTE — Telephone Encounter (Signed)
Just FYI Please see note below

## 2021-01-20 NOTE — ED Triage Notes (Signed)
Pt reports with severe abdominal pain and vomiting x 2 days. Pt has been unable to keep anything down. Wife states that he had surgery in October to remove a portion of his intestines.

## 2021-01-20 NOTE — Progress Notes (Addendum)
Attempted NG tube placement in both right and left nares. Unable to advance NG tube past the nose in the right nostril but was able to advance past the nose in the left nare but could not get it into the stomach. Order placed for placement under image guidance.   Obie Dredge, PA-C [facility] Please see Amion for pager number during day hours 7:00am-4:30pm

## 2021-01-20 NOTE — Progress Notes (Signed)
  This patient was seen by me prior to his presentation with perforated small bowel.  I had been working with Dr. Zenia Resides and the plan was to get EGD and colonoscopy completed on the patient sometime soon.  He has a history of a difficult airway at last surgery so procedure needed to be done at the hospital in January was the earliest available.   I am asking my colleagues covering the hospital to come see him and we can work with surgery, if there is a way for him to have his EGD and/or colonoscopy while he is an inpatient I think that would be useful to try to help [place] understand if there is an underlying process not yet uncovered in him.  Thanks.  Gatha Mayer, MD, [facility] 01/20/2021 5:29 PM

## 2021-01-20 NOTE — Telephone Encounter (Signed)
Noted.  Will have inpatient GI team follow-up with him.

## 2021-01-20 NOTE — Telephone Encounter (Signed)
Patient's wife called to inform that patient was admitted into [facility] yesterday.

## 2021-01-20 NOTE — ED Notes (Signed)
Pt is unable to tolerate bair hugger states it is making him nauseous.  Will attempt to reapply.

## 2021-01-20 NOTE — ED Notes (Signed)
Took gastrografin to pt who states he cannot tolerate orally at this time. Awaiting NG tube placement.

## 2021-01-20 NOTE — ED Notes (Signed)
Attempted left nares NG placement with no success-unable to advance past resistance-primary RN will notify surgery

## 2021-01-20 NOTE — ED Notes (Signed)
Second attempt by Marzetta Board, RN to place NG to left nostril unsuccessful.  Will inform general surgery.

## 2021-01-20 NOTE — H&P (Signed)
Juan David 01-18-1956  [id number].    Requesting MD: Dr. Christ Kick, Alecia Lemming, PA-C Chief Complaint/Reason for Consult: SBO   HPI: Juan ABRAHA is a 65 y.o. male who presented to the ED with abdominal pain, n/v. Patient was recently admitted from 9/30 - 10/17 after undergoing ex lap with SBR for focal ischemic perforation by Dr. Zenia Resides on 12/04/20. Post operatively, patient developed ileus that did require TPN. On POD 7 he underwent CT that showed IAA for which he underwent IR drain placement on 10/6. Cx grew E. Coli and Enterococcus for which he was tx with appropriate abx. His ileus resolved and TPN was weaned off.   He underwent repeat CT scan prior to discharge which revealed significantly improved fluid collection and his drain was able to be removed prior to discharge. It appears he is being scheduled for outpatient colonoscopy with GI per chart review.   Patient states he was slowly getting better at home, appetite seemed to be overall improving, until 2 days ago when he developed decreased appetite and distention. Last night at 10 PM he developed significant sharp, non-radiating abdominal pain and profuse vomiting. No flatus or BM since yesterday. In the ED, he was afebrile (T 96.4), without tachycardia or hypotension, WBC 16.8, Lactic 2.8, CT A/P w/ multiple fecalized loops of small bowel with an abrupt caliber change in the low midline abdomen with complete decompression of approximately the distal 30 cm of ileum concerning for SBO. No residual fluid collection at prior perc drain site. We were asked to see.  Past Medical Hx: HTN, DVT (not listed on anticoagulation) and remote hx of T2 rectal cancer excised via transanal biopsy in 2018 and for which he was treated with chemoradiation and declined to undergo APR. He followed up with Dr. Luis Abed on 01/19/21  ROS: Review of Systems  Constitutional:  Negative for chills and fever.  Gastrointestinal:  Positive for abdominal pain,  constipation, nausea and vomiting.  All other systems reviewed and are negative.  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   Hypertension    Iron deficiency    Legally blind in left eye, as defined in [place]    per pt only see light   PFO (patent foramen ovale)    per TEE done 05-11-2009    Rectal adenocarcinoma ([place]) 09/28/2016   Traumatic glaucoma, 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 at [facility] in [place]   08-18-2001   Wears glasses     Past Surgical History:  Procedure Laterality Date   ARTHROSCOPIC REPAIR ACL Right [place] LEFT EYE INJURY  08-18-2001   [place]   ruptured globe- repair corneal laceration, reposition of prolapsed uveal tissue   HEMORRHOID SURGERY N/A 09/01/2016   Procedure: HEMORRHOIDECTOMY;  Surgeon: Leighton Ruff, MD;  [facility]: [facility];  Service: General;  Laterality: N/A;   LAPAROTOMY N/A 12/04/2020  Procedure: EXPLORATORY LAPAROTOMY small bowel resection;  Surgeon: Dwan Bolt, MD;  Location: [facility];  Service: General;  Laterality: N/A;   SUPERFICIAL KERATECTOMY Left 06-29-2011    [facility]    with EDTA scrub of left eye   TEE WITHOUT CARDIOVERSION  05-11-2009  dr Dorris Carnes   LVSEF 55-65%/  mild thickened AV without AI/  trace MR/ mixed fixed artherosclerosis plaqueing thoracic aorta/  no evidence thrombus/  by agitated saling and color doppler there was a PFO    Social History:  reports that he has been smoking cigars. He quit smokeless tobacco use about 12 years ago. He reports  current alcohol use. He reports that he does not currently use drugs after having used the following drugs: Marijuana.  Allergies: No Known Allergies  (Not in a hospital admission)    Physical Exam: Blood pressure 134/88, pulse 88, temperature (!) 96.4 F (35.8 C), temperature source Rectal, resp. rate (!) 21, height 5\' 11"  (1.803 m), weight 49 kg, SpO2 99 %. General: pleasant, ill-appearing male who is sitting up, vomiting. HEENT: head is normocephalic, atraumatic.  Sclera are noninjected.  PERRL.  Ears and nose without any masses or lesions.  Mouth is pink and moist. Dentition fair Heart: regular, rate, and rhythm.  Normal s1,s2. No obvious murmurs, gallops, or rubs noted.  Palpable pedal pulses bilaterally  Lungs: CTAB, no wheezes, rhonchi, or rales noted.  Respiratory effort nonlabored Abd:  Soft, distended, mild TTP right hemiabdomen without guarding, midline abdomen with well-healing midline incision, pinpoint area of granulation tissue inferior most aspect of incision,  hypoactive bowel sounds, no hernias. MS: no BUE/BLE edema, calves soft and nontender Skin: warm and dry with no masses, lesions, or rashes Psych: A&Ox4 with an appropriate affect Neuro: cranial nerves grossly intact, equal strength in BUE/BLE bilaterally, normal speech, thought process intact, moves all extremities, gait not assessed   Results for orders placed or performed during the hospital encounter of 01/20/21 (from the past 48 hour(s))  CBC with Differential     Status: Abnormal   Collection Time: 01/20/21  8:04 AM  Result Value Ref Range   WBC 16.8 (H) 4.0 - 10.5 K/uL   RBC 4.72 4.22 - 5.81 MIL/uL   Hemoglobin 13.7 13.0 - 17.0 g/dL   HCT 41.6 39.0 - 52.0 %   MCV 88.1 80.0 - 100.0 fL   MCH 29.0 26.0 - 34.0 pg   MCHC 32.9 30.0 - 36.0 g/dL   RDW 14.0 11.5 - 15.5 %   Platelets 326 150 - 400 K/uL   nRBC 0.0 0.0 - 0.2 %   Neutrophils Relative % 93 %   Neutro Abs 15.5 (H) 1.7 - 7.7 K/uL   Lymphocytes  Relative 3 %   Lymphs Abs 0.5 (L) 0.7 - 4.0 K/uL   Monocytes Relative 4 %   Monocytes Absolute 0.7 0.1 - 1.0 K/uL   Eosinophils Relative 0 %   Eosinophils Absolute 0.0 0.0 - 0.5 K/uL   Basophils Relative 0 %   Basophils Absolute 0.0 0.0 - 0.1 K/uL   Immature Granulocytes 0 %   Abs Immature Granulocytes 0.07 0.00 - 0.07 K/uL    Comment: Performed at [facility], [place] [address], [place], [place] [address]  Comprehensive metabolic panel     Status: Abnormal   Collection Time: 01/20/21  8:04 AM  Result Value Ref Range   Sodium 137 135 - 145 mmol/L   Potassium 3.8 3.5 - 5.1 mmol/L   Chloride 98  98 - 111 mmol/L   CO2 28 22 - 32 mmol/L   Glucose, Bld 192 (H) 70 - 99 mg/dL    Comment: Glucose reference range applies only to samples taken after fasting for at least 8 hours.   BUN 25 (H) 8 - 23 mg/dL   Creatinine, Ser 0.73 0.61 - 1.24 mg/dL   Calcium 10.2 8.9 - 10.3 mg/dL   Total Protein 8.3 (H) 6.5 - 8.1 g/dL   Albumin 4.2 3.5 - 5.0 g/dL   AST 20 15 - 41 U/L   ALT 22 0 - 44 U/L   Alkaline Phosphatase 105 38 - 126 U/L   Total Bilirubin 0.7 0.3 - 1.2 mg/dL   GFR, Estimated >60 >60 mL/min    Comment: (NOTE) Calculated using the CKD-EPI Creatinine Equation (2021)    Anion gap 11 5 - 15    Comment: Performed at [facility], [place] [address], [place], [place] [address]  Lipase, blood     Status: None   Collection Time: 01/20/21  8:04 AM  Result Value Ref Range   Lipase 29 11 - 51 U/L    Comment: Performed at [facility], [place] [address], [place], [place] [address]  Lactic acid, plasma     Status: Abnormal   Collection Time: 01/20/21  8:04 AM  Result Value Ref Range   Lactic Acid, Venous 2.8 (HH) 0.5 - 1.9 mmol/L    Comment: CRITICAL RESULT CALLED TO, READ BACK BY AND VERIFIED WITH: BIENFANG, A. RN ON 01/20/2021 @ 0847 BY MECIAL J. Performed at [facility], [place] [address], [place], [place] [address]    CT  Abdomen Pelvis W Contrast  Result Date: 01/20/2021 CLINICAL DATA:  Bowel obstruction suspected, severe right-sided abdominal pain, nausea, and vomiting, status post recent small-bowel resection, history of rectal cancer and bowel perforation EXAM: CT ABDOMEN AND PELVIS WITH CONTRAST TECHNIQUE: Multidetector CT imaging of the abdomen and pelvis was performed using the standard protocol following bolus administration of intravenous contrast. CONTRAST:  44mL OMNIPAQUE IOHEXOL 350 MG/ML SOLN COMPARISON:  12/16/2020 FINDINGS: Lower chest: No acute abnormality. Unchanged 0.5 cm nodule of the right lower lobe (series 6, image 7). Hepatobiliary: No solid liver abnormality is seen. No gallstones, gallbladder wall thickening, or biliary dilatation. Pancreas: Unremarkable. No pancreatic ductal dilatation or surrounding inflammatory changes. Spleen: Normal in size without significant abnormality. Adrenals/Urinary Tract: Adrenal glands are unremarkable. Kidneys are normal, without renal calculi, solid lesion, or hydronephrosis. Bladder is unremarkable. Stomach/Bowel: Stomach is within normal limits. The mid to distal small bowel is diffusely gas and fluid distended, with multiple fecalized loops in the low abdomen and pelvis. Loops measure up to 3.7 cm in caliber. There is a fecalized loop with an abrupt caliber change in the low midline abdomen (series 2, image 64, series 4, image 42) with complete decompression of approximately the distal 30 cm of ileum. There is scattered gas and stool present throughout the colon to the rectum. Redemonstrated distal small bowel resection and reanastomosis in the low pelvis (series 2, image 66). Previously seen percutaneous drainage catheter in the right hemipelvis is removed. There is no significant residual fluid collection in this vicinity. Sigmoid diverticulosis. Vascular/Lymphatic: Aortic atherosclerosis. No enlarged abdominal or pelvic lymph nodes. Reproductive: No mass or other  significant abnormality. Other: No abdominal wall hernia or abnormality. New small volume ascites throughout the abdomen and pelvis. Musculoskeletal: No acute or significant osseous findings. IMPRESSION: 1. The mid to distal small bowel is  diffusely gas and fluid distended, with multiple fecalized loops in the low abdomen and pelvis. Loops measure up to 3.7 cm in caliber. There is a fecalized loop with an abrupt caliber change in the low midline abdomen with complete decompression of approximately the distal 30 cm of ileum. Findings are consistent with small bowel obstruction, likely secondary to adhesion. 2. Previously seen percutaneous drainage catheter in the right hemipelvis is removed. There is no significant residual fluid collection in this vicinity. 3. Redemonstrated distal small bowel resection and reanastomosis in the low pelvis. 4. New small volume ascites throughout the abdomen and pelvis. Aortic Atherosclerosis (ICD10-I70.0). Electronically Signed   By: Delanna Ahmadi M.D.   On: 01/20/2021 10:30    Anti-infectives (From admission, onward)    None       Assessment/Plan SBO Hx ex lap with SBR for focal ischemic perforation by Dr. Zenia Resides on 12/04/20  - No current indication for emergency surgery - Place NGT for decompression and keep NPO - Start SBO protocol - Keep K > 4 and Mg > 2 for bowel function - Mobilize for bowel function - Hopefully patient will improve with non-operative management. If patient fails to improve with NG decompression and small bowel protocol, he may require exploratory surgery during admission - Admit to inpatient  FEN - NPO, NGT, IVF VTE - SCDs, Lovenox ID - None currently Foley - None  Juan David, [facility] 01/20/2021, 11:04 AM Please see Amion for pager number during day hours 7:00am-4:30pm

## 2021-01-21 ENCOUNTER — Inpatient Hospital Stay (HOSPITAL_COMMUNITY): Payer: 59

## 2021-01-21 ENCOUNTER — Inpatient Hospital Stay: Payer: Self-pay

## 2021-01-21 DIAGNOSIS — K56609 Unspecified intestinal obstruction, unspecified as to partial versus complete obstruction: Secondary | ICD-10-CM | POA: Diagnosis not present

## 2021-01-21 DIAGNOSIS — Z4682 Encounter for fitting and adjustment of non-vascular catheter: Secondary | ICD-10-CM | POA: Diagnosis not present

## 2021-01-21 LAB — CBC
HCT: 39.9 % (ref 39.0–52.0)
Hemoglobin: 13.1 g/dL (ref 13.0–17.0)
MCH: 29 pg (ref 26.0–34.0)
MCHC: 32.8 g/dL (ref 30.0–36.0)
MCV: 88.5 fL (ref 80.0–100.0)
Platelets: 242 10*3/uL (ref 150–400)
RBC: 4.51 MIL/uL (ref 4.22–5.81)
RDW: 14.3 % (ref 11.5–15.5)
WBC: 7.5 10*3/uL (ref 4.0–10.5)
nRBC: 0 % (ref 0.0–0.2)

## 2021-01-21 LAB — BASIC METABOLIC PANEL
Anion gap: 11 (ref 5–15)
BUN: 26 mg/dL — ABNORMAL HIGH (ref 8–23)
CO2: 24 mmol/L (ref 22–32)
Calcium: 9.6 mg/dL (ref 8.9–10.3)
Chloride: 104 mmol/L (ref 98–111)
Creatinine, Ser: 0.67 mg/dL (ref 0.61–1.24)
GFR, Estimated: >60 mL/min (ref >60)
Glucose, Bld: 111 mg/dL — ABNORMAL HIGH (ref 70–99)
Potassium: 3.7 mmol/L (ref 3.5–5.1)
Sodium: 139 mmol/L (ref 135–145)

## 2021-01-21 LAB — MAGNESIUM: Magnesium: 1.7 mg/dL (ref 1.7–2.4)

## 2021-01-21 LAB — PHOSPHORUS: Phosphorus: 3.7 mg/dL (ref 2.5–4.6)

## 2021-01-21 MED ORDER — CHLORHEXIDINE GLUCONATE CLOTH 2 % EX PADS
6.0000 | MEDICATED_PAD | Freq: Every day | CUTANEOUS | Status: DC
  Administered 2021-01-22 – 2021-02-03 (×13): 6 via TOPICAL

## 2021-01-21 MED ORDER — PANTOPRAZOLE SODIUM 40 MG IV SOLR
40.0000 mg | INTRAVENOUS | Status: DC
  Administered 2021-01-21 – 2021-01-29 (×9): 40 mg via INTRAVENOUS
  Filled 2021-01-21 (×9): qty 40

## 2021-01-21 MED ORDER — SODIUM CHLORIDE 0.9% FLUSH
10.0000 mL | INTRAVENOUS | Status: DC | PRN
  Administered 2021-01-23 – 2021-01-29 (×2): 10 mL

## 2021-01-21 MED ORDER — MORPHINE SULFATE (PF) 2 MG/ML IV SOLN
2.0000 mg | INTRAVENOUS | Status: DC | PRN
  Administered 2021-01-22 (×2): 2 mg via INTRAVENOUS
  Administered 2021-01-23: 4 mg via INTRAVENOUS
  Administered 2021-01-24 – 2021-02-01 (×21): 2 mg via INTRAVENOUS
  Filled 2021-01-21: qty 2
  Filled 2021-01-21 (×23): qty 1

## 2021-01-21 MED ORDER — POTASSIUM CHLORIDE 10 MEQ/100ML IV SOLN
10.0000 meq | INTRAVENOUS | Status: AC
  Administered 2021-01-21 (×3): 10 meq via INTRAVENOUS
  Filled 2021-01-21: qty 100

## 2021-01-21 MED ORDER — MAGNESIUM SULFATE 4 GM/100ML IV SOLN
4.0000 g | Freq: Once | INTRAVENOUS | Status: AC
  Administered 2021-01-21: 18:00:00 4 g via INTRAVENOUS
  Filled 2021-01-21: qty 100

## 2021-01-21 NOTE — Progress Notes (Signed)
IVT consulted for difficult PIV placement.  Pt not in room upon arrival; RN to place new consult when patient is back in room and ready for IV placement assessment.

## 2021-01-21 NOTE — Consult Note (Signed)
Referring Provider: Dr. Carlean Purl  Primary Care Physician:  Juan Downing, MD Primary Gastroenterologist:  Dr. Carlean Purl  Reason for Consultation: Consideration for EGD and colonoscopy during hospital admission   HPI: Juan David is a 65 y.o. 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 S/P exploratory laparotomy with small bowel resection for an ischemic perforation 12/04/2020 which resulted in an prolonged hospital admission due to an intra-abdominal abscess requiring a drain placement.  His clinical status improved and he was discharged home on 12/21/2020.  He was readmitted to the hospital 01/20/2021 due to having decreased appetite with the development of severe central abdominal pain with projectile vomiting.  Emesis was nonbloody.  CTAP with contrast identified a small bowel obstruction likely due to adhesions. He was evaluated by general surgery and an NGT was ordered for decompression but  was unsuccessfully placed after multiple attempts. He went down to IR for NGT placement under fluoroscopy which was also unsuccessful.  He remains NPO.  He may potentially require expiratory surgery during this admission if his bowel obstruction worsens with unsuccessful decompression.  He continues to have central abdominal pain, rated his pain a 10 out of 10 earlier this morning which was significantly improved after he received IV pain medication.  No nausea or vomiting.  His last bowel movement was 2 days ago.  No rectal bleeding or black-colored stools.  He had left chest pain briefly after he received his IV pain medication which resolved.  No current chest pain, palpitations or shortness of breath.  His wife is at the bedside.  He was initially seen in our outpatient GI clinic by Dr. Carlean Purl 12/01/2020 for further evaluation regarding epigastric pain with  infrequent vomiting and diarrhea.  He reported having intermittent black stools he was placed on PPI. During this visit, he reported having a history of rectal cancer, at surgery he was found to have an anal canal mass that was biopsied and then subsequently treated with radiation and chemotherapy.  He had been followed by Juan David and Juan David but has not been seen in oncology since 2019.  He had a colonoscopy by Dr. Penelope Coop before he retired in 2021, apparently he had a polyp the pathology report was not located in epic or Care Everywhere.  An EGD and colonoscopy were scheduled January 2023 for further evaluation.  Dr. Carlean Purl requested a GI consult to consider an EGD and colonoscopy during this hospital admission when medically appropriate  CTAP with contrast 01/20/2021: 1. The mid to distal small bowel is diffusely gas and fluid distended, with multiple fecalized loops in the low abdomen and pelvis. Loops measure up to 3.7 cm in caliber. There is a fecalized loop with an abrupt caliber change in the low midline abdomen with complete decompression of approximately the distal 30 cm of ileum. Findings are consistent with small bowel obstruction, likely secondary to adhesion. 2. Previously seen percutaneous drainage catheter in the right hemipelvis is removed. There is no significant residual fluid collection in this vicinity. 3. Redemonstrated distal small bowel resection and reanastomosis in the low pelvis. 4. New small volume ascites throughout the abdomen and pelvis.  Aortic Atherosclerosis   Pelvic CT without contrast 12/21/2020: Decreased size of the right pelvic abscess measuring 1.8 x 1.2 cm, previously 4.1 x 1.6 cm, though note abscess margins are difficult to visualize without intravenous contrast (per report the  patient's IV infiltrated). Unchanged position of right posterior percutaneous drain, correlate with drain output  CTAP with contrast 12/16/2020: 1. Interval placement of  a posterior approach percutaneous drain catheter in the right hemipelvis within a fluid collection, which is diminished in size, dominant component in the right pelvis measuring 4.1 x 1.6 cm, previously 8.2 x 3.7 cm. 2. Additional fluid collection posterior to the cecum measuring 1.5 x 1.2 cm, likewise diminished in size, previously 4.2 x 1.6 cm. 3. Redemonstrated mid to distal small bowel resection and anastomosis in the posterior pelvis. 4. Diffusely thickened, inflamed appearing colon, possibly reactive to adjacent fluid collections. 5. Status post midline laparotomy.  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   Traumatic glaucoma, 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 at [facility] in [place]   08-18-2001   Wears glasses     Past Surgical History:  Procedure Laterality Date   ARTHROSCOPIC REPAIR ACL Right Thomas LEFT EYE INJURY  08-18-2001   [place]   ruptured globe- repair corneal laceration, reposition of prolapsed uveal tissue   HEMORRHOID SURGERY N/A 09/01/2016   Procedure: HEMORRHOIDECTOMY;  Surgeon: Leighton Ruff, MD;  Location: [facility];  Service: General;  Laterality: N/A;   LAPAROTOMY N/A 12/04/2020   Procedure: EXPLORATORY LAPAROTOMY small bowel resection;  Surgeon: Dwan Bolt, MD;  Location: [facility];  Service: General;  Laterality: N/A;    SUPERFICIAL KERATECTOMY Left 06-29-2011    [facility]    with EDTA scrub of left eye   TEE WITHOUT CARDIOVERSION  05-11-2009  dr Dorris Carnes   LVSEF 55-65%/  mild thickened AV without AI/  trace MR/ mixed fixed artherosclerosis plaqueing thoracic aorta/  no evidence thrombus/  by agitated saling and color doppler there was a PFO    Prior to Admission medications   Medication Sig Start Date End Date Taking? Authorizing Provider  dicyclomine (BENTYL) 20 MG tablet Take 1 tablet (20 mg total) by mouth 3 (three) times daily as needed for spasms (before meals). Patient taking differently: Take 20 mg by mouth 3 (three) times daily before meals. 12/01/20  Yes Gatha Mayer, MD  hydrochlorothiazide (HYDRODIURIL) 50 MG tablet Take 1/2 of a tablet by mouth every 24 hours for hypertension. Patient taking differently: Take 25 mg by mouth daily. 11/04/20  Yes   methocarbamol (ROBAXIN) 750 MG tablet Take 750 mg by mouth every 6 (six) hours as needed for muscle spasms. 12/21/20  Yes [provider]  Multiple Vitamin (MULTIVITAMIN WITH MINERALS) TABS tablet Take 1 tablet by mouth daily.   Yes [provider]  omeprazole (PRILOSEC) 40 MG capsule Take 40 mg by mouth daily.   Yes [provider]  oxyCODONE (OXY  IR/ROXICODONE) 5 MG immediate release tablet Take 5 mg by mouth every 6 (six) hours as needed for pain. 12/21/20  Yes [provider]  timolol (TIMOPTIC) 0.5 % ophthalmic solution Place 1 drop into the left eye every morning.  11/19/14  Yes [provider]  simethicone (MYLICON) 80 MG chewable tablet Chew 1 tablet (80 mg total) by mouth 4 (four) times daily as needed for flatulence. Patient not taking: Reported on 01/20/2021 12/21/20   Winferd Humphrey, PA-C    Current Facility-Administered Medications  Medication Dose Route Frequency Provider Last Rate Last Admin   0.9 %  sodium chloride infusion   Intravenous Continuous Jillyn Ledger, PA-C 100 mL/hr at  01/21/21 9518 New Bag at 01/21/21 8416   acetaminophen (TYLENOL) tablet 650 mg  650 mg Oral Q6H PRN Jillyn Ledger, PA-C       Or   acetaminophen (TYLENOL) suppository 650 mg  650 mg Rectal Q6H PRN Jillyn Ledger, PA-C       chlorhexidine (PERIDEX) 0.12 % solution 15 mL  15 mL Mouth Rinse BID Armandina Gemma, MD   15 mL at 01/20/21 2157   diatrizoate meglumine-sodium (GASTROGRAFIN) 66-10 % solution 90 mL  90 mL Per NG tube Once Jillyn Ledger, PA-C       diphenhydrAMINE (BENADRYL) 12.5 MG/5ML elixir 12.5 mg  12.5 mg Oral Q6H PRN Maczis, Barth Kirks, PA-C       Or   diphenhydrAMINE (BENADRYL) injection 12.5 mg  12.5 mg Intravenous Q6H PRN Maczis, Barth Kirks, PA-C       enoxaparin (LOVENOX) injection 40 mg  40 mg Subcutaneous Q24H Jillyn Ledger, PA-C   40 mg at 01/20/21 1903   hydrALAZINE (APRESOLINE) injection 10 mg  10 mg Intravenous Q2H PRN Jillyn Ledger, PA-C       MEDLINE mouth rinse  15 mL Mouth Rinse q12n4p Armandina Gemma, MD       methocarbamol (ROBAXIN) 500 mg in dextrose 5 % 50 mL IVPB  500 mg Intravenous Q6H PRN Maczis, Barth Kirks, PA-C       morphine 2 MG/ML injection 2-4 mg  2-4 mg Intravenous Q3H PRN Jillyn Ledger, PA-C   2 mg at 01/21/21 6063   ondansetron (ZOFRAN-ODT) disintegrating tablet 4 mg  4 mg Oral Q6H PRN Jillyn Ledger, PA-C       Or   ondansetron [facility]) injection 4 mg  4 mg Intravenous Q6H PRN Jillyn Ledger, PA-C   4 mg at 01/20/21 1659   timolol (TIMOPTIC) 0.5 % ophthalmic solution 1 drop  1 drop Left Eye q morning Armandina Gemma, MD        Allergies as of 01/20/2021   (No Known Allergies)    Family History  Problem Relation Age of Onset   Colon cancer Mother     Social History   Socioeconomic History   Marital status: Married    Spouse name: Not on file   Number of children: Not on file   Years of education: Not on file   Highest education level: Not on file  Occupational History   Not on file  Tobacco Use   Smoking status: Every  Day    Types: Cigars   Smokeless tobacco: Former    Quit date: 08/25/2008   Tobacco comments:    per pt currently smoke small cigar x2 daily (average) previous smoked cigeratte until 2010  Vaping Use   Vaping Use: Never used  Substance and Sexual Activity  Alcohol use: Yes    Comment: occasionally   Drug use: Not Currently    Types: Marijuana   Sexual activity: Not on file  Other Topics Concern   Not on file  Social History Narrative   The patient is married no children   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  CV: Denies chest pain, palpitations or edema. Resp: Denies cough, shortness of breath of hemoptysis.  GI: See HPI.   GU : Denies urinary burning, blood in urine, increased urinary frequency or incontinence. MS: Denies joint pain, muscles aches or weakness. Derm: Denies rash, itchiness, skin lesions or unhealing ulcers. Psych: Denies depression, anxiety memory loss. Heme: Denies easy bruising, bleeding. Neuro:  Denies headaches, dizziness or paresthesias. Endo:  Denies any problems with DM, thyroid or adrenal function.  Physical Exam: Vital signs in last 24 hours: Temp:  [98 F (36.7 C)-98.7 F (37.1 C)] 98.1 F (36.7 C) (11/17 0618) Pulse Rate:  [66-89] 66 (11/17 0618) Resp:  [17-21] 18 (11/17 0618) BP: (123-156)/(81-95) 154/91 (11/17 0618) SpO2:  [96 %-100 %] 99 % (11/17 0618) Last BM Date: 01/18/21 General:  Alert 65 year old male in no acute distress. Head:  Normocephalic and atraumatic. Eyes:  No scleral  icterus. Conjunctiva pink. Ears:  Normal auditory acuity. Nose:  No deformity, discharge or lesions. Mouth:  Dentition intact. No ulcers or lesions.  Neck:  Supple. No lymphadenopathy or thyromegaly.  Lungs: Breath sounds clear throughout. Heart: Regular rate and rhythm, no murmurs. Abdomen: Soft, nondistended.  Hypoactive gurgling bowel sounds auscultated.  Mild tenderness above the umbilicus without rebound or guarding.  No mass.  Midline abdominal scar intact with dry dressing. Rectal: Deferred. Musculoskeletal:  Symmetrical without gross deformities.  Pulses:  Normal pulses noted. Extremities:  Without clubbing or edema. Neurologic:  Alert and  oriented x4. No focal deficits.  Skin:  Intact without significant lesions or rashes. Psych:  Alert and cooperative. Normal mood and affect.  Intake/Output from previous day: 11/16 0701 - 11/17 0700 In: 2042.7 [P.O.:60; I.V.:982.7; IV Piggyback:1000] Out: 300 [Urine:300] Intake/Output this shift: Total I/O In: -  Out: 200 [Urine:200]  Lab Results: Recent Labs    01/18/21 1342 01/20/21 0804 01/21/21 0413  WBC 6.0 16.8* 7.5  HGB 12.3* 13.7 13.1  HCT 38.4* 41.6 39.9  PLT 336 326 242   BMET Recent Labs    01/18/21 1342 01/20/21 0804 01/21/21 0413  NA 137 137 139  K 3.8 3.8 3.7  CL 102 98 104  CO2 25 28 24   GLUCOSE 105* 192* 111*  BUN 18 25* 26*  CREATININE 0.81 0.73 0.67  CALCIUM 9.7 10.2 9.6   LFT Recent Labs    01/20/21 0804  PROT 8.3*  ALBUMIN 4.2  AST 20  ALT 22  ALKPHOS 105  BILITOT 0.7   PT/INR No results for input(s): LABPROT, INR in the last 72 hours. Hepatitis Panel No results for input(s): HEPBSAG, HCVAB, HEPAIGM, HEPBIGM in the last 72 hours.    Studies/Results: CT Abdomen Pelvis W Contrast  Result Date: 01/20/2021 CLINICAL DATA:  Bowel obstruction suspected, severe right-sided abdominal pain, nausea, and vomiting, status post recent small-bowel resection, history of rectal cancer and bowel  perforation EXAM: CT ABDOMEN AND PELVIS WITH CONTRAST TECHNIQUE: Multidetector CT imaging of the abdomen and pelvis was performed using the standard protocol following bolus administration of intravenous contrast. CONTRAST:  39mL OMNIPAQUE IOHEXOL 350 MG/ML SOLN COMPARISON:  12/16/2020 FINDINGS: Lower chest: No acute abnormality. Unchanged 0.5 cm nodule of the right lower lobe (series 6, image 7). Hepatobiliary: No solid liver abnormality is seen. No gallstones, gallbladder wall thickening, or biliary dilatation. Pancreas: Unremarkable. No pancreatic ductal dilatation or surrounding inflammatory changes. Spleen: Normal in size without significant abnormality. Adrenals/Urinary Tract: Adrenal glands are unremarkable. Kidneys are normal, without renal calculi, solid lesion, or hydronephrosis. Bladder is unremarkable. Stomach/Bowel: Stomach is within normal limits. The mid to distal small bowel is diffusely gas and fluid distended, with multiple fecalized loops in the low abdomen and pelvis. Loops measure up to 3.7 cm in caliber. There is a fecalized loop with an abrupt caliber change in the low midline abdomen (series 2, image 64, series 4, image 42) with complete decompression of approximately the distal 30 cm of ileum. There is scattered gas and stool present throughout the colon to the rectum. Redemonstrated distal small bowel resection and reanastomosis in the low pelvis (series 2, image 66). Previously seen percutaneous drainage catheter in the right hemipelvis is removed. There is no significant residual fluid collection in this vicinity. Sigmoid diverticulosis. Vascular/Lymphatic: Aortic atherosclerosis. No enlarged abdominal or pelvic lymph nodes. Reproductive: No mass or other significant abnormality. Other: No abdominal wall hernia or abnormality. New small volume ascites throughout the abdomen and pelvis. Musculoskeletal: No acute or significant osseous findings. IMPRESSION: 1. The mid to distal small bowel  is diffusely gas and fluid distended, with multiple fecalized loops in the low abdomen and pelvis. Loops measure up to 3.7 cm in caliber. There is a fecalized loop with an abrupt caliber change in the low midline abdomen with complete decompression of approximately the distal 30 cm of ileum. Findings are consistent with small bowel obstruction, likely secondary to adhesion. 2. Previously seen percutaneous drainage catheter in the right hemipelvis is removed. There is no significant residual fluid collection in this vicinity. 3. Redemonstrated distal small bowel resection and reanastomosis in the low pelvis. 4. New small volume ascites throughout the abdomen and pelvis. Aortic Atherosclerosis (ICD10-I70.0). Electronically Signed   By: Delanna Ahmadi M.D.   On: 01/20/2021 10:30    IMPRESSION/PLAN:  40) 65 year old male with a history of anal canal cancer s/p chemo and radiation 2018 previously admitted to the hospital due to having a perforated small bowel s/p exploratory laparotomy with small bowel resection for an ischemic perforation 12/04/2020 which resulted in an prolonged hospital admission due to an intra-abdominal abscess requiring a drain placement. He was eventually discharged home on 12/21/2020.  He was evaluated by Dr. Carlean Purl prior to this hospital admission as noted above with plans for an EGD and colonoscopy scheduled January 2023. He developed acute central abdominal pain with projectile vomiting and was readmitted to the hospital 01/20/2021.  CTAP identified a small bowel obstruction likely due to adhesions.  He was seen by general surgery and NG tube for decompression, however, multiple attempts to place the NG tube were unsuccessful.  He went down to IR today for fluoroscopy assisted NG tube placement which was also unsuccessful.  He remains hemodynamically stable. -NPO -IV fluids and pain  management per the hospitalist -No plans for EGD and colonoscopy in the setting of a small bowel  obstruction with unsuccessful NG tube placement for decompression -Unsuccessful NG tube placement under fluoroscopy by IR today, may require endoscopic placement of NG tube, await further recommendations per Dr. Hilarie Fredrickson -No plans for surgical intervention today per general surgery -Pantoprazole 40mg  IV Q 24 hrs -Encourage ambulation   2) History of chronic anemia.  Hemoglobin 13.1.  -Eventual EGD and colonoscopy as noted above     Noralyn Pick  01/21/2021, 11:25AM

## 2021-01-21 NOTE — Progress Notes (Signed)
Subjective: No acute changes. Unable to place NG yesterday on multiple attempts. No bowel movements, still having nausea and pain.   Objective: Vital signs in last 24 hours: Temp:  [98 F (36.7 C)-98.7 F (37.1 C)] 98.1 F (36.7 C) (11/17 0618) Pulse Rate:  [66-89] 66 (11/17 0618) Resp:  [17-21] 18 (11/17 0618) BP: (123-156)/(81-95) 154/91 (11/17 0618) SpO2:  [96 %-100 %] 99 % (11/17 0618) Last BM Date: 01/18/21  Intake/Output from previous day: 11/16 0701 - 11/17 0700 In: 2042.7 [P.O.:60; I.V.:982.7; IV Piggyback:1000] Out: 300 [Urine:300] Intake/Output this shift: Total I/O In: -  Out: 200 [Urine:200]  PE: General: resting comfortably, NAD Neuro: alert and oriented, no focal deficits Resp: normal work of breathing Abdomen: mildly distended and tender to palpation Extremities: warm and well-perfused   Lab Results:  Recent Labs    01/20/21 0804 01/21/21 0413  WBC 16.8* 7.5  HGB 13.7 13.1  HCT 41.6 39.9  PLT 326 242   BMET Recent Labs    01/20/21 0804 01/21/21 0413  NA 137 139  K 3.8 3.7  CL 98 104  CO2 28 24  GLUCOSE 192* 111*  BUN 25* 26*  CREATININE 0.73 0.67  CALCIUM 10.2 9.6   PT/INR No results for input(s): LABPROT, INR in the last 72 hours. CMP     Component Value Date/Time   NA 139 01/21/2021 0413   NA 139 12/27/2016 1237   K 3.7 01/21/2021 0413   K 4.2 12/27/2016 1237   CL 104 01/21/2021 0413   CO2 24 01/21/2021 0413   CO2 25 12/27/2016 1237   GLUCOSE 111 (H) 01/21/2021 0413   GLUCOSE 112 12/27/2016 1237   BUN 26 (H) 01/21/2021 0413   BUN 15.2 12/27/2016 1237   CREATININE 0.67 01/21/2021 0413   CREATININE 1.1 12/27/2016 1237   CALCIUM 9.6 01/21/2021 0413   CALCIUM 9.7 12/27/2016 1237   PROT 8.3 (H) 01/20/2021 0804   PROT 7.6 12/27/2016 1237   ALBUMIN 4.2 01/20/2021 0804   ALBUMIN 4.1 12/27/2016 1237   AST 20 01/20/2021 0804   AST 20 12/27/2016 1237   ALT 22 01/20/2021 0804   ALT 18 12/27/2016 1237   ALKPHOS 105  01/20/2021 0804   ALKPHOS 80 12/27/2016 1237   BILITOT 0.7 01/20/2021 0804   BILITOT 0.45 12/27/2016 1237   GFRNONAA >60 01/21/2021 0413   GFRAA >60 11/20/2017 0948   Lipase     Component Value Date/Time   LIPASE 29 01/20/2021 0804       Studies/Results: CT Abdomen Pelvis W Contrast  Result Date: 01/20/2021 CLINICAL DATA:  Bowel obstruction suspected, severe right-sided abdominal pain, nausea, and vomiting, status post recent small-bowel resection, history of rectal cancer and bowel perforation EXAM: CT ABDOMEN AND PELVIS WITH CONTRAST TECHNIQUE: Multidetector CT imaging of the abdomen and pelvis was performed using the standard protocol following bolus administration of intravenous contrast. CONTRAST:  22mL OMNIPAQUE IOHEXOL 350 MG/ML SOLN COMPARISON:  12/16/2020 FINDINGS: Lower chest: No acute abnormality. Unchanged 0.5 cm nodule of the right lower lobe (series 6, image 7). Hepatobiliary: No solid liver abnormality is seen. No gallstones, gallbladder wall thickening, or biliary dilatation. Pancreas: Unremarkable. No pancreatic ductal dilatation or surrounding inflammatory changes. Spleen: Normal in size without significant abnormality. Adrenals/Urinary Tract: Adrenal glands are unremarkable. Kidneys are normal, without renal calculi, solid lesion, or hydronephrosis. Bladder is unremarkable. Stomach/Bowel: Stomach is within normal limits. The mid to distal small bowel is diffusely gas and fluid distended, with multiple fecalized  loops in the low abdomen and pelvis. Loops measure up to 3.7 cm in caliber. There is a fecalized loop with an abrupt caliber change in the low midline abdomen (series 2, image 64, series 4, image 42) with complete decompression of approximately the distal 30 cm of ileum. There is scattered gas and stool present throughout the colon to the rectum. Redemonstrated distal small bowel resection and reanastomosis in the low pelvis (series 2, image 66). Previously seen  percutaneous drainage catheter in the right hemipelvis is removed. There is no significant residual fluid collection in this vicinity. Sigmoid diverticulosis. Vascular/Lymphatic: Aortic atherosclerosis. No enlarged abdominal or pelvic lymph nodes. Reproductive: No mass or other significant abnormality. Other: No abdominal wall hernia or abnormality. New small volume ascites throughout the abdomen and pelvis. Musculoskeletal: No acute or significant osseous findings. IMPRESSION: 1. The mid to distal small bowel is diffusely gas and fluid distended, with multiple fecalized loops in the low abdomen and pelvis. Loops measure up to 3.7 cm in caliber. There is a fecalized loop with an abrupt caliber change in the low midline abdomen with complete decompression of approximately the distal 30 cm of ileum. Findings are consistent with small bowel obstruction, likely secondary to adhesion. 2. Previously seen percutaneous drainage catheter in the right hemipelvis is removed. There is no significant residual fluid collection in this vicinity. 3. Redemonstrated distal small bowel resection and reanastomosis in the low pelvis. 4. New small volume ascites throughout the abdomen and pelvis. Aortic Atherosclerosis (ICD10-I70.0). Electronically Signed   By: Delanna Ahmadi M.D.   On: 01/20/2021 10:30    Anti-infectives: Anti-infectives (From admission, onward)    None        Assessment/Plan 65 yo male approximately 6 weeks s/p exploratory laparotomy with small bowel resection for an ischemic perforation, now presenting with nausea/vomiting secondary to an SBO, likely adhesive. NG placement has not been successful on multiple attempts, so placement has been requested by radiology under fluoro. Remain NPO with IV fluid hydration. Patient is stable without peritoneal signs on exam, no indication for acute surgical intervention today. We will continue to follow.    LOS: 1 day    Michaelle Birks, MD [facility]  Surgery General, Hepatobiliary and Pancreatic Surgery 01/21/21 8:01 AM

## 2021-01-21 NOTE — Progress Notes (Addendum)
Initial Nutrition Assessment  DOCUMENTATION CODES:  Severe malnutrition in context of acute illness/injury, Underweight  INTERVENTION:  Obtain updated measured weight.  Recommend starting TPN per Pharmacy.  Monitor magnesium, potassium, and phosphorus BID for at least 3 days, MD to replete as needed, as pt is at risk for refeeding syndrome.  NUTRITION DIAGNOSIS:  Severe Malnutrition related to acute illness (ischemic perforation and now with SBO, likely adhesive) as evidenced by percent weight loss, severe fat depletion, severe muscle depletion.  GOAL:  Patient will meet greater than or equal to 90% of their needs  MONITOR:  I & O's, Labs, Weight trends, Diet advancement  REASON FOR ASSESSMENT:  Malnutrition Screening Tool    ASSESSMENT:  65 yo male with a PMH of HTN, DVT (not listed on anticoagulation) and remote hx of T2 rectal cancer excised via transanal biopsy in 2018 and for which he was treated with chemoradiation and declined to undergo APR who presents with SBO.  Unable to place NG yesterday on multiple attempts, so placement has been requested by radiology under fluoro, per Surgery note.  Pt recently admitted to [facility] - RD familiar with this patient.  Spoke with pt and wife at bedside. Pt reports that he has had ongoing nausea and vomiting along with constipation for 3 days.  He also reports that he does not think he is absorbing what he eats, as he continues to eat and he is losing weight.  Per Epic, pt has lost ~19 lbs (14.4%) in the past 2 months, which is significant and severe. Pt's weight for this admission appears stated. RD to order measured weight to determine any changes.  Pt on TPN at [facility] during last admission.  RD recommend restarting TPN due to severe malnutrition, significant and severe weight loss, and potential malabsorption. RD to message Surgery to relay recommendation.  Medications: reviewed; NaCl @ 100 ml/hr, Zofran per IV PRN (given once  today)  Labs: reviewed; Glucose 111 (H), BUN 26 (H - trending up)  NUTRITION - FOCUSED PHYSICAL EXAM: Flowsheet Row Most Recent Value  Orbital Region Severe depletion  Upper Arm Region Severe depletion  Thoracic and Lumbar Region Severe depletion  Buccal Region Severe depletion  Temple Region Severe depletion  Clavicle Bone Region Severe depletion  Clavicle and Acromion Bone Region Severe depletion  Scapular Bone Region Severe depletion  Dorsal Hand Severe depletion  Patellar Region Severe depletion  Anterior Thigh Region Severe depletion  Posterior Calf Region Severe depletion  Edema (RD Assessment) None  Hair Reviewed  Eyes Reviewed  Mouth Reviewed  Skin Reviewed  Nails Reviewed   Diet Order:   Diet Order             Diet NPO time specified Except for: Sips with Meds, Ice Chips  Diet effective now                  EDUCATION NEEDS:  Education needs have been addressed  Skin:  Skin Assessment: Reviewed RN Assessment  Last BM:  01/18/21  Height:  Ht Readings from Last 1 Encounters:  01/20/21 5\' 11"  (1.803 m)   Weight:  Wt Readings from Last 1 Encounters:  01/20/21 49 kg   BMI:  Body mass index is 15.06 kg/m.  Estimated Nutritional Needs:  Kcal:  2100-2300 Protein:  85-100 grams Fluid:  >2.1 L  Derrel Nip, RD, LDN (she/her/hers) Clinical Inpatient Dietitian RD Pager/After-Hours/Weekend Pager # in [place]

## 2021-01-21 NOTE — Progress Notes (Signed)
Peripherally Inserted Central Catheter Placement  The IV Nurse has discussed with the patient and/or persons authorized to consent for the patient, the purpose of this procedure and the potential benefits and risks involved with this procedure.  The benefits include less needle sticks, lab draws from the catheter, and the patient may be discharged home with the catheter. Risks include, but not limited to, infection, bleeding, blood clot (thrombus formation), and puncture of an artery; nerve damage and irregular heartbeat and possibility to perform a PICC exchange if needed/ordered by physician.  Alternatives to this procedure were also discussed.  Bard Power PICC patient education guide, fact sheet on infection prevention and patient information card has been provided to patient /or left at bedside.    PICC Placement Documentation  PICC Double Lumen 17/61/60 PICC Right Basilic 38 cm 1 cm (Active)  Indication for Insertion or Continuance of Line Administration of hyperosmolar/irritating solutions (i.e. TPN, Vancomycin, etc.) 01/21/21 2030  Exposed Catheter (cm) 1 cm 01/21/21 2030  Site Assessment Clean;Intact;Dry 01/21/21 2030  Lumen #1 Status Saline locked;Blood return noted 01/21/21 2030  Lumen #2 Status Saline locked;Blood return noted 01/21/21 2030  Dressing Type Transparent;Securing device 01/21/21 2030  Dressing Status Clean;Dry;Intact 01/21/21 2030  Antimicrobial disc in place? Yes 01/21/21 2030  Safety Lock Not Applicable 73/71/06 2694  Line Care Connections checked and tightened 01/21/21 2030  Line Adjustment (NICU/IV Team Only) No 01/21/21 2030  Dressing Intervention New dressing 01/21/21 2030  Dressing Change Due 01/28/21 01/21/21 2030       Rosalio Macadamia Chenice 01/21/2021, 8:56 PM

## 2021-01-21 NOTE — Progress Notes (Signed)
PHARMACY - TOTAL PARENTERAL NUTRITION CONSULT NOTE   Indication: Small bowel obstruction   Patient Measurements: Height: 5\' 11"  (180.3 cm) Weight: 49 kg (108 lb) IBW/kg (Calculated) : 75.3 TPN AdjBW (KG): 49 Body mass index is 15.06 kg/m. Usual Weight:   Assessment: 89 yoM presents with N/V, worsening abdominal pain, found to have SBO.  PMH includes rectal adenocarcinoma, recent small bowel perforation s/p small bowel resection 7/74/14, complicated by abscess, and TPN for ileus.  He has been able to tolerate fluids and foods until 2 days ago when he developed persistent vomiting after eating.  NG tube placed for decompression.  At risk for refeeding.    Glucose / Insulin: monitor CBGs, A1c 5.9% Electrolytes: K 3.7, Mag 1.7, others wnl - Goal K > 4 and Mg > 2 for bowel function Renal: BUN increased to 26, SCr wnl Hepatic: LFTs wnl (11/16) Prealbumin: 18.1 (11/16) Intake / Output; MIVF:  GI Imaging: 11/16 CT: consistent with small bowel obstruction, likely secondary to adhesion. GI Surgeries / Procedures: 12/04/20 small bowel resection  Central access: pending TPN start date: pending  Nutritional Goals: Goal TPN rate  mL/hr provides  g of protein and  kcals per day  RD Assessment:  (01/21/21) Estimated Needs Total Energy Estimated Needs: 2100-2300 Total Protein Estimated Needs: 85-100 grams Total Fluid Estimated Needs: >2.1 L  Current Nutrition:  NPO  Plan:  TPN ordered after the 12:00 pm cut-off time.  Pharmacy to initiate TPN tomorrow.  Magnesium 4g IV x1 KCl 10 mEq IV x3 runs Continue NS @ 100 ml/hr. Monitor TPN labs on Mon/Thurs   Gretta Arab PharmD, BCPS Clinical Pharmacist [facility] main pharmacy [phone] 01/21/2021 2:44 PM

## 2021-01-21 NOTE — Consult Note (Signed)
Reason for Consult:ngt placement Referring Physician: GS  Juan David is an 66 y.o. male.  HPI: pt w SBO and multiple attempts to get NGT in place. He has no previous hx of swallowing. No previous airway surgery.   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   Hypertension    Iron deficiency    Legally blind in left eye, as defined in [place]    per pt only see light   PFO (patent foramen ovale)    per TEE done 05-11-2009    Rectal adenocarcinoma ([place]) 09/28/2016   Traumatic glaucoma, 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 at [facility] in [place]   08-18-2001   Wears glasses     Past Surgical History:  Procedure Laterality Date   ARTHROSCOPIC REPAIR ACL Right [place] LEFT EYE INJURY  08-18-2001   [place]   ruptured globe- repair corneal laceration, reposition of prolapsed uveal tissue   HEMORRHOID SURGERY N/A 09/01/2016   Procedure: HEMORRHOIDECTOMY;  Surgeon: Leighton Ruff, MD;  Location: [place];  Service: General;  Laterality: N/A;   LAPAROTOMY N/A 12/04/2020   Procedure: EXPLORATORY LAPAROTOMY small bowel resection;  Surgeon: Dwan Bolt, MD;  Location: [place];  Service: General;  Laterality: N/A;   SUPERFICIAL KERATECTOMY Left 06-29-2011    [facility]    with EDTA scrub of left eye   TEE WITHOUT CARDIOVERSION  05-11-2009  dr Dorris Carnes   LVSEF 55-65%/  mild thickened AV without AI/  trace MR/ mixed fixed artherosclerosis plaqueing thoracic aorta/  no evidence thrombus/  by agitated saling and color doppler there was a PFO    Family History  Problem Relation Age of Onset   Colon  cancer Mother     Social History:  reports that he has been smoking cigars. He quit smokeless tobacco use about 12 years ago. He reports current alcohol use. He reports that he does not currently use drugs after having used the following drugs: Marijuana.  Allergies: No Known Allergies  Medications: I have reviewed the patient's current medications.  Results for orders placed or performed during the hospital encounter of 01/20/21 (from the past 48 hour(s))  CBC with Differential     Status: Abnormal   Collection Time: 01/20/21  8:04 AM  Result Value Ref Range   WBC 16.8 (H) 4.0 - 10.5 K/uL   RBC 4.72 4.22 - 5.81 MIL/uL   Hemoglobin 13.7 13.0 - 17.0 g/dL   HCT 41.6 39.0 - 52.0 %   MCV 88.1 80.0 - 100.0 fL   MCH 29.0 26.0 - 34.0 pg   MCHC 32.9 30.0 - 36.0 g/dL   RDW 14.0 11.5 - 15.5 %   Platelets 326 150 - 400 K/uL   nRBC 0.0 0.0 - 0.2 %   Neutrophils Relative % 93 %   Neutro Abs 15.5 (H) 1.7 - 7.7 K/uL   Lymphocytes Relative 3 %   Lymphs Abs 0.5 (L) 0.7 - 4.0 K/uL   Monocytes Relative 4 %  Monocytes Absolute 0.7 0.1 - 1.0 K/uL   Eosinophils Relative 0 %   Eosinophils Absolute 0.0 0.0 - 0.5 K/uL   Basophils Relative 0 %   Basophils Absolute 0.0 0.0 - 0.1 K/uL   Immature Granulocytes 0 %   Abs Immature Granulocytes 0.07 0.00 - 0.07 K/uL    Comment: Performed at [facility], [place] [address], [place], [place] [address]  Comprehensive metabolic panel     Status: Abnormal   Collection Time: 01/20/21  8:04 AM  Result Value Ref Range   Sodium 137 135 - 145 mmol/L   Potassium 3.8 3.5 - 5.1 mmol/L   Chloride 98 98 - 111 mmol/L   CO2 28 22 - 32 mmol/L   Glucose, Bld 192 (H) 70 - 99 mg/dL    Comment: Glucose reference range applies only to samples taken after fasting for at least 8 hours.   BUN 25 (H) 8 - 23 mg/dL   Creatinine, Ser 0.73 0.61 - 1.24 mg/dL   Calcium 10.2 8.9 - 10.3 mg/dL   Total Protein 8.3 (H) 6.5 - 8.1 g/dL   Albumin 4.2 3.5 - 5.0 g/dL   AST  20 15 - 41 U/L   ALT 22 0 - 44 U/L   Alkaline Phosphatase 105 38 - 126 U/L   Total Bilirubin 0.7 0.3 - 1.2 mg/dL   GFR, Estimated >60 >60 mL/min    Comment: (NOTE) Calculated using the CKD-EPI Creatinine Equation (2021)    Anion gap 11 5 - 15    Comment: Performed at [facility], [place] [address] [place], [place] [address]  Lipase, blood     Status: None   Collection Time: 01/20/21  8:04 AM  Result Value Ref Range   Lipase 29 11 - 51 U/L    Comment: Performed at [facility], [place] [address], [place], [place] [address]  Lactic acid, plasma     Status: Abnormal   Collection Time: 01/20/21  8:04 AM  Result Value Ref Range   Lactic Acid, Venous 2.8 (HH) 0.5 - 1.9 mmol/L    Comment: CRITICAL RESULT CALLED TO, READ BACK BY AND VERIFIED WITH: BIENFANG, A. RN ON 01/20/2021 @ 0847 BY MECIAL J. Performed at [facility], [place] [address], [place], [place] [address]   Lactic acid, plasma     Status: Abnormal   Collection Time: 01/20/21 10:50 AM  Result Value Ref Range   Lactic Acid, Venous 2.4 (HH) 0.5 - 1.9 mmol/L    Comment: CRITICAL VALUE NOTED.  VALUE IS CONSISTENT WITH PREVIOUSLY REPORTED AND CALLED VALUE. Performed at [facility], [place] [address], [place], [place] [address]   Resp Panel by RT-PCR (Flu A&B, Covid) Nasopharyngeal Swab     Status: None   Collection Time: 01/20/21 10:51 AM   Specimen: Nasopharyngeal Swab; Nasopharyngeal(NP) swabs in vial transport medium  Result Value Ref Range   SARS Coronavirus 2 by RT PCR NEGATIVE NEGATIVE    Comment: (NOTE) SARS-CoV-2 target nucleic acids are NOT DETECTED.  The SARS-CoV-2 RNA is generally detectable in upper respiratory specimens during the acute phase of infection. The lowest concentration of SARS-CoV-2 viral copies this assay can detect is 138 copies/mL. A negative result does not preclude SARS-Cov-2 infection and should not be used as the sole basis for  treatment or other patient management decisions. A negative result may occur with  improper specimen collection/handling, submission of specimen other than nasopharyngeal swab, presence of viral mutation(s) within  the areas targeted by this assay, and inadequate number of viral copies(<138 copies/mL). A negative result must be combined with clinical observations, patient history, and epidemiological information. The expected result is Negative.  Fact Sheet for Patients:  EntrepreneurPulse.com.au  Fact Sheet for Healthcare Providers:  IncredibleEmployment.be  This test is no t yet approved or cleared by the [place] FDA and  has been authorized for detection and/or diagnosis of SARS-CoV-2 by FDA under an Emergency Use Authorization (EUA). This EUA will remain  in effect (meaning this test can be used) for the duration of the COVID-19 declaration under Section 564(b)(1) of the Act, 21 U.S.C.section 360bbb-3(b)(1), unless the authorization is terminated  or revoked sooner.       Influenza A by PCR NEGATIVE NEGATIVE   Influenza B by PCR NEGATIVE NEGATIVE    Comment: (NOTE) The Xpert Xpress SARS-CoV-2/FLU/RSV plus assay is intended as an aid in the diagnosis of influenza from Nasopharyngeal swab specimens and should not be used as a sole basis for treatment. Nasal washings and aspirates are unacceptable for Xpert Xpress SARS-CoV-2/FLU/RSV testing.  Fact Sheet for Patients: EntrepreneurPulse.com.au  Fact Sheet for Healthcare Providers: IncredibleEmployment.be  This test is not yet approved or cleared by the [place] FDA and has been authorized for detection and/or diagnosis of SARS-CoV-2 by FDA under an Emergency Use Authorization (EUA). This EUA will remain in effect (meaning this test can be used) for the duration of the COVID-19 declaration under Section 564(b)(1) of the Act, 21 U.S.C. section  360bbb-3(b)(1), unless the authorization is terminated or revoked.  Performed at [facility], [place] [address], [place], [place] [address]   Urinalysis, Routine w reflex microscopic     Status: Abnormal   Collection Time: 01/20/21 11:57 AM  Result Value Ref Range   Color, Urine YELLOW YELLOW   APPearance CLEAR CLEAR   Specific Gravity, Urine <1.005 (L) 1.005 - 1.030   pH 5.0 5.0 - 8.0   Glucose, UA NEGATIVE NEGATIVE mg/dL   Hgb urine dipstick NEGATIVE NEGATIVE   Bilirubin Urine NEGATIVE NEGATIVE   Ketones, ur 5 (A) NEGATIVE mg/dL   Protein, ur 100 (A) NEGATIVE mg/dL   Nitrite NEGATIVE NEGATIVE   Leukocytes,Ua NEGATIVE NEGATIVE   RBC / HPF 0-5 0 - 5 RBC/hpf   WBC, UA 0-5 0 - 5 WBC/hpf   Bacteria, UA RARE (A) NONE SEEN   Squamous Epithelial / LPF 0-5 0 - 5   Mucus PRESENT     Comment: Performed at [facility], [place] [address] [place], [place] [address]  Prealbumin     Status: None   Collection Time: 01/20/21  6:55 PM  Result Value Ref Range   Prealbumin 18.1 18 - 38 mg/dL    Comment: Performed at [facility], [place] [address] [place], [place] [address]  Basic metabolic panel     Status: Abnormal   Collection Time: 01/21/21  4:13 AM  Result Value Ref Range   Sodium 139 135 - 145 mmol/L   Potassium 3.7 3.5 - 5.1 mmol/L   Chloride 104 98 - 111 mmol/L   CO2 24 22 - 32 mmol/L   Glucose, Bld 111 (H) 70 - 99 mg/dL    Comment: Glucose reference range applies only to samples taken after fasting for at least 8 hours.   BUN 26 (H) 8 - 23 mg/dL   Creatinine, Ser 0.67 0.61 - 1.24 mg/dL   Calcium 9.6 8.9 - 10.3 mg/dL   GFR, Estimated >  60 >60 mL/min    Comment: (NOTE) Calculated using the CKD-EPI Creatinine Equation (2021)    Anion gap 11 5 - 15    Comment: Performed at [facility], [place] [address], [place], [place] [address]  CBC     Status: None   Collection Time: 01/21/21  4:13 AM  Result Value Ref Range   WBC 7.5  4.0 - 10.5 K/uL   RBC 4.51 4.22 - 5.81 MIL/uL   Hemoglobin 13.1 13.0 - 17.0 g/dL   HCT 39.9 39.0 - 52.0 %   MCV 88.5 80.0 - 100.0 fL   MCH 29.0 26.0 - 34.0 pg   MCHC 32.8 30.0 - 36.0 g/dL   RDW 14.3 11.5 - 15.5 %   Platelets 242 150 - 400 K/uL   nRBC 0.0 0.0 - 0.2 %    Comment: Performed at [facility], [place] [address] [place], [place] [address]  Magnesium     Status: None   Collection Time: 01/21/21  4:13 AM  Result Value Ref Range   Magnesium 1.7 1.7 - 2.4 mg/dL    Comment: Performed at [facility], [place] [address], [place], [place] [address]  Phosphorus     Status: None   Collection Time: 01/21/21  4:13 AM  Result Value Ref Range   Phosphorus 3.7 2.5 - 4.6 mg/dL    Comment: Performed at [facility], [place] [address] [place],  [address]    CT Abdomen Pelvis W Contrast  Result Date: 01/20/2021 CLINICAL DATA:  Bowel obstruction suspected, severe right-sided abdominal pain, nausea, and vomiting, status post recent small-bowel resection, history of rectal cancer and bowel perforation EXAM: CT ABDOMEN AND PELVIS WITH CONTRAST TECHNIQUE: Multidetector CT imaging of the abdomen and pelvis was performed using the standard protocol following bolus administration of intravenous contrast. CONTRAST:  29mL OMNIPAQUE IOHEXOL 350 MG/ML SOLN COMPARISON:  12/16/2020 FINDINGS: Lower chest: No acute abnormality. Unchanged 0.5 cm nodule of the right lower lobe (series 6, image 7). Hepatobiliary: No solid liver abnormality is seen. No gallstones, gallbladder wall thickening, or biliary dilatation. Pancreas: Unremarkable. No pancreatic ductal dilatation or surrounding inflammatory changes. Spleen: Normal in size without significant abnormality. Adrenals/Urinary Tract: Adrenal glands are unremarkable. Kidneys are normal, without renal calculi, solid lesion, or hydronephrosis. Bladder is unremarkable. Stomach/Bowel: Stomach is within normal limits.  The mid to distal small bowel is diffusely gas and fluid distended, with multiple fecalized loops in the low abdomen and pelvis. Loops measure up to 3.7 cm in caliber. There is a fecalized loop with an abrupt caliber change in the low midline abdomen (series 2, image 64, series 4, image 42) with complete decompression of approximately the distal 30 cm of ileum. There is scattered gas and stool present throughout the colon to the rectum. Redemonstrated distal small bowel resection and reanastomosis in the low pelvis (series 2, image 66). Previously seen percutaneous drainage catheter in the right hemipelvis is removed. There is no significant residual fluid collection in this vicinity. Sigmoid diverticulosis. Vascular/Lymphatic: Aortic atherosclerosis. No enlarged abdominal or pelvic lymph nodes. Reproductive: No mass or other significant abnormality. Other: No abdominal wall hernia or abnormality. New small volume ascites throughout the abdomen and pelvis. Musculoskeletal: No acute or significant osseous findings. IMPRESSION: 1. The mid to distal small bowel is diffusely gas and fluid distended, with multiple fecalized loops in the low abdomen and pelvis. Loops measure up to 3.7 cm in caliber. There is a fecalized loop with an abrupt  caliber change in the low midline abdomen with complete decompression of approximately the distal 30 cm of ileum. Findings are consistent with small bowel obstruction, likely secondary to adhesion. 2. Previously seen percutaneous drainage catheter in the right hemipelvis is removed. There is no significant residual fluid collection in this vicinity. 3. Redemonstrated distal small bowel resection and reanastomosis in the low pelvis. 4. New small volume ascites throughout the abdomen and pelvis. Aortic Atherosclerosis (ICD10-I70.0). Electronically Signed   By: Delanna Ahmadi M.D.   On: 01/20/2021 10:30   DG [facility] Dys Tube Plc W/Fl W/Rad  Result Date: 01/21/2021 CLINICAL DATA:  Abdominal  pain, bowel obstruction. EXAM: NASO G TUBE PLACEMENT WITH FL AND WITH RAD FLUOROSCOPY TIME:  Fluoroscopy Time:  0 minutes, 30 seconds Radiation Exposure Index (if provided by the fluoroscopic device): 2.1 mGy Number of Acquired Spot Images: 0 COMPARISON:  None. FINDINGS: The patient was cooperative with 14 French nasogastric tube placement. Both nostrils were anesthetized using Cetacaine spray and viscous lidocaine. Initial attempt to pass the nasogastric tube through the left nostril resulted in failure of advancement of the nasogastric tube past the posterior nasopharynx despite gentle advancement and manipulation of the tube. This elicited substantial gag response and nausea from the patient. Eventually became clear that the tube would not pass. Subsequent attempt to pass the nasogastric tube through the right nostril resulted in failure of advancement of the nasogastric tube pass the posterior nasopharynx despite gentle advancement and manipulation of the tube. As before this elicited its substantial gag response and nausea from the patient. In this situation of failure to pass the nasogastric tube beyond the nasopharynx, the use of fluoroscopy does not confer a benefit. Fluoroscopy is mainly useful to differentiate bronchial from esophageal placement when the tube is extending through the mid chest. IMPRESSION: 1. Unsuccessful nasogastric tube placement despite the use of both nostrils and cooperation by the patient. The tube fails to advance beyond the posterior nasopharynx on either side despite gentle pressure and tube manipulation and multiple attempts bilaterally. Endoscopic assisted to placement may be necessary. Electronically Signed   By: Van Clines M.D.   On: 01/21/2021 09:20    ROS Blood pressure (!) 131/95, pulse 72, temperature 97.8 F (36.6 C), temperature source Oral, resp. rate 18, height 5\' 11"  (1.803 m), weight 49 kg, SpO2 95 %. Physical Exam HENT:     Head: Normocephalic and  atraumatic.     Comments: Sprayed with afrin lidocaine. The fibroptic scope placed and no abnormalities of airway seen. The NGT was placed into the left nares and watched going into the pyriform then advanced without any resistance or trouble. Brown gastric material was flowing from the tube.      Assessment/Plan: SBO- with fibroptic guidance there was no obvious abnormalities. The tube was easily placed into the esophagus and the same emesis material was drawn from the NGT. It was secured and nurse is ordering a xray. Notified GS that tube is in place and they will follow up on the xray and future use of the tube.   Juan David 01/21/2021, 12:44 PM

## 2021-01-22 ENCOUNTER — Inpatient Hospital Stay (HOSPITAL_COMMUNITY): Payer: 59

## 2021-01-22 DIAGNOSIS — K5669 Other partial intestinal obstruction: Secondary | ICD-10-CM | POA: Diagnosis not present

## 2021-01-22 DIAGNOSIS — K6389 Other specified diseases of intestine: Secondary | ICD-10-CM | POA: Diagnosis not present

## 2021-01-22 LAB — COMPREHENSIVE METABOLIC PANEL
ALT: 17 U/L (ref 0–44)
AST: 16 U/L (ref 15–41)
Albumin: 3.3 g/dL — ABNORMAL LOW (ref 3.5–5.0)
Alkaline Phosphatase: 69 U/L (ref 38–126)
Anion gap: 7 (ref 5–15)
BUN: 24 mg/dL — ABNORMAL HIGH (ref 8–23)
CO2: 25 mmol/L (ref 22–32)
Calcium: 9.1 mg/dL (ref 8.9–10.3)
Chloride: 107 mmol/L (ref 98–111)
Creatinine, Ser: 0.59 mg/dL — ABNORMAL LOW (ref 0.61–1.24)
GFR, Estimated: >60 mL/min (ref >60)
Glucose, Bld: 98 mg/dL (ref 70–99)
Potassium: 4 mmol/L (ref 3.5–5.1)
Sodium: 139 mmol/L (ref 135–145)
Total Bilirubin: 0.6 mg/dL (ref 0.3–1.2)
Total Protein: 6.5 g/dL (ref 6.5–8.1)

## 2021-01-22 LAB — MAGNESIUM: Magnesium: 2.1 mg/dL (ref 1.7–2.4)

## 2021-01-22 LAB — TRIGLYCERIDES: Triglycerides: 111 mg/dL (ref <150)

## 2021-01-22 LAB — PHOSPHORUS: Phosphorus: 2.4 mg/dL — ABNORMAL LOW (ref 2.5–4.6)

## 2021-01-22 MED ORDER — ACETAMINOPHEN 10 MG/ML IV SOLN
1000.0000 mg | Freq: Four times a day (QID) | INTRAVENOUS | Status: AC | PRN
Start: 2021-01-22 — End: 2021-01-23

## 2021-01-22 MED ORDER — SODIUM CHLORIDE 0.9 % IV SOLN: INTRAVENOUS | Status: AC

## 2021-01-22 MED ORDER — TRAVASOL 10 % IV SOLN
INTRAVENOUS | Status: AC
  Filled 2021-01-22: qty 460.8

## 2021-01-22 MED ORDER — DIATRIZOATE MEGLUMINE & SODIUM 66-10 % PO SOLN
90.0000 mL | Freq: Once | ORAL | Status: AC
  Administered 2021-01-22: 90 mL via NASOGASTRIC
  Filled 2021-01-22: qty 90

## 2021-01-22 MED ORDER — SODIUM PHOSPHATES 45 MMOLE/15ML IV SOLN
15.0000 mmol | Freq: Once | INTRAVENOUS | Status: AC
  Administered 2021-01-22: 15 mmol via INTRAVENOUS
  Filled 2021-01-22: qty 5

## 2021-01-22 MED ORDER — METOPROLOL TARTRATE 5 MG/5ML IV SOLN: 5.0000 mg | Freq: Four times a day (QID) | INTRAVENOUS | Status: DC | PRN

## 2021-01-22 MED ORDER — INSULIN ASPART 100 UNIT/ML IJ SOLN
0.0000 [IU] | Freq: Four times a day (QID) | INTRAMUSCULAR | Status: DC
Start: 2021-01-23 — End: 2021-01-23
  Administered 2021-01-23: 1 [IU] via SUBCUTANEOUS

## 2021-01-22 NOTE — Progress Notes (Signed)
PHARMACY - TOTAL PARENTERAL NUTRITION CONSULT NOTE   Indication: Small bowel obstruction   Patient Measurements: Height: 5\' 11"  (180.3 cm) Weight: 49 kg (108 lb) IBW/kg (Calculated) : 75.3 TPN AdjBW (KG): 49 Body mass index is 15.06 kg/m. Usual Weight:   Assessment: 23 yoM presents with N/V, worsening abdominal pain, found to have SBO.  PMH includes rectal adenocarcinoma, recent small bowel perforation s/p small bowel resection 0/37/04, complicated by abscess, and TPN for ileus.  He has been able to tolerate fluids and foods until 2 days ago when he developed persistent vomiting after eating.  NG tube placed for decompression.  At risk for refeeding.    Glucose / Insulin: monitor CBGs, A1c 5.9%; serum glucose 98 prior to starting TPN Electrolytes: K 4 after 3 runs yesterday, Mag 2.1 after 4 gm yesterday; phos 2.4 - sl low; , others wnl including CoCa - Goal K > 4 and Mg > 2 for bowel function Renal: BUN 26>24, SCr wnl Hepatic: LFTs wnl (11/18) Trig 111 11/18 Prealbumin: 18.1 (11/16) Intake / Output; MIVF:  11/17 NGT placed by ENT NGO 1050 mls/24 hrs Urine x 1 occurrence, no BM recorded NS 100 ml/hr GI Imaging: 11/16 CT: consistent with small bowel obstruction, likely secondary to adhesion. GI Surgeries / Procedures: 12/04/20 small bowel resection  Central access: PICC placed 11/17 for TPN TPN start date: 11/18  Nutritional Goals: Goal TPN rate 85 mL/hr provides 98 g of protein and 2113 kcals per day  RD Assessment:  (01/21/21) Estimated Needs Total Energy Estimated Needs: 2100-2300 Total Protein Estimated Needs: 85-100 grams Total Fluid Estimated Needs: >2.1 L  Current Nutrition:  NPO TPN  Plan:  Now: [place] 15 mMol start TPN at 40 ml/hr @ 1800 and assess tolerance Goal rate of 85 ml/hr will provide 100% of goals Electrolytes in TPN: standard concentration; Cl:Ac 1:1 Add MVI, trace elements  sensitive SSI  q6h and adjust as needed IVMF: decr NS 100 ml/hr> 60 ml/hr  when TPN @ 40 ml/hr started or per MD Monitor TPN labs, BMET, Mg & Phos in AM  Eudelia Bunch, Pharm.D 01/22/2021 9:46 AM

## 2021-01-22 NOTE — Progress Notes (Signed)
   01/22/21 1300  Mobility  Activity Refused mobility   Pt refused secondary to having contrast shortly (per pt). Will check back on the weekend.  Rudell Cobb Mobility Specialist Acute Rehab Services Office: [phone]

## 2021-01-22 NOTE — Progress Notes (Signed)
[facility] Progress Note  CC:  Consideration for EGD and colonoscopy during hospital admission   Subjective: He is resting comfortable after receiving Morphine 2mg  IV for upper abdominal pain. No BM, has passed some gas per there rectum.    Objective:  Vital signs in last 24 hours: Temp:  [97.8 F (36.6 C)-99.9 F (37.7 C)] 98 F (36.7 C) (11/18 0550) Pulse Rate:  [67-82] 72 (11/18 0550) Resp:  [16-18] 18 (11/18 0550) BP: (131-184)/(89-103) 174/95 (11/18 0550) SpO2:  [95 %-100 %] 100 % (11/18 0550) Last BM Date: 01/18/21 General:   Alert 65 year old male in NAD.  Heart: RRR, no murmur.  Pulm: Breath sounds clear throughout.  Abdomen: Soft, nondistended. Mild central lower abdominal tenderness without rebound or guarding. Few gurgling bowel sounds auscultated with NGT briefly clamped. NGT to LIS drained about 400cc brown watery drainage.  Extremities:  Without edema. Neurologic:  Alert and  oriented x4.  Psych:  Alert and cooperative. Normal mood and affect.  Intake/Output from previous day: 11/17 0701 - 11/18 0700 In: 2821.6 [P.O.:120; I.V.:2495; IV Piggyback:206.6] Out: 2150 [Urine:1100; Emesis/NG output:1050] Intake/Output this shift: No intake/output data recorded.  Lab Results: Recent Labs    01/20/21 0804 01/21/21 0413  WBC 16.8* 7.5  HGB 13.7 13.1  HCT 41.6 39.9  PLT 326 242   BMET Recent Labs    01/20/21 0804 01/21/21 0413 01/22/21 0302  NA 137 139 139  K 3.8 3.7 4.0  CL 98 104 107  CO2 28 24 25   GLUCOSE 192* 111* 98  BUN 25* 26* 24*  CREATININE 0.73 0.67 0.59*  CALCIUM 10.2 9.6 9.1   LFT Recent Labs    01/22/21 0302  PROT 6.5  ALBUMIN 3.3*  AST 16  ALT 17  ALKPHOS 69  BILITOT 0.6   PT/INR No results for input(s): LABPROT, INR in the last 72 hours. Hepatitis Panel No results for input(s): HEPBSAG, HCVAB, HEPAIGM, HEPBIGM in the last 72 hours.  CT Abdomen Pelvis W Contrast  Result Date: 01/20/2021 CLINICAL DATA:   Bowel obstruction suspected, severe right-sided abdominal pain, nausea, and vomiting, status post recent small-bowel resection, history of rectal cancer and bowel perforation EXAM: CT ABDOMEN AND PELVIS WITH CONTRAST TECHNIQUE: Multidetector CT imaging of the abdomen and pelvis was performed using the standard protocol following bolus administration of intravenous contrast. CONTRAST:  43mL OMNIPAQUE IOHEXOL 350 MG/ML SOLN COMPARISON:  12/16/2020 FINDINGS: Lower chest: No acute abnormality. Unchanged 0.5 cm nodule of the right lower lobe (series 6, image 7). Hepatobiliary: No solid liver abnormality is seen. No gallstones, gallbladder wall thickening, or biliary dilatation. Pancreas: Unremarkable. No pancreatic ductal dilatation or surrounding inflammatory changes. Spleen: Normal in size without significant abnormality. Adrenals/Urinary Tract: Adrenal glands are unremarkable. Kidneys are normal, without renal calculi, solid lesion, or hydronephrosis. Bladder is unremarkable. Stomach/Bowel: Stomach is within normal limits. The mid to distal small bowel is diffusely gas and fluid distended, with multiple fecalized loops in the low abdomen and pelvis. Loops measure up to 3.7 cm in caliber. There is a fecalized loop with an abrupt caliber change in the low midline abdomen (series 2, image 64, series 4, image 42) with complete decompression of approximately the distal 30 cm of ileum. There is scattered gas and stool present throughout the colon to the rectum. Redemonstrated distal small bowel resection and reanastomosis in the low pelvis (series 2, image 66). Previously seen percutaneous drainage catheter in the right hemipelvis is removed. There is no significant residual  fluid collection in this vicinity. Sigmoid diverticulosis. Vascular/Lymphatic: Aortic atherosclerosis. No enlarged abdominal or pelvic lymph nodes. Reproductive: No mass or other significant abnormality. Other: No abdominal wall hernia or abnormality.  New small volume ascites throughout the abdomen and pelvis. Musculoskeletal: No acute or significant osseous findings. IMPRESSION: 1. The mid to distal small bowel is diffusely gas and fluid distended, with multiple fecalized loops in the low abdomen and pelvis. Loops measure up to 3.7 cm in caliber. There is a fecalized loop with an abrupt caliber change in the low midline abdomen with complete decompression of approximately the distal 30 cm of ileum. Findings are consistent with small bowel obstruction, likely secondary to adhesion. 2. Previously seen percutaneous drainage catheter in the right hemipelvis is removed. There is no significant residual fluid collection in this vicinity. 3. Redemonstrated distal small bowel resection and reanastomosis in the low pelvis. 4. New small volume ascites throughout the abdomen and pelvis. Aortic Atherosclerosis (ICD10-I70.0). Electronically Signed   By: Delanna Ahmadi M.D.   On: 01/20/2021 10:30   DG Abd Portable 1V  Result Date: 01/21/2021 CLINICAL DATA:  NG tube placement EXAM: PORTABLE ABDOMEN - 1 VIEW COMPARISON:  10/16/2007 FINDINGS: NG tube enters the stomach with the tip in the gastric body. Dilated small bowel loops again noted consistent with small-bowel obstruction. Lung bases clear. IMPRESSION: NG tip in the body the stomach. Small-bowel obstruction pattern again noted. Electronically Signed   By: Franchot Gallo M.D.   On: 01/21/2021 15:04   DG Naso G Tube Plc W/Fl W/Rad  Result Date: 01/21/2021 CLINICAL DATA:  Abdominal pain, bowel obstruction. EXAM: NASO G TUBE PLACEMENT WITH FL AND WITH RAD FLUOROSCOPY TIME:  Fluoroscopy Time:  0 minutes, 30 seconds Radiation Exposure Index (if provided by the fluoroscopic device): 2.1 mGy Number of Acquired Spot Images: 0 COMPARISON:  None. FINDINGS: The patient was cooperative with 61 French nasogastric tube placement. Both nostrils were anesthetized using Cetacaine spray and viscous lidocaine. Initial attempt to  pass the nasogastric tube through the left nostril resulted in failure of advancement of the nasogastric tube past the posterior nasopharynx despite gentle advancement and manipulation of the tube. This elicited substantial gag response and nausea from the patient. Eventually became clear that the tube would not pass. Subsequent attempt to pass the nasogastric tube through the right nostril resulted in failure of advancement of the nasogastric tube pass the posterior nasopharynx despite gentle advancement and manipulation of the tube. As before this elicited its substantial gag response and nausea from the patient. In this situation of failure to pass the nasogastric tube beyond the nasopharynx, the use of fluoroscopy does not confer a benefit. Fluoroscopy is mainly useful to differentiate bronchial from esophageal placement when the tube is extending through the mid chest. IMPRESSION: 1. Unsuccessful nasogastric tube placement despite the use of both nostrils and cooperation by the patient. The tube fails to advance beyond the posterior nasopharynx on either side despite gentle pressure and tube manipulation and multiple attempts bilaterally. Endoscopic assisted to placement may be necessary. Electronically Signed   By: Van Clines M.D.   On: 01/21/2021 09:20   [place] EKG SITE RITE  Result Date: 01/21/2021 If Site Rite image not attached, placement could not be confirmed due to current cardiac rhythm.   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 He was eventually discharged home on 12/21/2020.  He was evaluated by Dr. Carlean Purl prior to this hospital admission as noted  above with plans for an EGD and colonoscopy scheduled January 2023. He developed acute central abdominal pain with projectile vomiting and was readmitted to the hospital 01/20/2021.  CTAP identified a small bowel obstruction likely due to adhesions. NGT was successfully placed by ENT 11/17. NGT drained about 400cc brown water drainage. On TPN.  -General surgery following, abd xray SBO protocol 8 hr delay ordered  -Eventual EGD and colonoscopy, not sure these procedure will be done during this hospital admission in setting of SBO -Our service will follow him peripherally over the weekend     Active Problems:   SBO (small bowel obstruction) ([place])     LOS: 2 days   Noralyn Pick  01/22/2021, 10:26 AM

## 2021-01-22 NOTE — Progress Notes (Signed)
Patient ID: Juan David, male   DOB: 21-Jul-1955, 65 y.o.   MRN: [id number] [facility] Surgery Progress Note     Subjective: CC-  Feeling better since NG tube placement. Abdominal pain improving. Denies n/v. Passing flatus, no BM.  Objective: Vital signs in last 24 hours: Temp:  [97.8 F (36.6 C)-99.9 F (37.7 C)] 98 F (36.7 C) (11/18 0550) Pulse Rate:  [67-82] 72 (11/18 0550) Resp:  [16-18] 18 (11/18 0550) BP: (131-184)/(89-103) 174/95 (11/18 0550) SpO2:  [95 %-100 %] 100 % (11/18 0550) Last BM Date: 01/18/21  Intake/Output from previous day: 11/17 0701 - 11/18 0700 In: 2821.6 [P.O.:120; I.V.:2495; IV Piggyback:206.6] Out: 2150 [Urine:1100; Emesis/NG output:1050] Intake/Output this shift: No intake/output data recorded.  PE: Gen:  Alert, NAD, pleasant Card:  RRR Pulm:  rate and effort normal Abd: Soft, minimal distension, nontender, few bowel sounds heard, midline wound healing well with pinpoint area of granulation tissue at inferior most aspect of incision  Lab Results:  Recent Labs    01/20/21 0804 01/21/21 0413  WBC 16.8* 7.5  HGB 13.7 13.1  HCT 41.6 39.9  PLT 326 242   BMET Recent Labs    01/21/21 0413 01/22/21 0302  NA 139 139  K 3.7 4.0  CL 104 107  CO2 24 25  GLUCOSE 111* 98  BUN 26* 24*  CREATININE 0.67 0.59*  CALCIUM 9.6 9.1   PT/INR No results for input(s): LABPROT, INR in the last 72 hours. CMP     Component Value Date/Time   NA 139 01/22/2021 0302   NA 139 12/27/2016 1237   K 4.0 01/22/2021 0302   K 4.2 12/27/2016 1237   CL 107 01/22/2021 0302   CO2 25 01/22/2021 0302   CO2 25 12/27/2016 1237   GLUCOSE 98 01/22/2021 0302   GLUCOSE 112 12/27/2016 1237   BUN 24 (H) 01/22/2021 0302   BUN 15.2 12/27/2016 1237   CREATININE 0.59 (L) 01/22/2021 0302   CREATININE 1.1 12/27/2016 1237   CALCIUM 9.1 01/22/2021 0302   CALCIUM 9.7 12/27/2016 1237   PROT 6.5 01/22/2021 0302   PROT 7.6 12/27/2016 1237   ALBUMIN 3.3 (L) 01/22/2021  0302   ALBUMIN 4.1 12/27/2016 1237   AST 16 01/22/2021 0302   AST 20 12/27/2016 1237   ALT 17 01/22/2021 0302   ALT 18 12/27/2016 1237   ALKPHOS 69 01/22/2021 0302   ALKPHOS 80 12/27/2016 1237   BILITOT 0.6 01/22/2021 0302   BILITOT 0.45 12/27/2016 1237   GFRNONAA >60 01/22/2021 0302   GFRAA >60 11/20/2017 0948   Lipase     Component Value Date/Time   LIPASE 29 01/20/2021 0804       Studies/Results: CT Abdomen Pelvis W Contrast  Result Date: 01/20/2021 CLINICAL DATA:  Bowel obstruction suspected, severe right-sided abdominal pain, nausea, and vomiting, status post recent small-bowel resection, history of rectal cancer and bowel perforation EXAM: CT ABDOMEN AND PELVIS WITH CONTRAST TECHNIQUE: Multidetector CT imaging of the abdomen and pelvis was performed using the standard protocol following bolus administration of intravenous contrast. CONTRAST:  78mL OMNIPAQUE IOHEXOL 350 MG/ML SOLN COMPARISON:  12/16/2020 FINDINGS: Lower chest: No acute abnormality. Unchanged 0.5 cm nodule of the right lower lobe (series 6, image 7). Hepatobiliary: No solid liver abnormality is seen. No gallstones, gallbladder wall thickening, or biliary dilatation. Pancreas: Unremarkable. No pancreatic ductal dilatation or surrounding inflammatory changes. Spleen: Normal in size without significant abnormality. Adrenals/Urinary Tract: Adrenal glands are unremarkable. Kidneys are normal, without renal calculi, solid lesion,  or hydronephrosis. Bladder is unremarkable. Stomach/Bowel: Stomach is within normal limits. The mid to distal small bowel is diffusely gas and fluid distended, with multiple fecalized loops in the low abdomen and pelvis. Loops measure up to 3.7 cm in caliber. There is a fecalized loop with an abrupt caliber change in the low midline abdomen (series 2, image 64, series 4, image 42) with complete decompression of approximately the distal 30 cm of ileum. There is scattered gas and stool present  throughout the colon to the rectum. Redemonstrated distal small bowel resection and reanastomosis in the low pelvis (series 2, image 66). Previously seen percutaneous drainage catheter in the right hemipelvis is removed. There is no significant residual fluid collection in this vicinity. Sigmoid diverticulosis. Vascular/Lymphatic: Aortic atherosclerosis. No enlarged abdominal or pelvic lymph nodes. Reproductive: No mass or other significant abnormality. Other: No abdominal wall hernia or abnormality. New small volume ascites throughout the abdomen and pelvis. Musculoskeletal: No acute or significant osseous findings. IMPRESSION: 1. The mid to distal small bowel is diffusely gas and fluid distended, with multiple fecalized loops in the low abdomen and pelvis. Loops measure up to 3.7 cm in caliber. There is a fecalized loop with an abrupt caliber change in the low midline abdomen with complete decompression of approximately the distal 30 cm of ileum. Findings are consistent with small bowel obstruction, likely secondary to adhesion. 2. Previously seen percutaneous drainage catheter in the right hemipelvis is removed. There is no significant residual fluid collection in this vicinity. 3. Redemonstrated distal small bowel resection and reanastomosis in the low pelvis. 4. New small volume ascites throughout the abdomen and pelvis. Aortic Atherosclerosis (ICD10-I70.0). Electronically Signed   By: Delanna Ahmadi M.D.   On: 01/20/2021 10:30   DG Abd Portable 1V  Result Date: 01/21/2021 CLINICAL DATA:  NG tube placement EXAM: PORTABLE ABDOMEN - 1 VIEW COMPARISON:  10/16/2007 FINDINGS: NG tube enters the stomach with the tip in the gastric body. Dilated small bowel loops again noted consistent with small-bowel obstruction. Lung bases clear. IMPRESSION: NG tip in the body the stomach. Small-bowel obstruction pattern again noted. Electronically Signed   By: Franchot Gallo M.D.   On: 01/21/2021 15:04   DG Naso G Tube Plc  W/Fl W/Rad  Result Date: 01/21/2021 CLINICAL DATA:  Abdominal pain, bowel obstruction. EXAM: NASO G TUBE PLACEMENT WITH FL AND WITH RAD FLUOROSCOPY TIME:  Fluoroscopy Time:  0 minutes, 30 seconds Radiation Exposure Index (if provided by the fluoroscopic device): 2.1 mGy Number of Acquired Spot Images: 0 COMPARISON:  None. FINDINGS: The patient was cooperative with 10 French nasogastric tube placement. Both nostrils were anesthetized using Cetacaine spray and viscous lidocaine. Initial attempt to pass the nasogastric tube through the left nostril resulted in failure of advancement of the nasogastric tube past the posterior nasopharynx despite gentle advancement and manipulation of the tube. This elicited substantial gag response and nausea from the patient. Eventually became clear that the tube would not pass. Subsequent attempt to pass the nasogastric tube through the right nostril resulted in failure of advancement of the nasogastric tube pass the posterior nasopharynx despite gentle advancement and manipulation of the tube. As before this elicited its substantial gag response and nausea from the patient. In this situation of failure to pass the nasogastric tube beyond the nasopharynx, the use of fluoroscopy does not confer a benefit. Fluoroscopy is mainly useful to differentiate bronchial from esophageal placement when the tube is extending through the mid chest. IMPRESSION: 1. Unsuccessful nasogastric tube placement  despite the use of both nostrils and cooperation by the patient. The tube fails to advance beyond the posterior nasopharynx on either side despite gentle pressure and tube manipulation and multiple attempts bilaterally. Endoscopic assisted to placement may be necessary. Electronically Signed   By: Van Clines M.D.   On: 01/21/2021 09:20   [place] EKG SITE RITE  Result Date: 01/21/2021 If Site Rite image not attached, placement could not be confirmed due to current cardiac rhythm.    Anti-infectives: Anti-infectives (From admission, onward)    None        Assessment/Plan SBO Hx ex lap with SBR for focal ischemic perforation by Dr. Zenia Resides on 12/04/20  - discharged 10/17 then readmitted 11/16 with SBO - NG finally able to be placed by ENT 11/17. Continue to LIWS. Will start SBO protocol. Mobilize.    FEN - NPO, NGT to LIWS, IVF VTE - SCDs, Lovenox ID - None currently Foley - None  HTN - IV metoprolol PRN while NPO   LOS: 2 days    Wellington Hampshire, [facility] Surgery 01/22/2021, 10:21 AM Please see Amion for pager number during day hours 7:00am-4:30pm

## 2021-01-23 ENCOUNTER — Inpatient Hospital Stay (HOSPITAL_COMMUNITY): Payer: 59

## 2021-01-23 DIAGNOSIS — K6389 Other specified diseases of intestine: Secondary | ICD-10-CM | POA: Diagnosis not present

## 2021-01-23 DIAGNOSIS — K56609 Unspecified intestinal obstruction, unspecified as to partial versus complete obstruction: Secondary | ICD-10-CM | POA: Diagnosis not present

## 2021-01-23 LAB — GLUCOSE, CAPILLARY
Glucose-Capillary: 106 mg/dL — ABNORMAL HIGH (ref 70–99)
Glucose-Capillary: 113 mg/dL — ABNORMAL HIGH (ref 70–99)
Glucose-Capillary: 114 mg/dL — ABNORMAL HIGH (ref 70–99)
Glucose-Capillary: 117 mg/dL — ABNORMAL HIGH (ref 70–99)
Glucose-Capillary: 125 mg/dL — ABNORMAL HIGH (ref 70–99)
Glucose-Capillary: 139 mg/dL — ABNORMAL HIGH (ref 70–99)

## 2021-01-23 LAB — CBC
HCT: 32.5 % — ABNORMAL LOW (ref 39.0–52.0)
Hemoglobin: 10.8 g/dL — ABNORMAL LOW (ref 13.0–17.0)
MCH: 29.6 pg (ref 26.0–34.0)
MCHC: 33.2 g/dL (ref 30.0–36.0)
MCV: 89 fL (ref 80.0–100.0)
Platelets: 218 10*3/uL (ref 150–400)
RBC: 3.65 MIL/uL — ABNORMAL LOW (ref 4.22–5.81)
RDW: 13.9 % (ref 11.5–15.5)
WBC: 8.1 10*3/uL (ref 4.0–10.5)
nRBC: 0 % (ref 0.0–0.2)

## 2021-01-23 LAB — BASIC METABOLIC PANEL
Anion gap: 6 (ref 5–15)
BUN: 18 mg/dL (ref 8–23)
CO2: 28 mmol/L (ref 22–32)
Calcium: 8.8 mg/dL — ABNORMAL LOW (ref 8.9–10.3)
Chloride: 104 mmol/L (ref 98–111)
Creatinine, Ser: 0.7 mg/dL (ref 0.61–1.24)
GFR, Estimated: >60 mL/min (ref >60)
Glucose, Bld: 132 mg/dL — ABNORMAL HIGH (ref 70–99)
Potassium: 3.2 mmol/L — ABNORMAL LOW (ref 3.5–5.1)
Sodium: 138 mmol/L (ref 135–145)

## 2021-01-23 LAB — PHOSPHORUS: Phosphorus: 2.6 mg/dL (ref 2.5–4.6)

## 2021-01-23 LAB — MAGNESIUM: Magnesium: 1.9 mg/dL (ref 1.7–2.4)

## 2021-01-23 MED ORDER — TRAVASOL 10 % IV SOLN
INTRAVENOUS | Status: AC
  Filled 2021-01-23: qty 979.2

## 2021-01-23 MED ORDER — POTASSIUM CHLORIDE 10 MEQ/100ML IV SOLN
10.0000 meq | INTRAVENOUS | Status: AC
  Administered 2021-01-23 (×4): 10 meq via INTRAVENOUS
  Filled 2021-01-23 (×3): qty 100

## 2021-01-23 MED ORDER — LIP MEDEX EX OINT
TOPICAL_OINTMENT | CUTANEOUS | Status: AC
  Filled 2021-01-23: qty 7

## 2021-01-23 MED ORDER — INSULIN ASPART 100 UNIT/ML IJ SOLN
0.0000 [IU] | Freq: Four times a day (QID) | INTRAMUSCULAR | Status: DC
Start: 2021-01-23 — End: 2021-01-28
  Administered 2021-01-23 – 2021-01-28 (×8): 2 [IU] via SUBCUTANEOUS

## 2021-01-23 MED ORDER — SODIUM CHLORIDE 0.9 % IV SOLN: INTRAVENOUS | Status: DC

## 2021-01-23 NOTE — Progress Notes (Signed)
PHARMACY - TOTAL PARENTERAL NUTRITION CONSULT NOTE   Indication: Small bowel obstruction   Patient Measurements: Height: 5\' 11"  (180.3 cm) Weight: 49 kg (108 lb) IBW/kg (Calculated) : 75.3 TPN AdjBW (KG): 49 Body mass index is 15.06 kg/m. Usual Weight:   Assessment: 26 yoM presents with N/V, worsening abdominal pain, found to have SBO.  PMH includes rectal adenocarcinoma, recent small bowel perforation s/p small bowel resection 6/71/24, complicated by abscess, and TPN for ileus.  He has been able to tolerate fluids and foods until 2 days ago when he developed persistent vomiting after eating.  NG tube placed for decompression.    Glucose / Insulin: A1c 5.9%. CBGs controlled on SSI. 1 unit in last 24 hrs Electrolytes: K 4 after 3 runs yesterday, Mag 2.1 after 4 gm yesterday; phos 2.4 - sl low; , others wnl including CoCa - Goal K > 4 and Mg > 2 for bowel function Renal: BUN 26>24, SCr wnl Hepatic: LFTs wnl (11/18) Trig 111 11/18 Prealbumin: 18.1 (11/16) Intake / Output; MIVF:  11/17 NGT placed by ENT NGO 1300 mls/24 hrs Minimal urine output, no BM recorded NS at 60 ml/hr GI Imaging: 11/16 CT: consistent with small bowel obstruction, likely secondary to adhesion. 11/18 Abdominal Xray shows persistent partial SBO GI Surgeries / Procedures: 12/04/20 small bowel resection  Central access: PICC placed 11/17 for TPN TPN start date: 11/18  Nutritional Goals: Goal TPN rate 85 mL/hr provides 98 g of protein and 2113 kcals per day  RD Assessment:  Estimated Needs Total Energy Estimated Needs: 2100-2300 Total Protein Estimated Needs: 85-100 grams Total Fluid Estimated Needs: >2.1 L  Current Nutrition:  NPO TPN  Plan:  KCl 15mEq IV x 4 runs  Increase TPN to 47mL/hr at 1800 Electrolytes in TPN: Na 75mEq/L, K 38mEq/L, Ca 23mEq/L, Mg 104mEq/L, and Phos 24mmol/L. Cl:Ac 1:1 Add standard MVI and trace elements to TPN Increase to moderate q6h SSI and adjust as needed  Reduce MIVF to KVO  at 1800 Monitor Bmet, Mg, and Phos tomorrow, TPN labs on Mon/Thurs  Elenor Quinones, PharmD, BCPS, BCIDP Clinical Pharmacist 01/23/2021 7:43 AM

## 2021-01-23 NOTE — Progress Notes (Signed)
   Subjective/Chief Complaint: Pt doing well this AM Passing some flatus KUb results noted   Objective: Vital signs in last 24 hours: Temp:  [98.2 F (36.8 C)-99.3 F (37.4 C)] 98.2 F (36.8 C) (11/19 0620) Pulse Rate:  [70-91] 91 (11/19 0620) Resp:  [14-16] 14 (11/19 0620) BP: (155-161)/(88-102) 160/88 (11/19 0620) SpO2:  [95 %-99 %] 99 % (11/19 0620) Last BM Date: 01/18/21  Intake/Output from previous day: 11/18 0701 - 11/19 0700 In: 2478.8 [P.O.:240; I.V.:2016.8; IV Piggyback:222.1] Out: 1550 [Urine:250; Emesis/NG DZHGDJ:2426] Intake/Output this shift: No intake/output data recorded.  PE:  Constitutional: No acute distress, conversant, appears states age. Eyes: Anicteric sclerae, moist conjunctiva, no lid lag Lungs: Clear to auscultation bilaterally, normal respiratory effort CV: regular rate and rhythm, no murmurs, no peripheral edema, pedal pulses 2+ GI: Soft, no masses or hepatosplenomegaly, non-tender to palpation Skin: No rashes, palpation reveals normal turgor Psychiatric: appropriate judgment and insight, oriented to person, place, and time   Lab Results:  Recent Labs    01/21/21 0413 01/23/21 0405  WBC 7.5 8.1  HGB 13.1 10.8*  HCT 39.9 32.5*  PLT 242 218   BMET Recent Labs    01/22/21 0302 01/23/21 0405  NA 139 138  K 4.0 3.2*  CL 107 104  CO2 25 28  GLUCOSE 98 132*  BUN 24* 18  CREATININE 0.59* 0.70  CALCIUM 9.1 8.8*   PT/INR No results for input(s): LABPROT, INR in the last 72 hours. ABG No results for input(s): PHART, HCO3 in the last 72 hours.  Invalid input(s): PCO2, PO2  Studies/Results: DG Abd Portable 1V-Small Bowel Obstruction Protocol-initial, 8 hr delay  Result Date: 01/22/2021 CLINICAL DATA:  Follow up small bowel obstruction EXAM: PORTABLE ABDOMEN - 1 VIEW COMPARISON:  Film from the previous day. FINDINGS: Gastric catheter is again noted within the stomach. Previously administered contrast now lies within the colon.  Persistent small bowel dilatation is noted. These changes are consistent with a partial small bowel obstruction. IMPRESSION: Persistent partial small bowel obstruction. Previously administered contrast now lies within the colon. Electronically Signed   By: Inez Catalina M.D.   On: 01/22/2021 21:21   DG Abd Portable 1V  Result Date: 01/21/2021 CLINICAL DATA:  NG tube placement EXAM: PORTABLE ABDOMEN - 1 VIEW COMPARISON:  10/16/2007 FINDINGS: NG tube enters the stomach with the tip in the gastric body. Dilated small bowel loops again noted consistent with small-bowel obstruction. Lung bases clear. IMPRESSION: NG tip in the body the stomach. Small-bowel obstruction pattern again noted. Electronically Signed   By: Franchot Gallo M.D.   On: 01/21/2021 15:04   [place] EKG SITE RITE  Result Date: 01/21/2021 If Site Rite image not attached, placement could not be confirmed due to current cardiac rhythm.      Assessment/Plan: SBO Hx ex lap with SBR for focal ischemic perforation by Dr. Zenia Resides on 12/04/20  - plan to clamp NGT -Will start clears   FEN - CLD, TPN VTE - SCDs, Lovenox ID - None currently Foley - None   HTN - IV metoprolol PRN while NPO   LOS: 3 days    Juan David 01/23/2021

## 2021-01-24 ENCOUNTER — Inpatient Hospital Stay (HOSPITAL_COMMUNITY): Payer: 59

## 2021-01-24 DIAGNOSIS — R109 Unspecified abdominal pain: Secondary | ICD-10-CM | POA: Diagnosis not present

## 2021-01-24 LAB — BASIC METABOLIC PANEL
Anion gap: 7 (ref 5–15)
BUN: 16 mg/dL (ref 8–23)
CO2: 26 mmol/L (ref 22–32)
Calcium: 8.6 mg/dL — ABNORMAL LOW (ref 8.9–10.3)
Chloride: 103 mmol/L (ref 98–111)
Creatinine, Ser: 0.62 mg/dL (ref 0.61–1.24)
GFR, Estimated: >60 mL/min (ref >60)
Glucose, Bld: 118 mg/dL — ABNORMAL HIGH (ref 70–99)
Potassium: 3.7 mmol/L (ref 3.5–5.1)
Sodium: 136 mmol/L (ref 135–145)

## 2021-01-24 LAB — CBC
HCT: 32.7 % — ABNORMAL LOW (ref 39.0–52.0)
Hemoglobin: 10.8 g/dL — ABNORMAL LOW (ref 13.0–17.0)
MCH: 29.3 pg (ref 26.0–34.0)
MCHC: 33 g/dL (ref 30.0–36.0)
MCV: 88.6 fL (ref 80.0–100.0)
Platelets: 193 10*3/uL (ref 150–400)
RBC: 3.69 MIL/uL — ABNORMAL LOW (ref 4.22–5.81)
RDW: 13.9 % (ref 11.5–15.5)
WBC: 6.9 10*3/uL (ref 4.0–10.5)
nRBC: 0 % (ref 0.0–0.2)

## 2021-01-24 LAB — GLUCOSE, CAPILLARY
Glucose-Capillary: 116 mg/dL — ABNORMAL HIGH (ref 70–99)
Glucose-Capillary: 119 mg/dL — ABNORMAL HIGH (ref 70–99)
Glucose-Capillary: 126 mg/dL — ABNORMAL HIGH (ref 70–99)
Glucose-Capillary: 137 mg/dL — ABNORMAL HIGH (ref 70–99)

## 2021-01-24 LAB — MAGNESIUM: Magnesium: 1.9 mg/dL (ref 1.7–2.4)

## 2021-01-24 LAB — PHOSPHORUS: Phosphorus: 2.8 mg/dL (ref 2.5–4.6)

## 2021-01-24 MED ORDER — MAGNESIUM SULFATE 2 GM/50ML IV SOLN
2.0000 g | Freq: Once | INTRAVENOUS | Status: AC
  Administered 2021-01-24: 2 g via INTRAVENOUS
  Filled 2021-01-24: qty 50

## 2021-01-24 MED ORDER — HYDRALAZINE HCL 20 MG/ML IJ SOLN: 10.0000 mg | INTRAMUSCULAR | Status: DC | PRN

## 2021-01-24 MED ORDER — TRAVASOL 10 % IV SOLN
INTRAVENOUS | Status: AC
  Filled 2021-01-24: qty 979.2

## 2021-01-24 MED ORDER — POTASSIUM CHLORIDE 10 MEQ/100ML IV SOLN
10.0000 meq | INTRAVENOUS | Status: AC
  Administered 2021-01-24 (×3): 10 meq via INTRAVENOUS
  Filled 2021-01-24: qty 100

## 2021-01-24 NOTE — Progress Notes (Signed)
PHARMACY - TOTAL PARENTERAL NUTRITION CONSULT NOTE   Indication: Small bowel obstruction   Patient Measurements: Height: 5\' 11"  (180.3 cm) Weight: 55.4 kg (122 lb 2.2 oz) IBW/kg (Calculated) : 75.3 TPN AdjBW (KG): 49 Body mass index is 17.03 kg/m. Usual Weight:   Assessment: 74 yoM presents with N/V, worsening abdominal pain, found to have SBO.  PMH includes rectal adenocarcinoma, recent small bowel perforation s/p small bowel resection 9/40/76, complicated by abscess, and TPN for ileus.  He has been able to tolerate fluids and foods until 2 days ago when he developed persistent vomiting after eating.  NG tube placed for decompression.    Glucose / Insulin: A1c 5.9%. CBGs controlled on mSSI. 4 units in last 24 hrs Electrolytes: K 3.7 after 4 runs yesterday, Mag 1.9, others wnl including CoCa - Goal K > 4 and Mg > 2 for bowel function Renal: SCr and BUN down to wnl Hepatic: LFTs wnl, Trig 111  Prealbumin: 18.1 (11/16) Intake / Output; MIVF: UOP 1700 mL/24hr NGT placed by ENT; NGO 1000 mls/24 hrs No BM recorded NS at 10 ml/hr GI Imaging: 11/16 CT: consistent with small bowel obstruction, likely secondary to adhesion. 11/18 Abdominal Xray shows persistent partial SBO GI Surgeries / Procedures: 12/04/20 small bowel resection  Central access: PICC placed 11/17 for TPN TPN start date: 11/18  Nutritional Goals: Goal TPN rate 85 mL/hr provides 98 g of protein and 2113 kcals per day  RD Assessment:  Estimated Needs Total Energy Estimated Needs: 2100-2300 Total Protein Estimated Needs: 85-100 grams Total Fluid Estimated Needs: >2.1 L  Current Nutrition:  CLD (11/19) TPN  Plan:  KCl 61mEq IV x 3 runs Magnesium 2g IV x1  Continue TPN 94mL/hr at 1800 Electrolytes in TPN: Na 65mEq/L, K 21mEq/L, Ca 4mEq/L, Mg 38mEq/L, and Phos 35mmol/L. Cl:Ac 1:1 Add standard MVI and trace elements to TPN Continue moderate q6h SSI and adjust as needed  MIVF to [facility]  TPN labs on  Mon/Thurs  Gretta Arab PharmD, BCPS Clinical Pharmacist [facility] main pharmacy [phone] 01/24/2021 8:18 AM

## 2021-01-24 NOTE — Progress Notes (Signed)
NG tube advance approx 4-5 cm per order, placement verified X 3 and retaped into place. Pt tol well, states nausea has subsided but still has mid/upper abd pain. Will cont to monitor.

## 2021-01-24 NOTE — Progress Notes (Signed)
Pt NG tube was clamped. Pt c/o pain and nausea but patient refused to have nausea medicine and pain medication. Pt requested to put his NG tube to suction for a while. Per patient request unclamped his NG tube for a while. Patient felt better after connect his NGT to suction. After 2 hrs clamped his NGT back. No c/o nausea and pain after that & pt is passing gas. Will continue to monitor.

## 2021-01-24 NOTE — Progress Notes (Signed)
Pt cont to sip on noncarbonated clears this am and NG tube remained clamped this am. However pt is now complaining of nausea, NG tube irrigated w air, then aspirated > 60 cc's of brownish material from NG tube. Instructed pt that I will briefly hook his NGT back up to suction and see if that provides relief. Pt only passing a sm amt of gas, reports only mucousy type "stool" from his rectum. MD notified of above.

## 2021-01-24 NOTE — Progress Notes (Signed)
   Subjective/Chief Complaint: Pt with some nausea and bloating yesterday NGT clamped this AM Passing some flatus   Objective: Vital signs in last 24 hours: Temp:  [98.4 F (36.9 C)-98.7 F (37.1 C)] 98.7 F (37.1 C) (11/20 0600) Pulse Rate:  [75-88] 88 (11/20 0600) Resp:  [17-18] 18 (11/20 0600) BP: (147-168)/(88-105) 152/104 (11/20 0600) SpO2:  [98 %-100 %] 99 % (11/20 0600) Weight:  [55.4 kg] 55.4 kg (11/20 0500) Last BM Date:  (Pt stated unsure)  Intake/Output from previous day: 11/19 0701 - 11/20 0700 In: 2362.7 [P.O.:590; I.V.:1772.7] Out: 2700 [Urine:1700; Emesis/NG output:1000] Intake/Output this shift: No intake/output data recorded.  General appearance: alert and cooperative GI: soft, non-tender; bowel sounds normal; no masses,  no organomegaly and no rebound/guarding  Lab Results:  Recent Labs    01/23/21 0405  WBC 8.1  HGB 10.8*  HCT 32.5*  PLT 218   BMET Recent Labs    01/23/21 0405 01/24/21 0325  NA 138 136  K 3.2* 3.7  CL 104 103  CO2 28 26  GLUCOSE 132* 118*  BUN 18 16  CREATININE 0.70 0.62  CALCIUM 8.8* 8.6*   PT/INR No results for input(s): LABPROT, INR in the last 72 hours. ABG No results for input(s): PHART, HCO3 in the last 72 hours.  Invalid input(s): PCO2, PO2  Studies/Results: DG Abd Portable 1V-Small Bowel Obstruction Protocol-24 hr delay  Result Date: 01/23/2021 CLINICAL DATA:  Small-bowel obstruction EXAM: PORTABLE ABDOMEN - 1 VIEW COMPARISON:  01/22/2021 FINDINGS: Supine frontal view of the abdomen and pelvis demonstrates enteric catheter unchanged. Oral contrast is seen throughout the colon. Persistent distended gas-filled loops of small bowel measuring up to 4 cm in diameter, stable. No masses or abnormal calcifications. IMPRESSION: 1. Persistent gaseous distention of the small bowel consistent with intermittent or incomplete obstruction, as oral contrast has progressed throughout the colon. No change since prior study.  Electronically Signed   By: Randa Ngo M.D.   On: 01/23/2021 20:18   DG Abd Portable 1V-Small Bowel Obstruction Protocol-initial, 8 hr delay  Result Date: 01/22/2021 CLINICAL DATA:  Follow up small bowel obstruction EXAM: PORTABLE ABDOMEN - 1 VIEW COMPARISON:  Film from the previous day. FINDINGS: Gastric catheter is again noted within the stomach. Previously administered contrast now lies within the colon. Persistent small bowel dilatation is noted. These changes are consistent with a partial small bowel obstruction. IMPRESSION: Persistent partial small bowel obstruction. Previously administered contrast now lies within the colon. Electronically Signed   By: Inez Catalina M.D.   On: 01/22/2021 21:21    Anti-infectives: Anti-infectives (From admission, onward)    None       Assessment/Plan: SBO Hx ex lap with SBR for focal ischemic perforation by Dr. Zenia Resides on 12/04/20  - plan to clamp NGT -Will start clears will go slow.  Await ROBF   FEN - CLD, TPN VTE - SCDs, Lovenox ID - None currently Foley - None   HTN - IV metoprolol PRN while NPO, will place parameters for dia bp and hydralazine   LOS: 4 days    Ralene Ok 01/24/2021

## 2021-01-25 ENCOUNTER — Inpatient Hospital Stay (HOSPITAL_COMMUNITY): Payer: 59

## 2021-01-25 LAB — CBC
HCT: 32 % — ABNORMAL LOW (ref 39.0–52.0)
Hemoglobin: 10.6 g/dL — ABNORMAL LOW (ref 13.0–17.0)
MCH: 29.7 pg (ref 26.0–34.0)
MCHC: 33.1 g/dL (ref 30.0–36.0)
MCV: 89.6 fL (ref 80.0–100.0)
Platelets: 198 10*3/uL (ref 150–400)
RBC: 3.57 MIL/uL — ABNORMAL LOW (ref 4.22–5.81)
RDW: 13.8 % (ref 11.5–15.5)
WBC: 6.2 10*3/uL (ref 4.0–10.5)
nRBC: 0 % (ref 0.0–0.2)

## 2021-01-25 LAB — COMPREHENSIVE METABOLIC PANEL
ALT: 128 U/L — ABNORMAL HIGH (ref 0–44)
AST: 76 U/L — ABNORMAL HIGH (ref 15–41)
Albumin: 2.8 g/dL — ABNORMAL LOW (ref 3.5–5.0)
Alkaline Phosphatase: 73 U/L (ref 38–126)
Anion gap: 5 (ref 5–15)
BUN: 18 mg/dL (ref 8–23)
CO2: 27 mmol/L (ref 22–32)
Calcium: 8.4 mg/dL — ABNORMAL LOW (ref 8.9–10.3)
Chloride: 103 mmol/L (ref 98–111)
Creatinine, Ser: 0.53 mg/dL — ABNORMAL LOW (ref 0.61–1.24)
GFR, Estimated: >60 mL/min (ref >60)
Glucose, Bld: 129 mg/dL — ABNORMAL HIGH (ref 70–99)
Potassium: 4.1 mmol/L (ref 3.5–5.1)
Sodium: 135 mmol/L (ref 135–145)
Total Bilirubin: 0.6 mg/dL (ref 0.3–1.2)
Total Protein: 5.7 g/dL — ABNORMAL LOW (ref 6.5–8.1)

## 2021-01-25 LAB — GLUCOSE, CAPILLARY
Glucose-Capillary: 115 mg/dL — ABNORMAL HIGH (ref 70–99)
Glucose-Capillary: 120 mg/dL — ABNORMAL HIGH (ref 70–99)
Glucose-Capillary: 122 mg/dL — ABNORMAL HIGH (ref 70–99)
Glucose-Capillary: 122 mg/dL — ABNORMAL HIGH (ref 70–99)
Glucose-Capillary: 126 mg/dL — ABNORMAL HIGH (ref 70–99)

## 2021-01-25 LAB — PHOSPHORUS: Phosphorus: 3.2 mg/dL (ref 2.5–4.6)

## 2021-01-25 LAB — TRIGLYCERIDES: Triglycerides: 62 mg/dL (ref <150)

## 2021-01-25 LAB — MAGNESIUM: Magnesium: 2.1 mg/dL (ref 1.7–2.4)

## 2021-01-25 MED ORDER — TRAVASOL 10 % IV SOLN
INTRAVENOUS | Status: AC
  Filled 2021-01-25: qty 979.2

## 2021-01-25 MED ORDER — METOCLOPRAMIDE HCL 5 MG/ML IJ SOLN
10.0000 mg | Freq: Four times a day (QID) | INTRAMUSCULAR | Status: DC
  Administered 2021-01-25 – 2021-01-30 (×19): 10 mg via INTRAVENOUS
  Filled 2021-01-25 (×19): qty 2

## 2021-01-25 NOTE — Progress Notes (Signed)
PHARMACY - TOTAL PARENTERAL NUTRITION CONSULT NOTE   Indication: Small bowel obstruction   Patient Measurements: Height: 5\' 11"  (180.3 cm) Weight: 55.4 kg (122 lb 2.2 oz) IBW/kg (Calculated) : 75.3 TPN AdjBW (KG): 49 Body mass index is 17.03 kg/m. Usual Weight:   Assessment: 38 yoM presents with N/V, worsening abdominal pain, found to have SBO.  PMH includes rectal adenocarcinoma, recent small bowel perforation s/p small bowel resection 06/03/05, complicated by abscess, and TPN for ileus.  He has been able to tolerate fluids and foods until 2 days ago when he developed persistent vomiting after eating.  NG tube placed for decompression.    Glucose / Insulin: A1c 5.9%. CBGs controlled on mSSI. 4 units in last 24 hrs Electrolytes: K up to 4.1, Mag up to 2.1, Na low/normal at 135, others wnl including CoCa - Goal K > 4 and Mg > 2 for bowel function Renal: SCr and BUN wnl Hepatic: LFTs elevated 11/21: AST 76, ALT 128.  Tbili wnl, AlkPhos wnl, Trig 62 Prealbumin: 18.1 Intake / Output; MIVF: UOP 1600 mL/24hr NGT placed by ENT; clamping trial 11/20, returned to suction, NGO 1850 mls/24 hrs No BM recorded NS at 10 ml/hr GI Imaging: 11/16 CT: consistent with small bowel obstruction, likely secondary to adhesion. 11/18 Abdominal Xray shows persistent partial SBO GI Surgeries / Procedures: 12/04/20 small bowel resection  Central access: PICC placed 11/17 for TPN TPN start date: 11/18  Nutritional Goals: Goal TPN rate 85 mL/hr provides 98 g of protein and 2113 kcals per day  RD Assessment:  Estimated Needs Total Energy Estimated Needs: 2100-2300 Total Protein Estimated Needs: 85-100 grams Total Fluid Estimated Needs: >2.1 L  Current Nutrition:  CLD (11/19) > NPO (11/20) TPN  Plan:  Continue TPN 47mL/hr at 1800 Electrolytes in TPN: Na 75 mEq/L, K 68mEq/L, Ca 44mEq/L, Mg 43mEq/L, and Phos 59mmol/L. Cl:Ac 1:1 Add standard MVI and trace elements to TPN Continue moderate q6h SSI and  adjust as needed  M[facility]F to [facility]  TPN labs on Mon/Thurs  Gretta Arab PharmD, BCPS Clinical Pharmacist [facility] main pharmacy [phone] 01/25/2021 8:02 AM

## 2021-01-25 NOTE — Consult Note (Signed)
   [facility] Inpatient Consult   01/25/2021  TION TSE 1955/10/31 [id number]   [facility] [ACO] Patient: [place] plan  Primary Care Provider:  Leonard Downing, MD  Less than 30 days re-admission noted.  Patient is to be assigned to a [facility] for telephonic post hospital follow up.    Plan: Referral for [facility] plan for disease management and community resource follow up support.       For additional questions or referrals please contact:   Natividad Brood, RN BSN [facility] Liaison  [phone] business mobile phone Toll free office [phone]  Fax number: [phone] [email or web address]

## 2021-01-25 NOTE — Progress Notes (Signed)
Subjective/Chief Complaint: PT with BM yesterday, but had some bloating and some pain NGT back to sx   Objective: Vital signs in last 24 hours: Temp:  [98.1 F (36.7 C)-99.4 F (37.4 C)] 98.5 F (36.9 C) (11/21 0503) Pulse Rate:  [73-88] 88 (11/21 0503) Resp:  [16-18] 18 (11/21 0503) BP: (143-170)/(92-98) 143/97 (11/21 0503) SpO2:  [99 %-100 %] 100 % (11/21 0503) Last BM Date: 01/20/21  Intake/Output from previous day: 11/20 0701 - 11/21 0700 In: 2931.3 [P.O.:840; I.V.:1891.3; IV Piggyback:200] Out: 3450 [Urine:1600; Emesis/NG SWNIOE:7035] Intake/Output this shift: No intake/output data recorded.  General appearance: alert and cooperative GI: soft, non-tender; bowel sounds normal; no masses,  no organomegaly and no rebound/guarding  Lab Results:  Recent Labs    01/24/21 1815 01/25/21 0200  WBC 6.9 6.2  HGB 10.8* 10.6*  HCT 32.7* 32.0*  PLT 193 198   BMET Recent Labs    01/24/21 0325 01/25/21 0200  NA 136 135  K 3.7 4.1  CL 103 103  CO2 26 27  GLUCOSE 118* 129*  BUN 16 18  CREATININE 0.62 0.53*  CALCIUM 8.6* 8.4*   PT/INR No results for input(s): LABPROT, INR in the last 72 hours. ABG No results for input(s): PHART, HCO3 in the last 72 hours.  Invalid input(s): PCO2, PO2  Studies/Results: DG Abd Portable 1V  Result Date: 01/25/2021 CLINICAL DATA:  Abdominal pain EXAM: PORTABLE ABDOMEN - 1 VIEW COMPARISON:  Previous studies including the examination of 01/24/2021 FINDINGS: There is persistent dilation of small-bowel loops measuring up to 4.6 cm. Tip of enteric tube is seen in the region of distal antrum of stomach or pylorus. Gas is present in colon and rectum with no significant dilation. IMPRESSION: No significant interval changes are noted in the high-grade partial small bowel obstruction. Electronically Signed   By: Elmer Picker M.D.   On: 01/25/2021 08:23   DG Abd Portable 1V  Result Date: 01/24/2021 CLINICAL DATA:  Increased  abdominal pain.  Small-bowel obstruction. EXAM: PORTABLE ABDOMEN - 1 VIEW COMPARISON:  January 23, 2021 FINDINGS: An NG tube terminates in the stomach. The small bowel dilatation is similar in the interval consistent with persistent obstruction. IMPRESSION: Persistent small-bowel obstruction. The NG tube remains in the stomach with the side port just below the GE junction. Electronically Signed   By: Dorise Bullion III M.D.   On: 01/24/2021 17:33   DG Abd Portable 1V-Small Bowel Obstruction Protocol-24 hr delay  Result Date: 01/23/2021 CLINICAL DATA:  Small-bowel obstruction EXAM: PORTABLE ABDOMEN - 1 VIEW COMPARISON:  01/22/2021 FINDINGS: Supine frontal view of the abdomen and pelvis demonstrates enteric catheter unchanged. Oral contrast is seen throughout the colon. Persistent distended gas-filled loops of small bowel measuring up to 4 cm in diameter, stable. No masses or abnormal calcifications. IMPRESSION: 1. Persistent gaseous distention of the small bowel consistent with intermittent or incomplete obstruction, as oral contrast has progressed throughout the colon. No change since prior study. Electronically Signed   By: Randa Ngo M.D.   On: 01/23/2021 20:18     Assessment/Plan: SBO Hx ex lap with SBR for focal ischemic perforation by Dr. Zenia Resides on 12/04/20  - keep NGT to sx -start reglan -may order CT Tues if no improvement   FEN - NPO, TPN VTE - SCDs, Lovenox ID - None currently Foley - None   HTN - IV metoprolol PRN while NPO, will place parameters for dia bp and hydralazine   LOS: 5 days    Juan David  01/25/2021  

## 2021-01-26 ENCOUNTER — Inpatient Hospital Stay (HOSPITAL_COMMUNITY): Payer: 59

## 2021-01-26 LAB — COMPREHENSIVE METABOLIC PANEL
ALT: 257 U/L — ABNORMAL HIGH (ref 0–44)
AST: 143 U/L — ABNORMAL HIGH (ref 15–41)
Albumin: 3.2 g/dL — ABNORMAL LOW (ref 3.5–5.0)
Alkaline Phosphatase: 113 U/L (ref 38–126)
Anion gap: 7 (ref 5–15)
BUN: 22 mg/dL (ref 8–23)
CO2: 31 mmol/L (ref 22–32)
Calcium: 9.1 mg/dL (ref 8.9–10.3)
Chloride: 100 mmol/L (ref 98–111)
Creatinine, Ser: 0.69 mg/dL (ref 0.61–1.24)
GFR, Estimated: >60 mL/min (ref >60)
Glucose, Bld: 118 mg/dL — ABNORMAL HIGH (ref 70–99)
Potassium: 4.5 mmol/L (ref 3.5–5.1)
Sodium: 138 mmol/L (ref 135–145)
Total Bilirubin: 0.5 mg/dL (ref 0.3–1.2)
Total Protein: 6.7 g/dL (ref 6.5–8.1)

## 2021-01-26 LAB — GLUCOSE, CAPILLARY
Glucose-Capillary: 127 mg/dL — ABNORMAL HIGH (ref 70–99)
Glucose-Capillary: 139 mg/dL — ABNORMAL HIGH (ref 70–99)
Glucose-Capillary: 95 mg/dL (ref 70–99)

## 2021-01-26 MED ORDER — IOHEXOL 350 MG/ML SOLN
80.0000 mL | Freq: Once | INTRAVENOUS | Status: AC | PRN
  Administered 2021-01-26: 80 mL via INTRAVENOUS

## 2021-01-26 MED ORDER — DIATRIZOATE MEGLUMINE & SODIUM 66-10 % PO SOLN: 450.0000 mL | ORAL | Status: DC

## 2021-01-26 MED ORDER — DIATRIZOATE MEGLUMINE & SODIUM 66-10 % PO SOLN
15.0000 mL | ORAL | Status: AC
  Administered 2021-01-26 (×2): 15 mL via NASOGASTRIC
  Filled 2021-01-26: qty 30

## 2021-01-26 MED ORDER — DIATRIZOATE MEGLUMINE & SODIUM 66-10 % PO SOLN
ORAL | Status: AC
  Filled 2021-01-26: qty 30

## 2021-01-26 MED ORDER — TRAVASOL 10 % IV SOLN
INTRAVENOUS | Status: AC
  Filled 2021-01-26: qty 979.2

## 2021-01-26 NOTE — Progress Notes (Signed)
NGT clamped by provider, diet changed to fulls. Pt ordered full liquid tray from cafeteria. Education provided to pt to go slow with diet advancement. Pt verbalized understanding.

## 2021-01-26 NOTE — TOC Initial Note (Signed)
Transition of Care [facility]) - Initial/Assessment Note   Patient Details  Name: Juan David MRN: [id number] Date of Birth: [birth year]  Transition of Care [facility]) CM/SW Contact:    Sherie Don, LCSW Phone Number: 01/26/2021, 2:51 PM  Clinical Narrative: Readmission checklist completed due to high readmission score. CSW met with patient and wife to complete assessment. Patient was sleeping. Per wife, patient lives at home with her. Prior to admission, patient was independent with ADLs at baseline. Patient is able to afford his medications each month. Patient does not have any DME at home and has no history of [place] services. Patient and his wife transport him to medical appointments. TOC to follow for possible discharge needs.  Expected Discharge Plan: Home/Self Care Barriers to Discharge: Continued Medical Work up  Patient Goals and CMS Choice Choice offered to / list presented to : NA  Expected Discharge Plan and Services Expected Discharge Plan: Home/Self Care In-house Referral: Clinical Social Work Post Acute Care Choice: NA Living arrangements for the past 2 months: Single Family Home           DME Arranged: N/A DME Agency: NA  Prior Living Arrangements/Services Living arrangements for the past 2 months: Single Family Home Lives with:: Spouse Patient language and need for interpreter reviewed:: Yes Do you feel safe going back to the place where you live?: Yes      Need for Family Participation in Patient Care: No (Comment) Care giver support system in place?: Yes (comment) Criminal Activity/Legal Involvement Pertinent to Current Situation/Hospitalization: No - Comment as needed  Activities of Daily Living Home Assistive Devices/Equipment: None ADL Screening (condition at time of admission) Patient's cognitive ability adequate to safely complete daily activities?: Yes Is the patient deaf or have difficulty hearing?: No Does the patient have difficulty seeing, even when wearing  glasses/contacts?: Yes Does the patient have difficulty concentrating, remembering, or making decisions?: No Patient able to express need for assistance with ADLs?: Yes Does the patient have difficulty dressing or bathing?: No Independently performs ADLs?: Yes (appropriate for developmental age) Does the patient have difficulty walking or climbing stairs?: No Weakness of Legs: None Weakness of Arms/Hands: None  Emotional Assessment Appearance:: Appears stated age Attitude/Demeanor/Rapport: Lethargic Alcohol / Substance Use: Tobacco Use  Admission diagnosis:  Small bowel obstruction ([place]) [K56.609] SBO (small bowel obstruction) ([place]) [K56.609] Encounter for imaging study to confirm nasogastric (NG) tube placement [Z01.89] Patient Active Problem List   Diagnosis Date Noted   SBO (small bowel obstruction) ([place]) 01/20/2021   History of exploratory laparotomy 12/21/2020   Protein-calorie malnutrition, severe 12/21/2020   Small bowel perforation ([place]) 12/04/2020   Rectal adenocarcinoma ([place]) 09/28/2016   PCP:  Leonard Downing, MD Pharmacy:   [facility] [address] [place] [address] Phone: [phone] Fax: [phone]  Readmission Risk Interventions Readmission Risk Prevention Plan 01/26/2021  Transportation Screening Complete  HRI or Home Care Consult Complete  Social Work Consult for [place] Planning/Counseling Complete  Palliative Care Screening Not Applicable  Medication Review Press photographer) Complete  Some recent data might be hidden

## 2021-01-26 NOTE — Progress Notes (Signed)
   Subjective/Chief Complaint: Pt doing well this AM Some flatus, no BM No bloating   Objective: Vital signs in last 24 hours: Temp:  [98 F (36.7 C)-98.8 F (37.1 C)] 98.8 F (37.1 C) (11/22 0526) Pulse Rate:  [82-95] 90 (11/22 0526) Resp:  [18] 18 (11/22 0526) BP: (133-142)/(86-91) 133/86 (11/22 0526) SpO2:  [98 %-99 %] 99 % (11/22 0526) Last BM Date: 01/20/21  Intake/Output from previous day: 11/21 0701 - 11/22 0700 In: 2212.8 [I.V.:2212.8] Out: 3200 [Urine:800; Emesis/NG output:2400] Intake/Output this shift: Total I/O In: -  Out: 200 [Urine:200]  PE:  Constitutional: No acute distress, conversant, appears states age. Eyes: Anicteric sclerae, moist conjunctiva, no lid lag Lungs: Clear to auscultation bilaterally, normal respiratory effort CV: regular rate and rhythm, no murmurs, no peripheral edema, pedal pulses 2+ GI: Soft, no masses or hepatosplenomegaly, non-tender to palpation, no rebound/guarding Skin: No rashes, palpation reveals normal turgor Psychiatric: appropriate judgment and insight, oriented to person, place, and time   Lab Results:  Recent Labs    01/24/21 1815 01/25/21 0200  WBC 6.9 6.2  HGB 10.8* 10.6*  HCT 32.7* 32.0*  PLT 193 198   BMET Recent Labs    01/24/21 0325 01/25/21 0200  NA 136 135  K 3.7 4.1  CL 103 103  CO2 26 27  GLUCOSE 118* 129*  BUN 16 18  CREATININE 0.62 0.53*  CALCIUM 8.6* 8.4*   PT/INR No results for input(s): LABPROT, INR in the last 72 hours. ABG No results for input(s): PHART, HCO3 in the last 72 hours.  Invalid input(s): PCO2, PO2  Studies/Results: DG Abd Portable 1V  Result Date: 01/25/2021 CLINICAL DATA:  Abdominal pain EXAM: PORTABLE ABDOMEN - 1 VIEW COMPARISON:  Previous studies including the examination of 01/24/2021 FINDINGS: There is persistent dilation of small-bowel loops measuring up to 4.6 cm. Tip of enteric tube is seen in the region of distal antrum of stomach or pylorus. Gas is  present in colon and rectum with no significant dilation. IMPRESSION: No significant interval changes are noted in the high-grade partial small bowel obstruction. Electronically Signed   By: Elmer Picker M.D.   On: 01/25/2021 08:23   DG Abd Portable 1V  Result Date: 01/24/2021 CLINICAL DATA:  Increased abdominal pain.  Small-bowel obstruction. EXAM: PORTABLE ABDOMEN - 1 VIEW COMPARISON:  January 23, 2021 FINDINGS: An NG tube terminates in the stomach. The small bowel dilatation is similar in the interval consistent with persistent obstruction. IMPRESSION: Persistent small-bowel obstruction. The NG tube remains in the stomach with the side port just below the GE junction. Electronically Signed   By: Dorise Bullion III M.D.   On: 01/24/2021 17:33       Assessment/Plan: SBO Hx ex lap with SBR for focal ischemic perforation by Dr. Zenia Resides on 12/04/20  - keep NGT clamped and start fulls -start reglan -may order CT if n/v/bloating after PO   FEN - FLD, TPN VTE - SCDs, Lovenox ID - None currently Foley - None   HTN - IV hydralazine  LOS: 6 days    Juan David 01/26/2021

## 2021-01-26 NOTE — Progress Notes (Signed)
PHARMACY - TOTAL PARENTERAL NUTRITION CONSULT NOTE   Indication: Small bowel obstruction   Patient Measurements: Height: 5\' 11"  (180.3 cm) Weight: 55.4 kg (122 lb 2.2 oz) IBW/kg (Calculated) : 75.3 TPN AdjBW (KG): 49 Body mass index is 17.03 kg/m. Usual Weight:   Assessment: 7 yoM presents with N/V, worsening abdominal pain, found to have SBO.  PMH includes rectal adenocarcinoma, recent small bowel perforation s/p small bowel resection 2/70/78, complicated by abscess, and TPN for ileus.  He has been able to tolerate fluids and foods until 2 days ago when he developed persistent vomiting after eating.  NG tube placed for decompression.    Glucose / Insulin: A1c 5.9%. CBGs controlled on mSSI. 4 units in last 24 hrs Electrolytes: No new labs, previous elytes wnl.  (K 4.1, Mag 2.1) - Goal K > 4 and Mg > 2 Renal: SCr and BUN wnl Hepatic: LFTs elevated 11/21: AST 76, ALT 128.  Tbili wnl, AlkPhos wnl, Trig 62 Prealbumin: 18.1 Intake / Output; MIVF: NS at 10 ml/hr; UOP down to 800 mL/24hr  - NGT placed by ENT; clamping trial 11/20, returned to suction, NGO 2400 mls/24 hrs - BM recorded x1 11/21 GI Imaging: 11/16 CT: consistent with small bowel obstruction, likely secondary to adhesion. 11/18 Abdominal Xray shows persistent partial SBO 11/21 XR: high-grade partial small bowel obstruction. GI Surgeries / Procedures: 12/04/20 small bowel resection  Central access: PICC placed 11/17 for TPN TPN start date: 11/18  Nutritional Goals: Goal TPN rate 85 mL/hr provides 98 g of protein and 2113 kcals per day  RD Assessment:  (11/17) Estimated Needs Total Energy Estimated Needs: 2100-2300 Total Protein Estimated Needs: 85-100 grams Total Fluid Estimated Needs: >2.1 L  Current Nutrition:  CLD (11/19) > NPO (11/20) TPN  Plan:  Continue TPN 45mL/hr at 1800 Electrolytes in TPN: Na 75 mEq/L, K 59mEq/L, Ca 14mEq/L, Mg 20mEq/L, and Phos 71mmol/L. Cl:Ac 1:1 Add standard MVI and trace elements to  TPN Decrease to CBGs and moderate SSI Q8h and adjust as needed  MIVF at [place] labs on Mon/Thurs; CMET tomorrow AM to follow LFTs F/u enteral diet orders, reglan started 11/21  Gretta Arab PharmD, BCPS Clinical Pharmacist [facility] main pharmacy [phone] 01/26/2021 8:07 AM

## 2021-01-27 ENCOUNTER — Inpatient Hospital Stay (HOSPITAL_COMMUNITY): Payer: 59

## 2021-01-27 LAB — COMPREHENSIVE METABOLIC PANEL
ALT: 227 U/L — ABNORMAL HIGH (ref 0–44)
AST: 99 U/L — ABNORMAL HIGH (ref 15–41)
Albumin: 3 g/dL — ABNORMAL LOW (ref 3.5–5.0)
Alkaline Phosphatase: 100 U/L (ref 38–126)
Anion gap: 5 (ref 5–15)
BUN: 21 mg/dL (ref 8–23)
CO2: 28 mmol/L (ref 22–32)
Calcium: 9 mg/dL (ref 8.9–10.3)
Chloride: 102 mmol/L (ref 98–111)
Creatinine, Ser: 0.41 mg/dL — ABNORMAL LOW (ref 0.61–1.24)
GFR, Estimated: >60 mL/min (ref >60)
Glucose, Bld: 113 mg/dL — ABNORMAL HIGH (ref 70–99)
Potassium: 4.2 mmol/L (ref 3.5–5.1)
Sodium: 135 mmol/L (ref 135–145)
Total Bilirubin: 0.3 mg/dL (ref 0.3–1.2)
Total Protein: 6.2 g/dL — ABNORMAL LOW (ref 6.5–8.1)

## 2021-01-27 LAB — GLUCOSE, CAPILLARY
Glucose-Capillary: 104 mg/dL — ABNORMAL HIGH (ref 70–99)
Glucose-Capillary: 109 mg/dL — ABNORMAL HIGH (ref 70–99)
Glucose-Capillary: 114 mg/dL — ABNORMAL HIGH (ref 70–99)
Glucose-Capillary: 114 mg/dL — ABNORMAL HIGH (ref 70–99)
Glucose-Capillary: 119 mg/dL — ABNORMAL HIGH (ref 70–99)

## 2021-01-27 MED ORDER — TRAVASOL 10 % IV SOLN
INTRAVENOUS | Status: AC
  Filled 2021-01-27: qty 979.2

## 2021-01-27 MED ORDER — SODIUM CHLORIDE 0.9 % IV SOLN
12.5000 mg | Freq: Four times a day (QID) | INTRAVENOUS | Status: DC | PRN
  Filled 2021-01-27: qty 0.5

## 2021-01-27 MED ORDER — ENSURE ENLIVE PO LIQD: 237.0000 mL | Freq: Two times a day (BID) | ORAL | Status: DC

## 2021-01-27 MED ORDER — ERYTHROMYCIN BASE 250 MG PO TABS
250.0000 mg | ORAL_TABLET | Freq: Three times a day (TID) | ORAL | Status: DC
  Administered 2021-01-28 – 2021-01-30 (×6): 250 mg via ORAL
  Filled 2021-01-27 (×9): qty 1

## 2021-01-27 NOTE — Plan of Care (Signed)

## 2021-01-27 NOTE — Progress Notes (Signed)
   01/27/21 1300  Mobility  Activity Refused mobility   Pt refused mobility today. Stated he just received pain medication and does not feel up to it today. Will check back later this week.  [facility] Specialist Acute Rehab Services Office: [phone]

## 2021-01-27 NOTE — Progress Notes (Signed)
Nutrition Follow-up  DOCUMENTATION CODES:  Severe malnutrition in context of acute illness/injury, Underweight  INTERVENTION:  Recommend continuing TPN per Pharmacy until patient consistently consumes >50% of a soft diet.  Advance diet as tolerated and as medically able.  Add Ensure Plus High Protein po BID, each supplement provides 350 kcal and 20 grams of protein.   NUTRITION DIAGNOSIS:  Severe Malnutrition related to acute illness (ischemic perforation and now with SBO, likely adhesive) as evidenced by percent weight loss, severe fat depletion, severe muscle depletion. - ongoing  GOAL:  Patient will meet greater than or equal to 90% of their needs. - met with TPN  MONITOR:  I & O's, Labs, Weight trends, Diet advancement  REASON FOR ASSESSMENT:  Malnutrition Screening Tool    ASSESSMENT:  65 yo male with a PMH of HTN, DVT (not listed on anticoagulation) and remote hx of T2 rectal cancer excised via transanal biopsy in 2018 and for which he was treated with chemoradiation and declined to undergo APR who presents with SBO. 11/17 - NGT placed 11/21 - Reglan started 11/22 - NGT clamped  Per Surgery, clamping NGT and starting full liquid diet yesterday. Plan made for CT if any bloating, nausea, or vomiting occurs after PO intake.  Pt currently NPO.  Per Pharmacy note, TPN 49m/hr at 1800 Electrolytes in TPN: Na 75 mEq/L, K 657m/L, Ca 22m9mL, Mg 22mE41m, and Phos 122mm53m. Cl:Ac 1:1 Add standard MVI and trace elements to TPN Decrease to CBGs and moderate SSI Q8h and adjust as needed  M[facility]F at [facility]. [facility] to order Ensure BID to encourage PO intake while on FLD.  Medications: reviewed; SSI, Reglan QID, Protonix, NaCl @ 10 ml/hr   Labs: reviewed; CBG 95-139  Diet Order:   Diet Order             Diet NPO time specified Except for: Ice Chips, Other (See Comments)  Diet effective now                  EDUCATION NEEDS:  Education needs have been addressed  Skin:  Skin  Assessment: Reviewed RN Assessment  Last BM:  01/26/21 - Type 6, medium  Height:  Ht Readings from Last 1 Encounters:  01/20/21 _0  (1.803 m)   Weight:  Wt Readings from Last 1 Encounters:  01/24/21 55.4 kg   BMI:  Body mass index is 17.03 kg/m.  Estimated Nutritional Needs:  Kcal:  2100-2300 Protein:  85-100 grams Fluid:  >2.1 L  MeganDerrel Nip LDN (she/her/hers) Clinical Inpatient Dietitian RD Pager/After-Hours/Weekend Pager # in [facility] [place]

## 2021-01-27 NOTE — Progress Notes (Signed)
PHARMACY - TOTAL PARENTERAL NUTRITION CONSULT NOTE   Indication: Small bowel obstruction   Patient Measurements: Height: 5\' 11"  (180.3 cm) Weight: 55.4 kg (122 lb 2.2 oz) IBW/kg (Calculated) : 75.3 TPN AdjBW (KG): 49 Body mass index is 17.03 kg/m. Usual Weight:   Assessment: 39 yoM presents with N/V, worsening abdominal pain, found to have SBO.  PMH includes rectal adenocarcinoma, recent small bowel perforation s/p small bowel resection 8/41/28, complicated by abscess, and TPN for ileus.  He has been able to tolerate fluids and foods until 2 days ago when he developed persistent vomiting after eating.  NG tube placed for decompression.    Glucose / Insulin: A1c 5.9%. CBGs controlled on mSSI. 4 units in last 24 hrs Electrolytes: Electrolytes wnl,  - Goal K > 4 and Mg > 2 Renal: SCr and BUN wnl Hepatic: LFTs elevated 11/21: AST 99, ALT 227 (decreased from high levels 11/22).  Tbili , AlkPhos wnl, Trig 62 Prealbumin: 18.1 Intake / Output; MIVF: NS at 10 ml/hr; UOP 1300 mL/24hr  - NGT placed by ENT; clamping trial 11/20, returned to suction, NGO 2200 mls/24 hrs - BM recorded 11/22 GI Imaging: 11/16 CT: consistent with small bowel obstruction, likely secondary to adhesion. 11/18 Abdominal Xray shows persistent partial SBO 11/21 XR: high-grade partial small bowel obstruction. GI Surgeries / Procedures: 12/04/20 small bowel resection  Central access: PICC placed 11/17 for TPN TPN start date: 11/18  Nutritional Goals: Goal TPN rate 85 mL/hr provides 98 g of protein and 2113 kcals per day  RD Assessment:  (11/17) Estimated Needs Total Energy Estimated Needs: 2100-2300 Total Protein Estimated Needs: 85-100 grams Total Fluid Estimated Needs: >2.1 L  Current Nutrition:  CLD (11/19) > NPO (11/20) TPN Clamping trial  Plan:  Continue TPN 68mL/hr at 1800 Electrolytes in TPN: Na 75 mEq/L, K 1mEq/L, Ca 37mEq/L, Mg 8mEq/L, and Phos 13mmol/L. Cl:Ac 1:1 Add standard MVI and trace elements  to TPN Decrease to CBGs and moderate SSI Q8h and adjust as needed  MIVF at [place] labs on Mon/Thurs; follow LFTs F/u enteral diet orders, reglan started 11/21  Minda Ditto PharmD [facility] [phone] 01/27/2021, 7:15 AM

## 2021-01-27 NOTE — Progress Notes (Signed)
Subjective/Chief Complaint: Had another bowel movement after contrast and passing flatus but abdominal discomfort and nausea after small amount of soup. Has been ambulatory. No bloating, nausea or pain this am  Objective: Vital signs in last 24 hours: Temp:  [98.5 F (36.9 C)-98.7 F (37.1 C)] 98.5 F (36.9 C) (11/23 0542) Pulse Rate:  [86-94] 94 (11/23 0542) Resp:  [14-18] 18 (11/22 2110) BP: (130-147)/(89-96) 130/91 (11/23 0542) SpO2:  [100 %] 100 % (11/23 0542) Last BM Date: 01/26/21  Intake/Output from previous day: 11/22 0701 - 11/23 0700 In: 1913.9 [P.O.:60; I.V.:1853.9] Out: 3100 [Urine:1600; Emesis/NG output:1500] Intake/Output this shift: Total I/O In: 5 [P.O.:5] Out: 350 [Urine:150; Emesis/NG output:200]  PE:  Constitutional: No acute distress, conversant, appears stated age. Eyes: Anicteric sclerae, moist conjunctiva, no lid lag Lungs: Clear to auscultation bilaterally, normal respiratory effort CV: regular rate and rhythm, no murmurs, no peripheral edema, pedal pulses 2+ GI: Soft, no masses or hepatosplenomegaly, non-tender to palpation, no rebound/guarding. Well healing midline scar. NGT with bilious output Skin: No rashes, palpation reveals normal turgor Psychiatric: appropriate judgment and insight, oriented to person, place, and time   Lab Results:  Recent Labs    01/24/21 1815 01/25/21 0200  WBC 6.9 6.2  HGB 10.8* 10.6*  HCT 32.7* 32.0*  PLT 193 198    BMET Recent Labs    01/26/21 1216 01/27/21 0319  NA 138 135  K 4.5 4.2  CL 100 102  CO2 31 28  GLUCOSE 118* 113*  BUN 22 21  CREATININE 0.69 0.41*  CALCIUM 9.1 9.0    PT/INR No results for input(s): LABPROT, INR in the last 72 hours. ABG No results for input(s): PHART, HCO3 in the last 72 hours.  Invalid input(s): PCO2, PO2  Studies/Results: CT ABDOMEN PELVIS W CONTRAST  Result Date: 01/26/2021 CLINICAL DATA:  Bowel obstruction suspected No additional history provided. EXAM:  CT ABDOMEN AND PELVIS WITH CONTRAST TECHNIQUE: Multidetector CT imaging of the abdomen and pelvis was performed using the standard protocol following bolus administration of intravenous contrast. CONTRAST:  18mL OMNIPAQUE IOHEXOL 350 MG/ML SOLN COMPARISON:  Multiple or prior radiographs, including yesterday. CT 01/20/2021 FINDINGS: Lower chest: Atelectasis in both lung bases. Normal heart size. Enteric tube in place. Hepatobiliary: Stable cyst in the right hepatic dome. No new liver abnormality. Suggestion of mild hepatic steatosis. Gallbladder is physiologically distended. Mild gallbladder wall enhancement with questionable wall thickening, may be related to prolonged fasting. There is no biliary dilatation or calcified gallstone. Pancreas: No ductal dilatation or inflammation. Spleen: Normal in size without focal abnormality. Adrenals/Urinary Tract: No adrenal nodule. No hydronephrosis or perinephric edema. Homogeneous renal enhancement with symmetric excretion on delayed phase imaging. No focal renal abnormality. Urinary bladder is physiologically distended without wall thickening. Stomach/Bowel: Enteric tube tip in the stomach. There is no abnormal gastric distension. Persistent dilated loops of small bowel. Small bowel distention up to 3.5 cm in the pelvis. This is proximal to anastomotic suture line within small bowel. There is no discrete transition point, gradual transition from dilated to nondilated in the distal small bowel. Administered enteric contrast likely reaches the colon, with diluted contrast present in the cecum and ascending colon. There is also small amount of contrast/high-density material within the transverse and descending colon. Dense contrast in the rectosigmoid colon. Sigmoid colonic diverticulosis without focal diverticulitis. The appendix is not confidently visualized, no appendicitis. Vascular/Lymphatic: Advanced aortic and branch atherosclerosis, particularly for age. Atheromatous  plaque is calcified noncalcified with less than 50% luminal narrowing  of the distal aorta. No aneurysm. 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 Limited assessment for adenopathy given presence of ascites and paucity of intra-abdominal fat. No bulky abdominopelvic lymph nodes. Reproductive: Prostate is unremarkable. Other: Small to moderate volume abdominopelvic no mesenteric ascites, increased from prior exam. There is generalized edema of the intra-abdominal fat. No free air or focal fluid collection. Musculoskeletal: L5-S1 degenerative disc disease. There are no acute or suspicious osseous abnormalities. IMPRESSION: 1. Persistent dilated loops of small bowel. No discrete transition point, gradual transition from dilated to nondilated in the distal small bowel. Administered enteric contrast likely reaches the colon, with diluted contrast in the cecum and ascending colon. Findings most consistent with small bowel ileus. 2. Small to moderate volume abdominopelvic ascites, increased from prior exam. Generalized edema of the intra-abdominal fat. 3. Sigmoid colonic diverticulosis without diverticulitis. Aortic Atherosclerosis (ICD10-I70.0). Electronically Signed   By: Keith Rake M.D.   On: 01/26/2021 19:47   DG Abd Portable 1V  Result Date: 01/27/2021 CLINICAL DATA:  SBO. EXAM: PORTABLE ABDOMEN - 1 VIEW COMPARISON:  KUB, 01/25/2021.  CT AP, 01/26/2021. FINDINGS: Support lines: Enteric tube, with tip and side port within stomach. Persistent air-and-fluid distention of small-bowel loops, most prominent at the LEFT upper quadrant. Enteral contrast opacification of colon. IMPRESSION: 1. Well-positioned nasogastric tube. 2. Small bowel obstruction findings, unchanged. Electronically Signed   By: Michaelle Birks M.D.   On: 01/27/2021 08:17       Assessment/Plan: SBO Hx ex lap with SBR for focal ischemic perforation by Dr. Zenia Resides on 12/04/20  - did not tolerate  clamping and FLD yesterday. Continue NGT LIWS today - CT scan 11/22 with dilated loops of small bowel w/o discrete transition point. Diluted contrast in colon - LFTs elevated but improved today - BM yesterday after contrast - cont reglan. Add erythromycin - can have one icy pop per day and okay for suckers/gum - thorazine for hiccups    FEN - NGT LIWS, TPN VTE - SCDs, Lovenox ID - None currently Foley - None   HTN - IV hydralazine   LOS: 7 days    Juan David 01/27/2021

## 2021-01-28 LAB — COMPREHENSIVE METABOLIC PANEL
ALT: 168 U/L — ABNORMAL HIGH (ref 0–44)
AST: 62 U/L — ABNORMAL HIGH (ref 15–41)
Albumin: 3 g/dL — ABNORMAL LOW (ref 3.5–5.0)
Alkaline Phosphatase: 107 U/L (ref 38–126)
Anion gap: 5 (ref 5–15)
BUN: 18 mg/dL (ref 8–23)
CO2: 25 mmol/L (ref 22–32)
Calcium: 8.7 mg/dL — ABNORMAL LOW (ref 8.9–10.3)
Chloride: 101 mmol/L (ref 98–111)
Creatinine, Ser: 0.53 mg/dL — ABNORMAL LOW (ref 0.61–1.24)
GFR, Estimated: >60 mL/min (ref >60)
Glucose, Bld: 108 mg/dL — ABNORMAL HIGH (ref 70–99)
Potassium: 4.5 mmol/L (ref 3.5–5.1)
Sodium: 131 mmol/L — ABNORMAL LOW (ref 135–145)
Total Bilirubin: 0.4 mg/dL (ref 0.3–1.2)
Total Protein: 6.2 g/dL — ABNORMAL LOW (ref 6.5–8.1)

## 2021-01-28 LAB — PHOSPHORUS: Phosphorus: 3.6 mg/dL (ref 2.5–4.6)

## 2021-01-28 LAB — GLUCOSE, CAPILLARY
Glucose-Capillary: 122 mg/dL — ABNORMAL HIGH (ref 70–99)
Glucose-Capillary: 129 mg/dL — ABNORMAL HIGH (ref 70–99)
Glucose-Capillary: 120 mg/dL — ABNORMAL HIGH (ref 70–99)

## 2021-01-28 LAB — MAGNESIUM: Magnesium: 1.8 mg/dL (ref 1.7–2.4)

## 2021-01-28 MED ORDER — TRAVASOL 10 % IV SOLN
INTRAVENOUS | Status: AC
  Filled 2021-01-28: qty 979.2

## 2021-01-28 MED ORDER — MAGNESIUM SULFATE 2 GM/50ML IV SOLN
2.0000 g | Freq: Once | INTRAVENOUS | Status: AC
  Administered 2021-01-28: 2 g via INTRAVENOUS
  Filled 2021-01-28: qty 50

## 2021-01-28 MED ORDER — INSULIN ASPART 100 UNIT/ML IJ SOLN
0.0000 [IU] | Freq: Three times a day (TID) | INTRAMUSCULAR | Status: DC
  Administered 2021-01-29 (×2): 2 [IU] via SUBCUTANEOUS

## 2021-01-28 NOTE — Progress Notes (Signed)
PHARMACY - TOTAL PARENTERAL NUTRITION CONSULT NOTE   Indication: Small bowel obstruction   Patient Measurements: Height: 5\' 11"  (180.3 cm) Weight: 55.4 kg (122 lb 2.2 oz) IBW/kg (Calculated) : 75.3 TPN AdjBW (KG): 49 Body mass index is 17.03 kg/m.   Assessment: 2 yoM presents with N/V, worsening abdominal pain, found to have SBO.  PMH includes rectal adenocarcinoma, recent small bowel perforation s/p small bowel resection 2/29/79, complicated by abscess, and TPN for ileus.  He has been able to tolerate fluids and foods until 2 days ago when he developed persistent vomiting after eating.  NG tube placed for decompression.    Glucose / Insulin: A1c 5.9%. CBGs controlled on mSSI. 1 unit required in last 24 hrs Electrolytes: Sodium low/decreased to 131. Mag 1.8, lower than desired goal. All others WNL.  - Goal K > 4 and Mg > 2 Renal: SCr low. BUN WNL Hepatic: AST/ALT elevated but trending down. Tbili , AlkPhos WNL. Albumin low at 3. Triglycerides 62 (11/21) Prealbumin: 18.1 (11/16) Intake / Output; MIVF: NS at 10 ml/hr; UOP 2050 mL/24hr  - NGT placed by ENT; clamping trial 11/20, returned to suction, NGO 650 mL/24 hrs. May trial clears today with NGT clamped per surgery notes 11/23.  - BM recorded 11/22 GI Imaging: 11/16 CT: consistent with small bowel obstruction, likely secondary to adhesion. 11/18 Abdominal Xray shows persistent partial SBO 11/21 AXR: high-grade partial small bowel obstruction. 11/22 CT: Dilated loops of small bowel w/o discrete transition point. Diluted contrast in colon. Findings consistent with small bowel ileus.  11/23 AXR: small bowel obstruction findings, unchanged GI Surgeries / Procedures: 12/04/20 small bowel resection  Central access: PICC placed 11/17 for TPN TPN start date: 11/18  Nutritional Goals: Goal TPN rate 85 mL/hr provides 98 g of protein and 2113 kcals per day  RD Assessment:  (11/23) Estimated Needs Total Energy Estimated Needs:  2100-2300 Total Protein Estimated Needs: 85-100 grams Total Fluid Estimated Needs: >2.1 L  Current Nutrition:  CLD (11/19) > NPO  TPN   Plan:  Magnesium sulfate 2g IV x 1 now  Continue TPN 53mL/hr at 1800 Electrolytes in TPN: Na 15mEq/L, K 44mEq/L, Ca 79mEq/L, Mg 68mEq/L, and Phos 22mmol/L. Cl:Ac 1:1 Add standard MVI and trace elements to TPN Decrease CBGs and moderate SSI to q8h and adjust as needed  MIVF at [place] labs on Mon/Thurs; follow LFTs CMET, Mag, Phos in AM F/u enteral diet orders, ability to wean TPN   Lindell Spar, PharmD, BCPS [facility] [phone] 01/28/2021, 7:41 AM

## 2021-01-28 NOTE — Progress Notes (Signed)
Assessment & Plan: SBO Hx ex lap with SBR for focal ischemic perforation by Dr. Zenia Resides on 12/04/20  CT scan 11/22 with dilated loops of small bowel w/o discrete transition point On reglan, erythromycin Thorazine for hiccups prn Clamp NG today, trial of clear liquids - discussed with family and nurse Encouraged OOB, ambulation   FEN - NG clamped, CL's, TPN VTE - SCDs, Lovenox ID - None currently Foley - None   HTN - IV hydralazine        Armandina Gemma, MD       [facility]       Office: [phone]   Chief Complaint: SBO  Subjective: Patient in bed, discomfort from NG tube.  Wife at bedside, nurse in room.  Objective: Vital signs in last 24 hours: Temp:  [98.6 F (37 C)-98.8 F (37.1 C)] 98.6 F (37 C) (11/24 0508) Pulse Rate:  [83-90] 90 (11/24 0508) Resp:  [14-18] 18 (11/24 0508) BP: (150-160)/(90-101) 152/101 (11/24 0508) SpO2:  [99 %-100 %] 100 % (11/24 0508) Last BM Date: 01/26/21  Intake/Output from previous day: 11/23 0701 - 11/24 0700 In: 2601.9 [P.O.:65; I.V.:2536.9] Out: 2700 [Urine:2050; Emesis/NG output:650] Intake/Output this shift: No intake/output data recorded.  Physical Exam: HEENT - sclerae clear, mucous membranes moist Neck - soft Abdomen - soft, minimal distension; BS present Neuro - alert & oriented, no focal deficits  Lab Results:  No results for input(s): WBC, HGB, HCT, PLT in the last 72 hours. BMET Recent Labs    01/27/21 0319 01/28/21 0311  NA 135 131*  K 4.2 4.5  CL 102 101  CO2 28 25  GLUCOSE 113* 108*  BUN 21 18  CREATININE 0.41* 0.53*  CALCIUM 9.0 8.7*   PT/INR No results for input(s): LABPROT, INR in the last 72 hours. Comprehensive Metabolic Panel:    Component Value Date/Time   NA 131 (L) 01/28/2021 0311   NA 135 01/27/2021 0319   NA 139 12/27/2016 1237   NA 137 11/21/2016 0928   K 4.5 01/28/2021 0311   K 4.2 01/27/2021 0319   K 4.2 12/27/2016 1237   K 4.0 11/21/2016 0928   CL 101  01/28/2021 0311   CL 102 01/27/2021 0319   CO2 25 01/28/2021 0311   CO2 28 01/27/2021 0319   CO2 25 12/27/2016 1237   CO2 26 11/21/2016 0928   BUN 18 01/28/2021 0311   BUN 21 01/27/2021 0319   BUN 15.2 12/27/2016 1237   BUN 15.5 11/21/2016 0928   CREATININE 0.53 (L) 01/28/2021 0311   CREATININE 0.41 (L) 01/27/2021 0319   CREATININE 1.1 12/27/2016 1237   CREATININE 1.1 11/21/2016 0928   GLUCOSE 108 (H) 01/28/2021 0311   GLUCOSE 113 (H) 01/27/2021 0319   GLUCOSE 112 12/27/2016 1237   GLUCOSE 90 11/21/2016 0928   CALCIUM 8.7 (L) 01/28/2021 0311   CALCIUM 9.0 01/27/2021 0319   CALCIUM 9.7 12/27/2016 1237   CALCIUM 9.8 11/21/2016 0928   AST 62 (H) 01/28/2021 0311   AST 99 (H) 01/27/2021 0319   AST 20 12/27/2016 1237   AST 18 11/21/2016 0928   ALT 168 (H) 01/28/2021 0311   ALT 227 (H) 01/27/2021 0319   ALT 18 12/27/2016 1237   ALT 14 11/21/2016 0928   ALKPHOS 107 01/28/2021 0311   ALKPHOS 100 01/27/2021 0319   ALKPHOS 80 12/27/2016 1237   ALKPHOS 75 11/21/2016 0928   BILITOT 0.4 01/28/2021 0311   BILITOT 0.3 01/27/2021 0319   BILITOT 0.45  12/27/2016 1237   BILITOT 0.41 11/21/2016 0928   PROT 6.2 (L) 01/28/2021 0311   PROT 6.2 (L) 01/27/2021 0319   PROT 7.6 12/27/2016 1237   PROT 7.1 11/21/2016 0928   ALBUMIN 3.0 (L) 01/28/2021 0311   ALBUMIN 3.0 (L) 01/27/2021 0319   ALBUMIN 4.1 12/27/2016 1237   ALBUMIN 3.8 11/21/2016 0928    Studies/Results: CT ABDOMEN PELVIS W CONTRAST  Result Date: 01/26/2021 CLINICAL DATA:  Bowel obstruction suspected No additional history provided. EXAM: CT ABDOMEN AND PELVIS WITH CONTRAST TECHNIQUE: Multidetector CT imaging of the abdomen and pelvis was performed using the standard protocol following bolus administration of intravenous contrast. CONTRAST:  68mL OMNIPAQUE IOHEXOL 350 MG/ML SOLN COMPARISON:  Multiple or prior radiographs, including yesterday. CT 01/20/2021 FINDINGS: Lower chest: Atelectasis in both lung bases. Normal heart size.  Enteric tube in place. Hepatobiliary: Stable cyst in the right hepatic dome. No new liver abnormality. Suggestion of mild hepatic steatosis. Gallbladder is physiologically distended. Mild gallbladder wall enhancement with questionable wall thickening, may be related to prolonged fasting. There is no biliary dilatation or calcified gallstone. Pancreas: No ductal dilatation or inflammation. Spleen: Normal in size without focal abnormality. Adrenals/Urinary Tract: No adrenal nodule. No hydronephrosis or perinephric edema. Homogeneous renal enhancement with symmetric excretion on delayed phase imaging. No focal renal abnormality. Urinary bladder is physiologically distended without wall thickening. Stomach/Bowel: Enteric tube tip in the stomach. There is no abnormal gastric distension. Persistent dilated loops of small bowel. Small bowel distention up to 3.5 cm in the pelvis. This is proximal to anastomotic suture line within small bowel. There is no discrete transition point, gradual transition from dilated to nondilated in the distal small bowel. Administered enteric contrast likely reaches the colon, with diluted contrast present in the cecum and ascending colon. There is also small amount of contrast/high-density material within the transverse and descending colon. Dense contrast in the rectosigmoid colon. Sigmoid colonic diverticulosis without focal diverticulitis. The appendix is not confidently visualized, no appendicitis. Vascular/Lymphatic: Advanced aortic and branch atherosclerosis, particularly for age. Atheromatous plaque is calcified noncalcified with less than 50% luminal narrowing of the distal aorta. No aneurysm. 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 Limited assessment for adenopathy given presence of ascites and paucity of intra-abdominal fat. No bulky abdominopelvic lymph nodes. Reproductive: Prostate is unremarkable. Other: Small to moderate  volume abdominopelvic no mesenteric ascites, increased from prior exam. There is generalized edema of the intra-abdominal fat. No free air or focal fluid collection. Musculoskeletal: L5-S1 degenerative disc disease. There are no acute or suspicious osseous abnormalities. IMPRESSION: 1. Persistent dilated loops of small bowel. No discrete transition point, gradual transition from dilated to nondilated in the distal small bowel. Administered enteric contrast likely reaches the colon, with diluted contrast in the cecum and ascending colon. Findings most consistent with small bowel ileus. 2. Small to moderate volume abdominopelvic ascites, increased from prior exam. Generalized edema of the intra-abdominal fat. 3. Sigmoid colonic diverticulosis without diverticulitis. Aortic Atherosclerosis (ICD10-I70.0). Electronically Signed   By: Keith Rake M.D.   On: 01/26/2021 19:47   DG Abd Portable 1V  Result Date: 01/27/2021 CLINICAL DATA:  SBO. EXAM: PORTABLE ABDOMEN - 1 VIEW COMPARISON:  KUB, 01/25/2021.  CT AP, 01/26/2021. FINDINGS: Support lines: Enteric tube, with tip and side port within stomach. Persistent air-and-fluid distention of small-bowel loops, most prominent at the LEFT upper quadrant. Enteral contrast opacification of colon. IMPRESSION: 1. Well-positioned nasogastric tube. 2. Small bowel obstruction findings, unchanged. Electronically  Signed   By: Michaelle Birks M.D.   On: 01/27/2021 08:17      Armandina Gemma 01/28/2021   Patient ID: Juan David, male   DOB: 1955/10/05, 65 y.o.   MRN: [id number]

## 2021-01-29 LAB — COMPREHENSIVE METABOLIC PANEL
ALT: 133 U/L — ABNORMAL HIGH (ref 0–44)
AST: 45 U/L — ABNORMAL HIGH (ref 15–41)
Albumin: 3.2 g/dL — ABNORMAL LOW (ref 3.5–5.0)
Alkaline Phosphatase: 112 U/L (ref 38–126)
Anion gap: 5 (ref 5–15)
BUN: 18 mg/dL (ref 8–23)
CO2: 27 mmol/L (ref 22–32)
Calcium: 9.1 mg/dL (ref 8.9–10.3)
Chloride: 100 mmol/L (ref 98–111)
Creatinine, Ser: 0.5 mg/dL — ABNORMAL LOW (ref 0.61–1.24)
GFR, Estimated: >60 mL/min (ref >60)
Glucose, Bld: 118 mg/dL — ABNORMAL HIGH (ref 70–99)
Potassium: 4.5 mmol/L (ref 3.5–5.1)
Sodium: 132 mmol/L — ABNORMAL LOW (ref 135–145)
Total Bilirubin: 0.3 mg/dL (ref 0.3–1.2)
Total Protein: 6.9 g/dL (ref 6.5–8.1)

## 2021-01-29 LAB — GLUCOSE, CAPILLARY
Glucose-Capillary: 113 mg/dL — ABNORMAL HIGH (ref 70–99)
Glucose-Capillary: 133 mg/dL — ABNORMAL HIGH (ref 70–99)
Glucose-Capillary: 128 mg/dL — ABNORMAL HIGH (ref 70–99)
Glucose-Capillary: 122 mg/dL — ABNORMAL HIGH (ref 70–99)

## 2021-01-29 LAB — PHOSPHORUS: Phosphorus: 3.9 mg/dL (ref 2.5–4.6)

## 2021-01-29 LAB — MAGNESIUM: Magnesium: 2.2 mg/dL (ref 1.7–2.4)

## 2021-01-29 MED ORDER — TRAVASOL 10 % IV SOLN
INTRAVENOUS | Status: AC
  Filled 2021-01-29: qty 979.2

## 2021-01-29 MED ORDER — MENTHOL 3 MG MT LOZG: 1.0000 | LOZENGE | OROMUCOSAL | Status: DC | PRN

## 2021-01-29 NOTE — Progress Notes (Addendum)
PHARMACY - TOTAL PARENTERAL NUTRITION CONSULT NOTE   Indication: Small bowel obstruction   Patient Measurements: Height: 5\' 11"  (180.3 cm) Weight: 49 kg (108 lb 0.4 oz) IBW/kg (Calculated) : 75.3 TPN AdjBW (KG): 49 Body mass index is 15.07 kg/m.   Assessment: 29 yoM presents with N/V, worsening abdominal pain, found to have SBO.  PMH includes rectal adenocarcinoma, recent small bowel perforation s/p small bowel resection 2/70/78, complicated by abscess, and TPN for ileus.  He has been able to tolerate fluids and foods until 2 days ago when he developed persistent vomiting after eating.  NG tube placed for decompression.    Glucose / Insulin: A1c 5.9%. CBGs controlled. 0 units SSI required in last 24 hrs Electrolytes: Sodium remains low at 132. All others WNL.  - Goal K =/> 4 and Mg =/> 2 Renal: SCr low. BUN WNL Hepatic: AST/ALT elevated but trending down. Tbili , AlkPhos WNL. Albumin low at 3.2. Triglycerides 62 (11/21) Prealbumin: 18.1 (11/16) Intake / Output; MIVF: NS at 10 ml/hr; UOP 2775 mL/24hr  - NGT placed by ENT; clamping trial 11/20, returned to suction, NG clamped 11/24, plan to continue for another 24 hours per CCS.   - BM recorded 11/22 GI Imaging: 11/16 CT: consistent with small bowel obstruction, likely secondary to adhesion. 11/18 Abdominal Xray shows persistent partial SBO 11/21 AXR: high-grade partial small bowel obstruction. 11/22 CT: Dilated loops of small bowel w/o discrete transition point. Diluted contrast in colon. Findings consistent with small bowel ileus.  11/23 AXR: small bowel obstruction findings, unchanged GI Surgeries / Procedures: 12/04/20 small bowel resection  Central access: PICC placed 11/17 for TPN TPN start date: 11/18  Nutritional Goals: Goal TPN rate 85 mL/hr provides 98 g of protein and 2113 kcals per day  RD Assessment:  (11/23) Estimated Needs Total Energy Estimated Needs: 2100-2300 Total Protein Estimated Needs: 85-100 grams Total  Fluid Estimated Needs: >2.1 L  Current Nutrition:  CLD (11/19) > NPO > CLD (11/24) TPN   Plan:  Continue TPN 58mL/hr at 1800 Electrolytes in TPN: Na 145mEq/L, K 42mEq/L, Ca 45mEq/L, Mg 59mEq/L, and Phos 37mmol/L. Cl:Ac 1:1 Add standard MVI and trace elements to TPN Continue CBGs and moderate SSI q8h and adjust as needed  MIVF at [place] labs on Mon/Thurs; follow LFTs CMET, Mag in AM F/u advancement of diet, ability to wean TPN   Lindell Spar, PharmD, BCPS [facility] [phone] 01/29/2021, 8:47 AM

## 2021-01-29 NOTE — Progress Notes (Signed)
Assessment & Plan: SBO Hx ex lap with SBR for focal ischemic perforation by Dr. Zenia Resides on 12/04/20  CT scan 11/22 with dilated loops of small bowel w/o discrete transition point On reglan, erythromycin Thorazine for hiccups prn NG clamped for 24 hours, limited po liquids taken by patient, passing flatus, no BM Continue with NG clamped for another 24 hours Encouraged OOB, ambulation   FEN - NG clamped, CL's, TPN VTE - SCDs, Lovenox ID - None currently Foley - None   HTN - IV hydralazine        Armandina Gemma, MD       [facility]       Office: [phone]   Chief Complaint: SBO  Subjective: Patient in bed, discomfort from NG.  No emesis or nausea.  Objective: Vital signs in last 24 hours: Temp:  [98.6 F (37 C)-99.4 F (37.4 C)] 98.6 F (37 C) (11/25 0559) Pulse Rate:  [94-95] 95 (11/25 0559) Resp:  [14-18] 18 (11/25 0559) BP: (140-162)/(90-99) 145/99 (11/25 0559) SpO2:  [99 %-100 %] 99 % (11/25 0559) Weight:  [49 kg] 49 kg (11/25 0500) Last BM Date: 01/26/21  Intake/Output from previous day: 11/24 0701 - 11/25 0700 In: 2629 [P.O.:540; I.V.:2089] Out: 2775 [Urine:2775] Intake/Output this shift: No intake/output data recorded.  Physical Exam: HEENT - sclerae clear, mucous membranes moist Neck - soft Abdomen - soft, minimal distension; BS present; minimal out of NG with aspiration Ext - no edema, non-tender Neuro - alert & oriented, no focal deficits  Lab Results:  No results for input(s): WBC, HGB, HCT, PLT in the last 72 hours. BMET Recent Labs    01/28/21 0311 01/29/21 0517  NA 131* 132*  K 4.5 4.5  CL 101 100  CO2 25 27  GLUCOSE 108* 118*  BUN 18 18  CREATININE 0.53* 0.50*  CALCIUM 8.7* 9.1   PT/INR No results for input(s): LABPROT, INR in the last 72 hours. Comprehensive Metabolic Panel:    Component Value Date/Time   NA 132 (L) 01/29/2021 0517   NA 131 (L) 01/28/2021 0311   NA 139 12/27/2016 1237   NA 137  11/21/2016 0928   K 4.5 01/29/2021 0517   K 4.5 01/28/2021 0311   K 4.2 12/27/2016 1237   K 4.0 11/21/2016 0928   CL 100 01/29/2021 0517   CL 101 01/28/2021 0311   CO2 27 01/29/2021 0517   CO2 25 01/28/2021 0311   CO2 25 12/27/2016 1237   CO2 26 11/21/2016 0928   BUN 18 01/29/2021 0517   BUN 18 01/28/2021 0311   BUN 15.2 12/27/2016 1237   BUN 15.5 11/21/2016 0928   CREATININE 0.50 (L) 01/29/2021 0517   CREATININE 0.53 (L) 01/28/2021 0311   CREATININE 1.1 12/27/2016 1237   CREATININE 1.1 11/21/2016 0928   GLUCOSE 118 (H) 01/29/2021 0517   GLUCOSE 108 (H) 01/28/2021 0311   GLUCOSE 112 12/27/2016 1237   GLUCOSE 90 11/21/2016 0928   CALCIUM 9.1 01/29/2021 0517   CALCIUM 8.7 (L) 01/28/2021 0311   CALCIUM 9.7 12/27/2016 1237   CALCIUM 9.8 11/21/2016 0928   AST 45 (H) 01/29/2021 0517   AST 62 (H) 01/28/2021 0311   AST 20 12/27/2016 1237   AST 18 11/21/2016 0928   ALT 133 (H) 01/29/2021 0517   ALT 168 (H) 01/28/2021 0311   ALT 18 12/27/2016 1237   ALT 14 11/21/2016 0928   ALKPHOS 112 01/29/2021 0517   ALKPHOS 107 01/28/2021 0311   ALKPHOS  80 12/27/2016 1237   ALKPHOS 75 11/21/2016 0928   BILITOT 0.3 01/29/2021 0517   BILITOT 0.4 01/28/2021 0311   BILITOT 0.45 12/27/2016 1237   BILITOT 0.41 11/21/2016 0928   PROT 6.9 01/29/2021 0517   PROT 6.2 (L) 01/28/2021 0311   PROT 7.6 12/27/2016 1237   PROT 7.1 11/21/2016 0928   ALBUMIN 3.2 (L) 01/29/2021 0517   ALBUMIN 3.0 (L) 01/28/2021 0311   ALBUMIN 4.1 12/27/2016 1237   ALBUMIN 3.8 11/21/2016 0928    Studies/Results: No results found.    Armandina Gemma 01/29/2021   Patient ID: Juan David, male   DOB: Nov 28, 1955, 65 y.o.   MRN: [id number]

## 2021-01-30 LAB — COMPREHENSIVE METABOLIC PANEL
ALT: 105 U/L — ABNORMAL HIGH (ref 0–44)
AST: 36 U/L (ref 15–41)
Albumin: 3.1 g/dL — ABNORMAL LOW (ref 3.5–5.0)
Alkaline Phosphatase: 100 U/L (ref 38–126)
Anion gap: 7 (ref 5–15)
BUN: 21 mg/dL (ref 8–23)
CO2: 25 mmol/L (ref 22–32)
Calcium: 9 mg/dL (ref 8.9–10.3)
Chloride: 101 mmol/L (ref 98–111)
Creatinine, Ser: 0.57 mg/dL — ABNORMAL LOW (ref 0.61–1.24)
GFR, Estimated: >60 mL/min (ref >60)
Glucose, Bld: 119 mg/dL — ABNORMAL HIGH (ref 70–99)
Potassium: 4.3 mmol/L (ref 3.5–5.1)
Sodium: 133 mmol/L — ABNORMAL LOW (ref 135–145)
Total Bilirubin: 0.2 mg/dL — ABNORMAL LOW (ref 0.3–1.2)
Total Protein: 6.6 g/dL (ref 6.5–8.1)

## 2021-01-30 LAB — CBC
HCT: 30.1 % — ABNORMAL LOW (ref 39.0–52.0)
Hemoglobin: 9.8 g/dL — ABNORMAL LOW (ref 13.0–17.0)
MCH: 29.1 pg (ref 26.0–34.0)
MCHC: 32.6 g/dL (ref 30.0–36.0)
MCV: 89.3 fL (ref 80.0–100.0)
Platelets: 260 10*3/uL (ref 150–400)
RBC: 3.37 MIL/uL — ABNORMAL LOW (ref 4.22–5.81)
RDW: 14.1 % (ref 11.5–15.5)
WBC: 8.1 10*3/uL (ref 4.0–10.5)
nRBC: 0 % (ref 0.0–0.2)

## 2021-01-30 LAB — GLUCOSE, CAPILLARY
Glucose-Capillary: 120 mg/dL — ABNORMAL HIGH (ref 70–99)
Glucose-Capillary: 70 mg/dL (ref 70–99)
Glucose-Capillary: 116 mg/dL — ABNORMAL HIGH (ref 70–99)

## 2021-01-30 LAB — MAGNESIUM: Magnesium: 2 mg/dL (ref 1.7–2.4)

## 2021-01-30 MED ORDER — PANTOPRAZOLE SODIUM 40 MG PO TBEC
40.0000 mg | DELAYED_RELEASE_TABLET | Freq: Every day | ORAL | Status: DC
  Administered 2021-01-30 – 2021-02-03 (×5): 40 mg via ORAL
  Filled 2021-01-30 (×5): qty 1

## 2021-01-30 MED ORDER — TRAVASOL 10 % IV SOLN
INTRAVENOUS | Status: AC
  Filled 2021-01-30: qty 979.2

## 2021-01-30 NOTE — Progress Notes (Signed)
Assessment & Plan: SBO Hx ex lap with SBR for focal ischemic perforation by Dr. Zenia Resides on 12/04/20  BM's x 3 yesterday, no emesis Discontinue NG this AM, tolerating clear liquid diet Advance to full liquid diet today, continue TNA for now Encouraged OOB, ambulation   FEN - NG to be discontinued, CL's, TPN VTE - SCDs, Lovenox ID - None currently Foley - None   HTN - IV hydralazine        Armandina Gemma, MD       [facility]       Office: [phone]   Chief Complaint: SBO after small bowel resection  Subjective: Patient in bed, comfortable.  Had multiple BM's yesterday.  Denies nausea, emesis. Tolerating clear liqiud diet with NG clamped.  Objective: Vital signs in last 24 hours: Temp:  [98.3 F (36.8 C)-98.7 F (37.1 C)] 98.3 F (36.8 C) (11/26 0517) Pulse Rate:  [96-100] 96 (11/26 0517) Resp:  [18] 18 (11/26 0517) BP: (121-134)/(82-87) 121/82 (11/26 0517) SpO2:  [98 %-100 %] 98 % (11/26 0517) Weight:  [55.1 kg] 55.1 kg (11/26 0500) Last BM Date: 01/29/21  Intake/Output from previous day: 11/25 0701 - 11/26 0700 In: 2437.4 [P.O.:1380; I.V.:1057.4] Out: 1900 [Urine:1900] Intake/Output this shift: Total I/O In: -  Out: 300 [Urine:300]  Physical Exam: HEENT - sclerae clear, mucous membranes moist Neck - soft Abdomen - softer, non-tender; BS present Ext - no edema, non-tender Neuro - alert & oriented, no focal deficits  Lab Results:  Recent Labs    01/30/21 0318  WBC 8.1  HGB 9.8*  HCT 30.1*  PLT 260   BMET Recent Labs    01/29/21 0517 01/30/21 0318  NA 132* 133*  K 4.5 4.3  CL 100 101  CO2 27 25  GLUCOSE 118* 119*  BUN 18 21  CREATININE 0.50* 0.57*  CALCIUM 9.1 9.0   PT/INR No results for input(s): LABPROT, INR in the last 72 hours. Comprehensive Metabolic Panel:    Component Value Date/Time   NA 133 (L) 01/30/2021 0318   NA 132 (L) 01/29/2021 0517   NA 139 12/27/2016 1237   NA 137 11/21/2016 0928   K 4.3  01/30/2021 0318   K 4.5 01/29/2021 0517   K 4.2 12/27/2016 1237   K 4.0 11/21/2016 0928   CL 101 01/30/2021 0318   CL 100 01/29/2021 0517   CO2 25 01/30/2021 0318   CO2 27 01/29/2021 0517   CO2 25 12/27/2016 1237   CO2 26 11/21/2016 0928   BUN 21 01/30/2021 0318   BUN 18 01/29/2021 0517   BUN 15.2 12/27/2016 1237   BUN 15.5 11/21/2016 0928   CREATININE 0.57 (L) 01/30/2021 0318   CREATININE 0.50 (L) 01/29/2021 0517   CREATININE 1.1 12/27/2016 1237   CREATININE 1.1 11/21/2016 0928   GLUCOSE 119 (H) 01/30/2021 0318   GLUCOSE 118 (H) 01/29/2021 0517   GLUCOSE 112 12/27/2016 1237   GLUCOSE 90 11/21/2016 0928   CALCIUM 9.0 01/30/2021 0318   CALCIUM 9.1 01/29/2021 0517   CALCIUM 9.7 12/27/2016 1237   CALCIUM 9.8 11/21/2016 0928   AST 36 01/30/2021 0318   AST 45 (H) 01/29/2021 0517   AST 20 12/27/2016 1237   AST 18 11/21/2016 0928   ALT 105 (H) 01/30/2021 0318   ALT 133 (H) 01/29/2021 0517   ALT 18 12/27/2016 1237   ALT 14 11/21/2016 0928   ALKPHOS 100 01/30/2021 0318   ALKPHOS 112 01/29/2021 0517   ALKPHOS 80  12/27/2016 1237   ALKPHOS 75 11/21/2016 0928   BILITOT 0.2 (L) 01/30/2021 0318   BILITOT 0.3 01/29/2021 0517   BILITOT 0.45 12/27/2016 1237   BILITOT 0.41 11/21/2016 0928   PROT 6.6 01/30/2021 0318   PROT 6.9 01/29/2021 0517   PROT 7.6 12/27/2016 1237   PROT 7.1 11/21/2016 0928   ALBUMIN 3.1 (L) 01/30/2021 0318   ALBUMIN 3.2 (L) 01/29/2021 0517   ALBUMIN 4.1 12/27/2016 1237   ALBUMIN 3.8 11/21/2016 0928    Studies/Results: No results found.    Armandina Gemma 01/30/2021   Patient ID: Juan David, male   DOB: 1955-06-15, 65 y.o.   MRN: [id number]

## 2021-01-30 NOTE — Progress Notes (Signed)
PHARMACY - TOTAL PARENTERAL NUTRITION CONSULT NOTE   Indication: Small bowel obstruction   Patient Measurements: Height: _0  (180.3 cm) Weight: 55.1 kg (121 lb 7.6 oz) IBW/kg (Calculated) : 75.3 TPN AdjBW (KG): 49 Body mass index is 16.94 kg/m.   Assessment: 55 yoM presents with N/V, worsening abdominal pain, found to have SBO.  PMH includes rectal adenocarcinoma, recent small bowel perforation s/p small bowel resection 6/44/03, complicated by abscess, and TPN for ileus.  He has been able to tolerate fluids and foods until 2 days ago when he developed persistent vomiting after eating.  NG tube placed for decompression.    Glucose / Insulin: A1c 5.9%. CBGs controlled. 4 units SSI required in last 24 hrs Electrolytes: Sodium remains low but improved to 133. All others WNL.  - Goal K =/> 4 and Mg =/> 2 Renal: SCr low. BUN WNL Hepatic: AST improved to WNL. ALT elevated but trending down. Tbili low. Alk Phos WNL. Albumin low at 3.1. Triglycerides 62 (11/21) Prealbumin: 18.1 (11/16) Intake / Output; MIVF: NS at 10 ml/hr; UOP 1900 mL/24hr  - NGT placed by ENT; clamping trial 11/20, returned to suction, NG clamped 11/24, discontinue this AM per MD.   - BM recorded 11/22 GI Imaging: 11/16 CT: consistent with small bowel obstruction, likely secondary to adhesion. 11/18 Abdominal Xray shows persistent partial SBO 11/21 AXR: high-grade partial small bowel obstruction. 11/22 CT: Dilated loops of small bowel w/o discrete transition point. Diluted contrast in colon. Findings consistent with small bowel ileus.  11/23 AXR: small bowel obstruction findings, unchanged GI Surgeries / Procedures: 12/04/20 small bowel resection  Central access: PICC placed 11/17 for TPN TPN start date: 11/18  Nutritional Goals: Goal TPN rate 85 mL/hr provides 98 g of protein and 2113 kcals per day  RD Assessment:  (11/23) Estimated Needs Total Energy Estimated Needs: 2100-2300 Total Protein Estimated Needs:  85-100 grams Total Fluid Estimated Needs: >2.1 L  Current Nutrition:  CLD (11/19) > NPO > CLD (11/24) > FLD (11/26) TPN   Plan:  Continue TPN 23m/hr at 1800 Electrolytes in TPN: Na 1552m/L, K 5063mL, Ca 5mE18m, Mg 7mEq23m and Phos 15mmo89m Cl:Ac 1:1 Add standard MVI and trace elements to TPN Continue CBGs and moderate SSI q8h and adjust as needed  [place] follow LFTs F/u advancement of diet, ability to wean TPN   Ciela Mahajan Lindell SparmD, BCPS [facility] [phone]/2022, 9:54 AM

## 2021-01-30 NOTE — Progress Notes (Signed)
   01/30/21 1310  Mobility  Activity Refused mobility   Pt refuses mobility specialist services at this time. Pt states he does not need help ambulating.  [place] Specialist Acute Rehabilitation Services Phone: [phone] 01/30/21, 1:12 PM

## 2021-01-31 LAB — GLUCOSE, CAPILLARY
Glucose-Capillary: 108 mg/dL — ABNORMAL HIGH (ref 70–99)
Glucose-Capillary: 109 mg/dL — ABNORMAL HIGH (ref 70–99)
Glucose-Capillary: 123 mg/dL — ABNORMAL HIGH (ref 70–99)

## 2021-01-31 MED ORDER — TRAVASOL 10 % IV SOLN
INTRAVENOUS | Status: DC
  Filled 2021-01-31: qty 979.2

## 2021-01-31 NOTE — Progress Notes (Signed)
Assessment & Plan: SBO Hx ex lap with SBR for focal ischemic perforation by Dr. Zenia Resides on 12/04/20  BM's x 1 yesterday, no emesis Tolerating full liquids, hungry, wants to try soft diet today Advance to soft diet today, continue TNA for now Encouraged OOB, ambulation   FEN - soft diet, TPN VTE - SCDs, Lovenox ID - None currently Foley - None        Armandina Gemma, MD       [facility]       Office: [phone]   Chief Complaint: SBO  Subjective: Patient with some left sided abd pain this AM, now improved.  Passing flatus, hungry.  Objective: Vital signs in last 24 hours: Temp:  [97.6 F (36.4 C)-99 F (37.2 C)] 98.1 F (36.7 C) (11/27 0553) Pulse Rate:  [84-98] 84 (11/27 0553) Resp:  [16] 16 (11/27 0553) BP: (122-126)/(75-88) 124/75 (11/27 0553) SpO2:  [100 %] 100 % (11/27 0553) Weight:  [49.6 kg] 49.6 kg (11/27 0553) Last BM Date: 01/30/21  Intake/Output from previous day: 11/26 0701 - 11/27 0700 In: 2150.2 [P.O.:950; I.V.:1200.2] Out: 800 [Urine:800] Intake/Output this shift: Total I/O In: -  Out: 475 [Urine:475]  Physical Exam: HEENT - sclerae clear, mucous membranes moist Neck - soft Abdomen - soft, mild distension; mild tenderness palpation left mid abd, no mass Ext - no edema, non-tender Neuro - alert & oriented, no focal deficits  Lab Results:  Recent Labs    01/30/21 0318  WBC 8.1  HGB 9.8*  HCT 30.1*  PLT 260   BMET Recent Labs    01/29/21 0517 01/30/21 0318  NA 132* 133*  K 4.5 4.3  CL 100 101  CO2 27 25  GLUCOSE 118* 119*  BUN 18 21  CREATININE 0.50* 0.57*  CALCIUM 9.1 9.0   PT/INR No results for input(s): LABPROT, INR in the last 72 hours. Comprehensive Metabolic Panel:    Component Value Date/Time   NA 133 (L) 01/30/2021 0318   NA 132 (L) 01/29/2021 0517   NA 139 12/27/2016 1237   NA 137 11/21/2016 0928   K 4.3 01/30/2021 0318   K 4.5 01/29/2021 0517   K 4.2 12/27/2016 1237   K 4.0 11/21/2016  0928   CL 101 01/30/2021 0318   CL 100 01/29/2021 0517   CO2 25 01/30/2021 0318   CO2 27 01/29/2021 0517   CO2 25 12/27/2016 1237   CO2 26 11/21/2016 0928   BUN 21 01/30/2021 0318   BUN 18 01/29/2021 0517   BUN 15.2 12/27/2016 1237   BUN 15.5 11/21/2016 0928   CREATININE 0.57 (L) 01/30/2021 0318   CREATININE 0.50 (L) 01/29/2021 0517   CREATININE 1.1 12/27/2016 1237   CREATININE 1.1 11/21/2016 0928   GLUCOSE 119 (H) 01/30/2021 0318   GLUCOSE 118 (H) 01/29/2021 0517   GLUCOSE 112 12/27/2016 1237   GLUCOSE 90 11/21/2016 0928   CALCIUM 9.0 01/30/2021 0318   CALCIUM 9.1 01/29/2021 0517   CALCIUM 9.7 12/27/2016 1237   CALCIUM 9.8 11/21/2016 0928   AST 36 01/30/2021 0318   AST 45 (H) 01/29/2021 0517   AST 20 12/27/2016 1237   AST 18 11/21/2016 0928   ALT 105 (H) 01/30/2021 0318   ALT 133 (H) 01/29/2021 0517   ALT 18 12/27/2016 1237   ALT 14 11/21/2016 0928   ALKPHOS 100 01/30/2021 0318   ALKPHOS 112 01/29/2021 0517   ALKPHOS 80 12/27/2016 1237   ALKPHOS 75 11/21/2016 0928   BILITOT  0.2 (L) 01/30/2021 0318   BILITOT 0.3 01/29/2021 0517   BILITOT 0.45 12/27/2016 1237   BILITOT 0.41 11/21/2016 0928   PROT 6.6 01/30/2021 0318   PROT 6.9 01/29/2021 0517   PROT 7.6 12/27/2016 1237   PROT 7.1 11/21/2016 0928   ALBUMIN 3.1 (L) 01/30/2021 0318   ALBUMIN 3.2 (L) 01/29/2021 0517   ALBUMIN 4.1 12/27/2016 1237   ALBUMIN 3.8 11/21/2016 0928    Studies/Results: No results found.    Armandina Gemma 01/31/2021   Patient ID: Juan David, male   DOB: 1956/01/10, 65 y.o.   MRN: [id number]

## 2021-01-31 NOTE — Progress Notes (Signed)
   01/31/21 1300  Mobility  Activity Refused mobility   Pt stated he has been getting up OOB independently. Refused mobility today.  [place] Specialist Acute Rehab Services Office: [phone]

## 2021-01-31 NOTE — Progress Notes (Signed)
PHARMACY - TOTAL PARENTERAL NUTRITION CONSULT NOTE   Indication: Small bowel obstruction   Patient Measurements: Height: '5\' 11"'  (180.3 cm) Weight: 49.6 kg (109 lb 5.6 oz) IBW/kg (Calculated) : 75.3 TPN AdjBW (KG): 49 Body mass index is 15.25 kg/m.   Assessment: 53 yoM presents with N/V, worsening abdominal pain, found to have SBO.  PMH includes rectal adenocarcinoma, recent small bowel perforation s/p small bowel resection 6/43/32, complicated by abscess, and TPN for ileus.  He has been able to tolerate fluids and foods until 2 days ago when he developed persistent vomiting after eating.  NG tube placed for decompression.    Glucose / Insulin: A1c 5.9%. CBGs controlled. 0 units SSI required in last 24 hrs. Electrolytes: Sodium remains low but improved to 133. All others WNL (11/26).  - Goal K =/> 4 and Mg =/> 2 Renal: SCr low. BUN WNL Hepatic: AST improved to WNL. ALT elevated but trending down. Tbili low. Alk Phos WNL. Albumin low at 3.1 (11/26). Triglycerides 62 (11/21) Prealbumin: 18.1 (11/16) Intake / Output; MIVF: NS at 10 ml/hr; UOP not fully quantified (800 mL + 2 occurrences/24hr)  - NGT placed by ENT; clamping trial 11/20, returned to suction, NG clamped 11/24, discontinued 11/26 per MD.   - BM recorded 11/26 GI Imaging: 11/16 CT: consistent with small bowel obstruction, likely secondary to adhesion. 11/18 Abdominal Xray shows persistent partial SBO 11/21 AXR: high-grade partial small bowel obstruction. 11/22 CT: Dilated loops of small bowel w/o discrete transition point. Diluted contrast in colon. Findings consistent with small bowel ileus.  11/23 AXR: small bowel obstruction findings, unchanged GI Surgeries / Procedures: 12/04/20 small bowel resection  Central access: PICC placed 11/17 for TPN TPN start date: 11/18  Nutritional Goals: Goal TPN rate 85 mL/hr provides 98 g of protein and 2113 kcals per day  RD Assessment:  (11/23) Estimated Needs Total Energy Estimated  Needs: 2100-2300 Total Protein Estimated Needs: 85-100 grams Total Fluid Estimated Needs: >2.1 L  Current Nutrition:  CLD (11/19) > NPO > CLD (11/24) > FLD (11/26) TPN   Plan:  Continue TPN 33m/hr at 1800 Electrolytes in TPN: Na 1579m/L, K 5026mL, Ca 5mE79m, Mg 7mEq21m and Phos 15mmo40m Cl:Ac 1:1 Add standard MVI and trace elements to TPN Discontinue CBGs and moderate SSI, as CBGs have remained at goal < 150  [place] follow LFTs F/u advancement of diet, ability to wean TPN   Juan David, BCPS [facility] [phone]/2022, 11:22 AM

## 2021-02-01 ENCOUNTER — Telehealth: Payer: Self-pay | Admitting: Internal Medicine

## 2021-02-01 LAB — MAGNESIUM: Magnesium: 1.8 mg/dL (ref 1.7–2.4)

## 2021-02-01 LAB — COMPREHENSIVE METABOLIC PANEL
ALT: 57 U/L — ABNORMAL HIGH (ref 0–44)
AST: 22 U/L (ref 15–41)
Albumin: 2.9 g/dL — ABNORMAL LOW (ref 3.5–5.0)
Alkaline Phosphatase: 77 U/L (ref 38–126)
Anion gap: 4 — ABNORMAL LOW (ref 5–15)
BUN: 18 mg/dL (ref 8–23)
CO2: 27 mmol/L (ref 22–32)
Calcium: 8.7 mg/dL — ABNORMAL LOW (ref 8.9–10.3)
Chloride: 103 mmol/L (ref 98–111)
Creatinine, Ser: 0.57 mg/dL — ABNORMAL LOW (ref 0.61–1.24)
GFR, Estimated: >60 mL/min (ref >60)
Glucose, Bld: 104 mg/dL — ABNORMAL HIGH (ref 70–99)
Potassium: 4.1 mmol/L (ref 3.5–5.1)
Sodium: 134 mmol/L — ABNORMAL LOW (ref 135–145)
Total Bilirubin: 0.2 mg/dL — ABNORMAL LOW (ref 0.3–1.2)
Total Protein: 6 g/dL — ABNORMAL LOW (ref 6.5–8.1)

## 2021-02-01 LAB — PHOSPHORUS: Phosphorus: 3.8 mg/dL (ref 2.5–4.6)

## 2021-02-01 LAB — TRIGLYCERIDES: Triglycerides: 47 mg/dL (ref <150)

## 2021-02-01 MED ORDER — METHOCARBAMOL 500 MG PO TABS: 500.0000 mg | ORAL_TABLET | Freq: Three times a day (TID) | ORAL | Status: DC | PRN

## 2021-02-01 MED ORDER — HYDROCHLOROTHIAZIDE 25 MG PO TABS
25.0000 mg | ORAL_TABLET | Freq: Every day | ORAL | Status: DC
  Administered 2021-02-01 – 2021-02-03 (×3): 25 mg via ORAL
  Filled 2021-02-01 (×3): qty 1

## 2021-02-01 MED ORDER — ACETAMINOPHEN 500 MG PO TABS
1000.0000 mg | ORAL_TABLET | Freq: Four times a day (QID) | ORAL | Status: DC
  Administered 2021-02-01 – 2021-02-03 (×6): 1000 mg via ORAL
  Filled 2021-02-01 (×6): qty 2

## 2021-02-01 MED ORDER — TRAMADOL HCL 50 MG PO TABS
50.0000 mg | ORAL_TABLET | Freq: Four times a day (QID) | ORAL | Status: DC | PRN
  Administered 2021-02-01 – 2021-02-02 (×3): 50 mg via ORAL
  Filled 2021-02-01 (×3): qty 1

## 2021-02-01 MED ORDER — TRAVASOL 10 % IV SOLN: INTRAVENOUS | Status: AC

## 2021-02-01 NOTE — Telephone Encounter (Signed)
Pt wife states that Pt was admitted to [facility] on 01/20/2021 for a Small Bowel Obstruction. Pt wife states that pt is still admitted at hospital and wants to Know if pt should go ahead and have the EGD/ Colon while he is admitted instead of waiting for 03/16/2021 when he is currently scheduled. Pt wife states that she spoke with Dr. Zenia Resides ( General Surgical Dr. Per pt wife) and that he is stating that the decision be left up to Dr. Carlean Purl if pt should have procedures while admitted. Please advise.

## 2021-02-01 NOTE — Telephone Encounter (Signed)
Inbound call from pt's wife requesting a call back stating that the pt is in the hospital and she has some concerns. Please advise. Thank you.

## 2021-02-01 NOTE — Progress Notes (Signed)
PHARMACY - TOTAL PARENTERAL NUTRITION CONSULT NOTE   Indication: Small bowel obstruction   Patient Measurements: Height: 5\' 11"  (180.3 cm) Weight: 49.9 kg (110 lb 0.2 oz) IBW/kg (Calculated) : 75.3 TPN AdjBW (KG): 49 Body mass index is 15.34 kg/m.   Assessment: 24 yoM presents with N/V, worsening abdominal pain, found to have SBO.  PMH includes rectal adenocarcinoma, recent small bowel perforation s/p small bowel resection 2/42/35, complicated by abscess, and TPN for ileus.  He has been able to tolerate fluids and foods until 2 days ago when he developed persistent vomiting after eating.  NG tube placed for decompression.    Intake / Output; MIVF: NS at 10 ml/hr; UOP not fully quantified (800 mL + 2 occurrences/24hr)  - NGT placed by ENT; clamping trial 11/20, returned to suction, NG clamped 11/24, discontinued 11/26 per MD.   - BM recorded 11/26 - 11/27 transitioned to soft diet tolerating 50-80 % of diet GI Imaging: 11/16 CT: consistent with small bowel obstruction, likely secondary to adhesion. 11/18 Abdominal Xray shows persistent partial SBO 11/21 AXR: high-grade partial small bowel obstruction. 11/22 CT: Dilated loops of small bowel w/o discrete transition point. Diluted contrast in colon. Findings consistent with small bowel ileus.  11/23 AXR: small bowel obstruction findings, unchanged GI Surgeries / Procedures: 12/04/20 small bowel resection  Central access: PICC placed 11/17 for TPN TPN start date: 11/18  Nutritional Goals: Goal TPN rate 85 mL/hr provides 98 g of protein and 2113 kcals per day  RD Assessment:  (11/23) Estimated Needs Total Energy Estimated Needs: 2100-2300 Total Protein Estimated Needs: 85-100 grams Total Fluid Estimated Needs: >2.1 L  Current Nutrition:  CLD (11/19) > NPO > CLD (11/24) > FLD (11/26) > soft diet (11/27) TPN    Plan:  Consult to wean TPN to off today Spoke to RN, will reduce the rate of the TPN to 40 ml/hr for the duration of the  infusion today Pharmacy will sign-off Thank you for the consult   Napoleon Form  02/01/2021, 9:17 AM

## 2021-02-01 NOTE — Telephone Encounter (Signed)
I looked at chart and it looks like he will be going home tomorrow.  The colonoscopy and upper endoscopy are elective and the rounding surgeon indicated it was ok to wait on these.  So I think that is what we will do as at this point would be 2 more days in the hospital.

## 2021-02-01 NOTE — Progress Notes (Signed)
   Subjective/Chief Complaint: Bowel movements are starting to form up.  Tolerating soft diet with no nausea or vomiting.  C/o some pain this morning that he took morphine for.  Objective: Vital signs in last 24 hours: Temp:  [98 F (36.7 C)-98.4 F (36.9 C)] 98 F (36.7 C) (11/28 0547) Pulse Rate:  [80-86] 86 (11/28 0547) Resp:  [16-17] 16 (11/28 0547) BP: (128-148)/(81-90) 137/84 (11/28 0547) SpO2:  [99 %-100 %] 99 % (11/28 0547) Weight:  [49.9 kg] 49.9 kg (11/28 0547) Last BM Date: 01/31/21  Intake/Output from previous day: 11/27 0701 - 11/28 0700 In: 1601.7 [P.O.:410; I.V.:1191.7] Out: 2100 [Urine:2100] Intake/Output this shift: Total I/O In: -  Out: 500 [Urine:500]  PE: Gen: NAD Abd: soft, essentially nontender, small pinpoint opening left of his wound that had some old dry drainage noted that was removed.  +BS, ND   Lab Results:  Recent Labs    01/30/21 0318  WBC 8.1  HGB 9.8*  HCT 30.1*  PLT 260   BMET Recent Labs    01/30/21 0318 02/01/21 0431  NA 133* 134*  K 4.3 4.1  CL 101 103  CO2 25 27  GLUCOSE 119* 104*  BUN 21 18  CREATININE 0.57* 0.57*  CALCIUM 9.0 8.7*   PT/INR No results for input(s): LABPROT, INR in the last 72 hours. ABG No results for input(s): PHART, HCO3 in the last 72 hours.  Invalid input(s): PCO2, PO2  Studies/Results: No results found.     Assessment/Plan: SBO Hx ex lap with SBR for focal ischemic perforation by Dr. Zenia Resides on 12/04/20  -on a soft diet and tolerating well with normal BMs -wean TNA to off today -if he continues to eat well today, would plan for DC home tomorrow -this was discussed with the patient today.  We discussed the pathology of a bowel obstruction and how this works and why if he is tolerating a diet for several days and having bowel movements, that this shows resolution of this acute problem. -he asked about a colonoscopy this admission and if GI had reached out.  I am not aware they have and  discussed that it is not indicated currently and he could keep his outpatient appointment for this as already scheduled. -RN reported patient still taking some morphine this morning.  Will DC this given he is on a soft diet with anticipated DC soon and transition to scheduled tylenol, robaxin prn, and tramadol prn.    FEN - soft diet, wean TNA to off VTE - SCDs, Lovenox ID - None currently Foley - None   HTN - resume home HCTZ   LOS: 12 days    [name] 02/01/2021

## 2021-02-02 ENCOUNTER — Inpatient Hospital Stay (HOSPITAL_COMMUNITY): Payer: 59

## 2021-02-02 MED ORDER — SODIUM CHLORIDE (PF) 0.9 % IJ SOLN
INTRAMUSCULAR | Status: AC
  Filled 2021-02-02: qty 50

## 2021-02-02 MED ORDER — IOHEXOL 350 MG/ML SOLN
80.0000 mL | Freq: Once | INTRAVENOUS | Status: AC | PRN
  Administered 2021-02-02: 80 mL via INTRAVENOUS

## 2021-02-02 MED ORDER — DIATRIZOATE MEGLUMINE & SODIUM 66-10 % PO SOLN
ORAL | Status: AC
  Filled 2021-02-02: qty 30

## 2021-02-02 MED ORDER — DIATRIZOATE MEGLUMINE & SODIUM 66-10 % PO SOLN
15.0000 mL | ORAL | Status: AC
  Administered 2021-02-02 (×2): 15 mL via ORAL
  Filled 2021-02-02: qty 30

## 2021-02-02 NOTE — Plan of Care (Signed)
Pt rested during overnight. Received 1 dose of tramadol prn for pain. Vitals stable. Alert and oriented. Med compliant.  Problem: Education: Goal: Knowledge of General Education information will improve Description: Including pain rating scale, medication(s)/side effects and non-pharmacologic comfort measures Outcome: Progressing   Problem: Health Behavior/Discharge Planning: Goal: Ability to manage health-related needs will improve Outcome: Progressing   Problem: Clinical Measurements: Goal: Ability to maintain clinical measurements within normal limits will improve Outcome: Progressing Goal: Will remain free from infection Outcome: Progressing Goal: Diagnostic test results will improve Outcome: Progressing Goal: Respiratory complications will improve Outcome: Progressing Goal: Cardiovascular complication will be avoided Outcome: Progressing   Problem: Activity: Goal: Risk for activity intolerance will decrease Outcome: Progressing   Problem: Nutrition: Goal: Adequate nutrition will be maintained Outcome: Progressing   Problem: Coping: Goal: Level of anxiety will decrease Outcome: Progressing   Problem: Elimination: Goal: Will not experience complications related to bowel motility Outcome: Progressing Goal: Will not experience complications related to urinary retention Outcome: Progressing   Problem: Pain Managment: Goal: General experience of comfort will improve Outcome: Progressing   Problem: Safety: Goal: Ability to remain free from injury will improve Outcome: Progressing   Problem: Skin Integrity: Goal: Risk for impaired skin integrity will decrease Outcome: Progressing

## 2021-02-02 NOTE — Progress Notes (Signed)
   Subjective/Chief Complaint: Patient tolerating what food he is eating well with no nausea.  Still moving his bowels and passing flatus.  Doesn't like the hospital food here so isn't eating a ton.  Objective: Vital signs in last 24 hours: Temp:  [97.6 F (36.4 C)-98.7 F (37.1 C)] 97.6 F (36.4 C) (11/29 0544) Pulse Rate:  [85-86] 85 (11/29 0544) Resp:  [16-18] 18 (11/29 0544) BP: (127-134)/(78-82) 127/82 (11/29 0544) SpO2:  [100 %] 100 % (11/29 0544) Weight:  [50.3 kg] 50.3 kg (11/29 0500) Last BM Date: 01/31/21  Intake/Output from previous day: 11/28 0701 - 11/29 0700 In: 461.3 [P.O.:240; I.V.:221.3] Out: 1650 [Urine:1650] Intake/Output this shift: No intake/output data recorded.  PE: Gen: NAD Abd: soft, nontender,  +BS, ND   Lab Results:  No results for input(s): WBC, HGB, HCT, PLT in the last 72 hours.  BMET Recent Labs    02/01/21 0431  NA 134*  K 4.1  CL 103  CO2 27  GLUCOSE 104*  BUN 18  CREATININE 0.57*  CALCIUM 8.7*   PT/INR No results for input(s): LABPROT, INR in the last 72 hours. ABG No results for input(s): PHART, HCO3 in the last 72 hours.  Invalid input(s): PCO2, PO2  Studies/Results: No results found.     Assessment/Plan: SBO Hx ex lap with SBR for focal ischemic perforation by Dr. Zenia Resides on 12/04/20  -on a soft diet and tolerating well with normal BMs -TNA off. -patient states he is not comfortable going home today despite reassurance that he is meeting all clinical criteria for discharge. -his wife is going to bring him food from home today.  If he tolerates this today, then place for DC home tomorrow.  FEN - soft diet  VTE - SCDs, Lovenox ID - None currently Foley - None   HTN - resume home HCTZ   LOS: 13 days    [name] 02/02/2021

## 2021-02-02 NOTE — Telephone Encounter (Signed)
Notified Pt wife Manuela Schwartz of Dr. Carlean Purl advise and recommendations.  Manuela Schwartz Verbalized understanding with all questions answered.

## 2021-02-03 ENCOUNTER — Other Ambulatory Visit (HOSPITAL_COMMUNITY): Payer: Self-pay

## 2021-02-03 MED ORDER — IBUPROFEN 400 MG PO TABS
800.0000 mg | ORAL_TABLET | Freq: Once | ORAL | Status: AC
  Administered 2021-02-03: 800 mg via ORAL
  Filled 2021-02-03: qty 2

## 2021-02-03 MED ORDER — ACETAMINOPHEN 500 MG PO TABS: 1000.0000 mg | ORAL_TABLET | Freq: Four times a day (QID) | ORAL | 0 refills | Status: AC | PRN

## 2021-02-03 MED ORDER — TRAMADOL HCL 50 MG PO TABS
50.0000 mg | ORAL_TABLET | Freq: Four times a day (QID) | ORAL | 0 refills | Status: DC | PRN
  Filled 2021-02-03: qty 15, 4d supply, fill #0

## 2021-02-03 NOTE — Discharge Summary (Signed)
Patient ID: Juan David [id number] 1955-08-25 65 y.o.  Admit date: 01/20/2021 Discharge date: 02/03/2021  Admitting Diagnosis: SBO  Discharge Diagnosis Patient Active Problem List   Diagnosis Date Noted   SBO (small bowel obstruction) ([place]) 01/20/2021   History of exploratory laparotomy 12/21/2020   Protein-calorie malnutrition, severe 12/21/2020   Small bowel perforation ([place]) 12/04/2020   Rectal adenocarcinoma ([place]) 09/28/2016    Consultants ENT for NGT placement  Reason for Admission: Juan David is a 65 y.o. male who presented to the ED with abdominal pain, n/v. Patient was recently admitted from 9/30 - 10/17 after undergoing ex lap with SBR for focal ischemic perforation by Dr. Zenia Resides on 12/04/20. Post operatively, patient developed ileus that did require TPN. On POD 7 he underwent CT that showed IAA for which he underwent IR drain placement on 10/6. Cx grew E. Coli and Enterococcus for which he was tx with appropriate abx. His ileus resolved and TPN was weaned off.   He underwent repeat CT scan prior to discharge which revealed significantly improved fluid collection and his drain was able to be removed prior to discharge. It appears he is being scheduled for outpatient colonoscopy with GI per chart review.    Patient states he was slowly getting better at home, appetite seemed to be overall improving, until 2 days ago when he developed decreased appetite and distention. Last night at 10 PM he developed significant sharp, non-radiating abdominal pain and profuse vomiting. No flatus or BM since yesterday. In the ED, he was afebrile (T 96.4), without tachycardia or hypotension, WBC 16.8, Lactic 2.8, CT A/P w/ multiple fecalized loops of small bowel with an abrupt caliber change in the low midline abdomen with complete decompression of approximately the distal 30 cm of ileum concerning for SBO. No residual fluid collection at prior perc drain site. We were asked to  see.  Procedures none  Hospital Course:  The patient was admitted and had a very difficult time with NGT placement.  It took ENT to be able to get this placed. Once this was placed he felt much better.  Patient had some waxing and waning symptoms of improvement over the course of the hospital stay.  He was placed on reglan at one point as he was having some bowel function but still remaining bloated.  He was started on TNA for supplemental nutrition due to the prolonged period of NPO status.  The patient finally began to improve with bowel function and decrease bloating symptoms.  His NGT was clamped and his diet was able to be slowly advanced.  His NGT was then removed and his TNA weaned to off.  He remained on a soft/low fiber diet for several days to assure tolerance.  He did have a repeat CT scan prior to discharge due to some abdominal pain after eating.  This showed overall improvement and resolution of his obstruction.  He was stable on HD 14 for DC home.  Physical Exam: Abd: soft, NT, ND, +BS, midline wound healed  Allergies as of 02/03/2021   No Known Allergies      Medication List     STOP taking these medications    Normal Saline Flush 0.9 % Soln   oxyCODONE 5 MG immediate release tablet Commonly known as: Oxy IR/ROXICODONE       TAKE these medications    acetaminophen 500 MG tablet Commonly known as: TYLENOL Take 2 tablets (1,000 mg total) by mouth every 6 (six) hours as  needed.   dicyclomine 20 MG tablet Commonly known as: BENTYL Take 1 tablet (20 mg total) by mouth 3 (three) times daily as needed for spasms (before meals). What changed: when to take this   hydrochlorothiazide 50 MG tablet Commonly known as: HYDRODIURIL Take 1/2 of a tablet by mouth every 24 hours for hypertension. What changed:  how much to take how to take this when to take this   methocarbamol 750 MG tablet Commonly known as: ROBAXIN Take 750 mg by mouth every 6 (six) hours as needed  for muscle spasms.   multivitamin with minerals Tabs tablet Take 1 tablet by mouth daily.   omeprazole 40 MG capsule Commonly known as: PRILOSEC Take 40 mg by mouth daily.   simethicone 80 MG chewable tablet Commonly known as: MYLICON Chew 1 tablet (80 mg total) by mouth 4 (four) times daily as needed for flatulence.   timolol 0.5 % ophthalmic solution Commonly known as: TIMOPTIC Place 1 drop into the left eye every morning.   traMADol 50 MG tablet Commonly known as: ULTRAM Take 1 tablet (50 mg total) by mouth every 6 (six) hours as needed for moderate pain.          Follow-up Information     Dwan Bolt, MD Follow up on 03/02/2021.   Specialty: General Surgery Why: arrive at 9:45am for 10:00am appointment time Contact information: [address] [place] [address] [phone]         Gatha Mayer, MD Follow up.   Specialty: Gastroenterology Why: as previously scheduled Contact information: [address] [address] [phone]                 Signed: Saverio Danker, [facility] 02/03/2021, 11:58 AM Please see Amion for pager number during day hours 7:00am-4:30pm, 7-11:30am on Weekends

## 2021-02-08 ENCOUNTER — Encounter: Payer: Self-pay | Admitting: *Deleted

## 2021-02-08 ENCOUNTER — Other Ambulatory Visit: Payer: Self-pay | Admitting: *Deleted

## 2021-02-08 NOTE — Patient Outreach (Addendum)
Juan David) Care Management Telephonic RN Care Manager Note   02/08/2021 Name:  Juan David MRN:  [id number] DOB:  1955-03-18  Summary: [facility] Transition of care outreach to post surgical patient after small bowel obstruction (SBO-01/20/21) history of exploratory laparotomy 12/21/20, severe protein calorie malnutrition, small bowel perforation 12/04/20, rectal adenocarcinoma 09/28/16  Initial assessment started with wife, Juan David She reports he is sleeping at the time of the call but is doing very well  She reports his appetite is steady improving on his soft/low fiber diet Their follow up appointments are scheduled, pcp appointment per wife is to be only after follow up with surgeon & GI, Dr Wilburn Cornelia on 03/02/21 10 am and Gastroenterologist Dr Carlean Purl on 03/16/21  Denies needs at this time Agrees to transition of care follow up  Recommendations/Changes made from today's visit: Encouraged to outreach to RN CM if any changes- MD for worsening symptoms---wife reports she is a nurse Provided [facility] billing office number to check on concerns about billing as [phone]    Subjective: Juan David is an 65 y.o. year old male who is a primary patient of Leonard Downing, MD. The care management team was consulted for assistance with care management and/or care coordination needs.    Telephonic RN Care Manager completed Telephone Visit today.   Objective:  Medications Reviewed Today     Reviewed by Marcell Barlow, CPhT (Pharmacy Technician) on 01/20/21 at [place] List Status: Complete   Medication Order Taking? Sig Documenting Provider Last Dose Status Informant  dicyclomine (BENTYL) 20 MG tablet [id number] Yes Take 1 tablet (20 mg total) by mouth 3 (three) times daily as needed for spasms (before meals).  Patient taking differently: Take 20 mg by mouth 3 (three) times daily before meals.   Gatha Mayer, MD 01/19/2021 Active Spouse/Significant Other   hydrochlorothiazide (HYDRODIURIL) 50 MG tablet [id number] Yes Take 1/2 of a tablet by mouth every 24 hours for hypertension.  Patient taking differently: Take 25 mg by mouth daily.    01/19/2021 Active Spouse/Significant Other  methocarbamol (ROBAXIN) 750 MG tablet [id number] Yes Take 750 mg by mouth every 6 (six) hours as needed for muscle spasms. [provider] Past Week Active Spouse/Significant Other  Multiple Vitamin (MULTIVITAMIN WITH MINERALS) TABS tablet [id number] Yes Take 1 tablet by mouth daily. [provider] 01/19/2021 Active Spouse/Significant Other  omeprazole (PRILOSEC) 40 MG capsule [id number] Yes Take 40 mg by mouth daily. [provider] 01/19/2021 Active Spouse/Significant Other  oxyCODONE (OXY IR/ROXICODONE) 5 MG immediate release tablet [id number] Yes Take 5 mg by mouth every 6 (six) hours as needed for pain. [provider] Past Week Active Spouse/Significant Other  simethicone (MYLICON) 80 MG chewable tablet [id number] No Chew 1 tablet (80 mg total) by mouth 4 (four) times daily as needed for flatulence.  Patient not taking: Reported on 01/20/2021   Winferd Humphrey, PA-C Not Taking Active Spouse/Significant Other  Sodium Chloride Flush (NORMAL SALINE FLUSH) 0.9 % SOLN [id number]  Flush abscess drain once daily with 5-10 mls Jacqualine Mau, NP  Active Spouse/Significant Other  timolol (TIMOPTIC) 0.5 % ophthalmic solution [id number] Yes Place 1 drop into the left eye every morning.  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   Hypertension    Iron deficiency    Legally blind in left eye, as defined in [place]    per pt only see light   PFO (patent foramen ovale)    per TEE done 05-11-2009    Rectal adenocarcinoma (HCC) 09/28/2016   Traumatic glaucoma, 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 shaina marie rubino at [facility] in [place]   08-18-2001   Wears glasses     SDOH:  (Social Determinants of Health) assessments and interventions performed:  SDOH Interventions    Flowsheet Row Most Recent Value  SDOH Interventions   Food Insecurity Interventions Intervention Not Indicated  Financial Strain Interventions Intervention Not Indicated  Housing Interventions Intervention Not Indicated  Intimate Partner Violence Interventions Intervention Not Indicated  Stress Interventions Intervention Not Indicated  Transportation Interventions Intervention Not Indicated       Care Plan  Review of patient past medical history, allergies, medications, health status, including review of consultants reports, laboratory and other test data, was performed as part of comprehensive evaluation for care management services.   Care Plan : RN Care Manager Plan of Care  Updates made by ,  L, RN since 02/08/2021 12:00 AM     Problem: CHL AMB "PATIENT-SPECIFIC PROBLEM"Complex Care Coordination Needs and disease management in patient with SBO, HTN   Priority: High     Long-Range Goal: Establish Plan of Care for Management Complex SDOH Barriers, disease management and Care Coordination Needs in patient with SBO, HTN   Start Date: 02/08/2021  This Visit's Progress: On track  Priority: High  Note:   Current Barriers:  Knowledge Deficits related to plan of care for management of HTN and SBO  Care Coordination needs related to Limited education about HTN, SBO*  RNCM Clinical Goal(s):  Patient will demonstrate Improved adherence to  prescribed treatment plan for HTN and SBO as evidenced by prevention of re admission  through collaboration with RN Care manager, provider, and care team.   Interventions: Further follow up outreaches for assessment of worsening symptoms, care coordination and disease management needs Inter-disciplinary care team collaboration (see longitudinal plan of care) Evaluation of current treatment plan related to  self management and patient's adherence to plan as established by provider   Hypertension Interventions:  (Status:  New goal.) Long Term Goal Last practice recorded BP readings:  BP Readings from Last 3 Encounters:  02/03/21 122/88  01/18/21 (!) 140/96  12/21/20 138/88  Most recent eGFR/CrCl:  Lab Results  Component Value Date   EGFR >60 12/27/2016    No components found for: CRCL  Assessed social determinant of health barriers  Surgery (SBO-small bowel resection):  (Status: New goal.) Short Term Goal Evaluation of current treatment plan related to  small bowel resection surgery reviewed scheduled provider appointments with patient- surgeon, GI, pcp confirmed availability of transportation to all appointments -surgeon, GI, pcp  Patient Goals/Self-Care Activities: Take all medications as prescribed Attend all scheduled provider appointments Call provider office for new concerns or questions   Follow Up Plan:  The   patient has been provided with contact information for the care management team and has been advised to call with any health related questions or concerns.  The care management team will reach out to the patient again over the next 7-10 business  days.       Plan: The patient has been provided with contact information for the care management team and has been advised to call with any health related questions or concerns.  The care management team will reach out to the patient again over the next 7-10 business  days.   L. , RN, BSN, CCM [facility] Telephonic  Care Management Care Coordinator Office number [phone] Main [facility] number [phone] Fax number [phone]      

## 2021-02-15 ENCOUNTER — Other Ambulatory Visit: Payer: Self-pay | Admitting: *Deleted

## 2021-02-15 ENCOUNTER — Other Ambulatory Visit: Payer: Self-pay

## 2021-02-15 ENCOUNTER — Encounter: Payer: Self-pay | Admitting: *Deleted

## 2021-02-15 NOTE — Patient Outreach (Signed)
[facility]) Care Management Telephonic RN Care Manager Note   02/15/2021 Name:  Juan David MRN:  [id number] DOB:  09/20/1955  Summary: Transitions of care outreach #2 Spoke with Juan David Patient progressing well and denies any needs or worsening symptoms at this time  Recommendations/Changes made from today's visit: Outreach prn for any concerns   Subjective: Juan David is an 65 y.o. year old male who is a primary patient of Leonard Downing, MD. The care management team was consulted for assistance with care management and/or care coordination needs.    Telephonic RN Care Manager completed Telephone Visit today.   Objective:  Medications Reviewed Today     Reviewed by Marcell Barlow, CPhT (Pharmacy Technician) on 01/20/21 at [place] List Status: Complete   Medication Order Taking? Sig Documenting Provider Last Dose Status Informant  dicyclomine (BENTYL) 20 MG tablet [id number] Yes Take 1 tablet (20 mg total) by mouth 3 (three) times daily as needed for spasms (before meals).  Patient taking differently: Take 20 mg by mouth 3 (three) times daily before meals.   Gatha Mayer, MD 01/19/2021 Active Spouse/Significant Other  hydrochlorothiazide (HYDRODIURIL) 50 MG tablet [id number] Yes Take 1/2 of a tablet by mouth every 24 hours for hypertension.  Patient taking differently: Take 25 mg by mouth daily.    01/19/2021 Active Spouse/Significant Other  methocarbamol (ROBAXIN) 750 MG tablet [id number] Yes Take 750 mg by mouth every 6 (six) hours as needed for muscle spasms. [provider] Past Week Active Spouse/Significant Other  Multiple Vitamin (MULTIVITAMIN WITH MINERALS) TABS tablet [id number] Yes Take 1 tablet by mouth daily. [provider] 01/19/2021 Active Spouse/Significant Other  omeprazole (PRILOSEC) 40 MG capsule [id number] Yes Take 40 mg by mouth daily. [provider] 01/19/2021 Active Spouse/Significant Other   oxyCODONE (OXY IR/ROXICODONE) 5 MG immediate release tablet [id number] Yes Take 5 mg by mouth every 6 (six) hours as needed for pain. [provider] Past Week Active Spouse/Significant Other  simethicone (MYLICON) 80 MG chewable tablet [id number] No Chew 1 tablet (80 mg total) by mouth 4 (four) times daily as needed for flatulence.  Patient not taking: Reported on 01/20/2021   Winferd Humphrey, PA-C Not Taking Active Spouse/Significant Other  Sodium Chloride Flush (NORMAL SALINE FLUSH) 0.9 % SOLN [id number]  Flush abscess drain once daily with 5-10 mls Jacqualine Mau, NP  Active Spouse/Significant Other  timolol (TIMOPTIC) 0.5 % ophthalmic solution [id number] Yes Place 1 drop into the left eye every morning.  [provider] 01/19/2021 Active Spouse/Significant Other           Med Note Buford Dresser, XYRISH   Fri Dec 04, 2020 10:55 AM)               SDOH:  (Social Determinants of Health) assessments and interventions performed:  SDOH Interventions    Flowsheet Row Most Recent Value  SDOH Interventions   Food Insecurity Interventions Intervention Not Indicated  Financial Strain Interventions Intervention Not Indicated  Housing Interventions Intervention Not Indicated  Stress Interventions Intervention Not Indicated  Social Connections Interventions Intervention Not Indicated  Transportation Interventions Intervention Not Indicated       Care Plan  Review of patient past medical history, allergies, medications, health status, including review of consultants reports, laboratory and other test data, was performed as part of comprehensive evaluation for care management services.   Care Plan : RN Care Manager Plan of Care  Updates made by Barbaraann Faster, RN  since 02/15/2021 12:00 AM     Problem: CHL AMB "PATIENT-SPECIFIC PROBLEM"Complex Care Coordination Needs and disease management in patient with SBO, HTN   Priority: High     Long-Range Goal: Establish Plan  of Care for Management Complex SDOH Barriers, disease management and Care Coordination Needs in patient with SBO, HTN   Start Date: 02/08/2021  This Visit's Progress: On track  Recent Progress: On track  Priority: High  Note:   Current Barriers:  Knowledge Deficits related to plan of care for management of HTN and SBO  Care Coordination needs related to Limited education about HTN, SBO* 02/15/21 Patient reports improvements gradually in nutrition as he continues to feel full when taking in 4 small meals and ensure during the day but denies nausea and vomiting episodes  RN CM Clinical Goal(s):  Patient will demonstrate Improved adherence to prescribed treatment plan for HTN and SBO as evidenced by prevention of re admission  through collaboration with RN Care manager, provider, and care team.   Interventions: Further follow up outreaches for assessment of worsening symptoms, care coordination and disease management needs Inter-disciplinary care team collaboration (see longitudinal plan of care) Evaluation of current treatment plan related to  self management and patient's adherence to plan as established by provider   Hypertension Interventions:  (Status:  Condition stable.  Not addressed this visit.) Long Term Goal Last practice recorded BP readings:  BP Readings from Last 3 Encounters:  02/03/21 122/88  01/18/21 (!) 140/96  12/21/20 138/88  Most recent eGFR/CrCl:  Lab Results  Component Value Date   EGFR >60 12/27/2016    No components found for: CRCL  Assessed social determinant of health barriers Reviewed [facility] resources SW, RN, NP, transitions of care services to include care coordination and disease management Encouragement provided   Surgery (SBO-small bowel resection):  (Status: Goal on track:  Yes.) Short Term Goal Evaluation of current treatment plan related to  small bowel resection surgery reviewed scheduled provider appointments with patient- surgeon, GI, pcp confirmed  availability of transportation to all appointments -surgeon, GI, pcp Completed nutrition assessment  Patient Goals/Self-Care Activities: Take all medications as prescribed Attend all scheduled provider appointments Perform all self care activities independently  Call provider office for new concerns or questions   Follow Up Plan:  The patient has been provided with contact information for the care management team and has been advised to call with any health related questions or concerns.  The care management team will reach out to the patient again over the next 7-10 business  days.       Plan: The patient has been provided with contact information for the care management team and has been advised to call with any health related questions or concerns.  The care management team will reach out to the patient again over the next 7-10 business  days.  Juan David L. Lavina Hamman, RN, BSN, [place] Coordinator Office number [phone] Main [facility] number [phone] Fax number [phone]

## 2021-02-23 ENCOUNTER — Other Ambulatory Visit: Payer: Self-pay | Admitting: *Deleted

## 2021-02-23 DIAGNOSIS — Z9049 Acquired absence of other specified parts of digestive tract: Secondary | ICD-10-CM | POA: Insufficient documentation

## 2021-02-23 NOTE — Patient Outreach (Signed)
[facility]) Care Management  02/23/2021  Juan David 1955/10/17 [id number]   [facility] Unsuccessful outreach   Outreach attempt to the listed at the preferred outreach number in EPIC  No answer. [facility] RN CM left HIPAA [facility] Portability and Accountability Act) compliant voicemail message along with CMs contact info plus apologies for a delay in the outreach attempt to him   Plan: [place] CM scheduled this patient for another call attempt within 4-7 business days  Unsuccessful outreach on 02/23/21   Tamie Minteer L. Lavina Hamman, RN, BSN, [place] Coordinator Office number [phone] Mobile number [phone]  Main [facility] number [phone] Fax number [phone]

## 2021-02-25 ENCOUNTER — Other Ambulatory Visit: Payer: Self-pay | Admitting: *Deleted

## 2021-02-25 ENCOUNTER — Other Ambulatory Visit: Payer: Self-pay

## 2021-02-25 DIAGNOSIS — Z85038 Personal history of other malignant neoplasm of large intestine: Secondary | ICD-10-CM | POA: Insufficient documentation

## 2021-02-25 NOTE — Patient Outreach (Signed)
[facility]) Care Management Telephonic RN Care Manager Note   02/25/2021 Name:  Juan David MRN:  [id number] DOB:  1955-04-11  Summary: Transition of care outreach week 3 follow up Mr Ohalloran was seen by his Surgeon on 02/23/21 and was given  a work note to remain out of work until 03/22/20 Still not eating well, reports weakness/fatigue, poor sleep (not sleep through night related to GI symptoms/irregular stools) Present weight 112 lb He has filed for Long disability - to go in effect on 03/04/21   Meals were reported increased in portion size on his soft, low fiber diet leading to increase symptoms Pending EGD and colonoscopy on 03/16/20 (Dr Carlean Purl)  Recommendations/Changes made from today's visit: Return to the intake of 4 small meals with ensure prn bid Offered to assist with further nutrition referral but offer denied Encouraged rest throughout the day  Subjective: Juan David is an 65 y.o. year old male who is a primary patient of Leonard Downing, MD. The care management team was consulted for assistance with care management and/or care coordination needs.    Telephonic RN Care Manager completed Telephone Visit today.   Objective:  Medications Reviewed Today     Reviewed by Barbaraann Faster, RN (Registered Nurse) on 02/25/21 at 17  Med List Status: <None>   Medication Order Taking? Sig Documenting Provider Last Dose Status Informant  acetaminophen (TYLENOL) 500 MG tablet [id number] No Take 2 tablets (1,000 mg total) by mouth every 6 (six) hours as needed. Saverio Danker, PA-C Taking Active   dicyclomine (BENTYL) 20 MG tablet [id number] No Take 1 tablet (20 mg total) by mouth 3 (three) times daily as needed for spasms (before meals).  Patient taking differently: Take 20 mg by mouth 3 (three) times daily before meals.   Gatha Mayer, MD 01/19/2021 Active Spouse/Significant Other  hydrochlorothiazide (HYDRODIURIL) 50 MG tablet [id number] No  Take 1/2 of a tablet by mouth every 24 hours for hypertension.  Patient taking differently: Take 25 mg by mouth daily.    01/19/2021 Active Spouse/Significant Other  methocarbamol (ROBAXIN) 750 MG tablet [id number] No Take 750 mg by mouth every 6 (six) hours as needed for muscle spasms. [provider] Past Week Active Spouse/Significant Other  Multiple Vitamin (MULTIVITAMIN WITH MINERALS) TABS tablet [id number] No Take 1 tablet by mouth daily. [provider] Taking Active Spouse/Significant Other  omeprazole (PRILOSEC) 40 MG capsule [id number] No Take 40 mg by mouth daily. [provider] Taking Active Spouse/Significant Other  simethicone (MYLICON) 80 MG chewable tablet [id number] No Chew 1 tablet (80 mg total) by mouth 4 (four) times daily as needed for flatulence.  Patient not taking: Reported on 01/20/2021   Winferd Humphrey, PA-C Not Taking Active Spouse/Significant Other  timolol (TIMOPTIC) 0.5 % ophthalmic solution [id number] No Place 1 drop into the left eye every morning.  [provider] Taking Active Spouse/Significant Other           Med Note Buford Dresser, XYRISH   Fri Dec 04, 2020 10:55 AM)    traMADol (ULTRAM) 50 MG tablet [id number]  Take 1 tablet (50 mg total) by mouth every 6 (six) hours as needed for moderate pain. Saverio Danker, PA-C  Active              SDOH:  (Social Determinants of Health) assessments and interventions performed:    Care Plan  Review of patient past medical history, allergies, medications, health status, including review of consultants reports, laboratory and  other test data, was performed as part of comprehensive evaluation for care management services.   Care Plan : RN Care Manager Plan of Care  Updates made by Barbaraann Faster, RN since 02/25/2021 12:00 AM     Problem: CHL AMB "PATIENT-SPECIFIC PROBLEM"Complex Care Coordination Needs and disease management in patient with SBO, HTN   Priority: High     Long-Range  Goal: Establish Plan of Care for Management Complex SDOH Barriers, disease management and Care Coordination Needs in patient with SBO, HTN   Start Date: 02/08/2021  This Visit's Progress: On track  Recent Progress: On track  Priority: High  Note:   Current Barriers:  Knowledge Deficits related to plan of care for management of HTN and SBO  Care Coordination needs related to Limited education about HTN, SBO* 02/15/21 Patient reports improvements gradually in nutrition as he continues to feel full when taking in 4 small meals and ensure during the day but denies nausea and vomiting episodes 02/23/21 unsuccessful outreach Message left for patient 02/25/21 patient with worsening GI symptoms, weakness and sleep disturbances  RN CM Clinical Goal(s):  Patient will demonstrate Improved adherence to prescribed treatment plan for HTN and SBO as evidenced by prevention of re admission  through collaboration with RN Care manager, provider, and care team.   Interventions: Further follow up outreaches for assessment of worsening symptoms, care coordination and disease management needs Inter-disciplinary care team collaboration (see longitudinal plan of care) Evaluation of current treatment plan related to  self management and patient's adherence to plan as established by provider   Hypertension Interventions:  (Status:  Condition stable.  Not addressed this visit.) Long Term Goal Last practice recorded BP readings:  BP Readings from Last 3 Encounters:  02/03/21 122/88  01/18/21 (!) 140/96  12/21/20 138/88  Most recent eGFR/CrCl:  Lab Results  Component Value Date   EGFR >60 12/27/2016    No components found for: CRCL  Assessed social determinant of health barriers Reviewed [facility] resources SW, RN, NP, transitions of care services to include care coordination and disease management Encouragement provided   Surgery (SBO-small bowel resection):  (Status: Goal on track:  Yes.) Short Term  Goal Evaluation of current treatment plan related to  small bowel resection surgery reviewed scheduled provider appointments with patient- surgeon, GI, pcp confirmed availability of transportation to all appointments -surgeon, GI, pcp Completed nutrition assessment  Patient Goals/Self-Care Activities: Take all medications as prescribed Attend all scheduled provider appointments Perform all self care activities independently  Call provider office for new concerns or questions   Follow Up Plan:  The patient has been provided with contact information for the care management team and has been advised to call with any health related questions or concerns.  The care management team will reach out to the patient again over the next 7-10 business  days.       Plan: The patient has been provided with contact information for the care management team and has been advised to call with any health related questions or concerns.  The care management team will reach out to the patient again over the next 7-14 business days.  Cleon Thoma L. Lavina Hamman, RN, BSN, [place] Coordinator Office number [phone] Main [facility] number [phone] Fax number [phone]

## 2021-03-02 ENCOUNTER — Other Ambulatory Visit: Payer: Self-pay | Admitting: *Deleted

## 2021-03-02 NOTE — Patient Outreach (Addendum)
[facility]) Care Management  03/02/2021  Juan David 09-02-1955 [id number]   [facility] transition of care (week 4) Unsuccessful outreach  Juan David [facility]) was referred to [facility] for post surgical transition of care follow up on 02/03/21    Outreach attempt to the listed at the preferred outreach number in [place] [phone]  No answer. [facility] RN CM left HIPAA [facility] Portability and Accountability Act) compliant voicemail message along with CMs contact info.   Plan: [facility] RN CM scheduled this patient for another call attempt within 4-7 business days Unsuccessful outreach on 03/02/21   Freja Faro L. Lavina Hamman, RN, BSN, [place] Coordinator Office number [phone] Mobile number [phone]  Main [facility] number [phone] Fax number [phone]

## 2021-03-04 ENCOUNTER — Encounter (HOSPITAL_COMMUNITY): Payer: Self-pay | Admitting: Internal Medicine

## 2021-03-04 ENCOUNTER — Other Ambulatory Visit (HOSPITAL_COMMUNITY): Payer: Self-pay

## 2021-03-04 NOTE — Progress Notes (Signed)
Attempted to obtain medical history via telephone, unable to reach at this time. I left a voicemail to return pre surgical testing department's phone call.  

## 2021-03-08 ENCOUNTER — Other Ambulatory Visit (HOSPITAL_COMMUNITY): Payer: Self-pay

## 2021-03-15 NOTE — Anesthesia Preprocedure Evaluation (Addendum)
Anesthesia Evaluation  Patient identified by MRN, date of birth, ID band Patient awake    Reviewed: Allergy & Precautions, NPO status , Patient's Chart, lab work & pertinent test results  History of Anesthesia Complications (+) history of anesthetic complications (per pt and wife, had issues waking up after long exlap surgery 11/2020- no issues were documented, pt and wife said they never spoke to anyone other than PACU nurse. no specifics)  Airway Mallampati: I  TM Distance: >3 FB Neck ROM: Full    Dental no notable dental hx. (+) Dental Advisory Given   Pulmonary Current Smoker and Patient abstained from smoking.,  1 cigar/d   Pulmonary exam normal breath sounds clear to auscultation       Cardiovascular hypertension, Pt. on medications + DVT (provoked (postop))  Normal cardiovascular exam Rhythm:Regular Rate:Normal  Echo 2011: - Left ventricle: Systolic function was normal. The estimated   ejection fraction was in the range of 55% to 65%.  - Left atrium: No evidence of thrombus in the atrial cavity or   appendage.  - Atrial septum: PFO present by color doppler. With injection of   agitated saline there were asignificant number of bubbles that   appeared in LA after abdomenal pressure.     Neuro/Psych negative neurological ROS  negative psych ROS   GI/Hepatic GERD  Medicated and Controlled,(+)       alcohol use, Hx rectal adenoca  2018 now w/ weight loss, abdominal pain   Endo/Other  a1c 5.9  Renal/GU negative Renal ROS  negative genitourinary   Musculoskeletal  (+) Arthritis , Osteoarthritis,    Abdominal   Peds  Hematology negative hematology ROS (+)   Anesthesia Other Findings Blind L eye s/p injury 1998  Very frail appearing, cachectic. Barrel chested   Reproductive/Obstetrics negative OB ROS                          Anesthesia Physical Anesthesia Plan  ASA:  3  Anesthesia Plan: MAC   Post-op Pain Management:    Induction:   PONV Risk Score and Plan: 2 and Propofol infusion and TIVA  Airway Management Planned: Natural Airway and Simple Face Mask  Additional Equipment: None  Intra-op Plan:   Post-operative Plan:   Informed Consent: I have reviewed the patients History and Physical, chart, labs and discussed the procedure including the risks, benefits and alternatives for the proposed anesthesia with the patient or authorized representative who has indicated his/her understanding and acceptance.     Dental advisory given and Consent reviewed with POA  Plan Discussed with: CRNA  Anesthesia Plan Comments: (Wife at bedside  exlap 12/04/20 airway required glidescope: 3 (DL x1 with Miller 2- unable to pick up epiglottis. No attempt made to place ETT.While waiting for Glidesope, DL x 1 with Sabra Heck 3- no attempt to place ETT. DL x 1 with Glidescope - grade 1 view and easy placemnt of ETT))       Anesthesia Quick Evaluation

## 2021-03-16 ENCOUNTER — Ambulatory Visit (HOSPITAL_COMMUNITY): Payer: 59 | Admitting: Anesthesiology

## 2021-03-16 ENCOUNTER — Encounter (HOSPITAL_COMMUNITY): Payer: Self-pay | Admitting: Internal Medicine

## 2021-03-16 ENCOUNTER — Other Ambulatory Visit: Payer: Self-pay

## 2021-03-16 ENCOUNTER — Encounter (HOSPITAL_COMMUNITY)
Admission: RE | Disposition: A | Discharge status: Home / Self Care | Payer: Self-pay | Source: Home / Self Care | Attending: Internal Medicine

## 2021-03-16 ENCOUNTER — Ambulatory Visit (HOSPITAL_COMMUNITY)
Admission: RE | Admit: 2021-03-16 | Discharge: 2021-03-16 | Disposition: A | Discharge status: Home / Self Care | Payer: 59 | Attending: Internal Medicine | Admitting: Internal Medicine

## 2021-03-16 ENCOUNTER — Other Ambulatory Visit: Payer: Self-pay | Admitting: *Deleted

## 2021-03-16 DIAGNOSIS — I10 Essential (primary) hypertension: Secondary | ICD-10-CM | POA: Insufficient documentation

## 2021-03-16 DIAGNOSIS — R634 Abnormal weight loss: Secondary | ICD-10-CM | POA: Diagnosis not present

## 2021-03-16 DIAGNOSIS — Z923 Personal history of irradiation: Secondary | ICD-10-CM | POA: Insufficient documentation

## 2021-03-16 DIAGNOSIS — K219 Gastro-esophageal reflux disease without esophagitis: Secondary | ICD-10-CM | POA: Insufficient documentation

## 2021-03-16 DIAGNOSIS — F17290 Nicotine dependence, other tobacco product, uncomplicated: Secondary | ICD-10-CM | POA: Insufficient documentation

## 2021-03-16 DIAGNOSIS — C20 Malignant neoplasm of rectum: Secondary | ICD-10-CM

## 2021-03-16 DIAGNOSIS — Z9221 Personal history of antineoplastic chemotherapy: Secondary | ICD-10-CM | POA: Diagnosis not present

## 2021-03-16 DIAGNOSIS — Q2112 Patent foramen ovale: Secondary | ICD-10-CM | POA: Insufficient documentation

## 2021-03-16 DIAGNOSIS — H5462 Unqualified visual loss, left eye, normal vision right eye: Secondary | ICD-10-CM | POA: Diagnosis not present

## 2021-03-16 DIAGNOSIS — K319 Disease of stomach and duodenum, unspecified: Secondary | ICD-10-CM | POA: Diagnosis not present

## 2021-03-16 DIAGNOSIS — R1013 Epigastric pain: Secondary | ICD-10-CM | POA: Diagnosis not present

## 2021-03-16 DIAGNOSIS — K2990 Gastroduodenitis, unspecified, without bleeding: Secondary | ICD-10-CM

## 2021-03-16 DIAGNOSIS — R194 Change in bowel habit: Secondary | ICD-10-CM

## 2021-03-16 DIAGNOSIS — R54 Age-related physical debility: Secondary | ICD-10-CM | POA: Diagnosis not present

## 2021-03-16 DIAGNOSIS — K3189 Other diseases of stomach and duodenum: Secondary | ICD-10-CM | POA: Insufficient documentation

## 2021-03-16 DIAGNOSIS — M1990 Unspecified osteoarthritis, unspecified site: Secondary | ICD-10-CM | POA: Diagnosis not present

## 2021-03-16 DIAGNOSIS — Z85048 Personal history of other malignant neoplasm of rectum, rectosigmoid junction, and anus: Secondary | ICD-10-CM | POA: Diagnosis not present

## 2021-03-16 DIAGNOSIS — K5730 Diverticulosis of large intestine without perforation or abscess without bleeding: Secondary | ICD-10-CM | POA: Diagnosis not present

## 2021-03-16 DIAGNOSIS — K259 Gastric ulcer, unspecified as acute or chronic, without hemorrhage or perforation: Secondary | ICD-10-CM | POA: Insufficient documentation

## 2021-03-16 DIAGNOSIS — K56699 Other intestinal obstruction unspecified as to partial versus complete obstruction: Secondary | ICD-10-CM | POA: Diagnosis not present

## 2021-03-16 DIAGNOSIS — K2980 Duodenitis without bleeding: Secondary | ICD-10-CM | POA: Insufficient documentation

## 2021-03-16 DIAGNOSIS — K2970 Gastritis, unspecified, without bleeding: Secondary | ICD-10-CM

## 2021-03-16 DIAGNOSIS — Z681 Body mass index (BMI) 19 or less, adult: Secondary | ICD-10-CM | POA: Insufficient documentation

## 2021-03-16 DIAGNOSIS — K921 Melena: Secondary | ICD-10-CM | POA: Insufficient documentation

## 2021-03-16 DIAGNOSIS — K31A19 Gastric intestinal metaplasia without dysplasia, unspecified site: Secondary | ICD-10-CM | POA: Diagnosis not present

## 2021-03-16 DIAGNOSIS — K5720 Diverticulitis of large intestine with perforation and abscess without bleeding: Secondary | ICD-10-CM | POA: Insufficient documentation

## 2021-03-16 DIAGNOSIS — Z86718 Personal history of other venous thrombosis and embolism: Secondary | ICD-10-CM | POA: Insufficient documentation

## 2021-03-16 HISTORY — PX: ESOPHAGOGASTRODUODENOSCOPY (EGD) WITH PROPOFOL: SHX5813

## 2021-03-16 HISTORY — PX: BIOPSY: SHX5522

## 2021-03-16 LAB — CBC
HCT: 39.3 % (ref 39.0–52.0)
Hemoglobin: 12.6 g/dL — ABNORMAL LOW (ref 13.0–17.0)
MCH: 29.2 pg (ref 26.0–34.0)
MCHC: 32.1 g/dL (ref 30.0–36.0)
MCV: 91.2 fL (ref 80.0–100.0)
Platelets: 258 10*3/uL (ref 150–400)
RBC: 4.31 MIL/uL (ref 4.22–5.81)
RDW: 13.5 % (ref 11.5–15.5)
WBC: 5.7 10*3/uL (ref 4.0–10.5)
nRBC: 0 % (ref 0.0–0.2)

## 2021-03-16 LAB — COMPREHENSIVE METABOLIC PANEL
ALT: 24 U/L (ref 0–44)
AST: 23 U/L (ref 15–41)
Albumin: 4.4 g/dL (ref 3.5–5.0)
Alkaline Phosphatase: 82 U/L (ref 38–126)
Anion gap: 13 (ref 5–15)
BUN: 19 mg/dL (ref 8–23)
CO2: 22 mmol/L (ref 22–32)
Calcium: 9.8 mg/dL (ref 8.9–10.3)
Chloride: 99 mmol/L (ref 98–111)
Creatinine, Ser: 1.01 mg/dL (ref 0.61–1.24)
GFR, Estimated: >60 mL/min (ref >60)
Glucose, Bld: 82 mg/dL (ref 70–99)
Potassium: 4.6 mmol/L (ref 3.5–5.1)
Sodium: 134 mmol/L — ABNORMAL LOW (ref 135–145)
Total Bilirubin: 0.8 mg/dL (ref 0.3–1.2)
Total Protein: 8.1 g/dL (ref 6.5–8.1)

## 2021-03-16 SURGERY — ESOPHAGOGASTRODUODENOSCOPY (EGD) WITH PROPOFOL
Anesthesia: Monitor Anesthesia Care

## 2021-03-16 MED ORDER — PROPOFOL 500 MG/50ML IV EMUL
INTRAVENOUS | Status: DC | PRN
  Administered 2021-03-16: 150 ug/kg/min via INTRAVENOUS

## 2021-03-16 MED ORDER — LACTATED RINGERS IV SOLN: INTRAVENOUS | Status: DC

## 2021-03-16 MED ORDER — PHENYLEPHRINE 40 MCG/ML (10ML) SYRINGE FOR IV PUSH (FOR BLOOD PRESSURE SUPPORT)
PREFILLED_SYRINGE | INTRAVENOUS | Status: DC | PRN
  Administered 2021-03-16: 40 ug via INTRAVENOUS

## 2021-03-16 MED ORDER — PROPOFOL 10 MG/ML IV BOLUS
INTRAVENOUS | Status: DC | PRN
Start: 2021-03-16 — End: 2021-03-16
  Administered 2021-03-16: 30 mg via INTRAVENOUS
  Administered 2021-03-16 (×2): 20 mg via INTRAVENOUS
  Administered 2021-03-16: 50 mg via INTRAVENOUS
  Administered 2021-03-16: 30 mg via INTRAVENOUS

## 2021-03-16 MED ORDER — LIDOCAINE 2% (20 MG/ML) 5 ML SYRINGE
INTRAMUSCULAR | Status: DC | PRN
  Administered 2021-03-16: 100 mg via INTRAVENOUS

## 2021-03-16 MED ORDER — SODIUM CHLORIDE 0.9 % IV SOLN: INTRAVENOUS | Status: DC

## 2021-03-16 SURGICAL SUPPLY — 25 items

## 2021-03-16 NOTE — Anesthesia Procedure Notes (Signed)
Procedure Name: MAC Date/Time: 03/16/2021 10:49 AM Performed by: Deliah Boston, CRNA Pre-anesthesia Checklist: Patient identified, Emergency Drugs available, Suction available and Patient being monitored Patient Re-evaluated:Patient Re-evaluated prior to induction Oxygen Delivery Method: Simple face mask Preoxygenation: Pre-oxygenation with 100% oxygen Induction Type: IV induction Placement Confirmation: positive ETCO2 and breath sounds checked- equal and bilateral

## 2021-03-16 NOTE — Discharge Instructions (Signed)
YOU HAD AN ENDOSCOPIC PROCEDURE TODAY: Refer to the procedure report and other information in the discharge instructions given to you for any specific questions about what was found during the examination. If this information does not answer your questions, please call [place] office at [phone] to clarify.  ° °YOU SHOULD EXPECT: Some feelings of bloating in the abdomen. Passage of more gas than usual. Walking can help get rid of the air that was put into your GI tract during the procedure and reduce the bloating. If you had a lower endoscopy (such as a colonoscopy or flexible sigmoidoscopy) you may notice spotting of blood in your stool or on the toilet paper. Some abdominal soreness may be present for a day or two, also. ° °DIET: Your first meal following the procedure should be a light meal and then it is ok to progress to your normal diet. A half-sandwich or bowl of soup is an example of a good first meal. Heavy or fried foods are harder to digest and may make you feel nauseous or bloated. Drink plenty of fluids but you should avoid alcoholic beverages for 24 hours. If you had a esophageal dilation, please see attached instructions for diet.   ° °ACTIVITY: Your care partner should take you home directly after the procedure. You should plan to take it easy, moving slowly for the rest of the day. You can resume normal activity the day after the procedure however YOU SHOULD NOT DRIVE, use power tools, machinery or perform tasks that involve climbing or major physical exertion for 24 hours (because of the sedation medicines used during the test).  ° °SYMPTOMS TO REPORT IMMEDIATELY: °A gastroenterologist can be reached at any hour. Please call [phone]  for any of the following symptoms:  °Following lower endoscopy (colonoscopy, flexible sigmoidoscopy) °Excessive amounts of blood in the stool  °Significant tenderness, worsening of abdominal pains  °Swelling of the abdomen that is new, acute  °Fever of 100° or  higher  °Following upper endoscopy (EGD, EUS, ERCP, esophageal dilation) °Vomiting of blood or coffee ground material  °New, significant abdominal pain  °New, significant chest pain or pain under the shoulder blades  °Painful or persistently difficult swallowing  °New shortness of breath  °Black, tarry-looking or red, bloody stools ° °FOLLOW UP:  °If any biopsies were taken you will be contacted by phone or by letter within the next 1-3 weeks. Call [phone]  if you have not heard about the biopsies in 3 weeks.  °Please also call with any specific questions about appointments or follow up tests. ° °

## 2021-03-16 NOTE — Op Note (Addendum)
[facility] Patient Name: Juan David Procedure Date: 03/16/2021 MRN: [id number] Attending MD: Gatha Mayer , MD Date of Birth: 09-19-1955 CSN: [id number] Age: 66 Admit Type: Outpatient Procedure:                Colonoscopy Indications:              Change in bowel habits, hx rectal cancer Providers:                Gatha Mayer, MD, Dulcy Fanny, Benetta Spar, Technician Referring MD:              Medicines:                Propofol per Anesthesia, Monitored Anesthesia Care Complications:            No immediate complications. Estimated Blood Loss:     Estimated blood loss: none. Procedure:                Pre-Anesthesia Assessment:                           - Prior to the procedure, a History and Physical                            was performed, and patient medications and                            allergies were reviewed. The patient's tolerance of                            previous anesthesia was also reviewed. The risks                            and benefits of the procedure and the sedation                            options and risks were discussed with the patient.                            All questions were answered, and informed consent                            was obtained. Prior Anticoagulants: The patient has                            taken no previous anticoagulant or antiplatelet                            agents. ASA Grade Assessment: III - A patient with                            severe systemic disease. After reviewing the risks  and benefits, the patient was deemed in                            satisfactory condition to undergo the procedure.                           After obtaining informed consent, the colonoscope                            was passed under direct vision. Throughout the                            procedure, the patient's blood pressure, pulse, and                             oxygen saturations were monitored continuously. The                            CF-[id number] ([id number]) Olympus colonoscope was                            introduced through the anus with the intention of                            advancing to the cecum. The scope was advanced to                            the sigmoid colon before the procedure was aborted.                            Medications were given. The colonoscopy was                            performed with difficulty due to bowel stenosis.                            The patient tolerated the procedure well. The                            quality of the bowel preparation was good. The                            rectum was photographed. Scope In: 11:10:36 AM Scope Out: 11:26:19 AM Total Procedure Duration: 0 hours 15 minutes 43 seconds  Findings:      The perianal and digital rectal examinations were normal.      A severe stenosis was found in the distal sigmoid colon and was       non-traversed.      Diverticula were found in the sigmoid colon.      The exam was otherwise without abnormality on direct and retroflexion       views. 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 No  malignant features - ? post XRT, diverticular,                            other. There was thickening in this area on last CT                            when admitted with small bowel perforation. Unable                            to pass despite ultraslinm scope, abdominal                            pressure and position changes                           - Diverticulosis in the sigmoid colon.                           - The examination was otherwise normal on direct                            and retroflexion views.                           - No specimens collected.                           - Personal history of malignant rectal neoplasm.                            resected and Tx chemo  XRT 2018 Moderate Sedation:      Not Applicable - Patient had care per Anesthesia. Recommendation:           - Patient has a contact number available for                            emergencies. The signs and symptoms of potential                            delayed complications were discussed with the                            patient. Return to normal activities tomorrow.                            Written discharge instructions were provided to the                            patient.                           - Resume previous diet.                           - Continue present medications.                           -  See the other procedure note for documentation of                            additional recommendations.                           - Will get CBC and CMET today in anticipation of                            performing Ct abd/pelvis soon. Office to schedule                            CT abd/pelvis with contrast re: distal sigmoid                            colon stricture, history of rectal cancer Procedure Code(s):        --- Professional ---                           [phone], 23, Colonoscopy, flexible; diagnostic,                            including collection of specimen(s) by brushing or                            washing, when performed (separate procedure) Diagnosis Code(s):        --- Professional ---                           Z00.923, Other intestinal obstruction unspecified                            as to partial versus complete obstruction                           R19.4, Change in bowel habit CPT copyright 2019 [employer]. All rights reserved. The codes documented in this report are preliminary and upon coder review may  be revised to meet current compliance requirements. Gatha Mayer, MD 03/16/2021 11:47:28 AM This report has been signed electronically. Number of Addenda: 0

## 2021-03-16 NOTE — H&P (Signed)
[facility] History and Physical   Primary Care Physician:  Leonard Downing, MD   Reason for Procedure:   Prior abdominal pain, melena, weight loss and perforated small intestine (repaired)  Plan:    EGD, colonoscopy     HPI: Juan David is a 66 y.o. male seen in November for altered bowel habits, abdominal pain. Melena and weight loss. Shortly after he had a perforated small bowel and surgery. He is here for follow-up evaluation.   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   Traumatic glaucoma, 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 at [facility] in [place]   08-18-2001   Wears glasses     Past Surgical History:  Procedure Laterality Date   ARTHROSCOPIC REPAIR ACL Right [place] LEFT EYE INJURY  08-18-2001   [place]   ruptured globe- repair corneal laceration, reposition of prolapsed uveal tissue   HEMORRHOID SURGERY N/A 09/01/2016   Procedure: HEMORRHOIDECTOMY;  Surgeon: Leighton Ruff, MD;  Location: [facility];  Service: General;  Laterality: N/A;   LAPAROTOMY N/A 12/04/2020   Procedure: EXPLORATORY LAPAROTOMY small bowel resection;  Surgeon: Dwan Bolt, MD;  Location: [facility];  Service: General;  Laterality: N/A;   SUPERFICIAL KERATECTOMY Left 06-29-2011    [facility]    with EDTA scrub of left eye   TEE WITHOUT CARDIOVERSION  05-11-2009  dr Dorris Carnes    LVSEF 55-65%/  mild thickened AV without AI/  trace MR/ mixed fixed artherosclerosis plaqueing thoracic aorta/  no evidence thrombus/  by agitated saling and color doppler there was a PFO    Prior to Admission medications   Medication Sig Start Date End Date Taking? Authorizing Provider  dicyclomine (BENTYL) 20 MG tablet Take 1 tablet (20 mg total) by mouth 3 (three) times daily as needed for spasms (before meals). 12/01/20  Yes Gatha Mayer, MD  hydrochlorothiazide (HYDRODIURIL) 50 MG tablet Take 1/2 of a tablet by mouth every 24 hours for hypertension. 11/04/20  Yes   Multiple Vitamin (MULTIVITAMIN WITH MINERALS) TABS tablet Take 1 tablet by mouth in the morning.   Yes [provider]  omeprazole (PRILOSEC) 20 MG capsule Take 20 mg by mouth in the morning.   Yes [provider]  polyethylene glycol (MIRALAX / GLYCOLAX) 17 g packet Take 17 g by mouth in the morning.   Yes [provider]  simethicone (MYLICON) 80 MG chewable tablet Chew 1 tablet (80 mg total) by mouth 4 (four) times daily as needed for flatulence. 12/21/20  Yes Winferd Humphrey, PA-C  timolol (TIMOPTIC) 0.5 % ophthalmic solution Place 1 drop into the left eye every morning.  11/19/14  Yes [provider]  acetaminophen (TYLENOL) 500 MG  tablet Take 2 tablets (1,000 mg total) by mouth every 6 (six) hours as needed. 02/03/21   Saverio Danker, PA-C  traMADol (ULTRAM) 50 MG tablet Take 1 tablet (50 mg total) by mouth every 6 (six) hours as needed for moderate pain. Patient not taking: Reported on 03/09/2021 02/03/21   Saverio Danker, PA-C    Current Facility-Administered Medications  Medication Dose Route Frequency Provider Last Rate Last Admin   0.9 %  sodium chloride infusion   Intravenous Continuous Gatha Mayer, MD       lactated ringers infusion   Intravenous Continuous Gatha Mayer, MD 10 mL/hr at 03/16/21 1035 Continued from Pre-op at 03/16/21 1035    Allergies as of 01/19/2021    (No Known Allergies)    Family History  Problem Relation Age of Onset   Colon cancer Mother     Social History   Socioeconomic History   Marital status: Married    Spouse name: susan True   Number of children: Not on file   Years of education: Not on file   Highest education level: Not on file  Occupational History   Not on file  Tobacco Use   Smoking status: Every Day    Types: Cigars   Smokeless tobacco: Former    Quit date: 08/25/2008   Tobacco comments:    per pt currently smoke small cigar x2 daily (average) previous smoked cigeratte until 2010  Vaping Use   Vaping Use: Never used  Substance and Sexual Activity   Alcohol use: Yes    Comment: occasionally   Drug use: Not Currently    Types: Marijuana   Sexual activity: Not on file  Other Topics Concern   Not on file  Social History Narrative   The patient is married no children   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   He smokes cigars 2 caffeinated beverages daily no drug use no other tobacco some alcohol use   Social Determinants of Radio broadcast assistant Strain: Low Risk    Difficulty of Paying Living Expenses: Not hard at all  Food Insecurity: No Food Insecurity   Worried About Charity fundraiser in the Last Year: Never true   Arboriculturist in the Last Year: Never true  Transportation Needs: No Transportation Needs   Lack of Transportation (Medical): No   Lack of Transportation (Non-Medical): No  Physical Activity: Not on file  Stress: No Stress Concern Present   Feeling of Stress : Not at all  Social Connections: Socially Integrated   Frequency of Communication with Friends and Family: More than three times a week   Frequency of Social Gatherings with Friends and Family: More than three times a week   Attends Religious Services: 1 to 4 times per year   Active Member of [employer] or Organizations: Yes   Attends Archivist Meetings: 1 to 4  times per year   Marital Status: Married  Human resources officer Violence: Not At Risk   Fear of Current or Ex-Partner: No   Emotionally Abused: No   Physically Abused: No   Sexually Abused: No    Review of Systems:  All other review of systems negative except as mentioned in the HPI.  Physical Exam: Vital signs BP (!) 150/84    Pulse 71    Temp 97.8 F (36.6 C) (Temporal)    Resp 16    Ht 5\' 11"  (1.803 m)  Wt 52.6 kg    SpO2 100%    BMI 16.18 kg/m   General:   Alert,  Well-developed, well-nourished, pleasant and cooperative in NAD Lungs:  Clear throughout to auscultation.   Heart:  Regular rate and rhythm; no murmurs, clicks, rubs,  or gallops. Abdomen:  Soft, nontender and nondistended. Normal bowel sounds.   Neuro/Psych:  Alert and cooperative. Normal mood and affect. A and O x 3   @Danea Manter  Simonne Maffucci, MD, [facility] [phone] (pager) 03/16/2021 10:44 AM@

## 2021-03-16 NOTE — Transfer of Care (Signed)
Immediate Anesthesia Transfer of Care Note  Patient: Juan David  Procedure(s) Performed: Procedure(s): ESOPHAGOGASTRODUODENOSCOPY (EGD) WITH PROPOFOL (N/A) BIOPSY ABORTED COLONOSCOPY  Patient Location: PACU  Anesthesia Type:MAC  Level of Consciousness: Patient easily awoken, sedated, comfortable, cooperative, following commands, responds to stimulation.   Airway & Oxygen Therapy: Patient spontaneously breathing, ventilating well, oxygen via simple oxygen mask.  Post-op Assessment: Report given to PACU RN, vital signs reviewed and stable, moving all extremities.   Post vital signs: Reviewed and stable.  Complications: No apparent anesthesia complications Last Vitals:  Vitals Value Taken Time  BP 98/72 03/16/21 1133  Temp    Pulse 66 03/16/21 1137  Resp 19 03/16/21 1137  SpO2 100 % 03/16/21 1137  Vitals shown include unvalidated device data.  Last Pain:  Vitals:   03/16/21 1133  TempSrc:   PainSc: Asleep         Complications: No notable events documented.

## 2021-03-16 NOTE — Op Note (Signed)
[facility] Patient Name: Juan David Procedure Date: 03/16/2021 MRN: [id number] Attending MD: Gatha Mayer , MD Date of Birth: October 28, 1955 CSN: [id number] Age: 66 Admit Type: Outpatient Procedure:                Upper GI endoscopy Indications:              Epigastric abdominal pain Providers:                Gatha Mayer, MD, Dulcy Fanny, Benetta Spar, Technician Referring MD:              Medicines:                Propofol per Anesthesia, Monitored Anesthesia Care Complications:            No immediate complications. Estimated Blood Loss:     Estimated blood loss was minimal. Procedure:                Pre-Anesthesia Assessment:                           - Prior to the procedure, a History and Physical                            was performed, and patient medications and                            allergies were reviewed. The patient's tolerance of                            previous anesthesia was also reviewed. The risks                            and benefits of the procedure and the sedation                            options and risks were discussed with the patient.                            All questions were answered, and informed consent                            was obtained. Prior Anticoagulants: The patient has                            taken no previous anticoagulant or antiplatelet                            agents. ASA Grade Assessment: III - A patient with                            severe systemic disease. After reviewing the risks  and benefits, the patient was deemed in                            satisfactory condition to undergo the procedure.                           After obtaining informed consent, the endoscope was                            passed under direct vision. Throughout the                            procedure, the patient's blood pressure, pulse, and                             oxygen saturations were monitored continuously. The                            [id number]-[id number] ([id number]) Olympus endoscope was introduced                            through the mouth, and advanced to the second part                            of duodenum. The upper GI endoscopy was                            accomplished without difficulty. The patient                            tolerated the procedure well. Scope In: Scope Out: Findings:      Localized inflammation characterized by congestion (edema), erosions and       granularity was found in the prepyloric region of the stomach. Biopsies       were taken with a cold forceps for histology. Verification of patient       identification for the specimen was done. Estimated blood loss was       minimal.      Diffuse nodular mucosa was found in the duodenal bulb. Biopsies were       taken with a cold forceps for histology. Verification of patient       identification for the specimen was done. Estimated blood loss was       minimal.      The exam was otherwise without abnormality.      The cardia and gastric fundus were normal on retroflexion. Impression:               - Gastritis. Biopsied.                           - Nodular mucosa in the duodenal bulb. Biopsied.                           - The examination was otherwise normal. Moderate Sedation:      Not Applicable - Patient had care per Anesthesia. Recommendation:           -  Patient has a contact number available for                            emergencies. The signs and symptoms of potential                            delayed complications were discussed with the                            patient. Return to normal activities tomorrow.                            Written discharge instructions were provided to the                            patient.                           - See the other procedure note for documentation of                            additional  recommendations.                           - Await pathology results. Procedure Code(s):        --- Professional ---                           [phone], Esophagogastroduodenoscopy, flexible,                            transoral; with biopsy, single or multiple Diagnosis Code(s):        --- Professional ---                           K29.70, Gastritis, unspecified, without bleeding                           K31.89, Other diseases of stomach and duodenum                           R10.13, Epigastric pain CPT copyright 2019 American Medical Association. All rights reserved. The codes documented in this report are preliminary and upon coder review may  be revised to meet current compliance requirements. Gatha Mayer, MD 03/16/2021 11:41:50 AM This report has been signed electronically. Number of Addenda: 0

## 2021-03-16 NOTE — Patient Outreach (Signed)
[facility]) Care Management  03/16/2021  COBI DELPH 1955/06/29 [id number]   [facility] Unsuccessful outreach   Outreach attempt to the listed at the preferred outreach number in EPIC  No answer. [facility] RN CM left HIPAA [facility] Portability and Accountability Act) compliant voicemail message along with CMs contact info.   During the process of note entry noted patient at [facility] for follow up evaluation EGD, colonoscopy for prior abdominal pain, melena, weight loss and perforated small intestine (repaired)  Plan: [facility] RN CM scheduled this patient for another call attempt within 4-7 business days Unsuccessful outreach on 03/02/21, 03/16/21   Stryker Veasey L. Lavina Hamman, RN, BSN, [place] Coordinator Office number [phone] Mobile number [phone]  Main [facility] number [phone] Fax number [phone]

## 2021-03-16 NOTE — Anesthesia Postprocedure Evaluation (Signed)
Anesthesia Post Note  Patient: Juan David  Procedure(s) Performed: ESOPHAGOGASTRODUODENOSCOPY (EGD) WITH PROPOFOL BIOPSY ABORTED COLONOSCOPY     Patient location during evaluation: PACU Anesthesia Type: MAC Level of consciousness: awake and alert Pain management: pain level controlled Vital Signs Assessment: post-procedure vital signs reviewed and stable Respiratory status: spontaneous breathing, nonlabored ventilation and respiratory function stable Cardiovascular status: blood pressure returned to baseline and stable Postop Assessment: no apparent nausea or vomiting Anesthetic complications: no   No notable events documented.  Last Vitals:  Vitals:   03/16/21 1135 03/16/21 1140  BP:  122/81  Pulse:  64  Resp:  18  Temp:  36.5 C  SpO2: 100% 100%    Last Pain:  Vitals:   03/16/21 1140  TempSrc: Temporal  PainSc: 0-No pain                 Pervis Hocking

## 2021-03-17 LAB — SURGICAL PATHOLOGY

## 2021-03-19 ENCOUNTER — Encounter (HOSPITAL_COMMUNITY): Payer: Self-pay | Admitting: Internal Medicine

## 2021-03-23 ENCOUNTER — Ambulatory Visit (HOSPITAL_COMMUNITY)
Admission: RE | Admit: 2021-03-23 | Discharge: 2021-03-23 | Disposition: A | Discharge status: Home / Self Care | Payer: 59 | Source: Ambulatory Visit | Attending: Internal Medicine | Admitting: Internal Medicine

## 2021-03-23 ENCOUNTER — Other Ambulatory Visit: Payer: Self-pay

## 2021-03-23 ENCOUNTER — Other Ambulatory Visit: Payer: Self-pay | Admitting: *Deleted

## 2021-03-23 DIAGNOSIS — I700 Atherosclerosis of aorta: Secondary | ICD-10-CM | POA: Diagnosis not present

## 2021-03-23 DIAGNOSIS — K56699 Other intestinal obstruction unspecified as to partial versus complete obstruction: Secondary | ICD-10-CM | POA: Diagnosis not present

## 2021-03-23 DIAGNOSIS — C20 Malignant neoplasm of rectum: Secondary | ICD-10-CM | POA: Diagnosis not present

## 2021-03-23 MED ORDER — IOHEXOL 300 MG/ML  SOLN
100.0000 mL | Freq: Once | INTRAMUSCULAR | Status: AC | PRN
  Administered 2021-03-23: 100 mL via INTRAVENOUS

## 2021-03-23 NOTE — Patient Outreach (Signed)
[facility]) Care Management Telephonic RN Care Manager Note   03/23/2021 Name:  Juan David MRN:  [id number] DOB:  June 05, 1955  Summary: Follow up outreach to Juan David & Juan David Juan David is doing very well  He returned to work on 03/22/21 for 4 hours and tolerated it well He will continue to do 4 hours as tolerated He will have a CT completed today per MD order  He denies any medical worsening concerns at this time Juan David is agreeable to quarterly [facility] outreaches and access prn  Recommendations/Changes made from today's visit: RN CM commended and provided encouragement for return to work and to monitor for any worsening symptoms [facility] progression discussed   Subjective: Juan David is an 66 y.o. year old male who is a primary patient of Leonard Downing, MD. The care management team was consulted for assistance with care management and/or care coordination needs.    Telephonic RN Care Manager completed Telephone Visit today.   Objective:  Medications Reviewed Today     Reviewed by Gatha Mayer, MD (Physician) on 03/16/21 at 1038  Med List Status: Complete   Medication Order Taking? Sig Documenting Provider Last Dose Status Informant  acetaminophen (TYLENOL) 500 MG tablet [id number] No Take 2 tablets (1,000 mg total) by mouth every 6 (six) hours as needed. Saverio Danker, PA-C More than a month Active Spouse/Significant Other  dicyclomine (BENTYL) 20 MG tablet [id number] Yes Take 1 tablet (20 mg total) by mouth 3 (three) times daily as needed for spasms (before meals). Gatha Mayer, MD Past Month Active Spouse/Significant Other  hydrochlorothiazide (HYDRODIURIL) 50 MG tablet [id number] Yes Take 1/2 of a tablet by mouth every 24 hours for hypertension.  03/15/2021 Active Spouse/Significant Other  Multiple Vitamin (MULTIVITAMIN WITH MINERALS) TABS tablet [id number] Yes Take 1 tablet by mouth in the morning. [provider] Past Week Active  Spouse/Significant Other  omeprazole (PRILOSEC) 20 MG capsule [id number] Yes Take 20 mg by mouth in the morning. [provider] 03/15/2021 Active Spouse/Significant Other  polyethylene glycol (MIRALAX / GLYCOLAX) 17 g packet [id number] Yes Take 17 g by mouth in the morning. [provider] 03/16/2021 Active Spouse/Significant Other  simethicone (MYLICON) 80 MG chewable tablet [id number] Yes Chew 1 tablet (80 mg total) by mouth 4 (four) times daily as needed for flatulence. Winferd Humphrey, PA-C Past Month Active Spouse/Significant Other           Med Note Kenton Kingfisher, Kathrine Haddock Mar 09, 2021  9:59 AM) On hand  timolol (TIMOPTIC) 0.5 % ophthalmic solution [id number] Yes Place 1 drop into the left eye every morning.  [provider] 03/16/2021 Active Spouse/Significant Other           Med Note Buford Dresser, XYRISH   Fri Dec 04, 2020 10:55 AM)    traMADol (ULTRAM) 50 MG tablet [id number] No Take 1 tablet (50 mg total) by mouth every 6 (six) hours as needed for moderate pain.  Patient not taking: Reported on 03/09/2021   Saverio Danker, PA-C Not Taking Consider Medication Status and Discontinue (No longer needed (for PRN medications)) Spouse/Significant Other           Patient Active Problem List   Diagnosis Date Noted   Abdominal pain, epigastric    Gastritis and gastroduodenitis    Change in bowel habit    History of malignant neoplasm of colon 02/25/2021   S/P small bowel resection 02/23/2021   Small bowel obstruction due  to postoperative adhesions 01/20/2021   History of exploratory laparotomy 12/21/2020   Moderate protein-calorie malnutrition ([place]) 12/21/2020   Small bowel perforation ([place]) 12/04/2020   Adenocarcinoma of rectum ([facility]) 09/28/2016   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   Ruptured globe of left eye 01/31/2011     SDOH:  (Social Determinants of Health) assessments and interventions performed:  SDOH Interventions    Flowsheet Row Most Recent Value   SDOH Interventions   Food Insecurity Interventions Intervention Not Indicated  Financial Strain Interventions Intervention Not Indicated  Stress Interventions Intervention Not Indicated  Transportation Interventions Intervention Not Indicated       Care Plan  Review of patient past medical history, allergies, medications, health status, including review of consultants reports, laboratory and other test data, was performed as part of comprehensive evaluation for care management services.   Care Plan : RN Care Manager Plan of Care  Updates made by Barbaraann Faster, RN since 03/23/2021 12:00 AM     Problem: CHL AMB "PATIENT-SPECIFIC PROBLEM"Complex Care Coordination Needs and disease management in patient with SBO, HTN   Priority: High     Long-Range Goal: Establish Plan of Care for Management Complex SDOH Barriers, disease management and Care Coordination Needs in patient with SBO, HTN   Start Date: 02/08/2021  This Visit's Progress: On track  Recent Progress: On track  Priority: High  Note:   Current Barriers:  Knowledge Deficits related to plan of care for management of HTN and SBO  Care Coordination needs related to Limited education about HTN, SBO* 02/15/21 Patient reports improvements gradually in nutrition as he continues to feel full when taking in 4 small meals and ensure during the day but denies nausea and vomiting episodes 02/23/21 unsuccessful outreach Message left for patient 02/25/21 patient with worsening GI symptoms, weakness and sleep disturbances 03/23/21 doing better Returned to work 03/22/21 4 hour days (tolerable) Agrees quarterly outreaches  RN CM Clinical Goal(s):  Patient will demonstrate Improved adherence to prescribed treatment plan for HTN and SBO as evidenced by prevention of re admission  through collaboration with RN Care manager, provider, and care team.  03/23/21 goal met re admission prevention  Interventions: Further follow up outreaches for  assessment of worsening symptoms, care coordination and disease management needs Inter-disciplinary care team collaboration (see longitudinal plan of care) Evaluation of current treatment plan related to  self management and patient's adherence to plan as established by provider   Hypertension Interventions:  (Status:  Goal on track:  NO.) Long Term Goal-continues with BP elevations 03/23/21 scheduled for CT at 1700 Continue home HTN management Last practice recorded BP readings:  BP Readings from Last 3 Encounters:  03/16/21 (!) 160/95  02/03/21 122/88  01/18/21 (!) 140/96  Most recent eGFR/CrCl:  Lab Results  Component Value Date   EGFR >60 12/27/2016    No components found for: CRCL  Evaluation of current treatment plan related to hypertension self management and patient's adherence to plan as established by provider Reviewed medications with patient and discussed importance of compliance Discussed plans with patient for ongoing care management follow up and provided patient with direct contact information for care management team Assessed social determinant of health barriers Reviewed [facility] resources SW, RN, NP, transitions of care services to include care coordination and disease management Encouragement provided   Surgery (SBO-small bowel resection):  (Status: Goal on track:  Yes.) Short Term Goal Evaluation of current treatment plan related to  small bowel resection surgery reviewed scheduled provider appointments  with patient-  Completed nutrition assessment  Patient Goals/Self-Care Activities: Take all medications as prescribed Attend all scheduled provider appointments Attend church or other social activities Perform all self care activities independently  Perform IADL's (shopping, preparing meals, housekeeping, managing finances) independently Call provider office for new concerns or questions   Follow Up Plan:  The patient has been provided with contact information for  the care management team and has been advised to call with any health related questions or concerns.  The care management team will reach out to the patient again over the next 90+ business  days.       Plan: The patient has been provided with contact information for the care management team and has been advised to call with any health related questions or concerns.  The care management team will reach out to the patient again over the next 90+ business days.  Jaysa Kise L. Lavina Hamman, RN, BSN, [place] Coordinator Office number [phone] Main [facility] number [phone] Fax number [phone]

## 2021-03-30 DIAGNOSIS — Z9049 Acquired absence of other specified parts of digestive tract: Secondary | ICD-10-CM | POA: Diagnosis not present

## 2021-03-30 DIAGNOSIS — T8189XA Other complications of procedures, not elsewhere classified, initial encounter: Secondary | ICD-10-CM | POA: Diagnosis not present

## 2021-03-30 DIAGNOSIS — K56699 Other intestinal obstruction unspecified as to partial versus complete obstruction: Secondary | ICD-10-CM | POA: Diagnosis not present

## 2021-03-30 DIAGNOSIS — Z09 Encounter for follow-up examination after completed treatment for conditions other than malignant neoplasm: Secondary | ICD-10-CM | POA: Diagnosis not present

## 2021-04-08 ENCOUNTER — Ambulatory Visit: Payer: 59 | Admitting: Internal Medicine

## 2021-04-08 ENCOUNTER — Encounter: Payer: Self-pay | Admitting: Internal Medicine

## 2021-04-08 VITALS — BP 110/70 | HR 75 | Ht 71.0 in | Wt 124.5 lb

## 2021-04-08 DIAGNOSIS — K2990 Gastroduodenitis, unspecified, without bleeding: Secondary | ICD-10-CM

## 2021-04-08 DIAGNOSIS — K2970 Gastritis, unspecified, without bleeding: Secondary | ICD-10-CM | POA: Diagnosis not present

## 2021-04-08 DIAGNOSIS — Z85048 Personal history of other malignant neoplasm of rectum, rectosigmoid junction, and anus: Secondary | ICD-10-CM

## 2021-04-08 DIAGNOSIS — K56699 Other intestinal obstruction unspecified as to partial versus complete obstruction: Secondary | ICD-10-CM

## 2021-04-08 NOTE — Patient Instructions (Signed)
Glad things are getting better.  I will communicate with Dr. Zenia Resides and work with her regarding further evaluation of the stricture.  I appreciate the opportunity to care for you. Gatha Mayer, MD, [facility]

## 2021-04-08 NOTE — Progress Notes (Signed)
Juan David 66 y.o. 1955-08-08 [id number]  Assessment & Plan:   Encounter Diagnoses  Name Primary?   Sigmoid stricture (HCC) Yes   Gastritis and gastroduodenitis    Personal history of rectal cancer    I wonder if the stricture is not related to perforation/surgery issues.  He had a colonoscopy that was suspected to be successful in 2021 (surmised through chart review I do not have actual records) and then I could not complete 1 the other month.  The only thing different is this perforation/peritonitis issue.  Question scar tissue.  I think a single column barium enema could be useful.  We will check with surgery to see what they think.  Seeing a colorectal surgeon could potentially be useful though I am not sure having surgery would be in his best interest to be long as he does well.  He is not symptomatic from this he is maintained on MiraLAX without difficulty.  He will continue PPI for gastritis.  CC: Leonard Downing, MD Dr. Michaelle Birks  Subjective:   Chief Complaint: Follow-up after colonoscopy and endoscopy with sigmoid stricture and gastritis  HPI The patient is here with his wife, recall that he has a history of rectal adenocarcinoma years ago and presented to me last fall with abdominal pain issues and prior to getting his endoscopic evaluation suffered a small bowel perforation that was cared for with resection and percutaneous drain by Dr. Zenia Resides.  He had a postoperative small bowel obstruction and was in and out of the hospital a couple of times but eventually recovered.  This part of the follow-up exam he had an EGD and an attempted a colonoscopy last month.  EGD showed gastritis without H. pylori.  He takes PPI he is asymptomatic.  I was unable to advance beyond the distal sigmoid at colonoscopy despite abdominal pressure and using the ultraslim scope.  I had him do a CT scan of the abdomen and pelvis which showed some persistent mild distention of proximal  small bowel, large volume of stool in the colon, decreased rectal wall thickening but no cause of the stricture identification of the stricture that was suspected based upon the inability to pass the colonoscope.  He saw Dr. Zenia Resides in follow-up and is progressing well she plans to see him in a month after that visit which was in late January, and is considering having him see the colorectal surgeons about the stricture.  She postulated that it might of been from radiation. No Known Allergies Current Meds  Medication Sig   acetaminophen (TYLENOL) 500 MG tablet Take 2 tablets (1,000 mg total) by mouth every 6 (six) hours as needed.   dicyclomine (BENTYL) 20 MG tablet Take 1 tablet (20 mg total) by mouth 3 (three) times daily as needed for spasms (before meals).   hydrochlorothiazide (HYDRODIURIL) 50 MG tablet Take 1/2 of a tablet by mouth every 24 hours for hypertension.   Multiple Vitamin (MULTIVITAMIN WITH MINERALS) TABS tablet Take 1 tablet by mouth in the morning.   omeprazole (PRILOSEC) 20 MG capsule Take 20 mg by mouth in the morning.   polyethylene glycol (MIRALAX / GLYCOLAX) 17 g packet Take 17 g by mouth in the morning.   simethicone (MYLICON) 80 MG chewable tablet Chew 1 tablet (80 mg total) by mouth 4 (four) times daily as needed for flatulence.   timolol (TIMOPTIC) 0.5 % ophthalmic solution Place 1 drop into the left eye every morning.    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   Hypertension    Iron deficiency    Legally blind in left eye, as defined in [place]    per pt only see light   PFO (patent foramen ovale)    per TEE done 05-11-2009     Rectal adenocarcinoma ([place]) 09/28/2016   Traumatic glaucoma, 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 at [facility] in [place]   08-18-2001   Wears glasses    Past Surgical History:  Procedure Laterality Date   ARTHROSCOPIC REPAIR ACL Right 1998   BIOPSY  03/16/2021   Procedure: BIOPSY;  Surgeon: Gatha Mayer, MD;  Location: [facility];  Service: Endoscopy;;   ESOPHAGOGASTRODUODENOSCOPY (EGD) WITH PROPOFOL N/A 03/16/2021   Procedure: ESOPHAGOGASTRODUODENOSCOPY (EGD) WITH PROPOFOL;  Surgeon: Gatha Mayer, MD;  Location: [facility];  Service: Endoscopy;  Laterality: N/A;   EXPLORATION AND REPAIR LEFT EYE INJURY  08-18-2001   [facility]   ruptured globe- repair corneal laceration, reposition of prolapsed uveal tissue   HEMORRHOID SURGERY N/A 09/01/2016   Procedure: HEMORRHOIDECTOMY;  Surgeon: Leighton Ruff, MD;  Location: [facility];  Service: General;  Laterality: N/A;   LAPAROTOMY N/A 12/04/2020   Procedure: EXPLORATORY LAPAROTOMY small bowel resection;  Surgeon: Dwan Bolt, MD;  Location: [facility];  Service: General;  Laterality: N/A;   SUPERFICIAL KERATECTOMY Left 06-29-2011    [facility]    with EDTA scrub of left eye   TEE WITHOUT CARDIOVERSION  05-11-2009  dr Dorris Carnes   LVSEF 55-65%/  mild thickened AV without AI/  trace MR/ mixed fixed artherosclerosis plaqueing thoracic aorta/  no evidence thrombus/  by agitated saling and color doppler there was a PFO   Social History   Social History Narrative   The patient is married no children   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   Review of Systems See above  Objective:   Physical Exam BP 110/70    Pulse 75    Ht 5\' 11"  (1.803 m)    Wt 124 lb 8 oz (56.5 kg)    BMI 17.36 kg/m

## 2021-04-15 ENCOUNTER — Other Ambulatory Visit (HOSPITAL_COMMUNITY): Payer: Self-pay

## 2021-04-27 DIAGNOSIS — T8189XA Other complications of procedures, not elsewhere classified, initial encounter: Secondary | ICD-10-CM | POA: Diagnosis not present

## 2021-04-27 DIAGNOSIS — Z9049 Acquired absence of other specified parts of digestive tract: Secondary | ICD-10-CM | POA: Diagnosis not present

## 2021-04-27 DIAGNOSIS — K56699 Other intestinal obstruction unspecified as to partial versus complete obstruction: Secondary | ICD-10-CM | POA: Diagnosis not present

## 2021-06-04 ENCOUNTER — Other Ambulatory Visit (HOSPITAL_COMMUNITY): Payer: Self-pay

## 2021-06-04 MED ORDER — HYDROCHLOROTHIAZIDE 50 MG PO TABS
ORAL_TABLET | ORAL | 0 refills | Status: DC
  Filled 2021-06-04: qty 45, 90d supply, fill #0
  Filled 2021-09-02: qty 45, 90d supply, fill #1

## 2021-06-21 ENCOUNTER — Other Ambulatory Visit: Payer: Self-pay | Admitting: *Deleted

## 2021-06-21 NOTE — Patient Outreach (Signed)
Juan David) Care Management ?Telephonic RN Care Manager Note ? ? ?06/21/2021 ?Name:  [name] MRN:  [id number] DOB:  Jul 04, 1955 ? ?Summary: ?THN Unsuccessful outreaches to the home and pt's mobile numbers ?Outreach attempts to the listed at the preferred outreach number in [facility] & [phone] ?No answer. THN RN CM left HIPAA Juan David Portability and Accountability Act) compliant voicemail messages along with CM?s contact info.  ? ?Subjective: ?Juan David is an 66 y.o. year old male who is a primary patient of Leonard Downing, MD. The care management team was consulted for assistance with care management and/or care coordination needs.   ? ?Telephonic RN Care Manager completed Telephone Visit today.  ? ?Objective: ? ?Medications Reviewed Today   ? ? Reviewed by Gatha Mayer, MD (Physician) on 04/08/21 at 1005  Med List Status: <None>  ? ?Medication Order Taking? Sig Documenting Provider Last Dose Status Informant  ?acetaminophen (TYLENOL) 500 MG tablet [id number] Yes Take 2 tablets (1,000 mg total) by mouth every 6 (six) hours as needed. Saverio Danker, PA-C Taking Active Spouse/Significant Other  ?dicyclomine (BENTYL) 20 MG tablet [id number] Yes Take 1 tablet (20 mg total) by mouth 3 (three) times daily as needed for spasms (before meals). Gatha Mayer, MD Taking Active Spouse/Significant Other  ?hydrochlorothiazide (HYDRODIURIL) 50 MG tablet [id number] Yes Take 1/2 of a tablet by mouth every 24 hours for hypertension.  Taking Active Spouse/Significant Other  ?Multiple Vitamin (MULTIVITAMIN WITH MINERALS) TABS tablet [id number] Yes Take 1 tablet by mouth in the morning. [provider] Taking Active Spouse/Significant Other  ?omeprazole (PRILOSEC) 20 MG capsule [id number] Yes Take 20 mg by mouth in the morning. [provider] Taking Active Spouse/Significant Other  ?polyethylene glycol (MIRALAX / GLYCOLAX) 17 g packet [id number] Yes Take 17 g  by mouth in the morning. [provider] Taking Active Spouse/Significant Other  ?simethicone (MYLICON) 80 MG chewable tablet [id number] Yes Chew 1 tablet (80 mg total) by mouth 4 (four) times daily as needed for flatulence. Winferd Humphrey, PA-C Taking Active Spouse/Significant Other  ?         ?Med Note Juan David, Juan David   Tue Mar 09, 2021  9:59 AM) On hand  ?timolol (TIMOPTIC) 0.5 % ophthalmic solution [id number] Yes Place 1 drop into the left eye every morning.  [provider] Taking Active Spouse/Significant Other  ?         ?Med Note Juan David   Fri Dec 04, 2020 10:55 AM)    ? ?  ?  ? ?  ? ? ? ?SDOH:  (Social Determinants of Health) assessments and interventions performed:  ? ? ?Care Plan ? ?Review of patient past medical history, allergies, medications, health status, including review of consultants reports, laboratory and other test data, was performed as part of comprehensive evaluation for care management services.  ? ?There are no care plans that you recently modified to display for this patient. ?  ? ?Plan: The patient has been provided with contact information for the care management team and has been advised to call with any health related questions or concerns.  ?The care management team will reach out to the patient again over the next 30+ business days. ? ?Jermario Kalmar L. Lavina Hamman, RN, BSN, CCM ?[place] Management Care Coordinator ?Office number [phone] ?Main [facility] number [phone] ?Fax number [phone] ? ? ? ? ? ?

## 2021-07-12 ENCOUNTER — Other Ambulatory Visit: Payer: Self-pay | Admitting: *Deleted

## 2021-07-12 NOTE — Patient Outreach (Signed)
[facility]) Care Management ?Telephonic RN Care Manager Note ? ? ?07/12/2021 ?Name:  MOHSEN ODENTHAL MRN:  [id number] DOB:  [birth year] ? ?Summary: ?Second THN Unsuccessful outreaches to the home and pt's mobile numbers ?Outreach attempts to the listed at the preferred outreach number in [facility] & [phone] ?No answer. THN RN CM left HIPAA [facility] Portability and Accountability Act) compliant voicemail messages along with CM?s contact info. ? ? ?Subjective: ?Juan David is an 66 y.o. year old male who is a primary patient of Leonard Downing, MD. The care management team was consulted for assistance with care management and/or care coordination needs.   ? ?Telephonic RN Care Manager completed Telephone Visit today.  ? ?Objective: ? ?Medications Reviewed Today   ? ? Reviewed by Gatha Mayer, MD (Physician) on 04/08/21 at 1005  Med List Status: <None>  ? ?Medication Order Taking? Sig Documenting Provider Last Dose Status Informant  ?acetaminophen (TYLENOL) 500 MG tablet [id number] Yes Take 2 tablets (1,000 mg total) by mouth every 6 (six) hours as needed. Saverio Danker, PA-C Taking Active Spouse/Significant Other  ?dicyclomine (BENTYL) 20 MG tablet [id number] Yes Take 1 tablet (20 mg total) by mouth 3 (three) times daily as needed for spasms (before meals). Gatha Mayer, MD Taking Active Spouse/Significant Other  ?hydrochlorothiazide (HYDRODIURIL) 50 MG tablet [id number] Yes Take 1/2 of a tablet by mouth every 24 hours for hypertension.  Taking Active Spouse/Significant Other  ?Multiple Vitamin (MULTIVITAMIN WITH MINERALS) TABS tablet [id number] Yes Take 1 tablet by mouth in the morning. [provider] Taking Active Spouse/Significant Other  ?omeprazole (PRILOSEC) 20 MG capsule [id number] Yes Take 20 mg by mouth in the morning. [provider] Taking Active Spouse/Significant Other  ?polyethylene glycol (MIRALAX / GLYCOLAX) 17 g packet [id number] Yes  Take 17 g by mouth in the morning. [provider] Taking Active Spouse/Significant Other  ?simethicone (MYLICON) 80 MG chewable tablet [id number] Yes Chew 1 tablet (80 mg total) by mouth 4 (four) times daily as needed for flatulence. Winferd Humphrey, PA-C Taking Active Spouse/Significant Other  ?         ?Med Note Kenton Kingfisher, Earley Favor   Tue Mar 09, 2021  9:59 AM) On hand  ?timolol (TIMOPTIC) 0.5 % ophthalmic solution [id number] Yes Place 1 drop into the left eye every morning.  [provider] Taking Active Spouse/Significant Other  ?         ?Med Note Tery Sanfilippo   Fri Dec 04, 2020 10:55 AM)    ? ?  ?  ? ?  ? ? ? ?SDOH:  (Social Determinants of Health) assessments and interventions performed:  ? ? ?Care Plan ? ?Review of patient past medical history, allergies, medications, health status, including review of consultants reports, laboratory and other test data, was performed as part of comprehensive evaluation for care management services.  ? ?There are no care plans that you recently modified to display for this patient. ?  ? ?Plan: The patient has been provided with contact information for the care management team and has been advised to call with any health related questions or concerns.  ?The care management team will reach out to the patient again over the next 30+ business days. ?Unsuccessful outreaches 06/21/21, 07/12/21 ?Unsuccessful outreach letter mailed 07/12/21 ? ? ?Makenah Karas L. Lavina Hamman, RN, BSN, CCM ?[place] Management Care Coordinator ?Office number [phone] ?Main [facility] number [phone] ?Fax number [phone] ? ? ? ? ? ?

## 2021-07-19 ENCOUNTER — Other Ambulatory Visit: Payer: Self-pay | Admitting: *Deleted

## 2021-07-19 ENCOUNTER — Inpatient Hospital Stay: Payer: 59

## 2021-07-19 ENCOUNTER — Inpatient Hospital Stay: Payer: 59 | Attending: Hematology | Admitting: Hematology

## 2021-07-19 DIAGNOSIS — C20 Malignant neoplasm of rectum: Secondary | ICD-10-CM

## 2021-07-19 NOTE — Patient Outreach (Signed)
[facility]) Care Management ? ?07/19/2021 ? ?Sheliah Mends ?05-27-1955 ?[id number] ? ? ?Summary: ?Third THN Unsuccessful outreaches to the home and pt's mobile numbers ?Outreach attempts to the listed at the preferred outreach number in [facility] [phone] ?No answer. THN RN CM left HIPAA [facility] Portability and Accountability Act) compliant voicemail messages along with CM?s contact info. ?  ?  ?Subjective: ?Juan David is an 66 y.o. year old male who is a primary patient of Leonard Downing, MD. The care management team was consulted for assistance with care management and/or care coordination needs.   ?  ?Telephonic RN Care Manager completed Telephone Visit today.  ? ? ?Plan pending patient for case closure  ?Unsuccessful outreaches 06/21/21, 07/12/21, 07/19/21 ?Unsuccessful outreach letter mailed 07/12/21 ? ?Eldridge Marcott L. Lavina Hamman, RN, BSN, CCM ?[place] Management Care Coordinator ?Office number [phone] ?Main [facility] number [phone] ?Fax number [phone] ? ?

## 2021-08-11 ENCOUNTER — Other Ambulatory Visit: Payer: Self-pay | Admitting: *Deleted

## 2021-08-11 NOTE — Patient Outreach (Signed)
[facility]) Care Management  08/11/2021  WITTEN CERTAIN 07-22-1955 [id number]   THN Case closure unable to maintain contact  Unsuccessful outreaches 06/21/21, 07/12/21, 07/19/21 Unsuccessful outreach letter mailed 07/12/21 without a response   Plan THN RN CM will close case after no response from patient within 10+ business days. Unable to maintain contact Case closure letters sent to patient and MD  Care Plan : [place] of Care  Updates made by Barbaraann Faster, RN since 08/11/2021 12:00 AM  Completed 08/11/2021   Problem: CHL AMB "PATIENT-SPECIFIC PROBLEM"Complex Care Coordination Needs and disease management in patient with SBO, HTN Resolved 08/11/2021  Priority: High     Long-Range Goal: Establish Plan of Care for Management Complex SDOH Barriers, disease management and Care Coordination Needs in patient with SBO, HTN Completed 08/11/2021  Start Date: 02/08/2021  Recent Progress: On track  Priority: High  Note:   Current Barriers:  Knowledge Deficits related to plan of care for management of HTN and SBO  Care Coordination needs related to Limited education about HTN, SBO* 02/15/21 Patient reports improvements gradually in nutrition as he continues to feel full when taking in 4 small meals and ensure during the day but denies nausea and vomiting episodes 02/23/21, 06/21/21 07/12/21 unsuccessful outreach Message left for patient 02/25/21 patient with worsening GI symptoms, weakness and sleep disturbances 03/23/21 doing better Returned to work 03/22/21 4 hour days (tolerable) Agrees quarterly outreaches Unsuccessful outreaches 06/21/21, 07/12/21, 07/19/21 Unsuccessful outreach letter mailed 07/12/21 without a response   Plan THN RN CM will close case after no response from patient within 10+ business days. Unable to maintain contact  RN CM Clinical Goal(s):  Patient will demonstrate Improved adherence to prescribed treatment plan for HTN and SBO as evidenced by  prevention of re admission  through collaboration with RN Care manager, provider, and care team.  03/23/21 goal met re admission prevention  Interventions: Further follow up outreaches for assessment of worsening symptoms, care coordination and disease management needs Inter-disciplinary care team collaboration (see longitudinal plan of care) Evaluation of current treatment plan related to  self management and patient's adherence to plan as established by provider   Hypertension Interventions:  (Status:  Goal on track:  Yes.) Long Term Goal-continues with BP elevations 03/23/21 scheduled for CT at 1700 Continue home HTN management Last practice recorded BP readings:  BP Readings from Last 3 Encounters:  04/08/21 110/70  03/16/21 (!) 160/95  02/03/21 122/88  Most recent eGFR/CrCl:  Lab Results  Component Value Date   EGFR >60 12/27/2016    No components found for: CRCL  Evaluation of current treatment plan related to hypertension self management and patient's adherence to plan as established by provider Reviewed medications with patient and discussed importance of compliance Discussed plans with patient for ongoing care management follow up and provided patient with direct contact information for care management team Assessed social determinant of health barriers Reviewed [facility] resources SW, RN, NP, transitions of care services to include care coordination and disease management Encouragement provided   Surgery (SBO-small bowel resection):  (Status: Goal on track:  Yes.) Short Term Goal Evaluation of current treatment plan related to  small bowel resection surgery reviewed scheduled provider appointments with patient-  Completed nutrition assessment  Patient Goals/Self-Care Activities: Take all medications as prescribed Attend all scheduled provider appointments Attend church or other social activities Perform all self care activities independently  Perform IADL's (shopping,  preparing meals, housekeeping, managing finances) independently Call provider  office for new concerns or questions   Follow Up Plan:  The patient has been provided with contact information for the care management team and has been advised to call with any health related questions or concerns.  No further follow up required: Plan [facility] RN CM will close case after no response from patient within 10+ business days.       Marvel Mcphillips L. Lavina Hamman, RN, BSN, [place] Coordinator Office number [phone] Main [facility] number [phone] Fax number [phone]

## 2021-08-18 DIAGNOSIS — K56699 Other intestinal obstruction unspecified as to partial versus complete obstruction: Secondary | ICD-10-CM | POA: Diagnosis not present

## 2021-09-01 DIAGNOSIS — K5730 Diverticulosis of large intestine without perforation or abscess without bleeding: Secondary | ICD-10-CM | POA: Diagnosis not present

## 2021-09-01 DIAGNOSIS — D125 Benign neoplasm of sigmoid colon: Secondary | ICD-10-CM | POA: Diagnosis not present

## 2021-09-01 DIAGNOSIS — I10 Essential (primary) hypertension: Secondary | ICD-10-CM | POA: Diagnosis not present

## 2021-09-01 DIAGNOSIS — Q438 Other specified congenital malformations of intestine: Secondary | ICD-10-CM | POA: Diagnosis not present

## 2021-09-01 DIAGNOSIS — Q439 Congenital malformation of intestine, unspecified: Secondary | ICD-10-CM | POA: Diagnosis not present

## 2021-09-01 DIAGNOSIS — Z85048 Personal history of other malignant neoplasm of rectum, rectosigmoid junction, and anus: Secondary | ICD-10-CM | POA: Diagnosis not present

## 2021-09-01 DIAGNOSIS — Z1211 Encounter for screening for malignant neoplasm of colon: Secondary | ICD-10-CM | POA: Diagnosis not present

## 2021-09-01 DIAGNOSIS — K635 Polyp of colon: Secondary | ICD-10-CM | POA: Diagnosis not present

## 2021-09-02 ENCOUNTER — Other Ambulatory Visit (HOSPITAL_COMMUNITY): Payer: Self-pay

## 2021-10-01 ENCOUNTER — Other Ambulatory Visit (HOSPITAL_COMMUNITY): Payer: Self-pay

## 2021-10-01 DIAGNOSIS — H18422 Band keratopathy, left eye: Secondary | ICD-10-CM | POA: Diagnosis not present

## 2021-10-01 DIAGNOSIS — H33052 Total retinal detachment, left eye: Secondary | ICD-10-CM | POA: Diagnosis not present

## 2021-10-01 DIAGNOSIS — S0532XD Ocular laceration without prolapse or loss of intraocular tissue, left eye, subsequent encounter: Secondary | ICD-10-CM | POA: Diagnosis not present

## 2021-10-01 DIAGNOSIS — S0532XS Ocular laceration without prolapse or loss of intraocular tissue, left eye, sequela: Secondary | ICD-10-CM | POA: Diagnosis not present

## 2021-10-01 DIAGNOSIS — H4032X4 Glaucoma secondary to eye trauma, left eye, indeterminate stage: Secondary | ICD-10-CM | POA: Diagnosis not present

## 2021-10-01 DIAGNOSIS — Q825 Congenital non-neoplastic nevus: Secondary | ICD-10-CM | POA: Diagnosis not present

## 2021-10-01 MED ORDER — TIMOLOL MALEATE 0.5 % OP SOLN
OPHTHALMIC | 11 refills | Status: DC
  Filled 2021-10-01: qty 5, 80d supply, fill #0
  Filled 2022-03-10: qty 5, 80d supply, fill #1
  Filled 2022-07-16: qty 5, 80d supply, fill #2
  Filled 2022-09-02 – 2022-09-19 (×3): qty 5, 80d supply, fill #3

## 2021-11-28 ENCOUNTER — Other Ambulatory Visit (HOSPITAL_COMMUNITY): Payer: Self-pay

## 2021-12-01 ENCOUNTER — Other Ambulatory Visit (HOSPITAL_COMMUNITY): Payer: Self-pay

## 2021-12-02 ENCOUNTER — Other Ambulatory Visit (HOSPITAL_COMMUNITY): Payer: Self-pay

## 2021-12-02 MED ORDER — HYDROCHLOROTHIAZIDE 50 MG PO TABS
25.0000 mg | ORAL_TABLET | Freq: Every day | ORAL | 0 refills | Status: DC
  Filled 2021-12-02: qty 45, 90d supply, fill #0
  Filled 2022-03-30 – 2022-05-25 (×2): qty 45, 90d supply, fill #1

## 2021-12-03 ENCOUNTER — Other Ambulatory Visit (HOSPITAL_COMMUNITY): Payer: Self-pay

## 2021-12-03 DIAGNOSIS — Z0000 Encounter for general adult medical examination without abnormal findings: Secondary | ICD-10-CM | POA: Diagnosis not present

## 2021-12-03 DIAGNOSIS — Z125 Encounter for screening for malignant neoplasm of prostate: Secondary | ICD-10-CM | POA: Diagnosis not present

## 2021-12-03 DIAGNOSIS — I10 Essential (primary) hypertension: Secondary | ICD-10-CM | POA: Diagnosis not present

## 2021-12-03 DIAGNOSIS — Z23 Encounter for immunization: Secondary | ICD-10-CM | POA: Diagnosis not present

## 2021-12-03 MED ORDER — HYDROCHLOROTHIAZIDE 25 MG PO TABS
25.0000 mg | ORAL_TABLET | Freq: Every day | ORAL | 3 refills | Status: DC
  Filled 2021-12-03: qty 90, 90d supply, fill #0

## 2021-12-16 DIAGNOSIS — D649 Anemia, unspecified: Secondary | ICD-10-CM | POA: Diagnosis not present

## 2021-12-16 DIAGNOSIS — E875 Hyperkalemia: Secondary | ICD-10-CM | POA: Diagnosis not present

## 2021-12-16 DIAGNOSIS — R799 Abnormal finding of blood chemistry, unspecified: Secondary | ICD-10-CM | POA: Diagnosis not present

## 2022-01-11 DIAGNOSIS — R7989 Other specified abnormal findings of blood chemistry: Secondary | ICD-10-CM | POA: Diagnosis not present

## 2022-02-17 NOTE — Progress Notes (Signed)
[place]   [phone] Fax:(336) [phone]   Clinic Follow up Note   Patient Care Team: Leonard Downing, MD as PCP - General (Family Medicine) Leighton Ruff, MD as Consulting Physician (General Surgery) Kyung Rudd, MD as Consulting Physician (Radiation Oncology) Truitt Merle, MD as Consulting Physician (Hematology) Gatha Mayer, MD as Consulting Physician (Gastroenterology) Dwan Bolt, MD as Consulting Physician (General Surgery)  Date of Service:  02/18/2022  CHIEF COMPLAINT: f/u of rectal cancer and recent surgery    CURRENT THERAPY:  Surveillance   ASSESSMENT:  Juan David is a 66 y.o. 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 Marcello Moores, surgical path reviewed a 1.3 cm adenocarcinoma with invasion into anal sphincter muscle.  -Pelvic MRI 09/22/16 showed no residual tumor and no adenopathy.  -Patient completed adjuvant concurrent chemo and radiation 10/10/16-11/22/16, tolerated well overall. -repeated colonoscopy in 03/2021 showed Stricture in the distal sigmoid colon, scope was not able to pass, not malignant appearing, not biopsied.   High serum ferritin -Per patient, he had a history of iron deficiency in the past and was taking oral iron for over 20 years. -He was found to have significantly elevated ferritin since earlier 2022, and repeated ferritin level still above 2000 in 2023.  Hemochromatosis genetic testing was negative -I discussed this could be related to chronic inflammation.  To rule out iron over load, I recommend liver MRI.  -If liver MRI shows evidence of iron overload in liver, I would recommend phlebotomy.     PLAN: -Has completed 5 years of colon cancer surveillance.  - Discuss his iron lab results  -Recommend MRI of liver  -f/u as needed   SUMMARY OF ONCOLOGIC HISTORY: Oncology History Overview Note  Cancer Staging Rectal adenocarcinoma  [facility]) Staging form: Colon and Rectum, AJCC 8th Edition - Pathologic stage from 09/01/2016: Stage I (pT2, pN0, cM0) - Signed by Truitt Merle, MD on 10/01/2016     Adenocarcinoma of rectum ([place])  09/01/2016 Initial Diagnosis   Rectal adenocarcinoma ([place])   09/01/2016 Surgery   Hemorrhoidectomy 09/01/2016. Noted to have a mobile right lateral anal canal mass. Dr. Leighton Ruff.    09/01/2016 Pathology Results   Invasive adenocarcinoma, well-differentiated, spanning 1.3 cm. The tumor invaded into the anal sphincter muscle. Resection margins were negative. The pathologist notes the tumor would be best staged as pT2 given involvement of muscle.    09/06/2016 Imaging   CT scans chest/abdomen/pelvis 09/06/2016 showed no evidence of metastatic disease.    09/22/2016 Imaging   Pelvic MRI 09/22/2016 showed no definite residual anal tumor. There was no evidence of tumor involvement of the external sphincter or adjacent organs. There was no pelvic lymphadenopathy.   10/10/2016 - 11/22/2016 Radiation Therapy   Patient begun adjuvant radiation with Dr. Lisbeth Renshaw   10/10/2016 - 11/22/2016 Chemotherapy   xeloda '1500mg'$ , twice daily.   11/24/2017 Imaging   CT CAP W contrast 11/24/17  IMPRESSION: 1. Stable exam. No new or progressive interval findings to suggest metastatic disease. 2.  Aortic Atherosclerois (ICD10-170.0) 3.  Emphysema. ([id number]-K93.9)       INTERVAL HISTORY:  Juan David is here for a follow up ofrectal cancer and recent surgery    He was last seen by me on 01/18/21 He presents to the clinic accompanied by wife. Pt states he is doing well. He is eating well. No bowel issue.    All other systems were reviewed with the patient  and are negative.  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   Hypertension    Iron deficiency    Legally blind in left eye, as defined in [place]    per pt only see light   PFO (patent foramen ovale)    per TEE done 05-11-2009    Rectal adenocarcinoma ([place]) 09/28/2016   Traumatic glaucoma, 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 at [facility] in [place]   08-18-2001   Wears glasses     SURGICAL HISTORY: Past Surgical History:  Procedure Laterality Date   ARTHROSCOPIC REPAIR ACL Right 1998   BIOPSY  03/16/2021   Procedure: BIOPSY;  Surgeon: Gatha Mayer, MD;  Location: [facility];  Service: Endoscopy;;   ESOPHAGOGASTRODUODENOSCOPY (EGD) WITH PROPOFOL N/A 03/16/2021   Procedure: ESOPHAGOGASTRODUODENOSCOPY (EGD) WITH PROPOFOL;  Surgeon: Gatha Mayer, MD;  Location: [facility];  Service: Endoscopy;  Laterality: N/A;   EXPLORATION AND REPAIR LEFT EYE INJURY  08-18-2001   [place]   ruptured globe- repair corneal laceration, reposition of prolapsed uveal tissue   HEMORRHOID SURGERY N/A 09/01/2016   Procedure: HEMORRHOIDECTOMY;  Surgeon: Leighton Ruff, MD;  Location: [facility];  Service: General;  Laterality: N/A;   LAPAROTOMY N/A 12/04/2020   Procedure: EXPLORATORY LAPAROTOMY small bowel resection;  Surgeon: Dwan Bolt, MD;  Location: [place];  Service: General;  Laterality: N/A;   SUPERFICIAL KERATECTOMY Left 06-29-2011    [facility]    with EDTA scrub of left eye   TEE WITHOUT CARDIOVERSION  05-11-2009  dr Dorris Carnes   LVSEF 55-65%/  mild thickened AV without AI/  trace MR/ mixed fixed artherosclerosis plaqueing thoracic aorta/  no evidence thrombus/  by agitated saling and color doppler there was a PFO    I have reviewed the social history and family history with the patient and they are unchanged from previous note.  ALLERGIES:  has No Known  Allergies.  MEDICATIONS:  Current Outpatient Medications  Medication Sig Dispense Refill   acetaminophen (TYLENOL) 500 MG tablet Take 2 tablets (1,000 mg total) by mouth every 6 (six) hours as needed. 30 tablet 0   hydrochlorothiazide (HYDRODIURIL) 50 MG tablet Take 1/2 tablet (25 mg total) by mouth daily. 90 tablet 0   Multiple Vitamin (MULTIVITAMIN WITH MINERALS) TABS tablet Take 1 tablet by mouth in the morning.     polyethylene glycol (MIRALAX / GLYCOLAX) 17 g packet Take 17 g by mouth in the morning.     timolol (TIMOPTIC) 0.5 % ophthalmic solution Place 1 drop into the left eye every morning.   11   timolol (TIMOPTIC) 0.5 % ophthalmic solution Place 1 drop into the left eye daily. 10 mL 11   No current facility-administered medications for this visit.    PHYSICAL EXAMINATION: ECOG PERFORMANCE STATUS: 0 - Asymptomatic  Vitals:   02/18/22 1416  BP: 139/85  Pulse: 82  Resp: 16  Temp: 98.4 F (36.9 C)  SpO2: 98%   Wt Readings from Last 3 Encounters:  02/18/22  131 lb 11.2 oz (59.7 kg)  04/08/21 124 lb 8 oz (56.5 kg)  03/16/21 116 lb (52.6 kg)     GENERAL:alert, no distress and comfortable SKIN: skin color, texture, turgor are normal, no rashes or significant lesions EYES: normal, Conjunctiva are pink and non-injected, sclera clear  NECK: supple, thyroid normal size, non-tender, without nodularity LYMPH:  no palpable lymphadenopathy in the cervical, axillary  ABDOMEN:abdomen soft, non-tender and normal bowel (-) Musculoskeletal:no cyanosis of digits and no clubbing  NEURO: alert & oriented x 3 with fluent speech, no focal motor/sensory deficits RECTAL: hemorrhoids, no palpable mass. Exam benign   LABORATORY DATA:  I have reviewed the data as listed    Latest Ref Rng & Units 03/16/2021   11:32 AM 01/30/2021    3:18 AM 01/25/2021    2:00 AM  CBC  WBC 4.0 - 10.5 K/uL 5.7  8.1  6.2   Hemoglobin 13.0 - 17.0 g/dL 12.6  9.8  10.6   Hematocrit 39.0 - 52.0 % 39.3  30.1   32.0   Platelets 150 - 400 K/uL 258  260  198         Latest Ref Rng & Units 03/16/2021   11:32 AM 02/01/2021    4:31 AM 01/30/2021    3:18 AM  CMP  Glucose 70 - 99 mg/dL 82  104  119   BUN 8 - 23 mg/dL '19  18  21   '$ Creatinine 0.61 - 1.24 mg/dL 1.01  0.57  0.57   Sodium 135 - 145 mmol/L 134  134  133   Potassium 3.5 - 5.1 mmol/L 4.6  4.1  4.3   Chloride 98 - 111 mmol/L 99  103  101   CO2 22 - 32 mmol/L '22  27  25   '$ Calcium 8.9 - 10.3 mg/dL 9.8  8.7  9.0   Total Protein 6.5 - 8.1 g/dL 8.1  6.0  6.6   Total Bilirubin 0.3 - 1.2 mg/dL 0.8  0.2  0.2   Alkaline Phos 38 - 126 U/L 82  77  100   AST 15 - 41 U/L 23  22  36   ALT 0 - 44 U/L 24  57  105       RADIOGRAPHIC STUDIES: I have personally reviewed the radiological images as listed and agreed with the findings in the report. No results found.    Orders Placed This Encounter  Procedures   MR Liver Wo Contrast    Standing Status:   Future    Standing Expiration Date:   02/18/2023    Order Specific Question:   What is the patient's sedation requirement?    Answer:   No Sedation    Order Specific Question:   Does the patient have a pacemaker or implanted devices?    Answer:   No    Order Specific Question:   Preferred imaging location?    Answer:   [facility] (table limit - 550 lbs)    Order Specific Question:   Release to patient    Answer:   Immediate   All questions were answered. The patient knows to call the clinic with any problems, questions or concerns. No barriers to learning was detected. The total time spent in the appointment was 30 minutes.     Truitt Merle, MD 02/18/2022   Felicity Coyer, CMA, am acting as scribe for Truitt Merle, MD.   I have reviewed the above documentation for accuracy and completeness, and  I agree with the above.

## 2022-02-18 ENCOUNTER — Inpatient Hospital Stay: Payer: 59 | Attending: Hematology | Admitting: Hematology

## 2022-02-18 ENCOUNTER — Encounter: Payer: Self-pay | Admitting: Hematology

## 2022-02-18 ENCOUNTER — Other Ambulatory Visit: Payer: Self-pay

## 2022-02-18 VITALS — BP 139/85 | HR 82 | Temp 98.4°F | Resp 16 | Wt 131.7 lb

## 2022-02-18 DIAGNOSIS — Z85048 Personal history of other malignant neoplasm of rectum, rectosigmoid junction, and anus: Secondary | ICD-10-CM | POA: Insufficient documentation

## 2022-02-18 DIAGNOSIS — Z923 Personal history of irradiation: Secondary | ICD-10-CM | POA: Insufficient documentation

## 2022-02-18 DIAGNOSIS — R7989 Other specified abnormal findings of blood chemistry: Secondary | ICD-10-CM | POA: Diagnosis not present

## 2022-02-18 DIAGNOSIS — C20 Malignant neoplasm of rectum: Secondary | ICD-10-CM | POA: Diagnosis not present

## 2022-02-18 DIAGNOSIS — Z79899 Other long term (current) drug therapy: Secondary | ICD-10-CM | POA: Insufficient documentation

## 2022-02-18 DIAGNOSIS — I10 Essential (primary) hypertension: Secondary | ICD-10-CM | POA: Insufficient documentation

## 2022-02-18 DIAGNOSIS — Z86718 Personal history of other venous thrombosis and embolism: Secondary | ICD-10-CM | POA: Diagnosis not present

## 2022-02-18 NOTE — Assessment & Plan Note (Signed)
-  Per patient, he had a history of iron deficiency in the past and was taking oral iron for over 20 years. -He was found to have significantly elevated ferritin since earlier 2022, and repeated ferritin level still above 2000 in 2023.  Hemochromatosis genetic testing was negative -I discussed this could be related to chronic inflammation.  To rule out iron over load, I recommend liver MRI.  -If liver MRI shows evidence of iron overload in liver, I would recommend phlebotomy.

## 2022-02-18 NOTE — Assessment & Plan Note (Addendum)
-  diagnosed in 08/2016, pT2N0 --resection on 09/01/16 by Dr. Marcello Moores, surgical path reviewed a 1.3 cm adenocarcinoma with invasion into anal sphincter muscle.  -Pelvic MRI 09/22/16 showed no residual tumor and no adenopathy.  -Patient completed adjuvant concurrent chemo and radiation 10/10/16-11/22/16, tolerated well overall. -repeated colonoscopy in 03/2021 showed Stricture in the distal sigmoid colon, scope was not able to pass, not malignant appearing, not biopsied.

## 2022-02-25 ENCOUNTER — Encounter: Payer: Self-pay | Admitting: Hematology

## 2022-03-03 ENCOUNTER — Other Ambulatory Visit: Payer: Self-pay

## 2022-03-04 ENCOUNTER — Ambulatory Visit (HOSPITAL_COMMUNITY): Admission: RE | Admit: 2022-03-04 | Payer: 59 | Source: Ambulatory Visit

## 2022-03-08 ENCOUNTER — Ambulatory Visit (HOSPITAL_COMMUNITY)
Admission: RE | Admit: 2022-03-08 | Discharge: 2022-03-08 | Disposition: A | Discharge status: Home / Self Care | Payer: Commercial Managed Care - PPO | Source: Ambulatory Visit | Attending: Hematology | Admitting: Hematology

## 2022-03-08 DIAGNOSIS — I700 Atherosclerosis of aorta: Secondary | ICD-10-CM | POA: Diagnosis not present

## 2022-03-08 DIAGNOSIS — R7989 Other specified abnormal findings of blood chemistry: Secondary | ICD-10-CM | POA: Diagnosis not present

## 2022-03-08 DIAGNOSIS — N281 Cyst of kidney, acquired: Secondary | ICD-10-CM | POA: Diagnosis not present

## 2022-03-08 DIAGNOSIS — Z9049 Acquired absence of other specified parts of digestive tract: Secondary | ICD-10-CM | POA: Diagnosis not present

## 2022-03-08 DIAGNOSIS — K7689 Other specified diseases of liver: Secondary | ICD-10-CM | POA: Diagnosis not present

## 2022-03-08 MED ORDER — GADOBUTROL 1 MMOL/ML IV SOLN
7.0000 mL | Freq: Once | INTRAVENOUS | Status: AC | PRN
  Administered 2022-03-08: 7 mL via INTRAVENOUS

## 2022-03-09 NOTE — Assessment & Plan Note (Signed)
-  Per patient, he had a history of iron deficiency in the past and was taking oral iron for over 20 years. -He was found to have significantly elevated ferritin since earlier 2022, and repeated ferritin level still above 2000 in 2023.  Hemochromatosis genetic testing was negative -I discussed this could be related to chronic inflammation.  To rule out iron over load, I recommend liver MRI.  -abdominal MRI yesterday demonstrated evidence of iron deposit in  liver, spleen, and vertebral bodies  -I recommend phlebotomy until ferritin less than 100

## 2022-03-09 NOTE — Assessment & Plan Note (Signed)
-  diagnosed in 08/2016, pT2N0 --resection on 09/01/16 by Dr. Marcello Moores, surgical path reviewed a 1.3 cm adenocarcinoma with invasion into anal sphincter muscle.  -Pelvic MRI 09/22/16 showed no residual tumor and no adenopathy.  -Patient completed adjuvant concurrent chemo and radiation 10/10/16-11/22/16, tolerated well overall. -repeated colonoscopy in 03/2021 showed Stricture in the distal sigmoid colon, scope was not able to pass, not malignant appearing, not biopsied.

## 2022-03-10 ENCOUNTER — Other Ambulatory Visit (HOSPITAL_COMMUNITY): Payer: Self-pay

## 2022-03-10 ENCOUNTER — Inpatient Hospital Stay: Payer: Commercial Managed Care - PPO | Attending: Hematology | Admitting: Hematology

## 2022-03-10 ENCOUNTER — Encounter: Payer: Self-pay | Admitting: Hematology

## 2022-03-10 DIAGNOSIS — I959 Hypotension, unspecified: Secondary | ICD-10-CM | POA: Insufficient documentation

## 2022-03-10 DIAGNOSIS — Z85048 Personal history of other malignant neoplasm of rectum, rectosigmoid junction, and anus: Secondary | ICD-10-CM | POA: Insufficient documentation

## 2022-03-10 DIAGNOSIS — C20 Malignant neoplasm of rectum: Secondary | ICD-10-CM

## 2022-03-10 DIAGNOSIS — R7989 Other specified abnormal findings of blood chemistry: Secondary | ICD-10-CM

## 2022-03-10 DIAGNOSIS — Z79899 Other long term (current) drug therapy: Secondary | ICD-10-CM | POA: Insufficient documentation

## 2022-03-10 NOTE — Progress Notes (Signed)
[place]   Telephone:(336) [phone] Fax:(336) [phone]   Clinic Follow up Note   Patient Care Team: Leonard Downing, MD as PCP - General (Family Medicine) Leighton Ruff, MD as Consulting Physician (General Surgery) Kyung Rudd, MD as Consulting Physician (Radiation Oncology) Truitt Merle, MD as Consulting Physician (Hematology) Gatha Mayer, MD as Consulting Physician (Gastroenterology) Dwan Bolt, MD as Consulting Physician (General Surgery)  Date of Service:  03/10/2022  I connected with Sheliah Mends on 03/10/2022 at  2:40 PM EST by telephone visit and verified that I am speaking with the correct person using two identifiers.  I discussed the limitations, risks, security and privacy concerns of performing an evaluation and management service by telephone and the availability of in person appointments. I also discussed with the patient that there may be a patient responsible charge related to this service. The patient expressed understanding and agreed to proceed.   Other persons participating in the visit and their role in the encounter:  N/A  Patient's location:  Work Provider's location:  Office  CHIEF COMPLAINT: f/u of  rectal cancer and recent surgery      CURRENT THERAPY: Surveillance    ASSESSMENT & PLAN:  Juan David is a 67 y.o. 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 Marcello Moores, surgical path reviewed a 1.3 cm adenocarcinoma with invasion into anal sphincter muscle.  -Pelvic MRI 09/22/16 showed no residual tumor and no adenopathy.  -Patient completed adjuvant concurrent chemo and radiation 10/10/16-11/22/16, tolerated well overall. -repeated colonoscopy in 03/2021 showed Stricture in the distal sigmoid colon, scope was not able to pass, not malignant appearing, not biopsied.  High serum ferritin -Per patient, he had a history of iron deficiency in the past and was taking oral iron  for over 20 years. -He was found to have significantly elevated ferritin since earlier 2022, and repeated ferritin level still above 2000 in 2023.  Hemochromatosis genetic testing was negative -I discussed this could be related to chronic inflammation.  To rule out iron over load, I recommend liver MRI.  -abdominal MRI yesterday demonstrated evidence of iron deposit in  liver, spleen, and vertebral bodies  -I recommend phlebotomy until ferritin less than 100   PLAN - Discuss scans finding, I recommend phlebotomy -Will start phlebotomy in a few weeks and continue monthly until ferritin<100  -lab and f/u in 3 months  SUMMARY OF ONCOLOGIC HISTORY: Oncology History Overview Note  Cancer Staging Rectal adenocarcinoma [facility]) Staging form: Colon and Rectum, AJCC 8th Edition - Pathologic stage from 09/01/2016: Stage I (pT2, pN0, cM0) - Signed by Truitt Merle, MD on 10/01/2016     Adenocarcinoma of rectum ([place])  09/01/2016 Initial Diagnosis   Rectal adenocarcinoma ([place])   09/01/2016 Surgery   Hemorrhoidectomy 09/01/2016. Noted to have a mobile right lateral anal canal mass. Dr. Leighton Ruff.    09/01/2016 Pathology Results   Invasive adenocarcinoma, well-differentiated, spanning 1.3 cm. The tumor invaded into the anal sphincter muscle. Resection margins were negative. The pathologist notes the tumor would be best staged as pT2 given involvement of muscle.    09/06/2016 Imaging   CT scans chest/abdomen/pelvis 09/06/2016 showed no evidence of metastatic disease.    09/22/2016 Imaging   Pelvic MRI 09/22/2016 showed no definite residual anal tumor. There was no evidence of tumor involvement of the external sphincter or adjacent organs. There was no pelvic lymphadenopathy.   10/10/2016 - 11/22/2016 Radiation Therapy  Patient begun adjuvant radiation with Dr. Lisbeth Renshaw   10/10/2016 - 11/22/2016 Chemotherapy   xeloda '1500mg'$ , twice daily.   11/24/2017 Imaging   CT CAP W contrast 11/24/17  IMPRESSION: 1.  Stable exam. No new or progressive interval findings to suggest metastatic disease. 2.  Aortic Atherosclerois (ICD10-170.0) 3.  Emphysema. ([id number]-R51.9)       INTERVAL HISTORY:  Juan David was contacted for a follow up of rectal cancer and recent surgery   He was last seen by me on 02/18/22. Pt is doing well.   All other systems were reviewed with the patient and are negative.  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 eye, as defined in [place]    per pt only see light   PFO (patent foramen ovale)    per TEE done 05-11-2009    Rectal adenocarcinoma ([place]) 09/28/2016   Traumatic glaucoma, 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 at [facility] in [place]   08-18-2001   Wears glasses     SURGICAL HISTORY: Past Surgical History:  Procedure Laterality Date   ARTHROSCOPIC REPAIR ACL Right 1998   BIOPSY  03/16/2021   Procedure: BIOPSY;  Surgeon: Gatha Mayer, MD;  [facility];  Service: Endoscopy;;   ESOPHAGOGASTRODUODENOSCOPY (EGD) WITH PROPOFOL N/A 03/16/2021   Procedure: ESOPHAGOGASTRODUODENOSCOPY (EGD) WITH PROPOFOL;  Surgeon: Gatha Mayer, MD;  [facility];  Service: Endoscopy;  Laterality: N/A;   EXPLORATION AND REPAIR LEFT EYE INJURY  08-18-2001   [place]   ruptured globe- repair corneal laceration, reposition of prolapsed uveal tissue   HEMORRHOID SURGERY N/A 09/01/2016   Procedure: HEMORRHOIDECTOMY;  Surgeon: Leighton Ruff, MD;  Location: [facility];  Service: General;  Laterality: N/A;    LAPAROTOMY N/A 12/04/2020   Procedure: EXPLORATORY LAPAROTOMY small bowel resection;  Surgeon: Dwan Bolt, MD;  Location: [place];  Service: General;  Laterality: N/A;   SUPERFICIAL KERATECTOMY Left 06-29-2011    [facility]    with EDTA scrub of left eye   TEE WITHOUT CARDIOVERSION  05-11-2009  dr Dorris Carnes   LVSEF 55-65%/  mild thickened AV without AI/  trace MR/ mixed fixed artherosclerosis plaqueing thoracic aorta/  no evidence thrombus/  by agitated saling and color doppler there was a PFO    I have reviewed the social history and family history with the patient and they are unchanged from previous note.  ALLERGIES:  has No Known Allergies.  MEDICATIONS:  Current Outpatient Medications  Medication Sig Dispense Refill   acetaminophen (TYLENOL) 500 MG tablet Take 2 tablets (1,000 mg total) by mouth every 6 (six) hours as needed. 30 tablet 0   hydrochlorothiazide (HYDRODIURIL) 50 MG tablet Take 1/2 tablet (25 mg total) by mouth daily. 90 tablet 0   Multiple Vitamin (MULTIVITAMIN WITH MINERALS) TABS tablet Take 1 tablet by mouth in the morning.     polyethylene glycol (MIRALAX / GLYCOLAX) 17 g packet Take  17 g by mouth in the morning.     timolol (TIMOPTIC) 0.5 % ophthalmic solution Place 1 drop into the left eye every morning.   11   timolol (TIMOPTIC) 0.5 % ophthalmic solution Place 1 drop into the left eye daily. 10 mL 11   No current facility-administered medications for this visit.    PHYSICAL EXAMINATION: ECOG PERFORMANCE STATUS: 0 - Asymptomatic  There were no vitals filed for this visit. Wt Readings from Last 3 Encounters:  02/18/22 131 lb 11.2 oz (59.7 kg)  04/08/21 124 lb 8 oz (56.5 kg)  03/16/21 116 lb (52.6 kg)     No vitals taken today, Exam not performed today  LABORATORY DATA:  I have reviewed the data as listed    Latest Ref Rng & Units 03/16/2021   11:32 AM 01/30/2021    3:18 AM 01/25/2021    2:00 AM  CBC  WBC 4.0 - 10.5 K/uL 5.7  8.1  6.2   Hemoglobin 13.0  - 17.0 g/dL 12.6  9.8  10.6   Hematocrit 39.0 - 52.0 % 39.3  30.1  32.0   Platelets 150 - 400 K/uL 258  260  198         Latest Ref Rng & Units 03/16/2021   11:32 AM 02/01/2021    4:31 AM 01/30/2021    3:18 AM  CMP  Glucose 70 - 99 mg/dL 82  104  119   BUN 8 - 23 mg/dL '19  18  21   '$ Creatinine 0.61 - 1.24 mg/dL 1.01  0.57  0.57   Sodium 135 - 145 mmol/L 134  134  133   Potassium 3.5 - 5.1 mmol/L 4.6  4.1  4.3   Chloride 98 - 111 mmol/L 99  103  101   CO2 22 - 32 mmol/L '22  27  25   '$ Calcium 8.9 - 10.3 mg/dL 9.8  8.7  9.0   Total Protein 6.5 - 8.1 g/dL 8.1  6.0  6.6   Total Bilirubin 0.3 - 1.2 mg/dL 0.8  0.2  0.2   Alkaline Phos 38 - 126 U/L 82  77  100   AST 15 - 41 U/L 23  22  36   ALT 0 - 44 U/L 24  57  105       RADIOGRAPHIC STUDIES: I have personally reviewed the radiological images as listed and agreed with the findings in the report. No results found.    Orders Placed This Encounter  Procedures   CBC with Differential/Platelet    Standing Status:   Standing    Number of Occurrences:   50    Standing Expiration Date:   03/11/2023   Comprehensive metabolic panel    Standing Status:   Standing    Number of Occurrences:   2    Standing Expiration Date:   03/11/2023   Ferritin    Standing Status:   Standing    Number of Occurrences:   20    Standing Expiration Date:   03/11/2023   Hemochromatosis DNA, PCR    Standing Status:   Future    Standing Expiration Date:   03/10/2023   All questions were answered. The patient knows to call the clinic with any problems, questions or concerns. No barriers to learning was detected. The total time spent in the appointment was 15 minutes.     Truitt Merle, MD 03/10/2022   Felicity Coyer am acting as scribe for Truitt Merle, MD.  I have reviewed the above documentation for accuracy and completeness, and I agree with the above.

## 2022-03-28 ENCOUNTER — Inpatient Hospital Stay (HOSPITAL_BASED_OUTPATIENT_CLINIC_OR_DEPARTMENT_OTHER): Payer: Commercial Managed Care - PPO | Admitting: Physician Assistant

## 2022-03-28 ENCOUNTER — Other Ambulatory Visit: Payer: Self-pay

## 2022-03-28 ENCOUNTER — Inpatient Hospital Stay: Payer: Commercial Managed Care - PPO

## 2022-03-28 ENCOUNTER — Other Ambulatory Visit: Payer: Self-pay | Admitting: Hematology

## 2022-03-28 DIAGNOSIS — I959 Hypotension, unspecified: Secondary | ICD-10-CM | POA: Diagnosis not present

## 2022-03-28 DIAGNOSIS — Z79899 Other long term (current) drug therapy: Secondary | ICD-10-CM | POA: Diagnosis not present

## 2022-03-28 DIAGNOSIS — Z85048 Personal history of other malignant neoplasm of rectum, rectosigmoid junction, and anus: Secondary | ICD-10-CM | POA: Diagnosis present

## 2022-03-28 DIAGNOSIS — R55 Syncope and collapse: Secondary | ICD-10-CM

## 2022-03-28 LAB — COMPREHENSIVE METABOLIC PANEL
ALT: 12 U/L (ref 0–44)
AST: 17 U/L (ref 15–41)
Albumin: 3.9 g/dL (ref 3.5–5.0)
Alkaline Phosphatase: 98 U/L (ref 38–126)
Anion gap: 6 (ref 5–15)
BUN: 14 mg/dL (ref 8–23)
CO2: 26 mmol/L (ref 22–32)
Calcium: 9.4 mg/dL (ref 8.9–10.3)
Chloride: 106 mmol/L (ref 98–111)
Creatinine, Ser: 1 mg/dL (ref 0.61–1.24)
GFR, Estimated: >60 mL/min (ref >60)
Glucose, Bld: 74 mg/dL (ref 70–99)
Potassium: 3.2 mmol/L — ABNORMAL LOW (ref 3.5–5.1)
Sodium: 138 mmol/L (ref 135–145)
Total Bilirubin: 0.5 mg/dL (ref 0.3–1.2)
Total Protein: 6.6 g/dL (ref 6.5–8.1)

## 2022-03-28 LAB — CBC WITH DIFFERENTIAL/PLATELET
Abs Immature Granulocytes: 0.01 10*3/uL (ref 0.00–0.07)
Basophils Absolute: 0 10*3/uL (ref 0.0–0.1)
Basophils Relative: 1 %
Eosinophils Absolute: 0.2 10*3/uL (ref 0.0–0.5)
Eosinophils Relative: 4 %
HCT: 39 % (ref 39.0–52.0)
Hemoglobin: 13.1 g/dL (ref 13.0–17.0)
Immature Granulocytes: 0 %
Lymphocytes Relative: 30 %
Lymphs Abs: 1.8 10*3/uL (ref 0.7–4.0)
MCH: 28.5 pg (ref 26.0–34.0)
MCHC: 33.6 g/dL (ref 30.0–36.0)
MCV: 84.8 fL (ref 80.0–100.0)
Monocytes Absolute: 0.6 10*3/uL (ref 0.1–1.0)
Monocytes Relative: 10 %
Neutro Abs: 3.4 10*3/uL (ref 1.7–7.7)
Neutrophils Relative %: 55 %
Platelets: 192 10*3/uL (ref 150–400)
RBC: 4.6 MIL/uL (ref 4.22–5.81)
RDW: 14.5 % (ref 11.5–15.5)
WBC: 6 10*3/uL (ref 4.0–10.5)
nRBC: 0 % (ref 0.0–0.2)

## 2022-03-28 MED ORDER — SODIUM CHLORIDE 0.9 % IV SOLN: Freq: Once | INTRAVENOUS | Status: AC

## 2022-03-28 NOTE — Progress Notes (Signed)
Juan David presents today for phlebotomy per MD orders. Phlebotomy procedure started at 1437 and ended at 1447. An 18G PIV with secondary kit was used to the R AC. 520 grams removed. Patient was provided beverage prior to the start of the procedure.  IV needle removed intact. At the completion of the procedure, Pt c/o feeling lightheaded and generally not feeling well. This RN raised the Juan legs and provided him a cold wash cloth for his forehead. At 1449 Pt c/o feeling SOB, blurry vision and generally felt worse. Upon assessment Pt was clammy and diaphoretic. VS obtained--BP 38/29, HR 50, O2 100%. 2L O2 started for Pt comfort and Juan Harman, PA-C in [facility] was notified. Juan Harman, PA-C assessed Pt chairside. This RN established new IV access and administered 1 L of NS to gravity. Pt c/o abdominal cramping and had episodes of loose stools. EKG was ordered by Juan Harman, PA-C and was obtained. BP responded to IVFs and Pt returned to baseline. Juan David was notified of the incident and assessed Pt chairside. This RN assisted Pt and Juan David in cleaning up. Pt was made aware of changes to future treatments by Juan David and was agreeable. This RN educated Pt on strict ED precautions if symptoms worsen. Pt and family verbalized understanding of education.  VSS at discharge and Pt was assisted via w/c by NT to car.  Pt remained responsive and alert throughout incident.  See VS and MAR flowsheets for respective information obtained during incident.

## 2022-03-28 NOTE — Progress Notes (Signed)
Called to infusion center to evaluate patient for hypotension. Patient had phlebotomy with 520g removed over 10 minutes. No medications administered today. RN reports upon removing the IV patient complained of feeling unwell. He was diaphoretic and complaining of blurry vision. Upon my arrival to chairside he is hypotension 51/36. IV inserted and 1 L NS running wide open. Patient experienced abdominal cramping followed by episode of loose stool. BP responded to IVF. Patient returned to baseline. He was assisted in cleaning up and assessed by Dr. Burr Medico. She will make adjustments to future treatments. Patient stable for discharge home with spouse.   Strict ED precautions discussed should symptoms worsen.

## 2022-03-28 NOTE — Patient Instructions (Addendum)

## 2022-03-29 ENCOUNTER — Telehealth: Payer: Self-pay

## 2022-03-29 LAB — FERRITIN: Ferritin: 1441 ng/mL — ABNORMAL HIGH (ref 24–336)

## 2022-03-29 NOTE — Telephone Encounter (Signed)
Pt's spouse Manuela Schwartz True LVM stating that they had plans to go on vacation and after the incident that happened in the hospital.  Manuela Schwartz wanted to know if it was still OK for the pt to travel.  Spoke with Dr. Burr Medico regarding pt traveling.  Dr. Burr Medico stated the pt should be OK to travel.  Notified pt and instructed pt to continue to hydrate and to contact Dr. Ernestina Penna office should anything changes.  Pt verbalized understanding.

## 2022-03-30 ENCOUNTER — Inpatient Hospital Stay: Payer: Commercial Managed Care - PPO

## 2022-03-30 ENCOUNTER — Telehealth: Payer: Self-pay

## 2022-03-30 ENCOUNTER — Inpatient Hospital Stay (HOSPITAL_BASED_OUTPATIENT_CLINIC_OR_DEPARTMENT_OTHER): Payer: Commercial Managed Care - PPO | Admitting: Hematology

## 2022-03-30 ENCOUNTER — Other Ambulatory Visit (HOSPITAL_COMMUNITY): Payer: Self-pay

## 2022-03-30 ENCOUNTER — Encounter: Payer: Self-pay | Admitting: Hematology

## 2022-03-30 ENCOUNTER — Other Ambulatory Visit: Payer: Self-pay

## 2022-03-30 VITALS — BP 173/78 | HR 70 | Temp 98.8°F | Resp 18 | Ht 71.0 in | Wt 129.1 lb

## 2022-03-30 DIAGNOSIS — R7989 Other specified abnormal findings of blood chemistry: Secondary | ICD-10-CM

## 2022-03-30 DIAGNOSIS — R197 Diarrhea, unspecified: Secondary | ICD-10-CM | POA: Diagnosis not present

## 2022-03-30 DIAGNOSIS — Z85048 Personal history of other malignant neoplasm of rectum, rectosigmoid junction, and anus: Secondary | ICD-10-CM | POA: Diagnosis not present

## 2022-03-30 LAB — CBC WITH DIFFERENTIAL (CANCER CENTER ONLY)
Abs Immature Granulocytes: 0.01 10*3/uL (ref 0.00–0.07)
Basophils Absolute: 0 10*3/uL (ref 0.0–0.1)
Basophils Relative: 0 %
Eosinophils Absolute: 0.1 10*3/uL (ref 0.0–0.5)
Eosinophils Relative: 2 %
HCT: 33.8 % — ABNORMAL LOW (ref 39.0–52.0)
Hemoglobin: 11.3 g/dL — ABNORMAL LOW (ref 13.0–17.0)
Immature Granulocytes: 0 %
Lymphocytes Relative: 23 %
Lymphs Abs: 1.3 10*3/uL (ref 0.7–4.0)
MCH: 28.3 pg (ref 26.0–34.0)
MCHC: 33.4 g/dL (ref 30.0–36.0)
MCV: 84.5 fL (ref 80.0–100.0)
Monocytes Absolute: 0.6 10*3/uL (ref 0.1–1.0)
Monocytes Relative: 11 %
Neutro Abs: 3.7 10*3/uL (ref 1.7–7.7)
Neutrophils Relative %: 64 %
Platelet Count: 200 10*3/uL (ref 150–400)
RBC: 4 MIL/uL — ABNORMAL LOW (ref 4.22–5.81)
RDW: 14.6 % (ref 11.5–15.5)
WBC Count: 5.7 10*3/uL (ref 4.0–10.5)
nRBC: 0 % (ref 0.0–0.2)

## 2022-03-30 LAB — CMP (CANCER CENTER ONLY)
ALT: 28 U/L (ref 0–44)
AST: 39 U/L (ref 15–41)
Albumin: 3.6 g/dL (ref 3.5–5.0)
Alkaline Phosphatase: 86 U/L (ref 38–126)
Anion gap: 8 (ref 5–15)
BUN: 15 mg/dL (ref 8–23)
CO2: 25 mmol/L (ref 22–32)
Calcium: 8.8 mg/dL — ABNORMAL LOW (ref 8.9–10.3)
Chloride: 105 mmol/L (ref 98–111)
Creatinine: 1.09 mg/dL (ref 0.61–1.24)
GFR, Estimated: >60 mL/min (ref >60)
Glucose, Bld: 89 mg/dL (ref 70–99)
Potassium: 3.6 mmol/L (ref 3.5–5.1)
Sodium: 138 mmol/L (ref 135–145)
Total Bilirubin: 0.4 mg/dL (ref 0.3–1.2)
Total Protein: 7.4 g/dL (ref 6.5–8.1)

## 2022-03-30 MED ORDER — ONDANSETRON HCL 8 MG PO TABS
8.0000 mg | ORAL_TABLET | Freq: Three times a day (TID) | ORAL | 0 refills | Status: DC | PRN
  Filled 2022-03-30: qty 10, 4d supply, fill #0

## 2022-03-30 MED ORDER — SODIUM CHLORIDE 0.9 % IV SOLN: INTRAVENOUS | Status: AC

## 2022-03-30 NOTE — Patient Instructions (Signed)

## 2022-03-30 NOTE — Progress Notes (Signed)
[place]   Telephone:(336) [phone] Fax:(336) [phone]   Clinic Follow up Note   Patient Care Team: Leonard Downing, MD as PCP - General (Family Medicine) Leighton Ruff, MD as Consulting Physician (General Surgery) Kyung Rudd, MD as Consulting Physician (Radiation Oncology) Truitt Merle, MD as Consulting Physician (Hematology) Gatha Mayer, MD as Consulting Physician (Gastroenterology) Dwan Bolt, MD as Consulting Physician (General Surgery)  Date of Service:  03/30/2022  CHIEF COMPLAINT: f/u of  rectal cancer and recent surgery        CURRENT THERAPY: Surveillance    ASSESSMENT:  Juan David is a 67 y.o. male with   Diarrhea  -started yesterday  -No fever, abdominal pain, hematochezia etc. -Possible viral enteritis, or food poisoning. -CBC today showed a normal WBC, mild anemia after his phlebotomy. -Will give normal saline 1 L over 1hr in office today -Will check a history of to rule out pathogen, especially C. Difficile -We discussed using over-the-counter Pepcid and Imodium  Iron overload -He is on phlebotomy, first was done 2 days ago, and he had a severe vasovagal after the phlebotomy -I have changed his phlebotomy to half unit with IVF for future phlebotomy    PLAN: -lab reviewed -Recommend taking imodium and Pepcid as needed - Recommend IV hydration today - I prescribed Zofran -He will return next time for phlebotomy as scheduled   SUMMARY OF ONCOLOGIC HISTORY: Oncology History Overview Note  Cancer Staging Rectal adenocarcinoma [facility]) Staging form: Colon and Rectum, AJCC 8th Edition - Pathologic stage from 09/01/2016: Stage I (pT2, pN0, cM0) - Signed by Truitt Merle, MD on 10/01/2016     Adenocarcinoma of rectum ([place])  09/01/2016 Initial Diagnosis   Rectal adenocarcinoma ([place])   09/01/2016 Surgery   Hemorrhoidectomy 09/01/2016. Noted to have a mobile right lateral anal canal mass. Dr. Leighton Ruff.    09/01/2016  Pathology Results   Invasive adenocarcinoma, well-differentiated, spanning 1.3 cm. The tumor invaded into the anal sphincter muscle. Resection margins were negative. The pathologist notes the tumor would be best staged as pT2 given involvement of muscle.    09/06/2016 Imaging   CT scans chest/abdomen/pelvis 09/06/2016 showed no evidence of metastatic disease.    09/22/2016 Imaging   Pelvic MRI 09/22/2016 showed no definite residual anal tumor. There was no evidence of tumor involvement of the external sphincter or adjacent organs. There was no pelvic lymphadenopathy.   10/10/2016 - 11/22/2016 Radiation Therapy   Patient begun adjuvant radiation with Dr. Lisbeth Renshaw   10/10/2016 - 11/22/2016 Chemotherapy   xeloda '1500mg'$ , twice daily.   11/24/2017 Imaging   CT CAP W contrast 11/24/17  IMPRESSION: 1. Stable exam. No new or progressive interval findings to suggest metastatic disease. 2.  Aortic Atherosclerois (ICD10-170.0) 3.  Emphysema. ([id number])       INTERVAL HISTORY:  Juan David is here for a follow up of  rectal cancer and recent surgery     He was last seen by me on 03/10/2022 He presents to the clinic accompanied by wife. Pt states the diarrhea started after he got home last night. Pt states when he had to pass gas it was watery stool. He felt nausea but has no vomit. He is very lethargic. He has been drinking water but has not had anything to eat today. Pt stated he has drink no more three bottles of water. Pt denies fever, blood in stools. Pt stated he has taken Imodium and it has slowed down. Pt denies cramping in abdomen.  All other systems were reviewed with the patient and are negative.  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   Hypertension    Iron deficiency    Legally blind in left eye, as defined in [place]    per pt only see light   PFO (patent foramen ovale)    per TEE done 05-11-2009    Rectal adenocarcinoma ([place]) 09/28/2016   Traumatic glaucoma, 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 at [facility] in [place]   08-18-2001   Wears glasses     SURGICAL HISTORY: Past Surgical History:  Procedure Laterality Date   ARTHROSCOPIC REPAIR ACL Right 1998   BIOPSY  03/16/2021   Procedure: BIOPSY;  Surgeon: Gatha Mayer, MD;  [facility];  Service: Endoscopy;;   ESOPHAGOGASTRODUODENOSCOPY (EGD) WITH PROPOFOL N/A 03/16/2021   Procedure: ESOPHAGOGASTRODUODENOSCOPY (EGD) WITH PROPOFOL;  Surgeon: Gatha Mayer, MD;  [facility];  Service: Endoscopy;  Laterality: N/A;   EXPLORATION AND REPAIR LEFT EYE INJURY  08-18-2001   [place]   ruptured globe- repair corneal laceration, reposition of prolapsed uveal tissue   HEMORRHOID SURGERY N/A 09/01/2016   Procedure: HEMORRHOIDECTOMY;  Surgeon: Leighton Ruff, MD;  Location: [facility];  Service: General;  Laterality: N/A;   LAPAROTOMY N/A 12/04/2020   Procedure: EXPLORATORY LAPAROTOMY small bowel resection;  Surgeon: Dwan Bolt, MD;  Location: [facility];  Service: General;  Laterality: N/A;   SUPERFICIAL KERATECTOMY Left 06-29-2011    [facility]    with EDTA scrub of left eye   TEE WITHOUT CARDIOVERSION  05-11-2009  dr Dorris Carnes   LVSEF 55-65%/  mild thickened AV without AI/  trace MR/ mixed fixed artherosclerosis plaqueing thoracic aorta/  no evidence thrombus/  by agitated saling and color doppler there was a PFO    I have reviewed the social history and family history with the patient and they are unchanged from previous note.  ALLERGIES:  has No Known Allergies.  MEDICATIONS:  Current Outpatient  Medications  Medication Sig Dispense Refill   ondansetron (ZOFRAN) 8 MG tablet Take 1 tablet (8 mg total) by mouth every 8 (eight) hours as needed for nausea or vomiting. 10 tablet 0   acetaminophen (TYLENOL) 500 MG tablet Take 2 tablets (1,000 mg total) by mouth every 6 (six) hours as needed. 30 tablet 0   hydrochlorothiazide (HYDRODIURIL) 50 MG tablet Take 1/2 tablet (25 mg total) by mouth daily. 90 tablet 0   Multiple Vitamin (MULTIVITAMIN WITH MINERALS) TABS tablet Take 1 tablet by mouth in the morning.     polyethylene glycol (MIRALAX / GLYCOLAX) 17 g packet Take 17 g by mouth in the morning.     timolol (TIMOPTIC) 0.5 % ophthalmic solution Place 1 drop into the left eye every morning.   11   timolol (TIMOPTIC) 0.5 % ophthalmic solution Place 1 drop into the left eye daily. 10 mL 11   Current Facility-Administered Medications  Medication Dose Route Frequency Provider Last Rate Last Admin   0.9 %  sodium chloride infusion   Intravenous Continuous Truitt Merle, MD        PHYSICAL EXAMINATION: ECOG PERFORMANCE STATUS: 2 - Symptomatic, <50% confined to bed  Vitals:   03/30/22 1529  BP: (!) 173/78  Pulse: 70  Resp: 18  Temp: 98.8 F (37.1 C)  SpO2: 100%   Wt Readings from Last 3 Encounters:  03/30/22 129 lb 1.6 oz (58.6 kg)  02/18/22 131 lb 11.2 oz (59.7 kg)  04/08/21 124 lb 8 oz (56.5 kg)      GENERAL:alert, no distress and comfortable SKIN: skin color normal, no rashes or significant lesions EYES: normal, Conjunctiva are pink and non-injected, sclera clear  NEURO: alert & oriented x 3 with fluent speech ABDOMEN:(-) abdomen soft, non-tender and normal bowel sounds Bowels pretty active  LABORATORY DATA:  I have reviewed the data as listed    Latest Ref Rng & Units 03/30/2022    3:00 PM 03/28/2022    1:47 PM 03/16/2021   11:32 AM  CBC  WBC 4.0 - 10.5 K/uL 5.7  6.0  5.7   Hemoglobin 13.0 - 17.0 g/dL 11.3  13.1  12.6   Hematocrit 39.0 - 52.0 % 33.8  39.0  39.3   Platelets  150 - 400 K/uL 200  192  258         Latest Ref Rng & Units 03/30/2022    3:00 PM 03/28/2022    1:47 PM 03/16/2021   11:32 AM  CMP  Glucose 70 - 99 mg/dL 89  74  82   BUN 8 - 23 mg/dL '15  14  19   '$ Creatinine 0.61 - 1.24 mg/dL 1.09  1.00  1.01   Sodium 135 - 145 mmol/L 138  138  134   Potassium 3.5 - 5.1 mmol/L 3.6  3.2  4.6   Chloride 98 - 111 mmol/L 105  106  99   CO2 22 - 32 mmol/L '25  26  22   '$ Calcium 8.9 - 10.3 mg/dL 8.8  9.4  9.8   Total Protein 6.5 - 8.1 g/dL 7.4  6.6  8.1   Total Bilirubin 0.3 - 1.2 mg/dL 0.4  0.5  0.8   Alkaline Phos 38 - 126 U/L 86  98  82   AST 15 - 41 U/L 39  17  23   ALT 0 - 44 U/L '28  12  24       '$ RADIOGRAPHIC STUDIES: I have personally reviewed the radiological images as listed and agreed with the findings in the report. No results found.    No orders of the defined types were placed in this encounter.  All questions were answered. The patient knows to call the clinic with any problems, questions or concerns. No barriers to learning was detected. The total time spent in the appointment was 20 minutes.     Truitt Merle, MD 03/30/2022   Felicity Coyer, CMA, am acting as scribe for Truitt Merle, MD.   I have reviewed the above documentation for accuracy and completeness, and I agree with the above.

## 2022-03-30 NOTE — Telephone Encounter (Signed)
Spoke with pt's spouse today regarding pt having diarrhea and nausea.  Manuela Schwartz said the pt had 6 episodes of diarrhea between the hours of 2 am to 6 am.  Pt is taking Imodium but diarrhea has not resolved.  Pt is drinking 5 to 6 bottles of 16oz water.  Pt c/o nausea and would like antiemetic prescription.  Instructed pt and spouse to have pt drink Liquid IV to rehydrate and replenish the electrolytes being loss in diarrhea.  Also instructed pt to start taking Pepcid AC and ginger for his nausea.  Instructed pt to also take a home Covid test to r/o Covid.  Pt's spouse stated she would go to pharmacy to pick-up all the medications this RN recommended.  Informed pt and spouse Manuela Schwartz that this RN will speak with Dr. Burr Medico regarding the antiemetics.  Manuela Schwartz said their PCP who they contacted before calling Dr. Burr Medico instructed them to contact Dr. Ernestina Penna office to see if Dr. Burr Medico will be redrawing pt's labs and replacing pt's electrolytes if needed.  Dr. Burr Medico asked if this RN check with [facility] to see if pt could be seen.  [facility] provider reviewed pt's chart and c/o given by this RN that were stated by pt.  [facility] provider recommended that pt needs to go to ED d/t pt's symptoms are not related to pt's therapeutic phlebotomy.  Notified Dr. Burr Medico of [facility] recommendation.  Dr. Burr Medico requested to have pt come in for further evaluation and labs.  Notified pt and spouse to confirm appt with Dr. Burr Medico.

## 2022-03-30 NOTE — Telephone Encounter (Signed)
Patients wife called in this morning stating he has had diarrhea and nausea since he had his treatment Monday, he is going every hour to the bathroom as per his wife. She is giving him imodium it has slowed it but still going a lot. She wanted to know if you could call in some ondansetron and something for the diarrhea.

## 2022-03-31 LAB — FERRITIN: Ferritin: 1879 ng/mL — ABNORMAL HIGH (ref 24–336)

## 2022-04-04 LAB — HEMOCHROMATOSIS DNA-PCR(C282Y,H63D)

## 2022-04-07 ENCOUNTER — Other Ambulatory Visit (HOSPITAL_COMMUNITY): Payer: Self-pay

## 2022-04-22 ENCOUNTER — Other Ambulatory Visit: Payer: Self-pay

## 2022-04-22 ENCOUNTER — Inpatient Hospital Stay: Payer: Commercial Managed Care - PPO | Attending: Hematology

## 2022-04-22 DIAGNOSIS — R790 Abnormal level of blood mineral: Secondary | ICD-10-CM | POA: Insufficient documentation

## 2022-04-22 DIAGNOSIS — Z85048 Personal history of other malignant neoplasm of rectum, rectosigmoid junction, and anus: Secondary | ICD-10-CM | POA: Insufficient documentation

## 2022-04-22 LAB — CBC WITH DIFFERENTIAL/PLATELET
Abs Immature Granulocytes: 0.01 10*3/uL (ref 0.00–0.07)
Basophils Absolute: 0 10*3/uL (ref 0.0–0.1)
Basophils Relative: 0 %
Eosinophils Absolute: 0.2 10*3/uL (ref 0.0–0.5)
Eosinophils Relative: 4 %
HCT: 33.8 % — ABNORMAL LOW (ref 39.0–52.0)
Hemoglobin: 11.2 g/dL — ABNORMAL LOW (ref 13.0–17.0)
Immature Granulocytes: 0 %
Lymphocytes Relative: 29 %
Lymphs Abs: 1.6 10*3/uL (ref 0.7–4.0)
MCH: 28.2 pg (ref 26.0–34.0)
MCHC: 33.1 g/dL (ref 30.0–36.0)
MCV: 85.1 fL (ref 80.0–100.0)
Monocytes Absolute: 0.6 10*3/uL (ref 0.1–1.0)
Monocytes Relative: 12 %
Neutro Abs: 2.9 10*3/uL (ref 1.7–7.7)
Neutrophils Relative %: 55 %
Platelets: 251 10*3/uL (ref 150–400)
RBC: 3.97 MIL/uL — ABNORMAL LOW (ref 4.22–5.81)
RDW: 15.4 % (ref 11.5–15.5)
WBC: 5.3 10*3/uL (ref 4.0–10.5)
nRBC: 0 % (ref 0.0–0.2)

## 2022-04-22 LAB — FERRITIN: Ferritin: 1651 ng/mL — ABNORMAL HIGH (ref 24–336)

## 2022-04-24 ENCOUNTER — Encounter: Payer: Self-pay | Admitting: Hematology

## 2022-04-24 ENCOUNTER — Other Ambulatory Visit: Payer: Self-pay | Admitting: Hematology

## 2022-04-25 ENCOUNTER — Inpatient Hospital Stay: Payer: Commercial Managed Care - PPO

## 2022-04-25 ENCOUNTER — Other Ambulatory Visit: Payer: Self-pay

## 2022-04-25 ENCOUNTER — Other Ambulatory Visit: Payer: Self-pay | Admitting: Hematology

## 2022-04-25 ENCOUNTER — Other Ambulatory Visit: Payer: Commercial Managed Care - PPO

## 2022-04-25 DIAGNOSIS — R790 Abnormal level of blood mineral: Secondary | ICD-10-CM | POA: Diagnosis not present

## 2022-04-25 DIAGNOSIS — Z85048 Personal history of other malignant neoplasm of rectum, rectosigmoid junction, and anus: Secondary | ICD-10-CM | POA: Diagnosis not present

## 2022-04-25 MED ORDER — SODIUM CHLORIDE 0.9 % IV SOLN: Freq: Once | INTRAVENOUS | Status: AC

## 2022-04-25 NOTE — Progress Notes (Signed)
Juan David presents today for phlebotomy per MD orders. Phlebotomy procedure started at 1555 and ended at 1622. 250 grams removed using a 20g secondary phlebotomy set. Patient was provided 500cc IVF per Dr. Burr Medico orders. Nutrition offered. Patient observed for 30 minutes after procedure without any incident. Patient tolerated procedure well. IV needle removed intact.

## 2022-04-25 NOTE — Progress Notes (Signed)
Patient is feeling well today and has decided to proceed with phlebotomy. Ok per Dr. Burr Medico. Patient will only have 250g removed and 561m IVF following procedure. Patient and Dr. FBurr Medicoagree with plan. Next appointment will be postponed for phlebotomy. Patient will come for labs and visit.

## 2022-04-25 NOTE — Patient Instructions (Signed)

## 2022-04-26 DIAGNOSIS — H18422 Band keratopathy, left eye: Secondary | ICD-10-CM | POA: Diagnosis not present

## 2022-04-26 DIAGNOSIS — S0532XS Ocular laceration without prolapse or loss of intraocular tissue, left eye, sequela: Secondary | ICD-10-CM | POA: Diagnosis not present

## 2022-04-26 DIAGNOSIS — H4032X4 Glaucoma secondary to eye trauma, left eye, indeterminate stage: Secondary | ICD-10-CM | POA: Diagnosis not present

## 2022-05-24 ENCOUNTER — Other Ambulatory Visit (HOSPITAL_COMMUNITY): Payer: Self-pay

## 2022-05-24 ENCOUNTER — Encounter: Payer: Self-pay | Admitting: Hematology

## 2022-05-24 DIAGNOSIS — S0532XD Ocular laceration without prolapse or loss of intraocular tissue, left eye, subsequent encounter: Secondary | ICD-10-CM | POA: Diagnosis not present

## 2022-05-24 DIAGNOSIS — H4032X4 Glaucoma secondary to eye trauma, left eye, indeterminate stage: Secondary | ICD-10-CM | POA: Diagnosis not present

## 2022-05-24 DIAGNOSIS — H18422 Band keratopathy, left eye: Secondary | ICD-10-CM | POA: Diagnosis not present

## 2022-05-24 MED ORDER — PREDNISOLONE ACETATE 1 % OP SUSP
OPHTHALMIC | 0 refills | Status: AC
  Filled 2022-05-24: qty 10, 28d supply, fill #0

## 2022-05-24 MED ORDER — MOXIFLOXACIN HCL 0.5 % OP SOLN
1.0000 [drp] | Freq: Four times a day (QID) | OPHTHALMIC | 5 refills | Status: DC
  Filled 2022-05-24: qty 3, 15d supply, fill #0
  Filled 2022-07-16: qty 3, 15d supply, fill #1

## 2022-05-25 ENCOUNTER — Other Ambulatory Visit (HOSPITAL_COMMUNITY): Payer: Self-pay

## 2022-05-25 ENCOUNTER — Encounter: Payer: Self-pay | Admitting: Hematology

## 2022-05-27 ENCOUNTER — Inpatient Hospital Stay (HOSPITAL_BASED_OUTPATIENT_CLINIC_OR_DEPARTMENT_OTHER): Payer: Commercial Managed Care - PPO | Admitting: Hematology

## 2022-05-27 ENCOUNTER — Telehealth: Payer: Self-pay | Admitting: Hematology

## 2022-05-27 ENCOUNTER — Inpatient Hospital Stay: Payer: Commercial Managed Care - PPO | Attending: Hematology

## 2022-05-27 ENCOUNTER — Other Ambulatory Visit (HOSPITAL_COMMUNITY): Payer: Self-pay

## 2022-05-27 ENCOUNTER — Encounter: Payer: Self-pay | Admitting: Hematology

## 2022-05-27 ENCOUNTER — Other Ambulatory Visit: Payer: Self-pay

## 2022-05-27 VITALS — BP 132/87 | HR 85 | Temp 97.7°F | Resp 16 | Ht 71.0 in | Wt 129.3 lb

## 2022-05-27 DIAGNOSIS — Z9049 Acquired absence of other specified parts of digestive tract: Secondary | ICD-10-CM | POA: Diagnosis not present

## 2022-05-27 DIAGNOSIS — C20 Malignant neoplasm of rectum: Secondary | ICD-10-CM | POA: Diagnosis not present

## 2022-05-27 DIAGNOSIS — Z86718 Personal history of other venous thrombosis and embolism: Secondary | ICD-10-CM | POA: Insufficient documentation

## 2022-05-27 DIAGNOSIS — Z9221 Personal history of antineoplastic chemotherapy: Secondary | ICD-10-CM | POA: Insufficient documentation

## 2022-05-27 DIAGNOSIS — Z923 Personal history of irradiation: Secondary | ICD-10-CM | POA: Diagnosis not present

## 2022-05-27 DIAGNOSIS — Z79899 Other long term (current) drug therapy: Secondary | ICD-10-CM | POA: Insufficient documentation

## 2022-05-27 DIAGNOSIS — Z85048 Personal history of other malignant neoplasm of rectum, rectosigmoid junction, and anus: Secondary | ICD-10-CM | POA: Diagnosis not present

## 2022-05-27 DIAGNOSIS — I10 Essential (primary) hypertension: Secondary | ICD-10-CM | POA: Insufficient documentation

## 2022-05-27 DIAGNOSIS — R7989 Other specified abnormal findings of blood chemistry: Secondary | ICD-10-CM

## 2022-05-27 LAB — CBC WITH DIFFERENTIAL/PLATELET
Abs Immature Granulocytes: 0.01 10*3/uL (ref 0.00–0.07)
Basophils Absolute: 0 10*3/uL (ref 0.0–0.1)
Basophils Relative: 0 %
Eosinophils Absolute: 0.1 10*3/uL (ref 0.0–0.5)
Eosinophils Relative: 2 %
HCT: 39.1 % (ref 39.0–52.0)
Hemoglobin: 13 g/dL (ref 13.0–17.0)
Immature Granulocytes: 0 %
Lymphocytes Relative: 24 %
Lymphs Abs: 1.5 10*3/uL (ref 0.7–4.0)
MCH: 28.7 pg (ref 26.0–34.0)
MCHC: 33.2 g/dL (ref 30.0–36.0)
MCV: 86.3 fL (ref 80.0–100.0)
Monocytes Absolute: 0.5 10*3/uL (ref 0.1–1.0)
Monocytes Relative: 8 %
Neutro Abs: 4.1 10*3/uL (ref 1.7–7.7)
Neutrophils Relative %: 66 %
Platelets: 267 10*3/uL (ref 150–400)
RBC: 4.53 MIL/uL (ref 4.22–5.81)
RDW: 14.5 % (ref 11.5–15.5)
WBC: 6.3 10*3/uL (ref 4.0–10.5)
nRBC: 0 % (ref 0.0–0.2)

## 2022-05-27 LAB — FERRITIN: Ferritin: 1740 ng/mL — ABNORMAL HIGH (ref 24–336)

## 2022-05-27 MED ORDER — HYDROCHLOROTHIAZIDE 25 MG PO TABS
25.0000 mg | ORAL_TABLET | Freq: Every day | ORAL | 0 refills | Status: DC
  Filled 2022-05-27: qty 90, 90d supply, fill #0

## 2022-05-27 NOTE — Telephone Encounter (Signed)
Called patient per 3/22 secure chat to change appointment on 3/25 to 3pm. Left voicemail with appointment change and contact details if needing to reschedule.

## 2022-05-27 NOTE — Assessment & Plan Note (Signed)
-  He was referred for high ferritin level, and liver MRI confirmed iron overload --He is on phlebotomy, he had a severe vasovagal after the first phlebotomy -I have changed his phlebotomy to half unit with IVF for future phlebotomy, he tolerated much better  -Will monitor his ferritin level.

## 2022-05-27 NOTE — Assessment & Plan Note (Signed)
-  diagnosed in 08/2016, pT2N0 --resection on 09/01/16 by Dr. Marcello Moores, surgical path reviewed a 1.3 cm adenocarcinoma with invasion into anal sphincter muscle.  -Pelvic MRI 09/22/16 showed no residual tumor and no adenopathy.  -Patient completed adjuvant concurrent chemo and radiation 10/10/16-11/22/16, tolerated well overall. -repeated colonoscopy in 03/2021 showed Stricture in the distal sigmoid colon, scope was not able to pass, not malignant appearing, not biopsied -He is clinically doing well, lab reviewed, exam was unremarkable, there is no clinical concern for recurrence. -He has completed 5 years of surveillance.

## 2022-05-27 NOTE — Progress Notes (Signed)
[place]   Telephone:(336) [phone] Fax:(336) [phone]   Clinic Follow up Note   Patient Care Team: Leonard Downing, MD as PCP - General (Family Medicine) Leighton Ruff, MD as Consulting Physician (General Surgery) Kyung Rudd, MD as Consulting Physician (Radiation Oncology) Truitt Merle, MD as Consulting Physician (Hematology) Gatha Mayer, MD as Consulting Physician (Gastroenterology) Dwan Bolt, MD as Consulting Physician (General Surgery)  Date of Service:  05/27/2022  CHIEF COMPLAINT: f/u of  rectal cancer    CURRENT THERAPY:   Surveillance   ASSESSMENT:  Juan David is a 67 y.o. male with   Adenocarcinoma of rectum ([place]) -diagnosed in 08/2016, pT2N0 --resection on 09/01/16 by Dr. Marcello Moores, surgical path reviewed a 1.3 cm adenocarcinoma with invasion into anal sphincter muscle.  -Pelvic MRI 09/22/16 showed no residual tumor and no adenopathy.  -Patient completed adjuvant concurrent chemo and radiation 10/10/16-11/22/16, tolerated well overall. -repeated colonoscopy in 03/2021 showed Stricture in the distal sigmoid colon, scope was not able to pass, not malignant appearing, not biopsied -He is clinically doing well, lab reviewed, exam was unremarkable, there is no clinical concern for recurrence. -He has completed 5 years of surveillance.   iron overload -He was referred for high ferritin level, and liver MRI confirmed iron overload --He is on phlebotomy, he had a severe vasovagal after the first phlebotomy -I have changed his phlebotomy to half unit with IVF for future phlebotomy, he tolerated much better  -Will monitor his ferritin level.       PLAN: -lab reviewed -Ferritin-pending -Phlebotomy half unit with IVF next week then monthly until ferritin<300 -monitor Ferritin level -lab, f/u in 3 months  SUMMARY OF ONCOLOGIC HISTORY: Oncology History Overview Note  Cancer Staging Rectal adenocarcinoma [facility]) Staging form: Colon and Rectum,  AJCC 8th Edition - Pathologic stage from 09/01/2016: Stage I (pT2, pN0, cM0) - Signed by Truitt Merle, MD on 10/01/2016     Adenocarcinoma of rectum ([place])  09/01/2016 Initial Diagnosis   Rectal adenocarcinoma ([place])   09/01/2016 Surgery   Hemorrhoidectomy 09/01/2016. Noted to have a mobile right lateral anal canal mass. Dr. Leighton Ruff.    09/01/2016 Pathology Results   Invasive adenocarcinoma, well-differentiated, spanning 1.3 cm. The tumor invaded into the anal sphincter muscle. Resection margins were negative. The pathologist notes the tumor would be best staged as pT2 given involvement of muscle.    09/06/2016 Imaging   CT scans chest/abdomen/pelvis 09/06/2016 showed no evidence of metastatic disease.    09/22/2016 Imaging   Pelvic MRI 09/22/2016 showed no definite residual anal tumor. There was no evidence of tumor involvement of the external sphincter or adjacent organs. There was no pelvic lymphadenopathy.   10/10/2016 - 11/22/2016 Radiation Therapy   Patient begun adjuvant radiation with Dr. Lisbeth Renshaw   10/10/2016 - 11/22/2016 Chemotherapy   xeloda 1500mg , twice daily.   11/24/2017 Imaging   CT CAP W contrast 11/24/17  IMPRESSION: 1. Stable exam. No new or progressive interval findings to suggest metastatic disease. 2.  Aortic Atherosclerois (ICD10-170.0) 3.  Emphysema. [id number])       INTERVAL HISTORY:   Juan David is here for a follow up of  rectal cancer   He was last seen by me on 03/30/2022 He presents to the clinic accompanied by wife. Pt state that he did the phlebotomy. He denied having dizziness. Pt state that he had eye surgery on Tuesday.   All other systems were reviewed with the patient and are negative.  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/28/2016   Traumatic glaucoma, 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 at [facility] in [place]   08-18-2001   Wears glasses     SURGICAL HISTORY: Past Surgical History:  Procedure Laterality Date   ARTHROSCOPIC REPAIR ACL Right 1998   BIOPSY  03/16/2021   Procedure: BIOPSY;  Surgeon: Gatha Mayer, MD;  [facility];  Service: Endoscopy;;   ESOPHAGOGASTRODUODENOSCOPY (EGD) WITH PROPOFOL N/A 03/16/2021   Procedure: ESOPHAGOGASTRODUODENOSCOPY (EGD) WITH PROPOFOL;  Surgeon: Gatha Mayer, MD;  [facility];  Service: Endoscopy;  Laterality: N/A;   EXPLORATION AND REPAIR LEFT EYE INJURY  08-18-2001   [place]   ruptured globe- repair corneal laceration, reposition of prolapsed uveal tissue   HEMORRHOID SURGERY N/A 09/01/2016   Procedure: HEMORRHOIDECTOMY;  Surgeon: Leighton Ruff, MD;  Location: [facility];  Service: General;  Laterality: N/A;   LAPAROTOMY N/A 12/04/2020   Procedure: EXPLORATORY LAPAROTOMY small bowel resection;  Surgeon: Dwan Bolt, MD;  Location: [place];  Service: General;  Laterality: N/A;   SUPERFICIAL KERATECTOMY Left 06-29-2011    [facility]    with EDTA scrub of left eye   TEE WITHOUT CARDIOVERSION  05-11-2009  dr Dorris Carnes   LVSEF 55-65%/  mild thickened AV without AI/  trace MR/ mixed fixed artherosclerosis plaqueing thoracic aorta/  no evidence thrombus/  by agitated saling and color doppler there was a PFO    I have reviewed the social history and family history with the patient and they are unchanged from previous note.  ALLERGIES:  has No Known  Allergies.  MEDICATIONS:  Current Outpatient Medications  Medication Sig Dispense Refill   acetaminophen (TYLENOL) 500 MG tablet Take 2 tablets (1,000 mg total) by mouth every 6 (six) hours as needed. 30 tablet 0   hydrochlorothiazide (HYDRODIURIL) 25 MG tablet Take 1 tablet (25 mg total) by mouth daily for hypertension. 90 tablet 0   hydrochlorothiazide (HYDRODIURIL) 50 MG tablet Take 1/2 tablet (25 mg total) by mouth daily. 90 tablet 0   moxifloxacin (VIGAMOX) 0.5 % ophthalmic solution Place 1 drop into the left eye 4 (four) times daily for 10 days. 5 mL 5   Multiple Vitamin (MULTIVITAMIN WITH MINERALS) TABS tablet Take 1 tablet by mouth in the morning.     ondansetron (ZOFRAN) 8 MG tablet Take 1 tablet (8 mg total) by mouth every 8 (eight) hours as needed for nausea or vomiting. 10 tablet 0   polyethylene glycol (MIRALAX / GLYCOLAX) 17 g packet Take 17 g by mouth in the morning.     prednisoLONE acetate (PRED FORTE) 1 % ophthalmic suspension Place 1 drop into the left eye 4 (four) times daily for 7 days, 1 drop  3 (three) times daily for 7 days, 1 drop 2 (two) times daily for 7 days, THEN 1 drop daily for 7 days, then stop. 10 mL 0   timolol (TIMOPTIC) 0.5 % ophthalmic solution Place 1 drop into the left eye every morning.   11   timolol (TIMOPTIC) 0.5 % ophthalmic solution Place 1 drop into the left eye daily. 10 mL 11   No current facility-administered medications for this visit.    PHYSICAL EXAMINATION: ECOG PERFORMANCE STATUS: 0 - Asymptomatic  Vitals:   05/27/22 1501  BP: 132/87  Pulse: 85  Resp: 16  Temp: 97.7 F (36.5 C)  SpO2: 98%   Wt Readings from Last 3 Encounters:  05/27/22 129 lb 4.8 oz (58.7 kg)  04/25/22 130 lb 12.8 oz (59.3 kg)  03/30/22 129 lb 1.6 oz (58.6 kg)     GENERAL:alert, no distress and comfortable SKIN: skin color normal, no rashes or significant lesions EYES: normal, Conjunctiva are pink and non-injected, sclera clear  NEURO: alert & oriented x 3  with fluent speech   LABORATORY DATA:  I have reviewed the data as listed    Latest Ref Rng & Units 05/27/2022   12:07 PM 04/22/2022    3:02 PM 03/30/2022    3:00 PM  CBC  WBC 4.0 - 10.5 K/uL 6.3  5.3  5.7   Hemoglobin 13.0 - 17.0 g/dL 13.0  11.2  11.3   Hematocrit 39.0 - 52.0 % 39.1  33.8  33.8   Platelets 150 - 400 K/uL 267  251  200         Latest Ref Rng & Units 03/30/2022    3:00 PM 03/28/2022    1:47 PM 03/16/2021   11:32 AM  CMP  Glucose 70 - 99 mg/dL 89  74  82   BUN 8 - 23 mg/dL 15  14  19    Creatinine 0.61 - 1.24 mg/dL 1.09  1.00  1.01   Sodium 135 - 145 mmol/L 138  138  134   Potassium 3.5 - 5.1 mmol/L 3.6  3.2  4.6   Chloride 98 - 111 mmol/L 105  106  99   CO2 22 - 32 mmol/L 25  26  22    Calcium 8.9 - 10.3 mg/dL 8.8  9.4  9.8   Total Protein 6.5 - 8.1 g/dL 7.4  6.6  8.1   Total Bilirubin 0.3 - 1.2 mg/dL 0.4  0.5  0.8   Alkaline Phos 38 - 126 U/L 86  98  82   AST 15 - 41 U/L 39  17  23   ALT 0 - 44 U/L 28  12  24        RADIOGRAPHIC STUDIES: I have personally reviewed the radiological images as listed and agreed with the findings in the report. No results found.    Orders Placed This Encounter  Procedures   Iron and Iron Binding Capacity ([facility] only)    Standing Status:   Standing    Number of Occurrences:   5    Standing Expiration Date:   05/27/2023   All questions were answered. The patient knows to call the clinic with any problems, questions or concerns. No barriers to learning was detected. The total time spent in the appointment was 20 minutes.     Truitt Merle, MD 05/27/2022   Felicity Coyer, CMA, am acting as scribe for Truitt Merle, MD.   I have reviewed the above documentation for accuracy and completeness, and I  agree with the above.

## 2022-05-30 ENCOUNTER — Other Ambulatory Visit (HOSPITAL_COMMUNITY): Payer: Self-pay

## 2022-05-30 ENCOUNTER — Inpatient Hospital Stay: Payer: Commercial Managed Care - PPO

## 2022-05-30 ENCOUNTER — Ambulatory Visit: Payer: Commercial Managed Care - PPO | Admitting: Hematology

## 2022-05-30 ENCOUNTER — Other Ambulatory Visit: Payer: Commercial Managed Care - PPO

## 2022-05-30 DIAGNOSIS — Z85048 Personal history of other malignant neoplasm of rectum, rectosigmoid junction, and anus: Secondary | ICD-10-CM | POA: Diagnosis not present

## 2022-05-30 DIAGNOSIS — Z79899 Other long term (current) drug therapy: Secondary | ICD-10-CM | POA: Diagnosis not present

## 2022-05-30 DIAGNOSIS — Z923 Personal history of irradiation: Secondary | ICD-10-CM | POA: Diagnosis not present

## 2022-05-30 DIAGNOSIS — I10 Essential (primary) hypertension: Secondary | ICD-10-CM | POA: Diagnosis not present

## 2022-05-30 DIAGNOSIS — Z9221 Personal history of antineoplastic chemotherapy: Secondary | ICD-10-CM | POA: Diagnosis not present

## 2022-05-30 DIAGNOSIS — Z86718 Personal history of other venous thrombosis and embolism: Secondary | ICD-10-CM | POA: Diagnosis not present

## 2022-05-30 DIAGNOSIS — Z9049 Acquired absence of other specified parts of digestive tract: Secondary | ICD-10-CM | POA: Diagnosis not present

## 2022-05-30 MED ORDER — SODIUM CHLORIDE 0.9 % IV SOLN: Freq: Once | INTRAVENOUS | Status: AC

## 2022-05-30 MED ORDER — ERYTHROMYCIN 5 MG/GM OP OINT
TOPICAL_OINTMENT | OPHTHALMIC | 2 refills | Status: DC
  Filled 2022-05-30: qty 3.5, 10d supply, fill #0
  Filled 2022-07-16: qty 3.5, 10d supply, fill #1

## 2022-05-30 NOTE — Patient Instructions (Signed)

## 2022-05-30 NOTE — Progress Notes (Signed)
Juan David presents today for phlebotomy per MD orders. Phlebotomy procedure started at 1508 and ended at 1514. 250 grams removed. Patient observed for 60 minutes after procedure without any incident. Patient tolerated procedure well. Fluids given per MD order.  IV needle removed intact.

## 2022-06-24 ENCOUNTER — Inpatient Hospital Stay: Payer: Commercial Managed Care - PPO | Attending: Hematology

## 2022-06-24 ENCOUNTER — Inpatient Hospital Stay: Payer: Commercial Managed Care - PPO

## 2022-06-24 DIAGNOSIS — Z85048 Personal history of other malignant neoplasm of rectum, rectosigmoid junction, and anus: Secondary | ICD-10-CM | POA: Insufficient documentation

## 2022-06-27 ENCOUNTER — Telehealth: Payer: Self-pay | Admitting: Hematology

## 2022-06-29 DIAGNOSIS — H18422 Band keratopathy, left eye: Secondary | ICD-10-CM | POA: Diagnosis not present

## 2022-06-29 DIAGNOSIS — H4032X4 Glaucoma secondary to eye trauma, left eye, indeterminate stage: Secondary | ICD-10-CM | POA: Diagnosis not present

## 2022-06-29 DIAGNOSIS — S0532XD Ocular laceration without prolapse or loss of intraocular tissue, left eye, subsequent encounter: Secondary | ICD-10-CM | POA: Diagnosis not present

## 2022-06-29 DIAGNOSIS — S0532XS Ocular laceration without prolapse or loss of intraocular tissue, left eye, sequela: Secondary | ICD-10-CM | POA: Diagnosis not present

## 2022-06-30 ENCOUNTER — Telehealth: Payer: Self-pay | Admitting: Hematology

## 2022-07-01 ENCOUNTER — Inpatient Hospital Stay: Payer: Commercial Managed Care - PPO

## 2022-07-01 ENCOUNTER — Other Ambulatory Visit: Payer: Self-pay | Admitting: Hematology

## 2022-07-01 ENCOUNTER — Other Ambulatory Visit: Payer: Self-pay

## 2022-07-01 DIAGNOSIS — R7989 Other specified abnormal findings of blood chemistry: Secondary | ICD-10-CM

## 2022-07-01 DIAGNOSIS — Z85048 Personal history of other malignant neoplasm of rectum, rectosigmoid junction, and anus: Secondary | ICD-10-CM | POA: Diagnosis not present

## 2022-07-01 LAB — CBC WITH DIFFERENTIAL/PLATELET
Abs Immature Granulocytes: 0.01 10*3/uL (ref 0.00–0.07)
Basophils Absolute: 0 10*3/uL (ref 0.0–0.1)
Basophils Relative: 0 %
Eosinophils Absolute: 0.2 10*3/uL (ref 0.0–0.5)
Eosinophils Relative: 4 %
HCT: 36.1 % — ABNORMAL LOW (ref 39.0–52.0)
Hemoglobin: 12.2 g/dL — ABNORMAL LOW (ref 13.0–17.0)
Immature Granulocytes: 0 %
Lymphocytes Relative: 30 %
Lymphs Abs: 1.7 10*3/uL (ref 0.7–4.0)
MCH: 28.8 pg (ref 26.0–34.0)
MCHC: 33.8 g/dL (ref 30.0–36.0)
MCV: 85.3 fL (ref 80.0–100.0)
Monocytes Absolute: 0.6 10*3/uL (ref 0.1–1.0)
Monocytes Relative: 10 %
Neutro Abs: 3.1 10*3/uL (ref 1.7–7.7)
Neutrophils Relative %: 56 %
Platelets: 248 10*3/uL (ref 150–400)
RBC: 4.23 MIL/uL (ref 4.22–5.81)
RDW: 14.7 % (ref 11.5–15.5)
WBC: 5.6 10*3/uL (ref 4.0–10.5)
nRBC: 0 % (ref 0.0–0.2)

## 2022-07-01 LAB — IRON AND IRON BINDING CAPACITY (CC-WL,HP ONLY)
Iron: 95 ug/dL (ref 45–182)
Saturation Ratios: 35 % (ref 17.9–39.5)
TIBC: 274 ug/dL (ref 250–450)
UIBC: 179 ug/dL (ref 117–376)

## 2022-07-01 LAB — FERRITIN: Ferritin: 1412 ng/mL — ABNORMAL HIGH (ref 24–336)

## 2022-07-01 MED ORDER — SODIUM CHLORIDE 0.9 % IV SOLN: Freq: Once | INTRAVENOUS | Status: AC

## 2022-07-01 NOTE — Progress Notes (Signed)
Juan David presents today for phlebotomy per MD orders. Phlebotomy procedure started at 1528 and ended at 1532. 260 grams removed. Patient observed for 30 minutes after procedure without any incident. Patient tolerated procedure well. 18g IV needle removed intact.

## 2022-07-01 NOTE — Patient Instructions (Signed)

## 2022-07-21 ENCOUNTER — Other Ambulatory Visit (HOSPITAL_COMMUNITY): Payer: Self-pay

## 2022-07-25 ENCOUNTER — Encounter: Payer: Self-pay | Admitting: Hematology

## 2022-07-26 ENCOUNTER — Telehealth: Payer: Self-pay | Admitting: Hematology

## 2022-07-26 NOTE — Telephone Encounter (Signed)
Contacted patient to scheduled appointments. Left message with appointment details and a call back number if patient had any questions or could not accommodate the time we provided.   

## 2022-07-28 ENCOUNTER — Inpatient Hospital Stay: Payer: Commercial Managed Care - PPO | Attending: Hematology

## 2022-07-28 DIAGNOSIS — Z85048 Personal history of other malignant neoplasm of rectum, rectosigmoid junction, and anus: Secondary | ICD-10-CM | POA: Diagnosis not present

## 2022-07-28 LAB — CBC WITH DIFFERENTIAL/PLATELET
Abs Immature Granulocytes: 0.02 10*3/uL (ref 0.00–0.07)
Basophils Absolute: 0 10*3/uL (ref 0.0–0.1)
Basophils Relative: 0 %
Eosinophils Absolute: 0.2 10*3/uL (ref 0.0–0.5)
Eosinophils Relative: 4 %
HCT: 39 % (ref 39.0–52.0)
Hemoglobin: 12.8 g/dL — ABNORMAL LOW (ref 13.0–17.0)
Immature Granulocytes: 0 %
Lymphocytes Relative: 30 %
Lymphs Abs: 1.7 10*3/uL (ref 0.7–4.0)
MCH: 28.3 pg (ref 26.0–34.0)
MCHC: 32.8 g/dL (ref 30.0–36.0)
MCV: 86.3 fL (ref 80.0–100.0)
Monocytes Absolute: 0.5 10*3/uL (ref 0.1–1.0)
Monocytes Relative: 10 %
Neutro Abs: 3.2 10*3/uL (ref 1.7–7.7)
Neutrophils Relative %: 56 %
Platelets: 223 10*3/uL (ref 150–400)
RBC: 4.52 MIL/uL (ref 4.22–5.81)
RDW: 14.6 % (ref 11.5–15.5)
WBC: 5.6 10*3/uL (ref 4.0–10.5)
nRBC: 0 % (ref 0.0–0.2)

## 2022-07-28 LAB — FERRITIN: Ferritin: 1583 ng/mL — ABNORMAL HIGH (ref 24–336)

## 2022-07-29 ENCOUNTER — Inpatient Hospital Stay: Payer: Commercial Managed Care - PPO

## 2022-07-29 ENCOUNTER — Other Ambulatory Visit: Payer: Self-pay

## 2022-07-29 DIAGNOSIS — Z85048 Personal history of other malignant neoplasm of rectum, rectosigmoid junction, and anus: Secondary | ICD-10-CM | POA: Diagnosis not present

## 2022-07-29 MED ORDER — SODIUM CHLORIDE 0.9 % IV SOLN: Freq: Once | INTRAVENOUS | Status: AC

## 2022-07-29 NOTE — Progress Notes (Signed)
Juan David presents today for phlebotomy per MD orders. Phlebotomy procedure started at 1538 and ended at 1543. 270 grams removed using 20g phlebotomy secondary set. IV Fluids post phlebotomy provided as MD ordered.  Patient observed for 30 minutes after procedure without any incident. Patient tolerated procedure well. IV needle removed intact.

## 2022-07-29 NOTE — Patient Instructions (Signed)

## 2022-07-31 ENCOUNTER — Other Ambulatory Visit: Payer: Self-pay | Admitting: Hematology

## 2022-08-19 ENCOUNTER — Telehealth: Payer: Self-pay | Admitting: Hematology

## 2022-08-23 ENCOUNTER — Other Ambulatory Visit: Payer: Self-pay

## 2022-08-23 ENCOUNTER — Inpatient Hospital Stay: Payer: Commercial Managed Care - PPO | Attending: Hematology

## 2022-08-23 DIAGNOSIS — Z9221 Personal history of antineoplastic chemotherapy: Secondary | ICD-10-CM | POA: Diagnosis not present

## 2022-08-23 DIAGNOSIS — Z86718 Personal history of other venous thrombosis and embolism: Secondary | ICD-10-CM | POA: Diagnosis not present

## 2022-08-23 DIAGNOSIS — R7989 Other specified abnormal findings of blood chemistry: Secondary | ICD-10-CM

## 2022-08-23 DIAGNOSIS — Z79899 Other long term (current) drug therapy: Secondary | ICD-10-CM | POA: Diagnosis not present

## 2022-08-23 DIAGNOSIS — Z85048 Personal history of other malignant neoplasm of rectum, rectosigmoid junction, and anus: Secondary | ICD-10-CM | POA: Diagnosis not present

## 2022-08-23 DIAGNOSIS — Z923 Personal history of irradiation: Secondary | ICD-10-CM | POA: Diagnosis not present

## 2022-08-23 LAB — CBC WITH DIFFERENTIAL/PLATELET
Abs Immature Granulocytes: 0.01 10*3/uL (ref 0.00–0.07)
Basophils Absolute: 0 10*3/uL (ref 0.0–0.1)
Basophils Relative: 0 %
Eosinophils Absolute: 0.2 10*3/uL (ref 0.0–0.5)
Eosinophils Relative: 3 %
HCT: 35.4 % — ABNORMAL LOW (ref 39.0–52.0)
Hemoglobin: 12 g/dL — ABNORMAL LOW (ref 13.0–17.0)
Immature Granulocytes: 0 %
Lymphocytes Relative: 28 %
Lymphs Abs: 1.5 10*3/uL (ref 0.7–4.0)
MCH: 29.1 pg (ref 26.0–34.0)
MCHC: 33.9 g/dL (ref 30.0–36.0)
MCV: 85.7 fL (ref 80.0–100.0)
Monocytes Absolute: 0.4 10*3/uL (ref 0.1–1.0)
Monocytes Relative: 8 %
Neutro Abs: 3.3 10*3/uL (ref 1.7–7.7)
Neutrophils Relative %: 61 %
Platelets: 236 10*3/uL (ref 150–400)
RBC: 4.13 MIL/uL — ABNORMAL LOW (ref 4.22–5.81)
RDW: 14.6 % (ref 11.5–15.5)
WBC: 5.5 10*3/uL (ref 4.0–10.5)
nRBC: 0 % (ref 0.0–0.2)

## 2022-08-23 LAB — COMPREHENSIVE METABOLIC PANEL
ALT: 11 U/L (ref 0–44)
AST: 16 U/L (ref 15–41)
Albumin: 4 g/dL (ref 3.5–5.0)
Alkaline Phosphatase: 89 U/L (ref 38–126)
Anion gap: 4 — ABNORMAL LOW (ref 5–15)
BUN: 15 mg/dL (ref 8–23)
CO2: 29 mmol/L (ref 22–32)
Calcium: 9.3 mg/dL (ref 8.9–10.3)
Chloride: 104 mmol/L (ref 98–111)
Creatinine, Ser: 0.94 mg/dL (ref 0.61–1.24)
GFR, Estimated: >60 mL/min (ref >60)
Glucose, Bld: 126 mg/dL — ABNORMAL HIGH (ref 70–99)
Potassium: 3.6 mmol/L (ref 3.5–5.1)
Sodium: 137 mmol/L (ref 135–145)
Total Bilirubin: 0.5 mg/dL (ref 0.3–1.2)
Total Protein: 6.8 g/dL (ref 6.5–8.1)

## 2022-08-23 LAB — IRON AND IRON BINDING CAPACITY (CC-WL,HP ONLY)
Iron: 81 ug/dL (ref 45–182)
Saturation Ratios: 25 % (ref 17.9–39.5)
TIBC: 319 ug/dL (ref 250–450)
UIBC: 238 ug/dL (ref 117–376)

## 2022-08-23 LAB — FERRITIN: Ferritin: 1816 ng/mL — ABNORMAL HIGH (ref 24–336)

## 2022-08-26 ENCOUNTER — Inpatient Hospital Stay: Payer: Commercial Managed Care - PPO

## 2022-08-26 ENCOUNTER — Other Ambulatory Visit: Payer: Self-pay

## 2022-08-26 ENCOUNTER — Inpatient Hospital Stay (HOSPITAL_BASED_OUTPATIENT_CLINIC_OR_DEPARTMENT_OTHER): Payer: Commercial Managed Care - PPO | Admitting: Hematology

## 2022-08-26 VITALS — BP 140/86 | HR 73 | Temp 98.3°F | Resp 18 | Ht 71.0 in | Wt 129.7 lb

## 2022-08-26 DIAGNOSIS — Z9221 Personal history of antineoplastic chemotherapy: Secondary | ICD-10-CM | POA: Diagnosis not present

## 2022-08-26 DIAGNOSIS — C20 Malignant neoplasm of rectum: Secondary | ICD-10-CM | POA: Diagnosis not present

## 2022-08-26 DIAGNOSIS — Z85048 Personal history of other malignant neoplasm of rectum, rectosigmoid junction, and anus: Secondary | ICD-10-CM | POA: Diagnosis not present

## 2022-08-26 DIAGNOSIS — R7989 Other specified abnormal findings of blood chemistry: Secondary | ICD-10-CM | POA: Diagnosis not present

## 2022-08-26 DIAGNOSIS — Z86718 Personal history of other venous thrombosis and embolism: Secondary | ICD-10-CM | POA: Diagnosis not present

## 2022-08-26 DIAGNOSIS — Z79899 Other long term (current) drug therapy: Secondary | ICD-10-CM | POA: Diagnosis not present

## 2022-08-26 DIAGNOSIS — Z923 Personal history of irradiation: Secondary | ICD-10-CM | POA: Diagnosis not present

## 2022-08-26 NOTE — Assessment & Plan Note (Signed)
-  diagnosed in 08/2016, pT2N0 --resection on 09/01/16 by Dr. Thomas, surgical path reviewed a 1.3 cm adenocarcinoma with invasion into anal sphincter muscle.  -Pelvic MRI 09/22/16 showed no residual tumor and no adenopathy.  -Patient completed adjuvant concurrent chemo and radiation 10/10/16-11/22/16, tolerated well overall. -repeated colonoscopy in 03/2021 showed Stricture in the distal sigmoid colon, scope was not able to pass, not malignant appearing, not biopsied -He is clinically doing well, lab reviewed, exam was unremarkable, there is no clinical concern for recurrence. -He has completed 5 years of surveillance.  

## 2022-08-26 NOTE — Assessment & Plan Note (Signed)
He was referred for high ferritin level, and liver MRI confirmed iron overload --He is on phlebotomy, he had a severe vasovagal after the first phlebotomy -I have changed his phlebotomy to half unit with IVF for future phlebotomy, he tolerated much better  -He is serum iron and saturation has been normal, however he has persistent elevated ferritin, did not change much since he started phlebotomy.  He is elevated ferritin is likely related to his chronic inflammation.   

## 2022-08-26 NOTE — Assessment & Plan Note (Deleted)
He was referred for high ferritin level, and liver MRI confirmed iron overload --He is on phlebotomy, he had a severe vasovagal after the first phlebotomy -I have changed his phlebotomy to half unit with IVF for future phlebotomy, he tolerated much better  -He is serum iron and saturation has been normal, however he has persistent elevated ferritin, did not change much since he started phlebotomy.  He is elevated ferritin is likely related to his chronic inflammation.

## 2022-08-26 NOTE — Progress Notes (Unsigned)
[facility]   Telephone:(336) [phone] Fax:([phone]) [phone]   Clinic Follow up Note   Patient Care Team: Malachy Mood, MD as PCP - General (Hematology) Romie Levee, MD as Consulting Physician (General Surgery) Dorothy Puffer, MD as Consulting Physician (Radiation Oncology) Malachy Mood, MD as Consulting Physician (Hematology) Iva Boop, MD as Consulting Physician (Gastroenterology) Fritzi Mandes, MD as Consulting Physician (General Surgery)  Date of Service:  08/26/2022  CHIEF COMPLAINT: f/u of rectal cancer    CURRENT THERAPY:  Surveillance  ASSESSMENT:  Juan David is a 67 y.o. male with   iron overload He was referred for high ferritin level, and liver MRI confirmed iron overload --He is on phlebotomy, he had a severe vasovagal after the first phlebotomy -I have changed his phlebotomy to half unit with IVF for future phlebotomy, he tolerated much better  -He is serum iron and saturation has been normal, however he has persistent elevated ferritin, did not change much since he started phlebotomy.  He is elevated ferritin is likely related to his chronic inflammation.    Adenocarcinoma of rectum (HCC) -diagnosed in 08/2016, pT2N0 --resection on 09/01/16 by Dr. Maisie Fus, surgical path reviewed a 1.3 cm adenocarcinoma with invasion into anal sphincter muscle.  -Pelvic MRI 09/22/16 showed no residual tumor and no adenopathy.  -Patient completed adjuvant concurrent chemo and radiation 10/10/16-11/22/16, tolerated well overall. -repeated colonoscopy in 03/2021 showed Stricture in the distal sigmoid colon, scope was not able to pass, not malignant appearing, not biopsied -He is clinically doing well, lab reviewed, exam was unremarkable, there is no clinical concern for recurrence. -He has completed 5 years of surveillance.     PLAN: -lab reviewed -reviewed Ferritin level -recommend phlebotomy  every 3 months 1/2 unit  -lab and f/u in a few days off     SUMMARY OF  ONCOLOGIC HISTORY: Oncology History Overview Note  Cancer Staging Rectal adenocarcinoma [facility]) Staging form: Colon and Rectum, AJCC 8th Edition - Pathologic stage from 09/01/2016: Stage I (pT2, pN0, cM0) - Signed by Malachy Mood, MD on 10/01/2016     Adenocarcinoma of rectum (HCC)  09/01/2016 Initial Diagnosis   Rectal adenocarcinoma (HCC)   09/01/2016 Surgery   Hemorrhoidectomy 09/01/2016. Noted to have a mobile right lateral anal canal mass. Dr. Romie Levee.    09/01/2016 Pathology Results   Invasive adenocarcinoma, well-differentiated, spanning 1.3 cm. The tumor invaded into the anal sphincter muscle. Resection margins were negative. The pathologist notes the tumor would be best staged as pT2 given involvement of muscle.    09/06/2016 Imaging   CT scans chest/abdomen/pelvis 09/06/2016 showed no evidence of metastatic disease.    09/22/2016 Imaging   Pelvic MRI 09/22/2016 showed no definite residual anal tumor. There was no evidence of tumor involvement of the external sphincter or adjacent organs. There was no pelvic lymphadenopathy.   10/10/2016 - 11/22/2016 Radiation Therapy   Patient begun adjuvant radiation with Dr. Mitzi Hansen   10/10/2016 - 11/22/2016 Chemotherapy   xeloda 1500mg , twice daily.   11/24/2017 Imaging   CT CAP W contrast 11/24/17  IMPRESSION: 1. Stable exam. No new or progressive interval findings to suggest metastatic disease. 2.  Aortic Atherosclerois (ICD10-170.0) 3.  Emphysema. ([id number])       INTERVAL HISTORY:  Juan David is here for a follow up of rectal cancer . He was last seen by me on 05/27/2022. He presents to the clinic accompanied by wife. Pt reports he fell fine. Pt state that he has some fatigue after  giving phlebotomy      All other systems were reviewed with the patient and are negative.  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   Hypertension    Iron deficiency    Legally blind in left eye, as defined in [place]    per pt only see light   PFO (patent foramen ovale)    per TEE done 05-11-2009    Rectal adenocarcinoma (HCC) 09/28/2016   Traumatic glaucoma, 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 at [facility] in [place]   08-18-2001   Wears glasses     SURGICAL HISTORY: Past Surgical History:  Procedure Laterality Date   ARTHROSCOPIC REPAIR ACL Right 1998   BIOPSY  03/16/2021   Procedure: BIOPSY;  Surgeon: Iva Boop, MD;  Location: [facility];  Service: Endoscopy;;   ESOPHAGOGASTRODUODENOSCOPY (EGD) WITH PROPOFOL N/A 03/16/2021   Procedure: ESOPHAGOGASTRODUODENOSCOPY (EGD) WITH PROPOFOL;  Surgeon: Iva Boop, MD;  Location: [facility];  Service: Endoscopy;  Laterality: N/A;   EXPLORATION AND REPAIR LEFT EYE INJURY  08-18-2001   [place]   ruptured globe- repair corneal laceration, reposition of prolapsed uveal tissue   HEMORRHOID SURGERY N/A 09/01/2016   Procedure: HEMORRHOIDECTOMY;  Surgeon: Romie Levee, MD;  Location: [facility];  Service: General;  Laterality: N/A;   LAPAROTOMY N/A 12/04/2020   Procedure: EXPLORATORY LAPAROTOMY small bowel resection;  Surgeon: Fritzi Mandes, MD;  Location: [facility] OR;  Service: General;  Laterality: N/A;   SUPERFICIAL KERATECTOMY Left 06-29-2011    [facility]    with EDTA scrub of left eye   TEE WITHOUT CARDIOVERSION  05-11-2009  dr Dietrich Pates   LVSEF 55-65%/  mild thickened AV without AI/  trace MR/ mixed fixed artherosclerosis plaqueing thoracic aorta/  no evidence thrombus/  by agitated saling and color doppler there was a PFO    I have reviewed the social history and family history  with the patient and they are unchanged from previous note.  ALLERGIES:  has No Known Allergies.  MEDICATIONS:  Current Outpatient Medications  Medication Sig Dispense Refill   acetaminophen (TYLENOL) 500 MG tablet Take 2 tablets (1,000 mg total) by mouth every 6 (six) hours as needed. 30 tablet 0   erythromycin ophthalmic ointment Apply 0.5 inches to left eye at bedtime for 10 days. 3.5 g 2   hydrochlorothiazide (HYDRODIURIL) 25 MG tablet Take 1 tablet (25 mg total) by mouth daily for hypertension. 90 tablet 0   hydrochlorothiazide (HYDRODIURIL) 50 MG tablet Take 1/2 tablet (25 mg total) by mouth daily. 90 tablet 0   moxifloxacin (VIGAMOX) 0.5 % ophthalmic solution Place 1 drop into the left eye 4 (four) times daily for 10 days. 5 mL 5   Multiple Vitamin (MULTIVITAMIN WITH MINERALS) TABS tablet Take 1 tablet by mouth in the morning.     ondansetron (ZOFRAN) 8 MG tablet Take 1 tablet (8 mg total) by mouth every 8 (eight) hours as needed for nausea or vomiting. 10 tablet 0   polyethylene glycol (MIRALAX /  GLYCOLAX) 17 g packet Take 17 g by mouth in the morning.     timolol (TIMOPTIC) 0.5 % ophthalmic solution Place 1 drop into the left eye every morning.   11   timolol (TIMOPTIC) 0.5 % ophthalmic solution Place 1 drop into the left eye daily. 10 mL 11   No current facility-administered medications for this visit.    PHYSICAL EXAMINATION: ECOG PERFORMANCE STATUS: {CHL ONC ECOG [phone]}  There were no vitals filed for this visit. Wt Readings from Last 3 Encounters:  05/27/22 129 lb 4.8 oz (58.7 kg)  04/25/22 130 lb 12.8 oz (59.3 kg)  03/30/22 129 lb 1.6 oz (58.6 kg)     GENERAL:alert, no distress and comfortable SKIN: skin color normal, no rashes or significant lesions EYES: normal, Conjunctiva are pink and non-injected, sclera clear  NEURO: alert & oriented x 3 with fluent speech  LABORATORY DATA:  I have reviewed the data as listed    Latest Ref Rng & Units 08/23/2022    12:26 PM 07/28/2022   10:22 AM 07/01/2022    2:17 PM  CBC  WBC 4.0 - 10.5 K/uL 5.5  5.6  5.6   Hemoglobin 13.0 - 17.0 g/dL 40.9  81.1  91.4   Hematocrit 39.0 - 52.0 % 35.4  39.0  36.1   Platelets 150 - 400 K/uL 236  223  248         Latest Ref Rng & Units 08/23/2022   12:26 PM 03/30/2022    3:00 PM 03/28/2022    1:47 PM  CMP  Glucose 70 - 99 mg/dL 782  89  74   BUN 8 - 23 mg/dL 15  15  14    Creatinine 0.61 - 1.24 mg/dL 9.56  2.13  0.86   Sodium 135 - 145 mmol/L 137  138  138   Potassium 3.5 - 5.1 mmol/L 3.6  3.6  3.2   Chloride 98 - 111 mmol/L 104  105  106   CO2 22 - 32 mmol/L 29  25  26    Calcium 8.9 - 10.3 mg/dL 9.3  8.8  9.4   Total Protein 6.5 - 8.1 g/dL 6.8  7.4  6.6   Total Bilirubin 0.3 - 1.2 mg/dL 0.5  0.4  0.5   Alkaline Phos 38 - 126 U/L 89  86  98   AST 15 - 41 U/L 16  39  17   ALT 0 - 44 U/L 11  28  12        RADIOGRAPHIC STUDIES: I have personally reviewed the radiological images as listed and agreed with the findings in the report. No results found.    No orders of the defined types were placed in this encounter.  All questions were answered. The patient knows to call the clinic with any problems, questions or concerns. No barriers to learning was detected. The total time spent in the appointment was {CHL ONC TIME VISIT - VHQIO:[id number]}.     Salome Holmes, CMA 08/26/2022   I, Monica Martinez, CMA, am acting as scribe for Malachy Mood, MD.   {Add scribe attestation statement}

## 2022-08-30 ENCOUNTER — Encounter: Payer: Self-pay | Admitting: Hematology

## 2022-09-02 ENCOUNTER — Other Ambulatory Visit (HOSPITAL_COMMUNITY): Payer: Self-pay

## 2022-09-02 DIAGNOSIS — H5201 Hypermetropia, right eye: Secondary | ICD-10-CM | POA: Diagnosis not present

## 2022-09-02 DIAGNOSIS — H2511 Age-related nuclear cataract, right eye: Secondary | ICD-10-CM | POA: Diagnosis not present

## 2022-09-02 DIAGNOSIS — H1789 Other corneal scars and opacities: Secondary | ICD-10-CM | POA: Diagnosis not present

## 2022-09-02 DIAGNOSIS — H52221 Regular astigmatism, right eye: Secondary | ICD-10-CM | POA: Diagnosis not present

## 2022-09-02 MED ORDER — HYDROCHLOROTHIAZIDE 25 MG PO TABS
25.0000 mg | ORAL_TABLET | Freq: Every day | ORAL | 0 refills | Status: DC
  Filled 2022-09-02: qty 90, 90d supply, fill #0

## 2022-09-05 ENCOUNTER — Other Ambulatory Visit (HOSPITAL_COMMUNITY): Payer: Self-pay

## 2022-09-09 ENCOUNTER — Other Ambulatory Visit (HOSPITAL_COMMUNITY): Payer: Self-pay

## 2022-10-02 IMAGING — CT CT ABD-PELV W/ CM
2 of 5 series · 15 of 46 positions shown, 17 images · IV contrast (OMNIPAQUE 350)
Comparison: 12/16/2020

CLINICAL DATA: Bowel obstruction suspected, severe right-sided
abdominal pain, nausea, and vomiting, status post recent small-bowel
resection, history of rectal cancer and bowel perforation

EXAM:
CT ABDOMEN AND PELVIS WITH CONTRAST
TECHNIQUE: Multidetector CT imaging of the abdomen and pelvis was performed
using the standard protocol following bolus administration of
intravenous contrast.
CONTRAST:  80mL OMNIPAQUE IOHEXOL 350 MG/ML SOLN

[Series 2: axial st · axial · 0.63mm/px · z∈[+1006,+1396]mm · 12 of 90 slices shown, 14 images]
[im 6/90  soft-tissue]
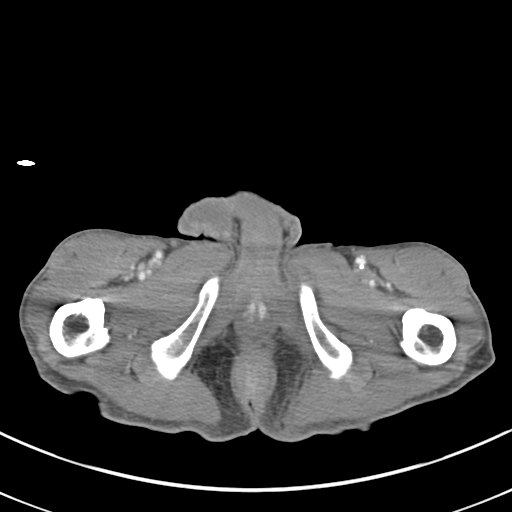
[im 6/90  bone]
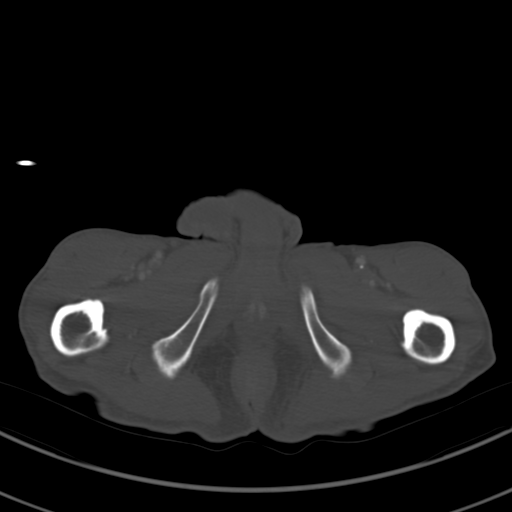
[im 12/90  soft-tissue]
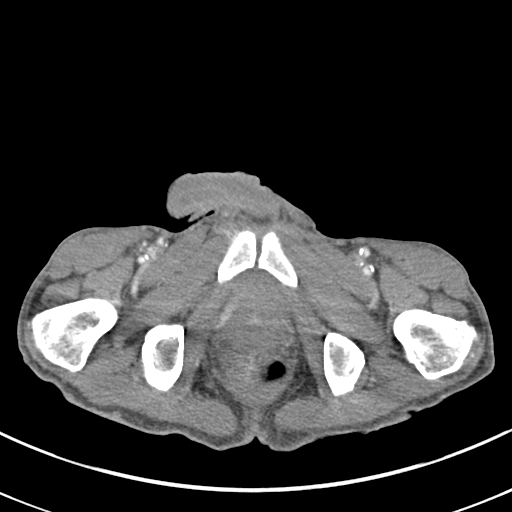
[im 23/90  soft-tissue]
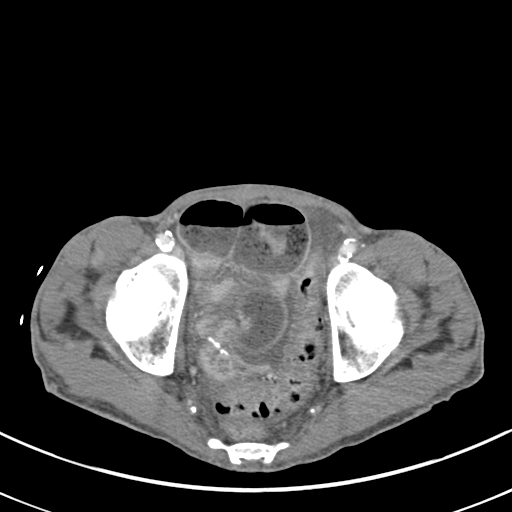
[im 28/90  soft-tissue]
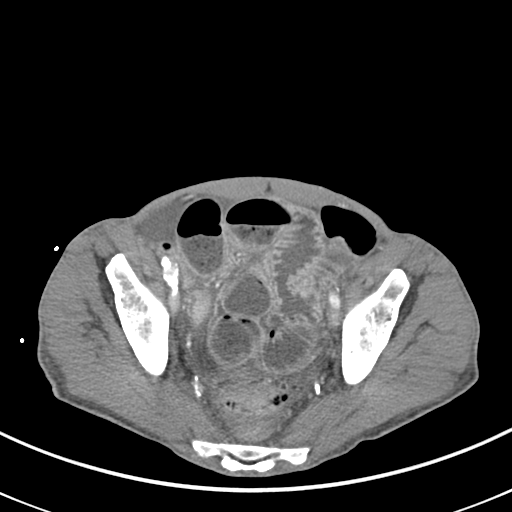
[im 34/90  soft-tissue]
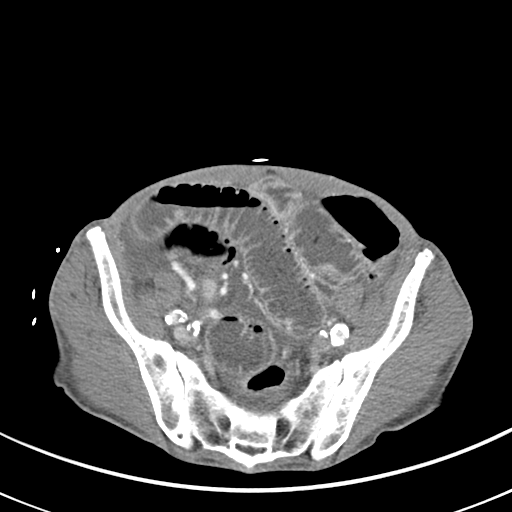
[im 39/90  soft-tissue]
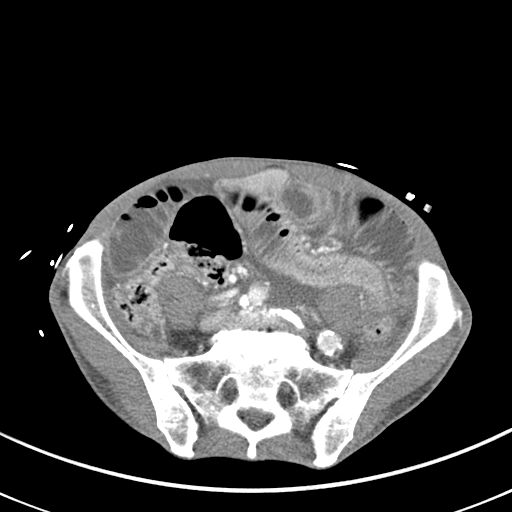
[im 51/90  soft-tissue]
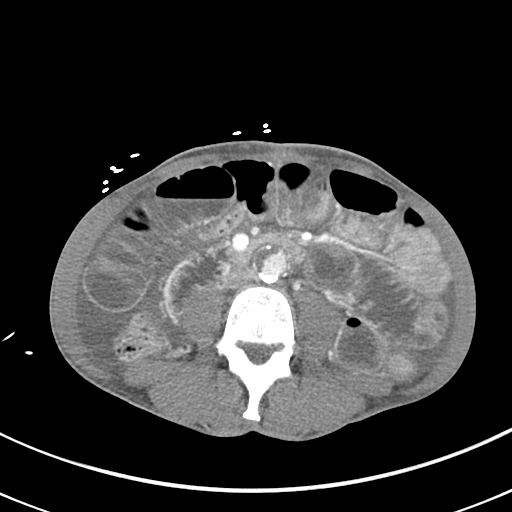
[im 56/90  soft-tissue]
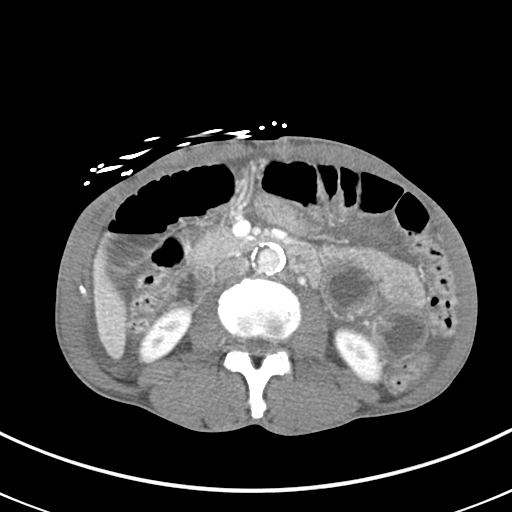
[im 62/90  soft-tissue]
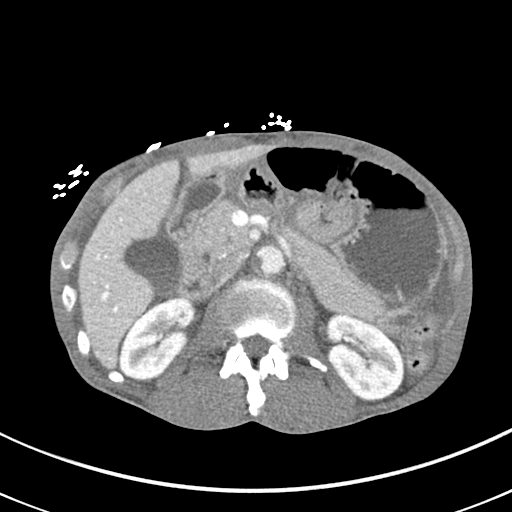
[im 62/90  bone]
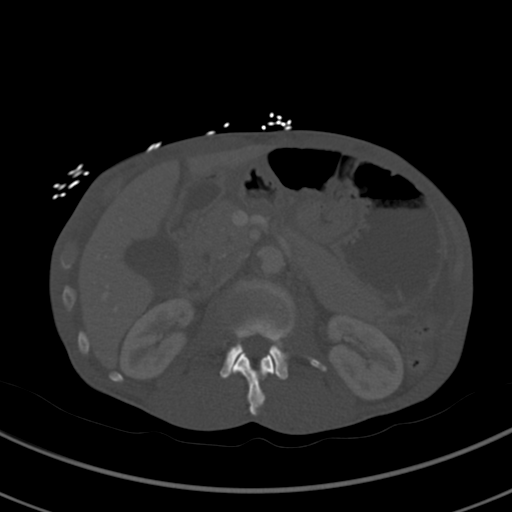
[im 67/90  soft-tissue]
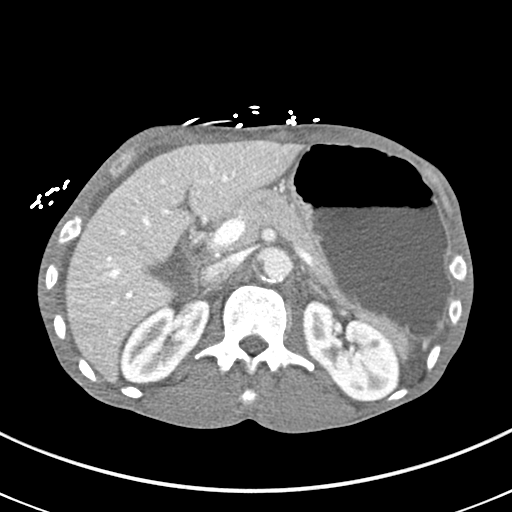
[im 78/90  soft-tissue]
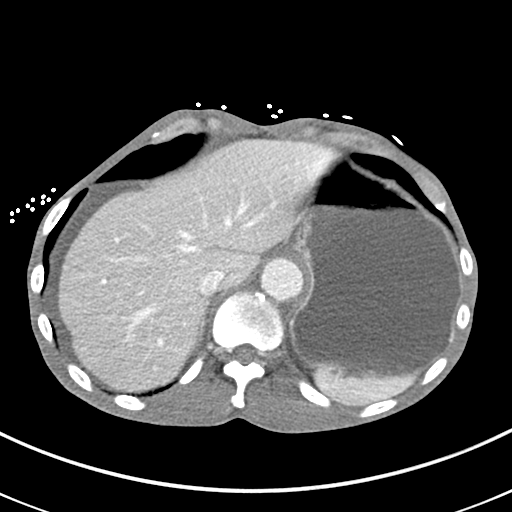
[im 84/90  soft-tissue]
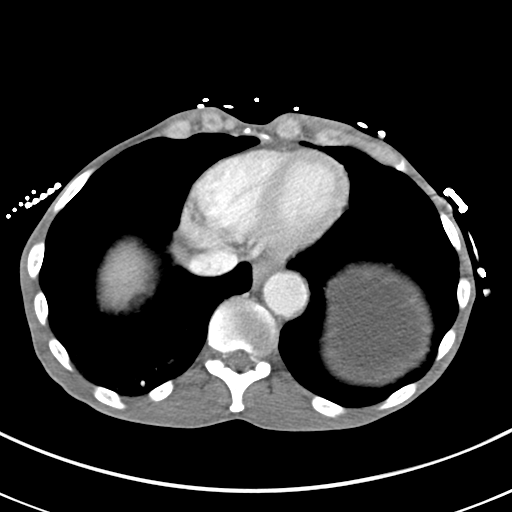

[Series 4: coronal st · coronal · 0.63mm/px · 3 of 126 slices shown]
[im 42/126  soft-tissue]
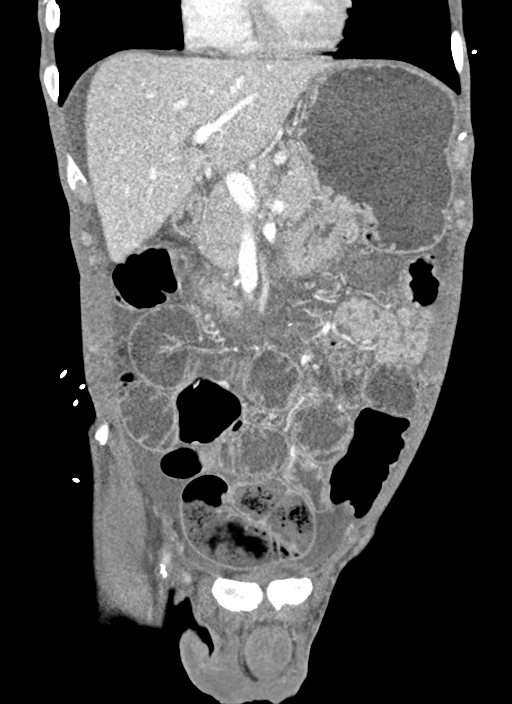
[im 56/126  soft-tissue]
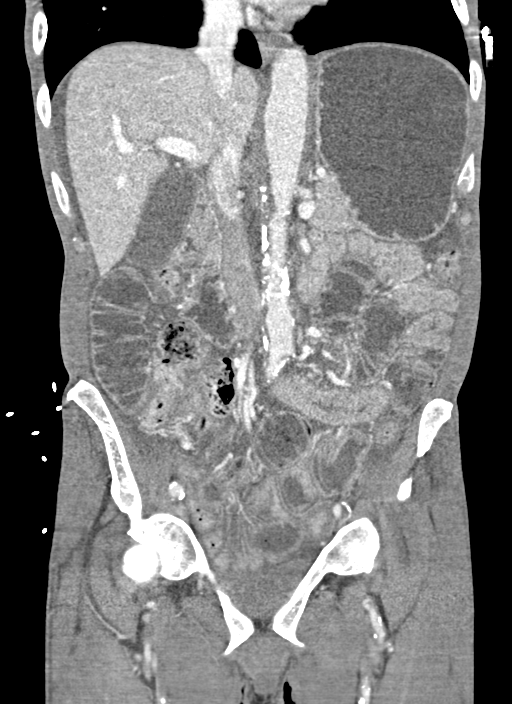
[im 70/126  soft-tissue]
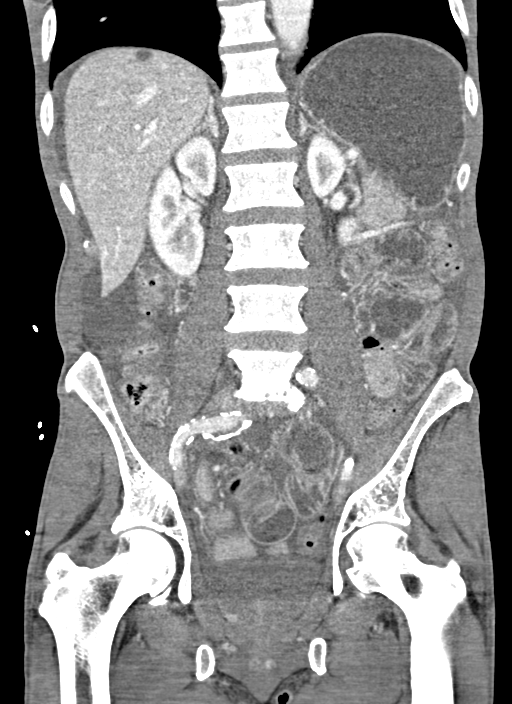

[15 of 46 positions shown; findings below may reference images not displayed]

FINDINGS: Lower chest: No acute abnormality. Unchanged 0.5 cm nodule of the
right lower lobe (series 6, image 7).

Hepatobiliary: No solid liver abnormality is seen. No gallstones,
gallbladder wall thickening, or biliary dilatation.

Pancreas: Unremarkable. No pancreatic ductal dilatation or
surrounding inflammatory changes.

Spleen: Normal in size without significant abnormality.

Adrenals/Urinary Tract: Adrenal glands are unremarkable. Kidneys are
normal, without renal calculi, solid lesion, or hydronephrosis.
Bladder is unremarkable.

Stomach/Bowel: Stomach is within normal limits. The mid to distal
small bowel is diffusely gas and fluid distended, with multiple
fecalized loops in the low abdomen and pelvis. Loops measure up to
3.7 cm in caliber. There is a fecalized loop with an abrupt caliber
change in the low midline abdomen (series 2, image 64, series 4,
image 42) with complete decompression of approximately the distal 30
cm of ileum. There is scattered gas and stool present throughout the
colon to the rectum. Redemonstrated distal small bowel resection and
reanastomosis in the low pelvis (series 2, image 66). Previously
seen percutaneous drainage catheter in the right hemipelvis is
removed. There is no significant residual fluid collection in this
vicinity. Sigmoid diverticulosis.

Vascular/Lymphatic: Aortic atherosclerosis. No enlarged abdominal or
pelvic lymph nodes.

Reproductive: No mass or other significant abnormality.

Other: No abdominal wall hernia or abnormality. New small volume
ascites throughout the abdomen and pelvis.

Musculoskeletal: No acute or significant osseous findings.
IMPRESSION: 1. The mid to distal small bowel is diffusely gas and fluid
distended, with multiple fecalized loops in the low abdomen and
pelvis. Loops measure up to 3.7 cm in caliber. There is a fecalized
loop with an abrupt caliber change in the low midline abdomen with
complete decompression of approximately the distal 30 cm of ileum.
Findings are consistent with small bowel obstruction, likely
secondary to adhesion.
2. Previously seen percutaneous drainage catheter in the right
hemipelvis is removed. There is no significant residual fluid
collection in this vicinity.
3. Redemonstrated distal small bowel resection and reanastomosis in
the low pelvis.
4. New small volume ascites throughout the abdomen and pelvis.

Aortic Atherosclerosis (GA9EI-TK8.8).

## 2022-11-22 ENCOUNTER — Inpatient Hospital Stay: Payer: Commercial Managed Care - PPO | Attending: Hematology

## 2022-11-22 DIAGNOSIS — Z85048 Personal history of other malignant neoplasm of rectum, rectosigmoid junction, and anus: Secondary | ICD-10-CM | POA: Diagnosis not present

## 2022-11-22 DIAGNOSIS — R7989 Other specified abnormal findings of blood chemistry: Secondary | ICD-10-CM

## 2022-11-22 LAB — CBC WITH DIFFERENTIAL/PLATELET
Abs Immature Granulocytes: 0.01 10*3/uL (ref 0.00–0.07)
Basophils Absolute: 0 10*3/uL (ref 0.0–0.1)
Basophils Relative: 0 %
Eosinophils Absolute: 0.1 10*3/uL (ref 0.0–0.5)
Eosinophils Relative: 2 %
HCT: 38.7 % — ABNORMAL LOW (ref 39.0–52.0)
Hemoglobin: 12.8 g/dL — ABNORMAL LOW (ref 13.0–17.0)
Immature Granulocytes: 0 %
Lymphocytes Relative: 25 %
Lymphs Abs: 1.3 10*3/uL (ref 0.7–4.0)
MCH: 28.1 pg (ref 26.0–34.0)
MCHC: 33.1 g/dL (ref 30.0–36.0)
MCV: 84.9 fL (ref 80.0–100.0)
Monocytes Absolute: 0.4 10*3/uL (ref 0.1–1.0)
Monocytes Relative: 7 %
Neutro Abs: 3.5 10*3/uL (ref 1.7–7.7)
Neutrophils Relative %: 66 %
Platelets: 186 10*3/uL (ref 150–400)
RBC: 4.56 MIL/uL (ref 4.22–5.81)
RDW: 15.1 % (ref 11.5–15.5)
WBC: 5.4 10*3/uL (ref 4.0–10.5)
nRBC: 0 % (ref 0.0–0.2)

## 2022-11-22 LAB — IRON AND IRON BINDING CAPACITY (CC-WL,HP ONLY)
Iron: 91 ug/dL (ref 45–182)
Saturation Ratios: 31 % (ref 17.9–39.5)
TIBC: 294 ug/dL (ref 250–450)
UIBC: 203 ug/dL (ref 117–376)

## 2022-11-22 LAB — FERRITIN: Ferritin: 1639 ng/mL — ABNORMAL HIGH (ref 24–336)

## 2022-11-23 ENCOUNTER — Encounter: Payer: Self-pay | Admitting: Hematology

## 2022-11-25 ENCOUNTER — Inpatient Hospital Stay: Payer: Commercial Managed Care - PPO

## 2022-11-25 ENCOUNTER — Other Ambulatory Visit: Payer: Self-pay

## 2022-11-25 DIAGNOSIS — Z85048 Personal history of other malignant neoplasm of rectum, rectosigmoid junction, and anus: Secondary | ICD-10-CM | POA: Diagnosis not present

## 2022-11-25 MED ORDER — SODIUM CHLORIDE 0.9 % IV SOLN: Freq: Once | INTRAVENOUS | Status: AC

## 2022-11-25 NOTE — Patient Instructions (Signed)

## 2022-12-17 ENCOUNTER — Other Ambulatory Visit (HOSPITAL_COMMUNITY): Payer: Self-pay

## 2022-12-19 ENCOUNTER — Other Ambulatory Visit (HOSPITAL_COMMUNITY): Payer: Self-pay

## 2022-12-19 MED ORDER — HYDROCHLOROTHIAZIDE 25 MG PO TABS
25.0000 mg | ORAL_TABLET | Freq: Every day | ORAL | 0 refills | Status: DC
  Filled 2022-12-19: qty 90, 90d supply, fill #0

## 2022-12-21 ENCOUNTER — Other Ambulatory Visit (HOSPITAL_COMMUNITY): Payer: Self-pay

## 2022-12-23 DIAGNOSIS — R799 Abnormal finding of blood chemistry, unspecified: Secondary | ICD-10-CM | POA: Diagnosis not present

## 2022-12-23 DIAGNOSIS — D649 Anemia, unspecified: Secondary | ICD-10-CM | POA: Diagnosis not present

## 2022-12-23 DIAGNOSIS — Z0000 Encounter for general adult medical examination without abnormal findings: Secondary | ICD-10-CM | POA: Diagnosis not present

## 2023-02-28 ENCOUNTER — Inpatient Hospital Stay: Payer: Commercial Managed Care - PPO

## 2023-02-28 ENCOUNTER — Telehealth: Payer: Self-pay | Admitting: Hematology

## 2023-03-03 ENCOUNTER — Inpatient Hospital Stay: Payer: Commercial Managed Care - PPO

## 2023-03-21 ENCOUNTER — Other Ambulatory Visit: Payer: Self-pay

## 2023-03-21 ENCOUNTER — Other Ambulatory Visit (HOSPITAL_COMMUNITY): Payer: Self-pay

## 2023-03-21 ENCOUNTER — Other Ambulatory Visit: Payer: Self-pay | Admitting: Hematology

## 2023-03-21 MED ORDER — TIMOLOL MALEATE 0.5 % OP SOLN
1.0000 [drp] | Freq: Every morning | OPHTHALMIC | 11 refills | Status: DC
  Filled 2023-03-21: qty 5, 90d supply, fill #0
  Filled 2023-06-22: qty 5, 90d supply, fill #1
  Filled 2023-09-20: qty 5, 90d supply, fill #2
  Filled 2024-01-16: qty 5, 90d supply, fill #3

## 2023-03-21 MED ORDER — HYDROCHLOROTHIAZIDE 25 MG PO TABS
25.0000 mg | ORAL_TABLET | Freq: Every day | ORAL | 0 refills | Status: DC
  Filled 2023-03-21: qty 90, 90d supply, fill #0

## 2023-05-29 ENCOUNTER — Other Ambulatory Visit: Payer: Self-pay

## 2023-05-29 DIAGNOSIS — C20 Malignant neoplasm of rectum: Secondary | ICD-10-CM

## 2023-05-30 ENCOUNTER — Inpatient Hospital Stay: Payer: Commercial Managed Care - PPO | Attending: Hematology

## 2023-05-30 DIAGNOSIS — Z85048 Personal history of other malignant neoplasm of rectum, rectosigmoid junction, and anus: Secondary | ICD-10-CM | POA: Diagnosis not present

## 2023-05-30 DIAGNOSIS — C20 Malignant neoplasm of rectum: Secondary | ICD-10-CM

## 2023-05-30 DIAGNOSIS — D649 Anemia, unspecified: Secondary | ICD-10-CM | POA: Insufficient documentation

## 2023-05-30 LAB — IRON AND IRON BINDING CAPACITY (CC-WL,HP ONLY)
Iron: 88 ug/dL (ref 45–182)
Saturation Ratios: 29 % (ref 17.9–39.5)
TIBC: 302 ug/dL (ref 250–450)
UIBC: 214 ug/dL (ref 117–376)

## 2023-05-30 LAB — CBC WITH DIFFERENTIAL (CANCER CENTER ONLY)
Abs Immature Granulocytes: 0.01 10*3/uL (ref 0.00–0.07)
Basophils Absolute: 0 10*3/uL (ref 0.0–0.1)
Basophils Relative: 0 %
Eosinophils Absolute: 0.2 10*3/uL (ref 0.0–0.5)
Eosinophils Relative: 3 %
HCT: 38.4 % — ABNORMAL LOW (ref 39.0–52.0)
Hemoglobin: 12.6 g/dL — ABNORMAL LOW (ref 13.0–17.0)
Immature Granulocytes: 0 %
Lymphocytes Relative: 22 %
Lymphs Abs: 1.6 10*3/uL (ref 0.7–4.0)
MCH: 28.1 pg (ref 26.0–34.0)
MCHC: 32.8 g/dL (ref 30.0–36.0)
MCV: 85.7 fL (ref 80.0–100.0)
Monocytes Absolute: 0.6 10*3/uL (ref 0.1–1.0)
Monocytes Relative: 9 %
Neutro Abs: 4.7 10*3/uL (ref 1.7–7.7)
Neutrophils Relative %: 66 %
Platelet Count: 266 10*3/uL (ref 150–400)
RBC: 4.48 MIL/uL (ref 4.22–5.81)
RDW: 14.9 % (ref 11.5–15.5)
WBC Count: 7.1 10*3/uL (ref 4.0–10.5)
nRBC: 0 % (ref 0.0–0.2)

## 2023-05-30 LAB — FERRITIN: Ferritin: 1591 ng/mL — ABNORMAL HIGH (ref 24–336)

## 2023-06-02 ENCOUNTER — Inpatient Hospital Stay: Payer: Commercial Managed Care - PPO

## 2023-06-16 ENCOUNTER — Telehealth: Payer: Self-pay

## 2023-06-16 NOTE — Telephone Encounter (Addendum)
 Called patient to relay message below as per Santiago Glad NP. He voiced full understanding and had no further questions at this time. Will forward this message to scheduling to have his appointment made.    ----- Message from Pollyann Samples sent at 06/13/2023  9:48 AM EDT ----- Please let pt know labs and schedule phlebotomy (1/2 unit + IVF) based on 05/30/23 labs.   Thanks Lacie NP

## 2023-06-20 ENCOUNTER — Telehealth: Payer: Self-pay | Admitting: Hematology

## 2023-06-21 ENCOUNTER — Other Ambulatory Visit: Payer: Self-pay

## 2023-06-21 DIAGNOSIS — C20 Malignant neoplasm of rectum: Secondary | ICD-10-CM

## 2023-06-21 NOTE — Assessment & Plan Note (Signed)
-  diagnosed in 08/2016, pT2N0 --resection on 09/01/16 by Dr. Thomas, surgical path reviewed a 1.3 cm adenocarcinoma with invasion into anal sphincter muscle.  -Pelvic MRI 09/22/16 showed no residual tumor and no adenopathy.  -Patient completed adjuvant concurrent chemo and radiation 10/10/16-11/22/16, tolerated well overall. -repeated colonoscopy in 03/2021 showed Stricture in the distal sigmoid colon, scope was not able to pass, not malignant appearing, not biopsied -He is clinically doing well, lab reviewed, exam was unremarkable, there is no clinical concern for recurrence. -He has completed 5 years of surveillance.  

## 2023-06-21 NOTE — Assessment & Plan Note (Signed)
He was referred for high ferritin level, and liver MRI confirmed iron overload --He is on phlebotomy, he had a severe vasovagal after the first phlebotomy -I have changed his phlebotomy to half unit with IVF for future phlebotomy, he tolerated much better  -He is serum iron and saturation has been normal, however he has persistent elevated ferritin, did not change much since he started phlebotomy.  He is elevated ferritin is likely related to his chronic inflammation.   

## 2023-06-22 ENCOUNTER — Inpatient Hospital Stay

## 2023-06-22 ENCOUNTER — Inpatient Hospital Stay: Attending: Hematology | Admitting: Hematology

## 2023-06-22 ENCOUNTER — Telehealth: Payer: Self-pay | Admitting: Pharmacist

## 2023-06-22 ENCOUNTER — Other Ambulatory Visit (HOSPITAL_COMMUNITY): Payer: Self-pay

## 2023-06-22 ENCOUNTER — Other Ambulatory Visit: Payer: Self-pay

## 2023-06-22 VITALS — BP 138/74 | HR 63 | Temp 98.2°F | Resp 17 | Wt 132.7 lb

## 2023-06-22 DIAGNOSIS — C20 Malignant neoplasm of rectum: Secondary | ICD-10-CM | POA: Diagnosis not present

## 2023-06-22 DIAGNOSIS — R7989 Other specified abnormal findings of blood chemistry: Secondary | ICD-10-CM

## 2023-06-22 DIAGNOSIS — M5459 Other low back pain: Secondary | ICD-10-CM | POA: Diagnosis not present

## 2023-06-22 DIAGNOSIS — Z85048 Personal history of other malignant neoplasm of rectum, rectosigmoid junction, and anus: Secondary | ICD-10-CM | POA: Insufficient documentation

## 2023-06-22 DIAGNOSIS — M25562 Pain in left knee: Secondary | ICD-10-CM | POA: Diagnosis not present

## 2023-06-22 LAB — CBC WITH DIFFERENTIAL (CANCER CENTER ONLY)
Abs Immature Granulocytes: 0.02 10*3/uL (ref 0.00–0.07)
Basophils Absolute: 0 10*3/uL (ref 0.0–0.1)
Basophils Relative: 0 %
Eosinophils Absolute: 0.4 10*3/uL (ref 0.0–0.5)
Eosinophils Relative: 4 %
HCT: 37.6 % — ABNORMAL LOW (ref 39.0–52.0)
Hemoglobin: 12.6 g/dL — ABNORMAL LOW (ref 13.0–17.0)
Immature Granulocytes: 0 %
Lymphocytes Relative: 21 %
Lymphs Abs: 1.9 10*3/uL (ref 0.7–4.0)
MCH: 28.3 pg (ref 26.0–34.0)
MCHC: 33.5 g/dL (ref 30.0–36.0)
MCV: 84.3 fL (ref 80.0–100.0)
Monocytes Absolute: 0.8 10*3/uL (ref 0.1–1.0)
Monocytes Relative: 9 %
Neutro Abs: 6 10*3/uL (ref 1.7–7.7)
Neutrophils Relative %: 66 %
Platelet Count: 223 10*3/uL (ref 150–400)
RBC: 4.46 MIL/uL (ref 4.22–5.81)
RDW: 15.1 % (ref 11.5–15.5)
WBC Count: 9 10*3/uL (ref 4.0–10.5)
nRBC: 0 % (ref 0.0–0.2)

## 2023-06-22 LAB — IRON AND IRON BINDING CAPACITY (CC-WL,HP ONLY)
Iron: 69 ug/dL (ref 45–182)
Saturation Ratios: 24 % (ref 17.9–39.5)
TIBC: 293 ug/dL (ref 250–450)
UIBC: 224 ug/dL (ref 117–376)

## 2023-06-22 LAB — FERRITIN: Ferritin: 1498 ng/mL — ABNORMAL HIGH (ref 24–336)

## 2023-06-22 MED ORDER — DEFERASIROX 180 MG PO TABS: 360.0000 mg | ORAL_TABLET | Freq: Every day | ORAL | 0 refills | Status: DC

## 2023-06-22 MED ORDER — PREDNISONE 10 MG (21) PO TBPK
10.0000 mg | ORAL_TABLET | ORAL | 0 refills | Status: DC
  Filled 2023-06-22: qty 21, 6d supply, fill #0

## 2023-06-22 MED ORDER — HYDROCHLOROTHIAZIDE 25 MG PO TABS
25.0000 mg | ORAL_TABLET | Freq: Every day | ORAL | 0 refills | Status: DC
  Filled 2023-06-22: qty 30, 30d supply, fill #0

## 2023-06-22 MED ORDER — DICLOFENAC SODIUM 75 MG PO TBEC
75.0000 mg | DELAYED_RELEASE_TABLET | Freq: Two times a day (BID) | ORAL | 0 refills | Status: DC
  Filled 2023-06-22: qty 60, 30d supply, fill #0

## 2023-06-22 NOTE — Telephone Encounter (Signed)
 Oral Oncology Pharmacist Encounter   Received notification that prior authorization for Deferasirox is required.   PA submitted on CoverMyMeds Key: [id number] Status is pending   Oral Oncology Clinic will continue to follow.   Jude Norton, PharmD, BCPS, BCOP Hematology/Oncology Clinical Pharmacist [facility] and [facility] Oral Chemotherapy Navigation Clinics [phone] 06/22/2023 2:59 PM

## 2023-06-22 NOTE — Telephone Encounter (Signed)
 Oral Oncology Pharmacist Encounter  Received new prescription for Jadenu (deferasirox) for the treatment of iron overload, planned duration until normalization of ferritin levels.  CBC w/ Diff from 06/22/23 assessed, no baseline dose adjustments required. Patient ferritin from 05/30/23 is 1591 ng/mL (still pending for 06/22/23). Based on current Rx patient will start out on ~6 mg/kg for first 2 weeks, and if tolerate dose escalate to ~9 mg/kg thereafter if tolerated. Dose may be further adjusted depending on ferritin levels and response. Prescription dose and frequency assessed for appropriateness.  Current medication list in Epic reviewed, DDIs with Jadenu identified: Category C drug-drug interaction between Jadenu and Prednisone - prednisone may increase risk of GI ulceration/irritation from Jadenu. Noted patient only on prednisone taper and this will not be a long-term medication. No changes in therapy warranted at this time.   Evaluated chart and no patient barriers to medication adherence noted.   Prescription has been e-scribed to the [facility] for benefits analysis and approval.  Oral Oncology Clinic will continue to follow for insurance authorization, copayment issues, initial counseling and start date.  Jude Norton, PharmD, BCPS, BCOP Hematology/Oncology Clinical Pharmacist [facility] and [facility] Oral Chemotherapy Navigation Clinics [phone] 06/22/2023 2:57 PM

## 2023-06-22 NOTE — Progress Notes (Signed)
 [facility]   Telephone:([phone] Fax:(336) [phone]   Clinic Follow up Note   Patient Care Team: Malachy Mood, MD as PCP - General (Hematology) Romie Levee, MD as Consulting Physician (General Surgery) Dorothy Puffer, MD as Consulting Physician (Radiation Oncology) Malachy Mood, MD as Consulting Physician (Hematology) Iva Boop, MD as Consulting Physician (Gastroenterology) Fritzi Mandes, MD as Consulting Physician (General Surgery)  Date of Service:  06/22/2023  CHIEF COMPLAINT: f/u of hemachromatosis   CURRENT THERAPY:  Phlebotomy as needed   Oncology History   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 Maisie Fus, surgical path reviewed a 1.3 cm adenocarcinoma with invasion into anal sphincter muscle.  -Pelvic MRI 09/22/16 showed no residual tumor and no adenopathy.  -Patient completed adjuvant concurrent chemo and radiation 10/10/16-11/22/16, tolerated well overall. -repeated colonoscopy in 03/2021 showed Stricture in the distal sigmoid colon, scope was not able to pass, not malignant appearing, not biopsied -He is clinically doing well, lab reviewed, exam was unremarkable, there is no clinical concern for recurrence. -He has completed 5 years of surveillance.   iron overload He was referred for high ferritin level, and liver MRI confirmed iron overload --He is on phlebotomy, he had a severe vasovagal after the first phlebotomy -I have changed his phlebotomy to half unit with IVF for future phlebotomy, he tolerated much better  -He is serum iron and saturation has been normal, however he has persistent elevated ferritin, did not change much since he started phlebotomy.  He is elevated ferritin is likely related to his chronic inflammation.   Assessment & Plan Hemachromatosis, genetics (-) Iron overload persists with ferritin levels between 1300 to 1600, despite seven phlebotomies. Anemia and adverse effects, such as  fainting, limit phlebotomy use. Iron chelation therapy Deferasirox was discussed as an alternative, considering his concerns about gastrointestinal side effects due to prior intestinal surgery. Side effects are dose-dependent and include gastrointestinal upset, diarrhea, nausea, with a small risk of ocular and auditory changes. He is willing to initiate therapy at a low dose, with the option to revert to phlebotomy if intolerable. The target is to reduce ferritin levels below 500. - Initiate iron chelation therapy with one tablet daily for the first week, then increase to two tablets daily, adjusting gradually if tolerated. - Monitor ferritin levels and adjust medication dose accordingly. - Schedule MRI to assess liver iron deposits in early 2026. - Monitor liver and kidney function monthly. - Conduct annual ophthalmologic exams and monitor for auditory changes. - Continue dietary modifications to reduce iron intake, particularly reducing red meat consumption.  Anemia Anemia limits phlebotomy as a treatment for iron overload and is a factor in considering iron chelation therapy. - Monitor hemoglobin levels regularly. - Adjust treatment plan based on anemia status.  Plan -I called in deferasirox 180mg  for him, he will start 1 tab daily for first week, then 2 tabs for week 2 and 3 tabs for week 3 and after -f/u in 4 weeks    SUMMARY OF ONCOLOGIC HISTORY: Oncology History Overview Note  Cancer Staging Rectal adenocarcinoma [facility]) Staging form: Colon and Rectum, AJCC 8th Edition - Pathologic stage from 09/01/2016: Stage I (pT2, pN0, cM0) - Signed by Malachy Mood, MD on 10/01/2016     Adenocarcinoma of rectum (HCC)  09/01/2016 Initial Diagnosis   Rectal adenocarcinoma (HCC)   09/01/2016 Surgery   Hemorrhoidectomy 09/01/2016. Noted to have a mobile right lateral anal canal mass. Dr. Romie Levee.  09/01/2016 Pathology Results   Invasive adenocarcinoma, well-differentiated, spanning 1.3 cm.  The tumor invaded into the anal sphincter muscle. Resection margins were negative. The pathologist notes the tumor would be best staged as pT2 given involvement of muscle.    09/06/2016 Imaging   CT scans chest/abdomen/pelvis 09/06/2016 showed no evidence of metastatic disease.    09/22/2016 Imaging   Pelvic MRI 09/22/2016 showed no definite residual anal tumor. There was no evidence of tumor involvement of the external sphincter or adjacent organs. There was no pelvic lymphadenopathy.   10/10/2016 - 11/22/2016 Radiation Therapy   Patient begun adjuvant radiation with Dr. Mitzi Hansen   10/10/2016 - 11/22/2016 Chemotherapy   xeloda 1500mg , twice daily.   11/24/2017 Imaging   CT CAP W contrast 11/24/17  IMPRESSION: 1. Stable exam. No new or progressive interval findings to suggest metastatic disease. 2.  Aortic Atherosclerois (ICD10-170.0) 3.  Emphysema. ([id number])       Discussed the use of AI scribe software for clinical note transcription with the patient, who gave verbal consent to proceed.  History of Present Illness The patient, with a history of iron overload, has been undergoing phlebotomy treatments. He reports that his ferritin levels have been gradually decreasing, which he attributes to dietary changes. He has been trying to reduce his intake of foods that can cause ferritin to elevate. Despite these changes, his ferritin levels remain high, although slightly lower compared to the highest recorded level. The patient has had about seven phlebotomy treatments, but he experienced issues such as passing out after the first treatment. He is now considering whether to continue with the phlebotomy treatments or to start on medication to remove iron from his body. The patient also has a history of ischemic bowel rupture and intestinal surgery, which raises concerns about potential gastrointestinal side effects from the medication.     All other systems were reviewed with the patient and are  negative.  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 eye, as defined in [place]    per pt only see light   PFO (patent foramen ovale)    per TEE done 05-11-2009    Rectal adenocarcinoma (HCC) 09/28/2016   Traumatic glaucoma, 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 at [facility] in [place]   08-18-2001   Wears glasses     SURGICAL HISTORY: Past Surgical History:  Procedure Laterality Date   ARTHROSCOPIC REPAIR ACL Right 1998   BIOPSY  03/16/2021   Procedure: BIOPSY;  Surgeon: Iva Boop, MD;  Location: [facility];  Service: Endoscopy;;   ESOPHAGOGASTRODUODENOSCOPY (EGD) WITH PROPOFOL N/A 03/16/2021   Procedure: ESOPHAGOGASTRODUODENOSCOPY (EGD) WITH PROPOFOL;  Surgeon: Iva Boop, MD;  Location: [facility];  Service: Endoscopy;  Laterality: N/A;   EXPLORATION AND REPAIR LEFT EYE INJURY  08-18-2001   [place]   ruptured globe- repair corneal laceration, reposition of prolapsed uveal tissue   HEMORRHOID SURGERY N/A 09/01/2016   Procedure: HEMORRHOIDECTOMY;  Surgeon: Romie Levee, MD;  Location: [facility];  Service: General;  Laterality: N/A;   LAPAROTOMY N/A 12/04/2020   Procedure: EXPLORATORY LAPAROTOMY small bowel resection;  Surgeon: Fritzi Mandes, MD;  Location: [facility] OR;  Service: General;  Laterality: N/A;   SUPERFICIAL KERATECTOMY Left 06-29-2011    [facility]    with EDTA scrub of left eye   TEE WITHOUT CARDIOVERSION  05-11-2009  dr Dietrich Pates   LVSEF 55-65%/  mild thickened AV without AI/  trace MR/ mixed fixed  artherosclerosis plaqueing thoracic aorta/  no evidence thrombus/  by agitated saling and color doppler there was a PFO    I have reviewed the social history and family history with the patient and they are unchanged from previous note.  ALLERGIES:  has no known allergies.  MEDICATIONS:  Current Outpatient Medications  Medication Sig Dispense Refill   acetaminophen (TYLENOL) 500 MG tablet Take 2 tablets (1,000 mg total) by mouth every 6 (six) hours as needed. 30 tablet 0   Deferasirox 180 MG TABS Take 2 tablets (360 mg total) by mouth daily. Increase to 3 tabs daily in 2 weeks 150 tablet 0   diclofenac (VOLTAREN) 75 MG EC tablet Take 1 tablet (75 mg total) by mouth 2 (two) times daily with meals. (Start on Day 7) 60 tablet 0   erythromycin ophthalmic ointment Apply 0.5 inches to left eye at bedtime for 10 days. 3.5 g 2   hydrochlorothiazide (HYDRODIURIL) 25 MG tablet Take 1 tablet (25 mg total) by mouth daily for hypertension. **DUE FOR LAB WORK NOW - PLEASE CALL TO SCHEDULE** 30 tablet 0   hydrochlorothiazide (HYDRODIURIL) 50 MG tablet Take 1/2 tablet (25 mg total) by mouth daily. 90 tablet 0   moxifloxacin (VIGAMOX) 0.5 % ophthalmic solution Place 1 drop into the left eye 4 (four) times daily for 10 days. 5 mL 5   Multiple Vitamin (MULTIVITAMIN WITH MINERALS) TABS tablet Take 1 tablet by mouth in the morning.     ondansetron (ZOFRAN) 8 MG tablet Take 1 tablet (8 mg total) by mouth every 8 (eight) hours as needed for nausea or vomiting. 10 tablet 0   polyethylene glycol (MIRALAX / GLYCOLAX) 17 g packet Take 17 g by mouth in the morning.     predniSONE (STERAPRED UNI-PAK 21 TAB) 10 MG (21) TBPK tablet Take as directed on package by mouth with meals. 21 tablet 0   timolol (TIMOPTIC) 0.5 % ophthalmic solution Place 1 drop into the left eye daily. 10 mL 11   timolol (TIMOPTIC) 0.5 % ophthalmic solution Place 1 drop into the left eye every morning. 10 mL 11   No current facility-administered  medications for this visit.    PHYSICAL EXAMINATION: ECOG PERFORMANCE STATUS: 1 - Symptomatic but completely ambulatory  Vitals:   06/22/23 1350  BP: 138/74  Pulse: 63  Resp: 17  Temp: 98.2 F (36.8 C)  SpO2: 99%   Wt Readings from Last 3 Encounters:  06/22/23 132 lb 11.2 oz (60.2 kg)  08/26/22 129 lb 11.2 oz (58.8 kg)  05/27/22 129 lb 4.8 oz (58.7 kg)     GENERAL:alert, no distress and comfortable SKIN: skin color, texture, turgor are normal, no rashes or significant lesions EYES: normal, Conjunctiva are pink and non-injected, sclera clear NECK: supple, thyroid normal size, non-tender, without nodularity LYMPH:  no palpable lymphadenopathy in the cervical, axillary  LUNGS: clear to auscultation and percussion with normal breathing effort HEART: regular rate & rhythm and no murmurs and  no lower extremity edema ABDOMEN:abdomen soft, non-tender and normal bowel sounds Musculoskeletal:no cyanosis of digits and no clubbing  NEURO: alert & oriented x 3 with fluent speech, no focal motor/sensory deficits  Physical Exam    LABORATORY DATA:  I have reviewed the data as listed    Latest Ref Rng & Units 06/22/2023    1:18 PM 05/30/2023    9:17 AM 11/22/2022    9:38 AM  CBC  WBC 4.0 - 10.5 K/uL 9.0  7.1  5.4   Hemoglobin 13.0 - 17.0 g/dL 16.1  09.6  04.5   Hematocrit 39.0 - 52.0 % 37.6  38.4  38.7   Platelets 150 - 400 K/uL 223  266  186         Latest Ref Rng & Units 08/23/2022   12:26 PM 03/30/2022    3:00 PM 03/28/2022    1:47 PM  CMP  Glucose 70 - 99 mg/dL 409  89  74   BUN 8 - 23 mg/dL 15  15  14    Creatinine 0.61 - 1.24 mg/dL 8.11  9.14  7.82   Sodium 135 - 145 mmol/L 137  138  138   Potassium 3.5 - 5.1 mmol/L 3.6  3.6  3.2   Chloride 98 - 111 mmol/L 104  105  106   CO2 22 - 32 mmol/L 29  25  26    Calcium 8.9 - 10.3 mg/dL 9.3  8.8  9.4   Total Protein 6.5 - 8.1 g/dL 6.8  7.4  6.6   Total Bilirubin 0.3 - 1.2 mg/dL 0.5  0.4  0.5   Alkaline Phos 38 - 126 U/L 89   86  98   AST 15 - 41 U/L 16  39  17   ALT 0 - 44 U/L 11  28  12        RADIOGRAPHIC STUDIES: I have personally reviewed the radiological images as listed and agreed with the findings in the report. No results found.    Orders Placed This Encounter  Procedures   Iron and TIBC    Standing Status:   Standing    Number of Occurrences:   30    Expiration Date:   06/21/2024   All questions were answered. The patient knows to call the clinic with any problems, questions or concerns. No barriers to learning was detected. The total time spent in the appointment was 30 minutes.     Sonja Delavan, MD 06/22/2023

## 2023-06-23 ENCOUNTER — Other Ambulatory Visit: Payer: Self-pay

## 2023-06-23 ENCOUNTER — Other Ambulatory Visit: Payer: Self-pay | Admitting: Hematology

## 2023-06-23 ENCOUNTER — Other Ambulatory Visit (HOSPITAL_COMMUNITY): Payer: Self-pay

## 2023-06-23 ENCOUNTER — Telehealth: Payer: Self-pay | Admitting: Nurse Practitioner

## 2023-06-23 ENCOUNTER — Telehealth: Payer: Self-pay | Admitting: Hematology

## 2023-06-23 MED ORDER — DEFERASIROX 180 MG PO TABS
540.0000 mg | ORAL_TABLET | Freq: Every day | ORAL | 0 refills | Status: DC
  Filled 2023-06-23: qty 90, 30d supply, fill #0

## 2023-06-23 MED ORDER — DEFERASIROX 180 MG PO TABS: 540.0000 mg | ORAL_TABLET | Freq: Every day | ORAL | 0 refills | Status: DC

## 2023-06-23 NOTE — Telephone Encounter (Signed)
 Oral Oncology Patient Advocate Encounter  Prior Authorization for Deferasirox  has been approved.    PA# [id number]  Effective dates: 06/23/23 through 12/20/23  Patients co-pay is $18.00.    Hansel Ley, CPhT Pharmacy Technician Coordinator [facility] [phone] (Ph) 06/23/2023 3:10 PM

## 2023-06-23 NOTE — Telephone Encounter (Signed)
 I was instructed to get Juan David scheduled for a phlebotomy, Reise stated that per yesterday's appointment he was advised not to get a phlebotomy with the new medication he is taking. Karthikeya stated that he would like to tal to Dr. Maryalice Smaller before he schedules a phlebotomy. I informed John.

## 2023-06-23 NOTE — Progress Notes (Signed)
 Specialty Pharmacy Initial Fill Coordination Note  Juan David is a 68 y.o. male contacted today regarding initial fill of specialty medication(s) Deferasirox   Patient requested [name] at [facility] at [employer] date: 06/26/23  Medication will be filled on 06/23/23.   Patient is aware of $18.00 copayment.    Hansel Ley, CPhT Pharmacy Technician Coordinator [facility] [phone] (Ph) 06/23/2023 3:39 PM

## 2023-06-23 NOTE — Telephone Encounter (Signed)
 Scheduled patient's appts. Advised patient to contact us if rescheduling is needed. Provided my direct line.

## 2023-06-23 NOTE — Progress Notes (Signed)
 Oral Chemotherapy Pharmacist Encounter  Patient's wife was counseled under telephone encounter from 06/22/23.  Jude Norton, PharmD, BCPS, BCOP Hematology/Oncology Clinical Pharmacist [facility] Oral Chemotherapy Navigation Clinics [phone] 06/23/2023 3:41 PM

## 2023-06-23 NOTE — Telephone Encounter (Signed)
 Oral Chemotherapy Pharmacist Encounter  I spoke with patient's wife for overview of: Jadenu  for the treatment of iron overload, planned duration until achievement of decreasing ferritin, goal < 500 ng/mL.  Counseled on administration, dosing, side effects, monitoring, drug-food interactions, safe handling, storage, and disposal.  Week 1: Take 1 tablet (180 mg total) by mouth daily x 7 days Week 2: Take 2 tablets (360 mg total) by mouth daily x 7 days Week 3 and thereafter: Take 3 tablets (540 mg total) by mouth daily   Instructed to take Jadenu  on an empty stomach at least 30 minutes before food, preferably at the same time each day.   Patient's wife knows to separate Jadenu  administration for aluminum antacids.  Jadenu  start date: 06/27/23  Adverse effects include but are not limited to: nausea, vomiting, GI upset/abdominal pain rash, decreased blood counts  Reviewed importance of keeping a medication schedule and plan for any missed doses. No barriers to medication adherence identified.  Medication reconciliation performed and medication/allergy list updated.  All questions answered.  Patient's wife voiced understanding and appreciation.   Medication education handout placed in mail for patient. Patient and patient's wife know to call the office with questions or concerns. Oral Chemotherapy Clinic phone number provided.   Jude Norton, PharmD, BCPS, BCOP Hematology/Oncology Clinical Pharmacist [facility] Oral Chemotherapy Navigation Clinics [phone] 06/23/2023 3:38 PM

## 2023-06-26 ENCOUNTER — Other Ambulatory Visit (HOSPITAL_COMMUNITY): Payer: Self-pay

## 2023-06-26 MED ORDER — ONDANSETRON HCL 8 MG PO TABS
8.0000 mg | ORAL_TABLET | Freq: Three times a day (TID) | ORAL | 0 refills | Status: DC | PRN
  Filled 2023-06-26: qty 10, 4d supply, fill #0

## 2023-06-27 ENCOUNTER — Other Ambulatory Visit: Payer: Self-pay

## 2023-06-27 ENCOUNTER — Encounter

## 2023-07-12 ENCOUNTER — Other Ambulatory Visit: Payer: Self-pay

## 2023-07-18 ENCOUNTER — Other Ambulatory Visit (HOSPITAL_COMMUNITY): Payer: Self-pay

## 2023-07-18 ENCOUNTER — Other Ambulatory Visit: Payer: Self-pay | Admitting: Hematology

## 2023-07-18 ENCOUNTER — Other Ambulatory Visit: Payer: Self-pay

## 2023-07-18 DIAGNOSIS — R7989 Other specified abnormal findings of blood chemistry: Secondary | ICD-10-CM

## 2023-07-18 MED ORDER — DEFERASIROX 180 MG PO TABS
540.0000 mg | ORAL_TABLET | Freq: Every day | ORAL | 0 refills | Status: DC
  Filled 2023-07-18 (×2): qty 90, 30d supply, fill #0

## 2023-07-18 NOTE — Progress Notes (Signed)
 Specialty Pharmacy Ongoing Clinical Assessment Note  Juan David is a 68 y.o. male who is being followed by the specialty pharmacy service for RxSp Oncology   Patient's specialty medication(s) reviewed today: Deferasirox    Missed doses in the last 4 weeks: 0   Patient/Caregiver did not have any additional questions or concerns.   Therapeutic benefit summary: Unable to assess   Adverse events/side effects summary: Experienced adverse events/side effects (mild diarrhea, recommended patient try Imodium AD PRN, he was understanding)   Patient's therapy is appropriate to: Continue    Goals Addressed             This Visit's Progress    Stabilization of disease   No change    Patient is initiating therapy. Patient will maintain adherence         Follow up: 3 months  Malachi Screws Specialty Pharmacist

## 2023-07-18 NOTE — Progress Notes (Signed)
 Specialty Pharmacy Refill Coordination Note  Juan David is a 68 y.o. male contacted today regarding refills of specialty medication(s) Deferasirox    Patient requested [name] at [facility] at [place] date: 07/27/23   Medication will be filled on 07/26/23. This fill date is pending response to refill request from provider. Patient is aware and if they have not received fill by intended date they must follow up with pharmacy.

## 2023-07-19 ENCOUNTER — Telehealth: Payer: Self-pay | Admitting: Hematology

## 2023-07-19 ENCOUNTER — Inpatient Hospital Stay: Attending: Hematology

## 2023-07-19 ENCOUNTER — Other Ambulatory Visit: Payer: Self-pay

## 2023-07-19 DIAGNOSIS — C20 Malignant neoplasm of rectum: Secondary | ICD-10-CM

## 2023-07-19 DIAGNOSIS — Z85048 Personal history of other malignant neoplasm of rectum, rectosigmoid junction, and anus: Secondary | ICD-10-CM | POA: Diagnosis not present

## 2023-07-19 LAB — CBC WITH DIFFERENTIAL (CANCER CENTER ONLY)
Abs Immature Granulocytes: 0.02 10*3/uL (ref 0.00–0.07)
Basophils Absolute: 0 10*3/uL (ref 0.0–0.1)
Basophils Relative: 1 %
Eosinophils Absolute: 0.1 10*3/uL (ref 0.0–0.5)
Eosinophils Relative: 2 %
HCT: 39 % (ref 39.0–52.0)
Hemoglobin: 13.2 g/dL (ref 13.0–17.0)
Immature Granulocytes: 0 %
Lymphocytes Relative: 17 %
Lymphs Abs: 1 10*3/uL (ref 0.7–4.0)
MCH: 28.1 pg (ref 26.0–34.0)
MCHC: 33.8 g/dL (ref 30.0–36.0)
MCV: 83 fL (ref 80.0–100.0)
Monocytes Absolute: 0.4 10*3/uL (ref 0.1–1.0)
Monocytes Relative: 7 %
Neutro Abs: 4.1 10*3/uL (ref 1.7–7.7)
Neutrophils Relative %: 73 %
Platelet Count: 260 10*3/uL (ref 150–400)
RBC: 4.7 MIL/uL (ref 4.22–5.81)
RDW: 14.6 % (ref 11.5–15.5)
WBC Count: 5.6 10*3/uL (ref 4.0–10.5)
nRBC: 0 % (ref 0.0–0.2)

## 2023-07-19 LAB — IRON AND IRON BINDING CAPACITY (CC-WL,HP ONLY)
Iron: 92 ug/dL (ref 45–182)
Saturation Ratios: 30 % (ref 17.9–39.5)
TIBC: 305 ug/dL (ref 250–450)
UIBC: 213 ug/dL (ref 117–376)

## 2023-07-20 ENCOUNTER — Inpatient Hospital Stay (HOSPITAL_BASED_OUTPATIENT_CLINIC_OR_DEPARTMENT_OTHER): Admitting: Nurse Practitioner

## 2023-07-20 ENCOUNTER — Encounter: Payer: Self-pay | Admitting: Nurse Practitioner

## 2023-07-20 ENCOUNTER — Inpatient Hospital Stay: Admitting: Hematology

## 2023-07-20 DIAGNOSIS — Z85048 Personal history of other malignant neoplasm of rectum, rectosigmoid junction, and anus: Secondary | ICD-10-CM | POA: Diagnosis not present

## 2023-07-20 LAB — FERRITIN: Ferritin: 2755 ng/mL — ABNORMAL HIGH (ref 24–336)

## 2023-07-20 NOTE — Progress Notes (Signed)
 [facility]   Telephone:(336) [phone] Fax:(336) [phone]    Patient Care Team: Sonja Harcourt, MD as PCP - General (Hematology) Joyce Nixon, MD as Consulting Physician (General Surgery) Johna Myers, MD as Consulting Physician (Radiation Oncology) Sonja Sachse, MD as Consulting Physician (Hematology) Kenney Peacemaker, MD as Consulting Physician (Gastroenterology) Lujean Sake, MD as Consulting Physician (General Surgery)   CHIEF COMPLAINT: Follow-up hemochromatosis   CURRENT THERAPY: Phlebotomy program, started Deferasirox  07/2023  INTERVAL HISTORY Juan David returns for follow up as scheduled. Last seen by Dr. Maryalice David 1 month ago. Started oral iron chelation therapy in the interim. Up to 2 tabs daily for the past 2 weeks. Having bothersome diarrhea with 4 loose/watery stools per day. Still taking miralax since previous colon surgery. Denies other side effects.   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   Hypertension    Iron deficiency    Legally blind in left eye, as defined in [place]     per pt only see light   PFO (patent foramen ovale)    per TEE done 05-11-2009    Rectal adenocarcinoma (HCC) 09/28/2016   Traumatic glaucoma, 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 Antonietta Kitty at [facility] in [place]   08-18-2001   Wears glasses      Past Surgical History:  Procedure Laterality Date   ARTHROSCOPIC REPAIR ACL Right 1998   BIOPSY  03/16/2021   Procedure: BIOPSY;  Surgeon: Kenney Peacemaker, MD;  Location: [facility];  Service: Endoscopy;;   ESOPHAGOGASTRODUODENOSCOPY (EGD)  WITH PROPOFOL  N/A 03/16/2021   Procedure: ESOPHAGOGASTRODUODENOSCOPY (EGD) WITH PROPOFOL ;  Surgeon: Kenney Peacemaker, MD;  Location: [facility];  Service: Endoscopy;  Laterality: N/A;   EXPLORATION AND REPAIR LEFT EYE INJURY  08-18-2001   [place]   ruptured globe- repair corneal laceration, reposition of prolapsed uveal tissue   HEMORRHOID SURGERY N/A 09/01/2016   Procedure: HEMORRHOIDECTOMY;  Surgeon: Joyce Nixon, MD;  Location: [facility];  Service: General;  Laterality: N/A;   LAPAROTOMY N/A 12/04/2020   Procedure: EXPLORATORY LAPAROTOMY small bowel resection;  Surgeon: Lujean Sake, MD;  Location: [facility];  Service: General;  Laterality: N/A;   SUPERFICIAL KERATECTOMY Left 06-29-2011    [facility]    with EDTA scrub of left eye   TEE WITHOUT CARDIOVERSION  05-11-2009  dr Ola Berger   LVSEF 55-65%/  mild thickened AV without AI/  trace MR/ mixed fixed artherosclerosis plaqueing thoracic aorta/  no evidence thrombus/  by agitated saling and color doppler there was a PFO     Outpatient Encounter Medications as of 07/20/2023  Medication Sig   acetaminophen  (TYLENOL ) 500 MG tablet Take 2 tablets (1,000 mg total) by mouth every 6 (six) hours as needed.   Deferasirox  180 MG TABS Take 3 tablets (540 mg total) by mouth daily. Increase tablets up to goal dose as instructed by MD. Take on an empty stomach at least 30 minutes before a meal.  diclofenac  (VOLTAREN ) 75 MG EC tablet Take 1 tablet (75 mg total) by mouth 2 (two) times daily with meals. (Start on Day 7)   erythromycin  ophthalmic ointment Apply 0.5 inches to left eye at bedtime for 10 days.   hydrochlorothiazide  (HYDRODIURIL ) 25 MG tablet Take 1 tablet (25 mg total) by mouth daily for hypertension. **DUE FOR LAB WORK NOW - PLEASE CALL TO SCHEDULE**   hydrochlorothiazide  (HYDRODIURIL ) 50 MG tablet Take 1/2 tablet (25 mg total) by mouth daily.   moxifloxacin  (VIGAMOX ) 0.5 % ophthalmic solution Place 1 drop into the left eye 4  (four) times daily for 10 days.   Multiple Vitamin (MULTIVITAMIN WITH MINERALS) TABS tablet Take 1 tablet by mouth in the morning.   ondansetron  (ZOFRAN ) 8 MG tablet Take 1 tablet (8 mg total) by mouth every 8 (eight) hours as needed for nausea or vomiting.   polyethylene glycol (MIRALAX / GLYCOLAX) 17 g packet Take 17 g by mouth in the morning.   predniSONE  (STERAPRED UNI-PAK 21 TAB) 10 MG (21) TBPK tablet Take as directed on package by mouth with meals.   timolol  (TIMOPTIC ) 0.5 % ophthalmic solution Place 1 drop into the left eye daily.   timolol  (TIMOPTIC ) 0.5 % ophthalmic solution Place 1 drop into the left eye every morning.   No facility-administered encounter medications on file as of 07/20/2023.     Today's Vitals   07/20/23 1315  BP: 130/72  Pulse: 71  Resp: 17  Temp: 98 F (36.7 C)  SpO2: 99%  Weight: 126 lb 6.4 oz (57.3 kg)   Body mass index is 17.63 kg/m.   PHYSICAL EXAM GENERAL:alert, no distress and comfortable SKIN: no rash  EYES: sclera clear LUNGS: clear with normal breathing effort HEART: regular rate & rhythm, no lower extremity edema ABDOMEN: abdomen soft, non-tender and normal bowel sounds NEURO: alert & oriented x 3 with fluent speech, no focal motor/sensory deficits   CBC    Latest Ref Rng & Units 07/19/2023   10:55 AM 06/22/2023    1:18 PM 05/30/2023    9:17 AM  CBC  WBC 4.0 - 10.5 K/uL 5.6  9.0  7.1   Hemoglobin 13.0 - 17.0 g/dL 16.1  09.6  04.5   Hematocrit 39.0 - 52.0 % 39.0  37.6  38.4   Platelets 150 - 400 K/uL 260  223  266       CMP     Latest Ref Rng & Units 08/23/2022   12:26 PM 03/30/2022    3:00 PM 03/28/2022    1:47 PM  CMP  Glucose 70 - 99 mg/dL 409  89  74   BUN 8 - 23 mg/dL 15  15  14    Creatinine 0.61 - 1.24 mg/dL 8.11  9.14  7.82   Sodium 135 - 145 mmol/L 137  138  138   Potassium 3.5 - 5.1 mmol/L 3.6  3.6  3.2   Chloride 98 - 111 mmol/L 104  105  106   CO2 22 - 32 mmol/L 29  25  26    Calcium 8.9 - 10.3 mg/dL 9.3  8.8   9.4   Total Protein 6.5 - 8.1 g/dL 6.8  7.4  6.6   Total Bilirubin 0.3 - 1.2 mg/dL 0.5  0.4  0.5   Alkaline Phos 38 - 126 U/L 89  86  98   AST 15 - 41 U/L 16  39  17   ALT 0 - 44 U/L 11  28  12  ASSESSMENT & PLAN:  Hemachromatosis, genetics -  -He was referred for high ferritin level, and liver MRI confirmed iron overload -Had a severe vasovagal after the first phlebotomy, tolerated 1/2 unit with IVF better but did not respond well  -Some component of chronic inflammatio -Started iron chelation with Deferasirox  titration, up to 2 tabs, SE's include diarrhea and weight loss from malabsorption, ferritin increased  -We reviewed symptom management: will reduce iron rich foods, increase water , and hold miralax for now. Increase Deferasirox  to 3 tabs -I offered 1/2 unit phlebotomy today but he could not stay  -Repeat lab in 2 and 4 weeks with iron studies, CBC, and renal function/electrolytes -F/up in 4 weeks or sooner if needed   Orders Placed This Encounter  Procedures   CMP (Cancer Center only)    Standing Status:   Standing    Number of Occurrences:   1    Expiration Date:   07/19/2024      All questions were answered. The patient knows to call the clinic with any problems, questions or concerns. No barriers to learning were detected.   Rosabella Edgin K Macintyre Alexa, NP 07/20/2023

## 2023-07-25 ENCOUNTER — Other Ambulatory Visit (HOSPITAL_COMMUNITY): Payer: Self-pay

## 2023-07-26 ENCOUNTER — Other Ambulatory Visit: Payer: Self-pay

## 2023-07-26 ENCOUNTER — Other Ambulatory Visit (HOSPITAL_COMMUNITY): Payer: Self-pay

## 2023-07-26 ENCOUNTER — Encounter: Payer: Self-pay | Admitting: Hematology

## 2023-07-26 MED ORDER — HYDROCHLOROTHIAZIDE 25 MG PO TABS
25.0000 mg | ORAL_TABLET | Freq: Every day | ORAL | 1 refills | Status: DC
  Filled 2023-07-26: qty 90, 90d supply, fill #0
  Filled 2023-09-20 – 2023-10-26 (×2): qty 90, 90d supply, fill #1

## 2023-08-03 ENCOUNTER — Inpatient Hospital Stay

## 2023-08-03 DIAGNOSIS — Z85048 Personal history of other malignant neoplasm of rectum, rectosigmoid junction, and anus: Secondary | ICD-10-CM | POA: Diagnosis not present

## 2023-08-03 DIAGNOSIS — C20 Malignant neoplasm of rectum: Secondary | ICD-10-CM

## 2023-08-03 LAB — CBC WITH DIFFERENTIAL (CANCER CENTER ONLY)
Abs Immature Granulocytes: 0.02 10*3/uL (ref 0.00–0.07)
Basophils Absolute: 0 10*3/uL (ref 0.0–0.1)
Basophils Relative: 1 %
Eosinophils Absolute: 0.2 10*3/uL (ref 0.0–0.5)
Eosinophils Relative: 4 %
HCT: 38.6 % — ABNORMAL LOW (ref 39.0–52.0)
Hemoglobin: 12.9 g/dL — ABNORMAL LOW (ref 13.0–17.0)
Immature Granulocytes: 0 %
Lymphocytes Relative: 23 %
Lymphs Abs: 1.4 10*3/uL (ref 0.7–4.0)
MCH: 28 pg (ref 26.0–34.0)
MCHC: 33.4 g/dL (ref 30.0–36.0)
MCV: 83.9 fL (ref 80.0–100.0)
Monocytes Absolute: 0.5 10*3/uL (ref 0.1–1.0)
Monocytes Relative: 9 %
Neutro Abs: 3.9 10*3/uL (ref 1.7–7.7)
Neutrophils Relative %: 63 %
Platelet Count: 220 10*3/uL (ref 150–400)
RBC: 4.6 MIL/uL (ref 4.22–5.81)
RDW: 14.6 % (ref 11.5–15.5)
WBC Count: 6.1 10*3/uL (ref 4.0–10.5)
nRBC: 0 % (ref 0.0–0.2)

## 2023-08-03 LAB — CMP (CANCER CENTER ONLY)
ALT: 14 U/L (ref 0–44)
AST: 19 U/L (ref 15–41)
Albumin: 4.6 g/dL (ref 3.5–5.0)
Alkaline Phosphatase: 102 U/L (ref 38–126)
Anion gap: 7 (ref 5–15)
BUN: 20 mg/dL (ref 8–23)
CO2: 27 mmol/L (ref 22–32)
Calcium: 9.8 mg/dL (ref 8.9–10.3)
Chloride: 106 mmol/L (ref 98–111)
Creatinine: 1.06 mg/dL (ref 0.61–1.24)
GFR, Estimated: >60 mL/min (ref >60)
Glucose, Bld: 98 mg/dL (ref 70–99)
Potassium: 4.4 mmol/L (ref 3.5–5.1)
Sodium: 140 mmol/L (ref 135–145)
Total Bilirubin: 0.4 mg/dL (ref 0.0–1.2)
Total Protein: 7.7 g/dL (ref 6.5–8.1)

## 2023-08-03 LAB — FERRITIN: Ferritin: 2238 ng/mL — ABNORMAL HIGH (ref 24–336)

## 2023-08-03 LAB — IRON AND IRON BINDING CAPACITY (CC-WL,HP ONLY)
Iron: 98 ug/dL (ref 45–182)
Saturation Ratios: 33 % (ref 17.9–39.5)
TIBC: 298 ug/dL (ref 250–450)
UIBC: 200 ug/dL (ref 117–376)

## 2023-08-07 ENCOUNTER — Ambulatory Visit: Payer: Self-pay | Admitting: Nurse Practitioner

## 2023-08-09 ENCOUNTER — Inpatient Hospital Stay: Attending: Hematology

## 2023-08-09 DIAGNOSIS — C20 Malignant neoplasm of rectum: Secondary | ICD-10-CM

## 2023-08-09 DIAGNOSIS — Z85048 Personal history of other malignant neoplasm of rectum, rectosigmoid junction, and anus: Secondary | ICD-10-CM | POA: Diagnosis not present

## 2023-08-09 LAB — CBC WITH DIFFERENTIAL (CANCER CENTER ONLY)
Abs Immature Granulocytes: 0.02 10*3/uL (ref 0.00–0.07)
Basophils Absolute: 0 10*3/uL (ref 0.0–0.1)
Basophils Relative: 1 %
Eosinophils Absolute: 0.2 10*3/uL (ref 0.0–0.5)
Eosinophils Relative: 3 %
HCT: 39 % (ref 39.0–52.0)
Hemoglobin: 13.1 g/dL (ref 13.0–17.0)
Immature Granulocytes: 0 %
Lymphocytes Relative: 22 %
Lymphs Abs: 1.4 10*3/uL (ref 0.7–4.0)
MCH: 27.9 pg (ref 26.0–34.0)
MCHC: 33.6 g/dL (ref 30.0–36.0)
MCV: 83 fL (ref 80.0–100.0)
Monocytes Absolute: 0.6 10*3/uL (ref 0.1–1.0)
Monocytes Relative: 10 %
Neutro Abs: 3.9 10*3/uL (ref 1.7–7.7)
Neutrophils Relative %: 64 %
Platelet Count: 258 10*3/uL (ref 150–400)
RBC: 4.7 MIL/uL (ref 4.22–5.81)
RDW: 14.5 % (ref 11.5–15.5)
WBC Count: 6.1 10*3/uL (ref 4.0–10.5)
nRBC: 0 % (ref 0.0–0.2)

## 2023-08-09 LAB — IRON AND IRON BINDING CAPACITY (CC-WL,HP ONLY)
Iron: 121 ug/dL (ref 45–182)
Saturation Ratios: 42 % — ABNORMAL HIGH (ref 17.9–39.5)
TIBC: 288 ug/dL (ref 250–450)
UIBC: 167 ug/dL (ref 117–376)

## 2023-08-09 LAB — FERRITIN: Ferritin: 2665 ng/mL — ABNORMAL HIGH (ref 24–336)

## 2023-08-10 ENCOUNTER — Other Ambulatory Visit: Payer: Self-pay

## 2023-08-10 ENCOUNTER — Inpatient Hospital Stay (HOSPITAL_BASED_OUTPATIENT_CLINIC_OR_DEPARTMENT_OTHER): Admitting: Hematology

## 2023-08-10 VITALS — BP 138/78 | HR 76 | Temp 97.2°F | Resp 17 | Wt 128.5 lb

## 2023-08-10 DIAGNOSIS — R7989 Other specified abnormal findings of blood chemistry: Secondary | ICD-10-CM

## 2023-08-10 DIAGNOSIS — Z85048 Personal history of other malignant neoplasm of rectum, rectosigmoid junction, and anus: Secondary | ICD-10-CM | POA: Diagnosis not present

## 2023-08-10 MED ORDER — DEFERASIROX 180 MG PO TABS
720.0000 mg | ORAL_TABLET | Freq: Every day | ORAL | 2 refills | Status: DC
  Filled 2023-08-10 (×2): qty 120, 30d supply, fill #0
  Filled 2023-08-30 – 2023-09-12 (×3): qty 120, 30d supply, fill #1
  Filled 2023-10-05 – 2023-10-10 (×2): qty 120, 30d supply, fill #2

## 2023-08-10 NOTE — Assessment & Plan Note (Signed)
 He was referred for high ferritin level, and liver MRI confirmed iron overload -HFE  genetic test was negative  --He is on phlebotomy, he had a severe vasovagal after the first phlebotomy -I have changed his phlebotomy to half unit with IVF for future phlebotomy, he tolerated much better  -He is serum iron and saturation has been normal, however he has persistent elevated ferritin, did not change much since he started phlebotomy.  He is elevated ferritin is likely related to his chronic inflammation. -He started deferasirox  180mg  in April 2025, will titrate dose per tolerance and monitor iron levels

## 2023-08-10 NOTE — Progress Notes (Signed)
 Specialty Pharmacy Refill Coordination Note  Juan David is a 68 y.o. male contacted today regarding refills of specialty medication(s) Deferasirox    Patient requested [name] at [facility] at [place] date: 08/11/23   Medication will be filled on 08/11/23.

## 2023-08-11 ENCOUNTER — Other Ambulatory Visit: Payer: Self-pay

## 2023-08-11 ENCOUNTER — Encounter: Payer: Self-pay | Admitting: Hematology

## 2023-08-11 DIAGNOSIS — R7989 Other specified abnormal findings of blood chemistry: Secondary | ICD-10-CM

## 2023-08-11 NOTE — Progress Notes (Signed)
 [facility]   Telephone:(336) [phone] Fax:(336) [phone]   Clinic Follow up Note   Patient Care Team: Sonja Zion, MD as PCP - General (Hematology) Joyce Nixon, MD as Consulting Physician (General Surgery) Johna Myers, MD as Consulting Physician (Radiation Oncology) Sonja [place], MD as Consulting Physician (Hematology) Kenney Peacemaker, MD as Consulting Physician (Gastroenterology) Lujean Sake, MD as Consulting Physician (General Surgery)  Date of Service: 08/10/2023  CHIEF COMPLAINT: f/u of iron overload  CURRENT THERAPY:  Deferasirox , on 540mg  daily now   Oncology History   iron overload He was referred for high ferritin level, and liver MRI confirmed iron overload -HFE  genetic test was negative  --He is on phlebotomy, he had a severe vasovagal after the first phlebotomy -I have changed his phlebotomy to half unit with IVF for future phlebotomy, he tolerated much better  -He is serum iron and saturation has been normal, however he has persistent elevated ferritin, did not change much since he started phlebotomy.  He is elevated ferritin is likely related to his chronic inflammation. -He started deferasirox  180mg  in April 2025, will titrate dose per tolerance and monitor iron levels      Assessment & Plan Hemochromatosis Chronic hemochromatosis with ferritin levels around 2000, potentially influenced by chronic inflammation from smoking. Serum iron levels are normal. He is on iron chelation therapy, experiencing gastrointestinal side effects such as diarrhea and appetite suppression. Previous phlebotomy was discontinued. - Order MRI to assess liver iron deposition within the next three months, ideally a week before the next follow-up appointment. - Continue current iron chelation therapy and consider increasing the dose to four tablets daily if tolerated, discussing potential gastrointestinal side effects and appetite suppression. - Schedule phlebotomy within the  next month and assess its effectiveness in conjunction with medication. - Check iron levels in six weeks to monitor trends. - Consider liver or bone marrow biopsy if MRI results are inconclusive, though this is invasive and a last resort. - Encourage smoking cessation to reduce inflammation and ferritin levels.  Diarrhea due to medication Diarrhea occurring two to three times daily, associated with iron chelation therapy. He has been advised against using Imodium due to previous surgery and the need to maintain regular bowel movements. - Monitor diarrhea symptoms and adjust iron chelation therapy dose as needed. - Advise dietary modifications to minimize diarrhea, including reducing dairy and high-fiber foods, and increasing water  intake.  Smoking Smokes approximately one to one and a half small cigars daily. He is not currently motivated to quit but acknowledges a plan to eventually stop. - Discuss the potential impact of smoking on ferritin levels and overall health.  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 He is under annual ophthalmologic care. Iron chelation therapy has potential ocular side effects, but he is not on a high enough dose to warrant immediate concern. - Inform ophthalmologist about iron chelation therapy due to potential ocular side effects. - Continue annual ophthalmologic evaluations.  Follow-up - he will increase deferasirox  to 4 tabs daily, I refilled for him today  -Schedule follow-up appointment in three months. - Ensure MRI is completed a week before the follow-up appointment. - Check iron levels in six weeks. -schedule phlebotomy within the next month.     SUMMARY OF ONCOLOGIC HISTORY: Oncology History Overview Note  Cancer Staging Rectal adenocarcinoma [facility]) Staging form: Colon and Rectum, AJCC 8th Edition - Pathologic stage from 09/01/2016: Stage I (  pT2, pN0, cM0) - Signed by Sonja [place], MD on  10/01/2016     Adenocarcinoma of rectum (HCC)  09/01/2016 Initial Diagnosis   Rectal adenocarcinoma (HCC)   09/01/2016 Surgery   Hemorrhoidectomy 09/01/2016. Noted to have a mobile right lateral anal canal mass. Dr. Joyce Nixon.    09/01/2016 Pathology Results   Invasive adenocarcinoma, well-differentiated, spanning 1.3 cm. The tumor invaded into the anal sphincter muscle. Resection margins were negative. The pathologist notes the tumor would be best staged as pT2 given involvement of muscle.    09/06/2016 Imaging   CT scans chest/abdomen/pelvis 09/06/2016 showed no evidence of metastatic disease.    09/22/2016 Imaging   Pelvic MRI 09/22/2016 showed no definite residual anal tumor. There was no evidence of tumor involvement of the external sphincter or adjacent organs. There was no pelvic lymphadenopathy.   10/10/2016 - 11/22/2016 Radiation Therapy   Patient begun adjuvant radiation with Dr. Jeryl Moris   10/10/2016 - 11/22/2016 Chemotherapy   xeloda  1500mg , twice daily.   11/24/2017 Imaging   CT CAP W contrast 11/24/17  IMPRESSION: 1. Stable exam. No new or progressive interval findings to suggest metastatic disease. 2.  Aortic Atherosclerois (ICD10-170.0) 3.  Emphysema. ([id number])       Discussed the use of AI scribe software for clinical note transcription with the patient, who gave verbal consent to proceed.  History of Present Illness Juan David is a 68 year old male with hemochromatosis who presents for follow-up.  Ferritin levels are fluctuating despite adherence to the medication regimen. He is currently taking three pills a day but is running low on medication due to a delay in receiving his prescription refill. Diarrhea occurs two to three times a day, particularly after meals, as a side effect of the medication. He is concerned about using Imodium due to previous surgery and the need to maintain regular bowel movements. He has experienced a loss of appetite and has lost  five pounds. Protein shakes exacerbate diarrhea, making it difficult to maintain weight. He has increased water  intake to compensate for fluid loss. He smokes one to one and a half small cigars a day and does not feel heavily addicted.     All other systems were reviewed with the patient and are negative.  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 [place], as defined in [place]     per pt only see light   PFO (patent foramen ovale)    per TEE done 05-11-2009    Rectal adenocarcinoma (HCC) 09/28/2016   Traumatic glaucoma, 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 Antonietta Kitty at [facility] in [place]   08-18-2001   Wears glasses     SURGICAL HISTORY: Past Surgical History:  Procedure Laterality Date   ARTHROSCOPIC REPAIR ACL Right 1998   BIOPSY  03/16/2021   Procedure: BIOPSY;  Surgeon: Kenney Peacemaker, MD;  Location: [facility];  Service: Endoscopy;;   ESOPHAGOGASTRODUODENOSCOPY (EGD) WITH PROPOFOL  N/A 03/16/2021  Procedure: ESOPHAGOGASTRODUODENOSCOPY (EGD) WITH PROPOFOL ;  Surgeon: Kenney Peacemaker, MD;  [facility]: [facility];  Service: Endoscopy;  Laterality: N/A;   EXPLORATION AND REPAIR LEFT EYE INJURY  08-18-2001   [place]   ruptured globe- repair corneal laceration, reposition of prolapsed uveal tissue   HEMORRHOID SURGERY N/A 09/01/2016   Procedure: HEMORRHOIDECTOMY;  Surgeon: Joyce Nixon, MD;  Location: [facility];  Service: General;  Laterality: N/A;   LAPAROTOMY N/A 12/04/2020   Procedure: EXPLORATORY LAPAROTOMY small bowel resection;  Surgeon:  Lujean Sake, MD;  Location: [facility];  Service: General;  Laterality: N/A;   SUPERFICIAL KERATECTOMY Left 06-29-2011    [facility]    with EDTA scrub of left eye   TEE WITHOUT CARDIOVERSION  05-11-2009  dr Ola Berger   LVSEF 55-65%/  mild thickened AV without AI/  trace MR/ mixed fixed artherosclerosis plaqueing thoracic aorta/  no evidence thrombus/  by agitated saling and color doppler there was a PFO    I have reviewed the social history and family history with the patient and they are unchanged from previous note.  ALLERGIES:  has no known allergies.  MEDICATIONS:  Current Outpatient Medications  Medication Sig Dispense Refill   acetaminophen  (TYLENOL ) 500 MG tablet Take 2 tablets (1,000 mg total) by mouth every 6 (six) hours as needed. 30 tablet 0   Deferasirox  180 MG TABS Take 4 tablets (720 mg total) by mouth daily. Increase tablets up to goal dose as instructed by MD. Take on an empty stomach at least 30 minutes before a meal. 120 tablet 2   diclofenac  (VOLTAREN ) 75 MG EC tablet Take 1 tablet (75 mg total) by mouth 2 (two) times daily with meals. (Start on Day 7) 60 tablet 0   erythromycin  ophthalmic ointment Apply 0.5 inches to left eye at bedtime for 10 days. 3.5 g 2   hydrochlorothiazide  (HYDRODIURIL ) 25 MG tablet Take 1 tablet (25 mg total) by mouth daily for hypertension. 90 tablet 1   hydrochlorothiazide  (HYDRODIURIL ) 50 MG tablet Take 1/2 tablet (25 mg total) by mouth daily. 90 tablet 0   moxifloxacin  (VIGAMOX ) 0.5 % ophthalmic solution Place 1 drop into the left eye 4 (four) times daily for 10 days. 5 mL 5   Multiple Vitamin (MULTIVITAMIN WITH MINERALS) TABS tablet Take 1 tablet by mouth in the morning.     ondansetron  (ZOFRAN ) 8 MG tablet Take 1 tablet (8 mg total) by mouth every 8 (eight) hours as needed for nausea or vomiting. 10 tablet 0   polyethylene glycol (MIRALAX / GLYCOLAX) 17 g packet Take 17 g by mouth in the morning.     predniSONE  (STERAPRED UNI-PAK 21 TAB) 10 MG  (21) TBPK tablet Take as directed on package by mouth with meals. 21 tablet 0   timolol  (TIMOPTIC ) 0.5 % ophthalmic solution Place 1 drop into the left eye daily. 10 mL 11   timolol  (TIMOPTIC ) 0.5 % ophthalmic solution Place 1 drop into the left eye every morning. 10 mL 11   No current facility-administered medications for this visit.    PHYSICAL EXAMINATION: ECOG PERFORMANCE STATUS: 1 - Symptomatic but completely ambulatory  Vitals:   08/10/23 1523  BP: 138/78  Pulse: 76  Resp: 17  Temp: (!) 97.2 F (36.2 C)  SpO2: 97%   Wt Readings from Last 3 Encounters:  08/10/23 128 lb 8 oz (58.3 kg)  07/20/23 126 lb 6.4 oz (57.3 kg)  06/22/23 132 lb 11.2 oz (60.2 kg)  GENERAL:alert, no distress and comfortable SKIN: skin color, texture, turgor are normal, no rashes or significant lesions EYES: normal, Conjunctiva are pink and non-injected, sclera clear Musculoskeletal:no cyanosis of digits and no clubbing  NEURO: alert & oriented x 3 with fluent speech, no focal motor/sensory deficits  Physical Exam    LABORATORY DATA:  I have reviewed the data as listed    Latest Ref Rng & Units 08/09/2023    8:56 AM 08/03/2023    9:12 AM 07/19/2023   10:55 AM  CBC  WBC 4.0 - 10.5 K/uL 6.1  6.1  5.6   Hemoglobin 13.0 - 17.0 g/dL 16.1  09.6  04.5   Hematocrit 39.0 - 52.0 % 39.0  38.6  39.0   Platelets 150 - 400 K/uL 258  220  260         Latest Ref Rng & Units 08/03/2023    9:12 AM 08/23/2022   12:26 PM 03/30/2022    3:00 PM  CMP  Glucose 70 - 99 mg/dL 98  409  89   BUN 8 - 23 mg/dL 20  15  15    Creatinine 0.61 - 1.24 mg/dL 8.11  9.14  7.82   Sodium 135 - 145 mmol/L 140  137  138   Potassium 3.5 - 5.1 mmol/L 4.4  3.6  3.6   Chloride 98 - 111 mmol/L 106  104  105   CO2 22 - 32 mmol/L 27  29  25    Calcium 8.9 - 10.3 mg/dL 9.8  9.3  8.8   Total Protein 6.5 - 8.1 g/dL 7.7  6.8  7.4   Total Bilirubin 0.0 - 1.2 mg/dL 0.4  0.5  0.4   Alkaline Phos 38 - 126 U/L 102  89  86   AST 15 - 41  U/L 19  16  39   ALT 0 - 44 U/L 14  11  28        RADIOGRAPHIC STUDIES: I have personally reviewed the radiological images as listed and agreed with the findings in the report. No results found.    Orders Placed This Encounter  Procedures   MR Liver Wo Contrast    Standing Status:   Future    Expected Date:   11/06/2023    Expiration Date:   08/09/2024    What is the patient's sedation requirement?:   No Sedation    Does the patient have a pacemaker or implanted devices?:   No    Preferred imaging location?:   [facility] (table limit - 550 lbs)   All questions were answered. The patient knows to call the clinic with any problems, questions or concerns. No barriers to learning was detected. The total time spent in the appointment was 25 minutes, including review of chart and various tests results, discussions about plan of care and coordination of care plan     Sonja Camino, MD 08/10/2023

## 2023-08-12 ENCOUNTER — Other Ambulatory Visit (HOSPITAL_COMMUNITY): Payer: Self-pay

## 2023-08-28 ENCOUNTER — Other Ambulatory Visit: Payer: Commercial Managed Care - PPO

## 2023-08-28 ENCOUNTER — Other Ambulatory Visit: Payer: Self-pay

## 2023-08-30 ENCOUNTER — Other Ambulatory Visit: Payer: Self-pay

## 2023-08-31 ENCOUNTER — Ambulatory Visit: Payer: Commercial Managed Care - PPO | Admitting: Hematology

## 2023-09-01 ENCOUNTER — Other Ambulatory Visit: Payer: Self-pay

## 2023-09-02 DIAGNOSIS — M1712 Unilateral primary osteoarthritis, left knee: Secondary | ICD-10-CM | POA: Diagnosis not present

## 2023-09-05 ENCOUNTER — Other Ambulatory Visit (HOSPITAL_COMMUNITY): Payer: Self-pay

## 2023-09-12 ENCOUNTER — Other Ambulatory Visit: Payer: Self-pay

## 2023-09-12 ENCOUNTER — Inpatient Hospital Stay: Attending: Hematology

## 2023-09-12 DIAGNOSIS — Z85048 Personal history of other malignant neoplasm of rectum, rectosigmoid junction, and anus: Secondary | ICD-10-CM | POA: Diagnosis not present

## 2023-09-12 DIAGNOSIS — C20 Malignant neoplasm of rectum: Secondary | ICD-10-CM

## 2023-09-12 LAB — CBC WITH DIFFERENTIAL (CANCER CENTER ONLY)
Abs Immature Granulocytes: 0.01 K/uL (ref 0.00–0.07)
Basophils Absolute: 0 K/uL (ref 0.0–0.1)
Basophils Relative: 0 %
Eosinophils Absolute: 0.1 K/uL (ref 0.0–0.5)
Eosinophils Relative: 2 %
HCT: 39.3 % (ref 39.0–52.0)
Hemoglobin: 13.6 g/dL (ref 13.0–17.0)
Immature Granulocytes: 0 %
Lymphocytes Relative: 26 %
Lymphs Abs: 1.3 K/uL (ref 0.7–4.0)
MCH: 28.2 pg (ref 26.0–34.0)
MCHC: 34.6 g/dL (ref 30.0–36.0)
MCV: 81.5 fL (ref 80.0–100.0)
Monocytes Absolute: 1 K/uL (ref 0.1–1.0)
Monocytes Relative: 18 %
Neutro Abs: 2.8 K/uL (ref 1.7–7.7)
Neutrophils Relative %: 54 %
Platelet Count: 196 K/uL (ref 150–400)
RBC: 4.82 MIL/uL (ref 4.22–5.81)
RDW: 13.9 % (ref 11.5–15.5)
WBC Count: 5.2 K/uL (ref 4.0–10.5)
nRBC: 0 % (ref 0.0–0.2)

## 2023-09-12 LAB — FERRITIN: Ferritin: 2786 ng/mL — ABNORMAL HIGH (ref 24–336)

## 2023-09-12 NOTE — Progress Notes (Signed)
 Specialty Pharmacy Refill Coordination Note  Juan David is a 68 y.o. male contacted today regarding refills of specialty medication(s) Deferasirox    Patient requested [name] at [facility] at [place] date: 09/12/23   Medication will be filled on 09/12/23.

## 2023-09-14 ENCOUNTER — Ambulatory Visit

## 2023-09-14 ENCOUNTER — Other Ambulatory Visit

## 2023-09-14 ENCOUNTER — Encounter

## 2023-09-14 DIAGNOSIS — Z85048 Personal history of other malignant neoplasm of rectum, rectosigmoid junction, and anus: Secondary | ICD-10-CM | POA: Diagnosis not present

## 2023-09-14 MED ORDER — SODIUM CHLORIDE 0.9 % IV SOLN: Freq: Once | INTRAVENOUS | Status: AC

## 2023-09-14 NOTE — Progress Notes (Signed)
 [name] presents today for phlebotomy per MD orders. Phlebotomy procedure started at 1533 and ended at 1538. 260 grams removed. Patient observed for 60 minutes after procedure without any incident. IVF 500cc over 1 hour as ordered in treatmetn plan. Patient tolerated procedure well. IV needle removed intact.

## 2023-09-20 ENCOUNTER — Other Ambulatory Visit (HOSPITAL_COMMUNITY): Payer: Self-pay

## 2023-09-20 ENCOUNTER — Other Ambulatory Visit: Payer: Self-pay

## 2023-10-03 ENCOUNTER — Other Ambulatory Visit (HOSPITAL_COMMUNITY): Payer: Self-pay

## 2023-10-05 ENCOUNTER — Other Ambulatory Visit (HOSPITAL_COMMUNITY): Payer: Self-pay

## 2023-10-05 ENCOUNTER — Other Ambulatory Visit: Payer: Self-pay

## 2023-10-09 ENCOUNTER — Other Ambulatory Visit: Payer: Self-pay

## 2023-10-10 ENCOUNTER — Other Ambulatory Visit: Payer: Self-pay

## 2023-10-10 NOTE — Progress Notes (Signed)
 Specialty Pharmacy Ongoing Clinical Assessment Note  KRISTA SOM is a 68 y.o. male who is being followed by the specialty pharmacy service for RxSp Oncology   Patient's specialty medication(s) reviewed today: Deferasirox    Missed doses in the last 4 weeks: 0   Patient/Caregiver did not have any additional questions or concerns.   Therapeutic benefit summary: Patient is NOT achieving benefit   Adverse events/side effects summary: Experienced adverse events/side effects (diarrhea, pt described as being extreme. Unable to take imodium or fiber as this causes constipation. Pt has increased fluid/electolyte intake. Dr. Lanny aware of this.)   Patient's therapy is appropriate to: Continue    Goals Addressed             This Visit's Progress    Stabilization of disease   No change    Patient is not on track and no change. Patient will maintain adherence and be monitored by provider to determine if a change in treatment plan is warranted. Per visit on 08/10/23, patient was increased to 4 tablets daily, but ferritin has increased as of 7/8 to 2,665.          Follow up: 3 months  [facility]

## 2023-10-10 NOTE — Progress Notes (Signed)
 Specialty Pharmacy Refill Coordination Note  Juan David is a 68 y.o. male contacted today regarding refills of specialty medication(s) Deferasirox    Patient requested [name] at [facility] at [place] date: 10/11/23   Medication will be filled on 10/10/23.

## 2023-11-03 ENCOUNTER — Other Ambulatory Visit: Payer: Self-pay

## 2023-11-03 ENCOUNTER — Other Ambulatory Visit: Payer: Self-pay | Admitting: Hematology

## 2023-11-03 ENCOUNTER — Other Ambulatory Visit (HOSPITAL_COMMUNITY): Payer: Self-pay

## 2023-11-03 DIAGNOSIS — R7989 Other specified abnormal findings of blood chemistry: Secondary | ICD-10-CM

## 2023-11-03 MED ORDER — DEFERASIROX 180 MG PO TABS
720.0000 mg | ORAL_TABLET | Freq: Every day | ORAL | 2 refills | Status: DC
  Filled 2023-11-03 – 2023-11-16 (×3): qty 120, 30d supply, fill #0
  Filled 2023-12-12: qty 120, 30d supply, fill #1
  Filled 2024-01-05 – 2024-01-11 (×2): qty 120, 30d supply, fill #2

## 2023-11-07 ENCOUNTER — Other Ambulatory Visit (HOSPITAL_COMMUNITY): Payer: Self-pay

## 2023-11-07 ENCOUNTER — Inpatient Hospital Stay: Attending: Hematology

## 2023-11-07 ENCOUNTER — Other Ambulatory Visit: Payer: Self-pay

## 2023-11-07 ENCOUNTER — Ambulatory Visit (HOSPITAL_COMMUNITY)
Admission: RE | Admit: 2023-11-07 | Discharge: 2023-11-07 | Disposition: A | Discharge status: Home / Self Care | Source: Ambulatory Visit | Attending: Hematology | Admitting: Hematology

## 2023-11-07 DIAGNOSIS — R918 Other nonspecific abnormal finding of lung field: Secondary | ICD-10-CM | POA: Diagnosis not present

## 2023-11-07 DIAGNOSIS — Z923 Personal history of irradiation: Secondary | ICD-10-CM | POA: Insufficient documentation

## 2023-11-07 DIAGNOSIS — R634 Abnormal weight loss: Secondary | ICD-10-CM | POA: Diagnosis not present

## 2023-11-07 DIAGNOSIS — Z79899 Other long term (current) drug therapy: Secondary | ICD-10-CM | POA: Diagnosis not present

## 2023-11-07 DIAGNOSIS — Z85048 Personal history of other malignant neoplasm of rectum, rectosigmoid junction, and anus: Secondary | ICD-10-CM | POA: Insufficient documentation

## 2023-11-07 DIAGNOSIS — Z86718 Personal history of other venous thrombosis and embolism: Secondary | ICD-10-CM | POA: Diagnosis not present

## 2023-11-07 DIAGNOSIS — K56609 Unspecified intestinal obstruction, unspecified as to partial versus complete obstruction: Secondary | ICD-10-CM | POA: Insufficient documentation

## 2023-11-07 DIAGNOSIS — F17290 Nicotine dependence, other tobacco product, uncomplicated: Secondary | ICD-10-CM | POA: Diagnosis not present

## 2023-11-07 DIAGNOSIS — J439 Emphysema, unspecified: Secondary | ICD-10-CM | POA: Insufficient documentation

## 2023-11-07 DIAGNOSIS — K529 Noninfective gastroenteritis and colitis, unspecified: Secondary | ICD-10-CM | POA: Insufficient documentation

## 2023-11-07 DIAGNOSIS — Z9049 Acquired absence of other specified parts of digestive tract: Secondary | ICD-10-CM | POA: Insufficient documentation

## 2023-11-07 DIAGNOSIS — D649 Anemia, unspecified: Secondary | ICD-10-CM | POA: Diagnosis not present

## 2023-11-07 DIAGNOSIS — R911 Solitary pulmonary nodule: Secondary | ICD-10-CM | POA: Insufficient documentation

## 2023-11-07 DIAGNOSIS — K7689 Other specified diseases of liver: Secondary | ICD-10-CM | POA: Diagnosis not present

## 2023-11-07 DIAGNOSIS — R7989 Other specified abnormal findings of blood chemistry: Secondary | ICD-10-CM | POA: Diagnosis not present

## 2023-11-07 DIAGNOSIS — R630 Anorexia: Secondary | ICD-10-CM | POA: Insufficient documentation

## 2023-11-07 DIAGNOSIS — H548 Legal blindness, as defined in USA: Secondary | ICD-10-CM | POA: Insufficient documentation

## 2023-11-07 DIAGNOSIS — I82290 Acute embolism and thrombosis of other thoracic veins: Secondary | ICD-10-CM | POA: Diagnosis not present

## 2023-11-07 DIAGNOSIS — C20 Malignant neoplasm of rectum: Secondary | ICD-10-CM

## 2023-11-07 LAB — CBC WITH DIFFERENTIAL (CANCER CENTER ONLY)
Abs Immature Granulocytes: 0.02 K/uL (ref 0.00–0.07)
Basophils Absolute: 0 K/uL (ref 0.0–0.1)
Basophils Relative: 0 %
Eosinophils Absolute: 0.2 K/uL (ref 0.0–0.5)
Eosinophils Relative: 2 %
HCT: 38.1 % — ABNORMAL LOW (ref 39.0–52.0)
Hemoglobin: 12.6 g/dL — ABNORMAL LOW (ref 13.0–17.0)
Immature Granulocytes: 0 %
Lymphocytes Relative: 21 %
Lymphs Abs: 1.9 K/uL (ref 0.7–4.0)
MCH: 27.8 pg (ref 26.0–34.0)
MCHC: 33.1 g/dL (ref 30.0–36.0)
MCV: 84.1 fL (ref 80.0–100.0)
Monocytes Absolute: 0.8 K/uL (ref 0.1–1.0)
Monocytes Relative: 8 %
Neutro Abs: 6.2 K/uL (ref 1.7–7.7)
Neutrophils Relative %: 69 %
Platelet Count: 278 K/uL (ref 150–400)
RBC: 4.53 MIL/uL (ref 4.22–5.81)
RDW: 15 % (ref 11.5–15.5)
WBC Count: 9.1 K/uL (ref 4.0–10.5)
nRBC: 0 % (ref 0.0–0.2)

## 2023-11-07 LAB — IRON AND IRON BINDING CAPACITY (CC-WL,HP ONLY)
Iron: 42 ug/dL — ABNORMAL LOW (ref 45–182)
Saturation Ratios: 16 % — ABNORMAL LOW (ref 17.9–39.5)
TIBC: 272 ug/dL (ref 250–450)
UIBC: 230 ug/dL (ref 117–376)

## 2023-11-07 LAB — FERRITIN: Ferritin: 2102 ng/mL — ABNORMAL HIGH (ref 24–336)

## 2023-11-07 MED ORDER — GADOBUTROL 1 MMOL/ML IV SOLN
5.0000 mL | Freq: Once | INTRAVENOUS | Status: AC | PRN
  Administered 2023-11-07: 5 mL via INTRAVENOUS

## 2023-11-09 ENCOUNTER — Other Ambulatory Visit (HOSPITAL_COMMUNITY): Payer: Self-pay

## 2023-11-15 ENCOUNTER — Other Ambulatory Visit: Payer: Self-pay

## 2023-11-15 ENCOUNTER — Inpatient Hospital Stay (HOSPITAL_BASED_OUTPATIENT_CLINIC_OR_DEPARTMENT_OTHER): Admitting: Hematology

## 2023-11-15 VITALS — BP 134/84 | HR 70 | Temp 97.9°F | Resp 17 | Wt 119.0 lb

## 2023-11-15 DIAGNOSIS — K56609 Unspecified intestinal obstruction, unspecified as to partial versus complete obstruction: Secondary | ICD-10-CM | POA: Diagnosis not present

## 2023-11-15 DIAGNOSIS — Z85048 Personal history of other malignant neoplasm of rectum, rectosigmoid junction, and anus: Secondary | ICD-10-CM | POA: Diagnosis not present

## 2023-11-15 DIAGNOSIS — C20 Malignant neoplasm of rectum: Secondary | ICD-10-CM

## 2023-11-15 DIAGNOSIS — H548 Legal blindness, as defined in USA: Secondary | ICD-10-CM | POA: Diagnosis not present

## 2023-11-15 DIAGNOSIS — K529 Noninfective gastroenteritis and colitis, unspecified: Secondary | ICD-10-CM | POA: Diagnosis not present

## 2023-11-15 DIAGNOSIS — R7989 Other specified abnormal findings of blood chemistry: Secondary | ICD-10-CM | POA: Diagnosis not present

## 2023-11-15 DIAGNOSIS — D649 Anemia, unspecified: Secondary | ICD-10-CM | POA: Diagnosis not present

## 2023-11-15 DIAGNOSIS — R634 Abnormal weight loss: Secondary | ICD-10-CM | POA: Diagnosis not present

## 2023-11-15 DIAGNOSIS — J439 Emphysema, unspecified: Secondary | ICD-10-CM | POA: Diagnosis not present

## 2023-11-15 DIAGNOSIS — R911 Solitary pulmonary nodule: Secondary | ICD-10-CM | POA: Diagnosis not present

## 2023-11-15 DIAGNOSIS — R630 Anorexia: Secondary | ICD-10-CM | POA: Diagnosis not present

## 2023-11-15 NOTE — Assessment & Plan Note (Signed)
 He was referred for high ferritin level, and liver MRI confirmed iron overload -HFE  genetic test was negative  --He is on phlebotomy, he had a severe vasovagal after the first phlebotomy -I have changed his phlebotomy to half unit with IVF for future phlebotomy, he tolerated much better  -He is serum iron and saturation has been normal, however he has persistent elevated ferritin, did not change much since he started phlebotomy.  He is elevated ferritin is likely related to his chronic inflammation. -He started deferasirox  180mg  in April 2025, will titrate dose per tolerance and monitor iron levels

## 2023-11-15 NOTE — Progress Notes (Signed)
 [facility]   Telephone:([phone]) [phone] [phone]   Clinic Follow up Note   Patient Care Team: [name], MD as PCP - General (Hematology) [name], MD as Consulting Physician (General Surgery) [name], MD as Consulting Physician (Radiation Oncology) [name], MD as Consulting Physician (Hematology) [name], MD as Consulting Physician (Gastroenterology) [name], MD as Consulting Physician (General Surgery)  Date of Service:  11/15/2023  CHIEF COMPLAINT: f/u of hemochromatosis  CURRENT THERAPY:  Deferasirox  720 mg daily  Oncology History   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 Debby, surgical path reviewed a 1.3 cm adenocarcinoma with invasion into anal sphincter muscle.  -Pelvic MRI 09/22/16 showed no residual tumor and no adenopathy.  -Patient completed adjuvant concurrent chemo and radiation 10/10/16-11/22/16, tolerated well overall. -repeated colonoscopy in 03/2021 showed Stricture in the distal sigmoid colon, scope was not able to pass, not malignant appearing, not biopsied -He is clinically doing well, lab reviewed, exam was unremarkable, there is no clinical concern for recurrence. -He has completed 5 years of surveillance.   iron overload He was referred for high ferritin level, and liver MRI confirmed iron overload -HFE  genetic test was negative  --He is on phlebotomy, he had a severe vasovagal after the first phlebotomy -I have changed his phlebotomy to half unit with IVF for future phlebotomy, he tolerated much better  -He is serum iron and saturation has been normal, however he has persistent elevated ferritin, did not change much since he started phlebotomy.  He is elevated ferritin is likely related to his chronic inflammation. -He started deferasirox  180mg  in April 2025, will titrate dose per tolerance and monitor iron levels    Assessment & Plan Iron overload Iron  overload with ongoing chelation therapy. Ferritin levels fluctuate and have not significantly decreased with phlebotomy or medication. Iron levels are lower than previous measurements. Mild anemia present but not concerning. Current chelation therapy is at four tablets per day; higher doses cause significant nausea and reduced appetite. - Continue current chelation therapy at four tablets per day. - Monitor ferritin and iron levels. - Consider increasing chelation dose if tolerated, up to ten tablets per day based on weight. - Encourage dietary adjustments to manage side effects, including low fiber diet and multivitamin supplementation. - Consider probiotics to manage gastrointestinal side effects.  Chronic diarrhea secondary to iron chelation therapy Chronic diarrhea likely secondary to iron chelation therapy, contributing to weight loss. Imodium is contraindicated due to history of bowel obstruction. - Recommend low fiber diet to reduce diarrhea. - Supplement with multivitamins if on low fiber diet. - Consider probiotics to manage diarrhea. - Avoid Imodium due to contraindication with bowel obstruction history.  Abnormal weight loss Weight loss to 118 pounds, likely multifactorial including chronic diarrhea from chelation therapy and reduced appetite from higher chelation doses. He is eating but may need nutritional supplementation. - Encourage nutritional supplementation, monitoring for side effects like diarrhea. - Monitor weight and nutritional status.  Pulmonary nodule, left posterior lung, 6 mm Incidental finding of a 6 mm nodule in the left posterior lung on abdominal scan. Last CT chest was in 2019 with no nodules noted. Given history of cancer and smoking, further evaluation is warranted. Nodule is likely benign but requires monitoring due to cancer history. - Order CT chest in 2-3 weeks to evaluate the nodule. - If nodule appears benign, consider repeat CT in three months. - If  nodule appears suspicious,  consider biopsy. - Release scan results to him via MyChart.  Malignant neoplasm of rectum, remote, surveillance status Rectal cancer in 2018, currently in surveillance status. No signs of recurrence noted on recent scans. Last colonoscopy in 2023, with recommendations for follow-up unclear between two and five years. - Contact GI specialist to clarify colonoscopy follow-up schedule.  Plan - I reviewed his recent liver MRI images, and discussed the finding with patient and his wife.  Per radiologist comparison to previous MRI, his iron load in the liver has slightly decreased. - Continue deferasirox , he will try to increase the dose to 5 tablets daily if he can tolerate. - CT chest without contrast in the next few weeks, call him with results - Lab and follow-up in 3 months    SUMMARY OF ONCOLOGIC HISTORY: Oncology History Overview Note  Cancer Staging Rectal adenocarcinoma [facility]) Staging form: Colon and Rectum, AJCC 8th Edition - Pathologic stage from 09/01/2016: Stage I (pT2, pN0, cM0) - Signed by [name], MD on 10/01/2016     Adenocarcinoma of rectum (HCC)  09/01/2016 Initial Diagnosis   Rectal adenocarcinoma (HCC)   09/01/2016 Surgery   Hemorrhoidectomy 09/01/2016. Noted to have a mobile right lateral anal canal mass. Dr. Bernarda Ned.    09/01/2016 Pathology Results   Invasive adenocarcinoma, well-differentiated, spanning 1.3 cm. The tumor invaded into the anal sphincter muscle. Resection margins were negative. The pathologist notes the tumor would be best staged as pT2 given involvement of muscle.    09/06/2016 Imaging   CT scans chest/abdomen/pelvis 09/06/2016 showed no evidence of metastatic disease.    09/22/2016 Imaging   Pelvic MRI 09/22/2016 showed no definite residual anal tumor. There was no evidence of tumor involvement of the external sphincter or adjacent organs. There was no pelvic lymphadenopathy.   10/10/2016 - 11/22/2016 Radiation Therapy    Patient begun adjuvant radiation with Dr. Dewey   10/10/2016 - 11/22/2016 Chemotherapy   xeloda  1500mg , twice daily.   11/24/2017 Imaging   CT CAP W contrast 11/24/17  IMPRESSION: 1. Stable exam. No new or progressive interval findings to suggest metastatic disease. 2.  Aortic Atherosclerois (ICD10-170.0) 3.  Emphysema. (PRI89-G56.9)       Discussed the use of AI scribe software for clinical note transcription with the patient, who gave verbal consent to proceed.  History of Present Illness Juan David is a 68 year old male with iron overload who presents for follow-up.  Iron overload persists with fluctuating ferritin levels that have not significantly decreased despite phlebotomy and medication. He takes four tablets of oral medication daily, reduced from five due to nausea and loss of appetite.  A history of rectal cancer treated in 2018 is noted, with a recent abdominal scan revealing a 6 mm nodule in the left posterior lung. His last CT chest in 2019 showed no nodules. He smokes about one and a half small cigars per day.  He reports weight loss, currently at 118 pounds, partly attributed to diarrhea occurring two to three times daily. Diarrhea began after starting his current medication, and he follows a low fiber diet to manage it. No current nausea.     All other systems were reviewed with the patient and are negative.  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   Hypertension    Iron deficiency    Legally blind in left eye, as defined in [place]     per pt only see light   PFO (patent foramen ovale)    per TEE  done 05-11-2009    Rectal adenocarcinoma (HCC) 09/28/2016   Traumatic glaucoma, 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 at [facility] in [place]   08-18-2001   Wears glasses     SURGICAL HISTORY: Past Surgical History:  Procedure Laterality Date   ARTHROSCOPIC REPAIR ACL Right 1998   BIOPSY  03/16/2021   Procedure: BIOPSY;  Surgeon: [name], MD;  Location: [facility];  Service: Endoscopy;;   ESOPHAGOGASTRODUODENOSCOPY (EGD) WITH PROPOFOL  N/A 03/16/2021   Procedure: ESOPHAGOGASTRODUODENOSCOPY (EGD) WITH PROPOFOL ;  Surgeon: [name], MD;  Location: [facility];  Service: Endoscopy;  Laterality: N/A;   EXPLORATION AND REPAIR LEFT EYE INJURY  08-18-2001   [place]   ruptured globe- repair corneal laceration, reposition of prolapsed uveal tissue   HEMORRHOID SURGERY N/A 09/01/2016   Procedure: HEMORRHOIDECTOMY;  Surgeon: [name], MD;  Location: [facility];  Service: General;  Laterality: N/A;   LAPAROTOMY N/A 12/04/2020   Procedure: EXPLORATORY LAPAROTOMY small bowel resection;  Surgeon: [name], MD;  Location: [facility] OR;  Service: General;  Laterality: N/A;   SUPERFICIAL KERATECTOMY Left 06-29-2011    [facility]    with EDTA scrub of left eye   TEE WITHOUT CARDIOVERSION  05-11-2009  dr vina gull   LVSEF 55-65%/  mild thickened AV without AI/  trace MR/ mixed fixed artherosclerosis plaqueing thoracic aorta/  no evidence thrombus/  by agitated saling and color doppler there was a PFO    I have reviewed the social history and family history with the patient and they are unchanged from previous note.  ALLERGIES:  has no known allergies.  MEDICATIONS:  Current Outpatient Medications  Medication Sig Dispense Refill   acetaminophen  (TYLENOL ) 500 MG tablet Take 2 tablets (1,000 mg total) by mouth every 6 (six) hours as needed. 30 tablet 0   Deferasirox  180 MG TABS Take 4 tablets (720 mg total) by mouth daily. Increase tablets up to goal  dose as instructed by MD. Take on an empty stomach at least 30 minutes before a meal. 120 tablet 2   diclofenac  (VOLTAREN ) 75 MG EC tablet Take 1 tablet (75 mg total) by mouth 2 (two) times daily with meals. (Start on Day 7) 60 tablet 0   erythromycin  ophthalmic ointment Apply 0.5 inches to left eye at bedtime for 10 days. 3.5 g 2   hydrochlorothiazide  (HYDRODIURIL ) 25 MG tablet Take 1 tablet (25 mg total) by mouth daily for hypertension. 90 tablet 1   hydrochlorothiazide  (HYDRODIURIL ) 50 MG tablet Take 1/2 tablet (25 mg total) by mouth daily. 90 tablet 0   moxifloxacin  (VIGAMOX ) 0.5 % ophthalmic solution Place 1 drop into the left eye 4 (four) times daily for 10 days. 5 mL 5   Multiple Vitamin (MULTIVITAMIN WITH MINERALS) TABS tablet Take 1 tablet by mouth in the morning.     ondansetron  (ZOFRAN ) 8 MG tablet Take 1 tablet (8 mg total) by mouth every 8 (eight) hours as needed for nausea or vomiting. 10 tablet 0   polyethylene  glycol (MIRALAX / GLYCOLAX) 17 g packet Take 17 g by mouth in the morning.     predniSONE  (STERAPRED UNI-PAK 21 TAB) 10 MG (21) TBPK tablet Take as directed on package by mouth with meals. 21 tablet 0   timolol  (TIMOPTIC ) 0.5 % ophthalmic solution Place 1 drop into the left eye daily. 10 mL 11   timolol  (TIMOPTIC ) 0.5 % ophthalmic solution Place 1 drop into the left eye every morning. 10 mL 11   No current facility-administered medications for this visit.    PHYSICAL EXAMINATION: ECOG PERFORMANCE STATUS: 1 - Symptomatic but completely ambulatory  Vitals:   11/15/23 1315  BP: 134/84  Pulse: 70  Resp: 17  Temp: 97.9 F (36.6 C)  SpO2: 94%   Wt Readings from Last 3 Encounters:  11/15/23 119 lb (54 kg)  08/10/23 128 lb 8 oz (58.3 kg)  07/20/23 126 lb 6.4 oz (57.3 kg)     GENERAL:alert, no distress and comfortable SKIN: skin color, texture, turgor are normal, no rashes or significant lesions EYES: normal, Conjunctiva are pink and non-injected, sclera  clear Musculoskeletal:no cyanosis of digits and no clubbing  NEURO: alert & oriented x 3 with fluent speech, no focal motor/sensory deficits  Physical Exam   LABORATORY DATA:  I have reviewed the data as listed    Latest Ref Rng & Units 11/07/2023   12:14 PM 09/12/2023    9:02 AM 08/09/2023    8:56 AM  CBC  WBC 4.0 - 10.5 K/uL 9.1  5.2  6.1   Hemoglobin 13.0 - 17.0 g/dL 87.3  86.3  86.8   Hematocrit 39.0 - 52.0 % 38.1  39.3  39.0   Platelets 150 - 400 K/uL 278  196  258         Latest Ref Rng & Units 08/03/2023    9:12 AM 08/23/2022   12:26 PM 03/30/2022    3:00 PM  CMP  Glucose 70 - 99 mg/dL 98  873  89   BUN 8 - 23 mg/dL 20  15  15    Creatinine 0.61 - 1.24 mg/dL 8.93  9.05  8.90   Sodium 135 - 145 mmol/L 140  137  138   Potassium 3.5 - 5.1 mmol/L 4.4  3.6  3.6   Chloride 98 - 111 mmol/L 106  104  105   CO2 22 - 32 mmol/L 27  29  25    Calcium 8.9 - 10.3 mg/dL 9.8  9.3  8.8   Total Protein 6.5 - 8.1 g/dL 7.7  6.8  7.4   Total Bilirubin 0.0 - 1.2 mg/dL 0.4  0.5  0.4   Alkaline Phos 38 - 126 U/L 102  89  86   AST 15 - 41 U/L 19  16  39   ALT 0 - 44 U/L 14  11  28        RADIOGRAPHIC STUDIES: I have personally reviewed the radiological images as listed and agreed with the findings in the report. No results found.    Orders Placed This Encounter  Procedures   CT Chest Wo Contrast    Standing Status:   Future    Expected Date:   11/29/2023    Expiration Date:   11/14/2024    Preferred imaging location?:   [facility]    Release to patient:   Immediate [1]   All questions were answered. The patient knows to call the clinic with any problems, questions or concerns. No barriers to learning was  detected. The total time spent in the appointment was 25 minutes, including review of chart and various tests results, discussions about plan of care and coordination of care plan     Onita Mattock, MD 11/15/2023

## 2023-11-15 NOTE — Assessment & Plan Note (Signed)
-  diagnosed in 08/2016, pT2N0 --resection on 09/01/16 by Dr. Thomas, surgical path reviewed a 1.3 cm adenocarcinoma with invasion into anal sphincter muscle.  -Pelvic MRI 09/22/16 showed no residual tumor and no adenopathy.  -Patient completed adjuvant concurrent chemo and radiation 10/10/16-11/22/16, tolerated well overall. -repeated colonoscopy in 03/2021 showed Stricture in the distal sigmoid colon, scope was not able to pass, not malignant appearing, not biopsied -He is clinically doing well, lab reviewed, exam was unremarkable, there is no clinical concern for recurrence. -He has completed 5 years of surveillance.  

## 2023-11-16 ENCOUNTER — Other Ambulatory Visit: Payer: Self-pay

## 2023-11-16 ENCOUNTER — Telehealth: Payer: Self-pay | Admitting: Hematology

## 2023-11-16 NOTE — Telephone Encounter (Signed)
 Called patient and spoke  with pt and he is aware.

## 2023-11-17 ENCOUNTER — Other Ambulatory Visit: Payer: Self-pay

## 2023-11-17 ENCOUNTER — Other Ambulatory Visit (HOSPITAL_COMMUNITY): Payer: Self-pay

## 2023-11-17 NOTE — Progress Notes (Signed)
 Specialty Pharmacy Refill Coordination Note  Juan David is a 68 y.o. male contacted today regarding refills of specialty medication(s) Deferasirox    Patient requested [name] at [facility] at [place] date: 11/17/23   Medication will be filled on 11/16/23.

## 2023-11-29 ENCOUNTER — Ambulatory Visit (HOSPITAL_COMMUNITY)
Admission: RE | Admit: 2023-11-29 | Discharge: 2023-11-29 | Disposition: A | Discharge status: Home / Self Care | Source: Ambulatory Visit | Attending: Hematology | Admitting: Hematology

## 2023-11-29 DIAGNOSIS — Z85048 Personal history of other malignant neoplasm of rectum, rectosigmoid junction, and anus: Secondary | ICD-10-CM | POA: Diagnosis not present

## 2023-11-29 DIAGNOSIS — J432 Centrilobular emphysema: Secondary | ICD-10-CM | POA: Diagnosis not present

## 2023-11-29 DIAGNOSIS — C20 Malignant neoplasm of rectum: Secondary | ICD-10-CM | POA: Insufficient documentation

## 2023-11-29 DIAGNOSIS — R918 Other nonspecific abnormal finding of lung field: Secondary | ICD-10-CM | POA: Diagnosis not present

## 2023-12-05 ENCOUNTER — Other Ambulatory Visit: Payer: Self-pay

## 2023-12-05 ENCOUNTER — Inpatient Hospital Stay: Admitting: Hematology

## 2023-12-05 DIAGNOSIS — C20 Malignant neoplasm of rectum: Secondary | ICD-10-CM

## 2023-12-05 DIAGNOSIS — R634 Abnormal weight loss: Secondary | ICD-10-CM | POA: Diagnosis not present

## 2023-12-05 DIAGNOSIS — C3490 Malignant neoplasm of unspecified part of unspecified bronchus or lung: Secondary | ICD-10-CM | POA: Diagnosis not present

## 2023-12-05 DIAGNOSIS — R918 Other nonspecific abnormal finding of lung field: Secondary | ICD-10-CM | POA: Diagnosis not present

## 2023-12-05 DIAGNOSIS — R911 Solitary pulmonary nodule: Secondary | ICD-10-CM | POA: Diagnosis not present

## 2023-12-05 DIAGNOSIS — Z85048 Personal history of other malignant neoplasm of rectum, rectosigmoid junction, and anus: Secondary | ICD-10-CM | POA: Diagnosis not present

## 2023-12-05 DIAGNOSIS — J439 Emphysema, unspecified: Secondary | ICD-10-CM | POA: Diagnosis not present

## 2023-12-05 DIAGNOSIS — R7989 Other specified abnormal findings of blood chemistry: Secondary | ICD-10-CM | POA: Diagnosis not present

## 2023-12-05 DIAGNOSIS — K56609 Unspecified intestinal obstruction, unspecified as to partial versus complete obstruction: Secondary | ICD-10-CM | POA: Diagnosis not present

## 2023-12-05 DIAGNOSIS — D649 Anemia, unspecified: Secondary | ICD-10-CM | POA: Diagnosis not present

## 2023-12-05 DIAGNOSIS — K529 Noninfective gastroenteritis and colitis, unspecified: Secondary | ICD-10-CM | POA: Diagnosis not present

## 2023-12-05 DIAGNOSIS — H548 Legal blindness, as defined in USA: Secondary | ICD-10-CM | POA: Diagnosis not present

## 2023-12-05 DIAGNOSIS — R630 Anorexia: Secondary | ICD-10-CM | POA: Diagnosis not present

## 2023-12-05 NOTE — Assessment & Plan Note (Signed)
 He was referred for high ferritin level, and liver MRI confirmed iron overload -HFE  genetic test was negative  --He is on phlebotomy, he had a severe vasovagal after the first phlebotomy -I have changed his phlebotomy to half unit with IVF for future phlebotomy, he tolerated much better  -He is serum iron and saturation has been normal, however he has persistent elevated ferritin, did not change much since he started phlebotomy.  He is elevated ferritin is likely related to his chronic inflammation. -He started deferasirox  180mg  in April 2025, will titrate dose per tolerance and monitor iron levels

## 2023-12-05 NOTE — Progress Notes (Signed)
 [facility]   Telephone:([phone]) [phone] Fax:([phone]) [phone]   Clinic Follow up Note   Patient Care Team: [name], MD as PCP - General (Family Medicine) [name], MD as Consulting Physician (General Surgery) [name], MD as Consulting Physician (Radiation Oncology) [name], MD as Consulting Physician (Hematology) [name], MD as Consulting Physician (Gastroenterology) [name], MD as Consulting Physician (General Surgery) 12/05/2023  I connected with [name] on 12/05/23 at  8:00 AM EDT by telephone and verified that I am speaking with the correct person using two identifiers.   I discussed the limitations, risks, security and privacy concerns of performing an evaluation and management service by telephone and the availability of in person appointments. I also discussed with the patient that there may be a patient responsible charge related to this service. The patient expressed understanding and agreed to proceed.   Patient's location:  Home  Provider's location:  Office    CHIEF COMPLAINT: f/u CT results    CURRENT THERAPY: deferasirox    Oncology history Adenocarcinoma of rectum (HCC) -diagnosed in 08/2016, pT2N0 --resection on 09/01/16 by Dr. Debby, surgical path reviewed a 1.3 cm adenocarcinoma with invasion into anal sphincter muscle.  -Pelvic MRI 09/22/16 showed no residual tumor and no adenopathy.  -Patient completed adjuvant concurrent chemo and radiation 10/10/16-11/22/16, tolerated well overall. -repeated colonoscopy in 03/2021 showed Stricture in the distal sigmoid colon, scope was not able to pass, not malignant appearing, not biopsied -He is clinically doing well, lab reviewed, exam was unremarkable, there is no clinical concern for recurrence. -He has completed 5 years of surveillance.   iron overload He was referred for high ferritin level, and liver MRI confirmed iron overload -HFE  genetic test was negative   --He is on phlebotomy, he had a severe vasovagal after the first phlebotomy -I have changed his phlebotomy to half unit with IVF for future phlebotomy, he tolerated much better  -He is serum iron and saturation has been normal, however he has persistent elevated ferritin, did not change much since he started phlebotomy.  He is elevated ferritin is likely related to his chronic inflammation. -He started deferasirox  180mg  in April 2025, will titrate dose per tolerance and monitor iron levels     Assessment & Plan Left lung mass and multiple bilateral lung nodules suspicious for malignancy A new mass in the left lung measuring 3.9 x 2.8 x 2.7 cm with additional small nodules is present, concerning for lung cancer. The presence of multiple nodules raises concern for metastatic disease. - Refer to pulmonology for bronchoscopy and biopsy of the left lung mass. - Order PET scan for lung cancer staging. - Order brain MRI to assess for metastasis. - Ensure all imaging and procedures are completed within the next two to three weeks.  Tobacco use Current smoker, which is a risk factor for lung cancer.  Cough Intermittent cough reported, no associated chest discomfort.  Plan - I personally reviewed the CT chest images and discussed the findings with patient and his wife, unfortunately this is highly concerning for metastatic lung cancer - I will make a urgent referral to pulmonary for bronchoscopy and biopsy - I ordered the PET scan and brain MRI with and without contrast to be done in the next few weeks - I will see him back after the above workup.     SUMMARY OF ONCOLOGIC HISTORY: Oncology History Overview Note  Cancer Staging Rectal adenocarcinoma [facility]) Staging form: Colon and Rectum, AJCC  8th Edition - Pathologic stage from 09/01/2016: Stage I (pT2, pN0, cM0) - Signed by [name], MD on 10/01/2016     Adenocarcinoma of rectum (HCC)  09/01/2016 Initial Diagnosis   Rectal adenocarcinoma  (HCC)   09/01/2016 Surgery   Hemorrhoidectomy 09/01/2016. Noted to have a mobile right lateral anal canal mass. Dr. Bernarda Ned.    09/01/2016 Pathology Results   Invasive adenocarcinoma, well-differentiated, spanning 1.3 cm. The tumor invaded into the anal sphincter muscle. Resection margins were negative. The pathologist notes the tumor would be best staged as pT2 given involvement of muscle.    09/06/2016 Imaging   CT scans chest/abdomen/pelvis 09/06/2016 showed no evidence of metastatic disease.    09/22/2016 Imaging   Pelvic MRI 09/22/2016 showed no definite residual anal tumor. There was no evidence of tumor involvement of the external sphincter or adjacent organs. There was no pelvic lymphadenopathy.   10/10/2016 - 11/22/2016 Radiation Therapy   Patient begun adjuvant radiation with Dr. Dewey   10/10/2016 - 11/22/2016 Chemotherapy   xeloda  1500mg , twice daily.   11/24/2017 Imaging   CT CAP W contrast 11/24/17  IMPRESSION: 1. Stable exam. No new or progressive interval findings to suggest metastatic disease. 2.  Aortic Atherosclerois (ICD10-170.0) 3.  Emphysema. (PRI89-G56.9)      Discussed the use of AI scribe software for clinical note transcription with the patient, who gave verbal consent to proceed.  History of Present Illness Juan David is a 68 year old male who presents for discussion of CT test results showing a mass in the left lung. He is accompanied by his wife, Mont, who is on another phone line.  A recent CT scan of the chest shows a mass in the left lung measuring 3.9 x 2.8 x 2.7 cm, along with additional small nodules. This mass was not present on a CT scan from 2019. He has a periodic cough but no chest discomfort. He has a history of smoking. A CT of the abdomen and an MRI of the liver show no abnormalities in the liver.     REVIEW OF SYSTEMS:   Constitutional: Denies fevers, chills or abnormal weight loss Eyes: Denies blurriness of vision Ears, nose,  mouth, throat, and face: Denies mucositis or sore throat Respiratory: Denies cough, dyspnea or wheezes Cardiovascular: Denies palpitation, chest discomfort or lower extremity swelling Gastrointestinal:  Denies nausea, heartburn or change in bowel habits Skin: Denies abnormal skin rashes Lymphatics: Denies new lymphadenopathy or easy bruising Neurological:Denies numbness, tingling or new weaknesses Behavioral/Psych: Mood is stable, no new changes  All other systems were reviewed with the patient and are negative.  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 eye, as defined in [place]     per pt only see light   PFO (patent foramen ovale)    per TEE done 05-11-2009    Rectal adenocarcinoma (HCC) 09/28/2016   Traumatic glaucoma, 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 at [facility]  in [place]   08-18-2001   Wears glasses     SURGICAL HISTORY: Past Surgical History:  Procedure Laterality Date   ARTHROSCOPIC REPAIR ACL Right 1998   BIOPSY  03/16/2021   Procedure: BIOPSY;  Surgeon: [name], MD;  Location: [facility];  Service: Endoscopy;;   ESOPHAGOGASTRODUODENOSCOPY (EGD) WITH PROPOFOL  N/A 03/16/2021   Procedure: ESOPHAGOGASTRODUODENOSCOPY (EGD) WITH PROPOFOL ;  Surgeon: [name], MD;  Location: [facility];  Service: Endoscopy;  Laterality: N/A;   EXPLORATION AND REPAIR LEFT EYE INJURY  08-18-2001   [place]   ruptured globe- repair corneal laceration, reposition of prolapsed uveal tissue   HEMORRHOID SURGERY N/A 09/01/2016   Procedure:  HEMORRHOIDECTOMY;  Surgeon: [name], MD;  Location: [facility];  Service: General;  Laterality: N/A;   LAPAROTOMY N/A 12/04/2020   Procedure: EXPLORATORY LAPAROTOMY small bowel resection;  Surgeon: [name], MD;  Location: [facility] OR;  Service: General;  Laterality: N/A;   SUPERFICIAL KERATECTOMY Left 06-29-2011    [facility]    with EDTA scrub of left eye   TEE WITHOUT CARDIOVERSION  05-11-2009  dr vina gull   LVSEF 55-65%/  mild thickened AV without AI/  trace MR/ mixed fixed artherosclerosis plaqueing thoracic aorta/  no evidence thrombus/  by agitated saling and color doppler there was a PFO    I have reviewed the social history and family history with the patient and they are unchanged from previous note.  ALLERGIES:  has no known allergies.  MEDICATIONS:  Current Outpatient Medications  Medication Sig Dispense Refill   acetaminophen  (TYLENOL ) 500 MG tablet Take 2 tablets (1,000 mg total) by mouth every 6 (six) hours as needed. 30 tablet 0   Deferasirox  180 MG TABS Take 4 tablets (720 mg total) by mouth daily. Increase tablets up to goal dose as instructed by MD. Take on an empty stomach at least 30 minutes before a meal. 120 tablet 2   diclofenac  (VOLTAREN ) 75 MG EC tablet Take 1 tablet (75 mg total) by mouth 2 (two) times daily with meals. (Start on Day 7) 60 tablet 0   erythromycin  ophthalmic ointment Apply 0.5 inches to left eye at bedtime for 10 days. 3.5 g 2   hydrochlorothiazide  (HYDRODIURIL ) 25 MG tablet Take 1 tablet (25 mg total) by mouth daily for hypertension. 90 tablet 1   hydrochlorothiazide  (HYDRODIURIL ) 50 MG tablet Take 1/2 tablet (25 mg total) by mouth daily. 90 tablet 0   moxifloxacin  (VIGAMOX ) 0.5 % ophthalmic solution Place 1 drop into the left eye 4 (four) times daily for 10 days. 5 mL 5   Multiple Vitamin (MULTIVITAMIN WITH MINERALS) TABS tablet Take 1 tablet by mouth in the morning.     ondansetron  (ZOFRAN ) 8 MG tablet Take 1 tablet (8 mg  total) by mouth every 8 (eight) hours as needed for nausea or vomiting. 10 tablet 0   polyethylene glycol (MIRALAX / GLYCOLAX) 17 g packet Take 17 g by mouth in the morning.     predniSONE  (STERAPRED UNI-PAK 21 TAB) 10 MG (21) TBPK tablet Take as directed on package by mouth with meals. 21 tablet 0   timolol  (TIMOPTIC ) 0.5 % ophthalmic solution Place 1 drop into the left eye daily. 10 mL 11   timolol  (TIMOPTIC ) 0.5 % ophthalmic solution Place 1 drop into the left eye every morning. 10 mL 11   No current facility-administered medications for this visit.    PHYSICAL EXAMINATION: Not performed   LABORATORY DATA:  I have  reviewed the data as listed    Latest Ref Rng & Units 11/07/2023   12:14 PM 09/12/2023    9:02 AM 08/09/2023    8:56 AM  CBC  WBC 4.0 - 10.5 K/uL 9.1  5.2  6.1   Hemoglobin 13.0 - 17.0 g/dL 87.3  86.3  86.8   Hematocrit 39.0 - 52.0 % 38.1  39.3  39.0   Platelets 150 - 400 K/uL 278  196  258         Latest Ref Rng & Units 08/03/2023    9:12 AM 08/23/2022   12:26 PM 03/30/2022    3:00 PM  CMP  Glucose 70 - 99 mg/dL 98  873  89   BUN 8 - 23 mg/dL 20  15  15    Creatinine 0.61 - 1.24 mg/dL 8.93  9.05  8.90   Sodium 135 - 145 mmol/L 140  137  138   Potassium 3.5 - 5.1 mmol/L 4.4  3.6  3.6   Chloride 98 - 111 mmol/L 106  104  105   CO2 22 - 32 mmol/L 27  29  25    Calcium 8.9 - 10.3 mg/dL 9.8  9.3  8.8   Total Protein 6.5 - 8.1 g/dL 7.7  6.8  7.4   Total Bilirubin 0.0 - 1.2 mg/dL 0.4  0.5  0.4   Alkaline Phos 38 - 126 U/L 102  89  86   AST 15 - 41 U/L 19  16  39   ALT 0 - 44 U/L 14  11  28        RADIOGRAPHIC STUDIES: I have personally reviewed the radiological images as listed and agreed with the findings in the report. No results found.     I discussed the assessment and treatment plan with the patient. The patient was provided an opportunity to ask questions and all were answered. The patient agreed with the plan and demonstrated an understanding of the  instructions.   The patient was advised to call back or seek an in-person evaluation if the symptoms worsen or if the condition fails to improve as anticipated.  I provided 25 minutes of face-to-face video visit time during this encounter, including review of chart and various tests results, discussions about plan of care and coordination of care plan.    Onita Mattock, MD 12/05/23

## 2023-12-05 NOTE — Assessment & Plan Note (Signed)
-  diagnosed in 08/2016, pT2N0 --resection on 09/01/16 by Dr. Thomas, surgical path reviewed a 1.3 cm adenocarcinoma with invasion into anal sphincter muscle.  -Pelvic MRI 09/22/16 showed no residual tumor and no adenopathy.  -Patient completed adjuvant concurrent chemo and radiation 10/10/16-11/22/16, tolerated well overall. -repeated colonoscopy in 03/2021 showed Stricture in the distal sigmoid colon, scope was not able to pass, not malignant appearing, not biopsied -He is clinically doing well, lab reviewed, exam was unremarkable, there is no clinical concern for recurrence. -He has completed 5 years of surveillance.  

## 2023-12-06 ENCOUNTER — Other Ambulatory Visit: Payer: Self-pay

## 2023-12-07 ENCOUNTER — Telehealth: Payer: Self-pay | Admitting: Hematology

## 2023-12-07 ENCOUNTER — Telehealth: Payer: Self-pay

## 2023-12-07 ENCOUNTER — Other Ambulatory Visit: Payer: Self-pay

## 2023-12-07 NOTE — Telephone Encounter (Signed)
 [name] has been made aware of Rakesh's appointment follow up with Dr. Lanny.

## 2023-12-07 NOTE — Telephone Encounter (Addendum)
 Patient has an app at [employer] on 10/08 with Dr. Annella and message sent to [facility] to set up OV with us  after his PET and CT scan on 10/10.   ----- Message from Onita Mattock sent at 12/05/2023 10:46 AM EDT ----- Dr. Shelah,  Could your or one of your colleagues see this pt and do a bronchoscopy biopsy of his left lung mass in next 1-2 weeks? He had colon cancer 5 years ago, and still sees me because of iron overload based on lab and liver MRI, this was found on liver MRI, he is a smoker, mild chronic cough and no other respiratory symptoms. It has unfortunately metastasized to b/l lungs.   Duwaine, could you help to get hold of the pulmonary new pt scheduler to expedite the referral?  Thx much  Onita

## 2023-12-12 ENCOUNTER — Other Ambulatory Visit: Payer: Self-pay

## 2023-12-12 NOTE — Progress Notes (Signed)
 Specialty Pharmacy Refill Coordination Note  Juan David is a 68 y.o. male contacted today regarding refills of specialty medication(s) Deferasirox   Spoke with patient's wife  Patient requested [name] at [facility] at [place] date: 12/14/23   Medication will be filled on 10.8.25.

## 2023-12-13 ENCOUNTER — Encounter: Payer: Self-pay | Admitting: Pulmonary Disease

## 2023-12-13 ENCOUNTER — Telehealth: Payer: Self-pay | Admitting: Pulmonary Disease

## 2023-12-13 ENCOUNTER — Ambulatory Visit (INDEPENDENT_AMBULATORY_CARE_PROVIDER_SITE_OTHER): Admitting: Pulmonary Disease

## 2023-12-13 VITALS — BP 133/85 | HR 65 | Temp 98.0°F | Ht 71.0 in | Wt 120.8 lb

## 2023-12-13 DIAGNOSIS — R918 Other nonspecific abnormal finding of lung field: Secondary | ICD-10-CM

## 2023-12-13 NOTE — Telephone Encounter (Signed)
 Please schedule the following:  Provider performing procedure: Dr. Shelah Diagnosis: lung mass, satellite nodules bilaterally Which side if for nodule / mass? L sided mass, LLL nodule, RLL Nodule Procedure: navigational bronchoscopy  Has patient been spoken to by Provider and given informed consent? yes Anesthesia: general Do you need Fluro? yes Duration of procedure: per Dr. Shelah Date: 12/19/2023 Time: n/a Location: [facility]oscopy Does patient have OSA? no DM? no Or Latex allergy? no Medication Restriction/ Anticoagulate/Antiplatelet: no Pre-op Labs Ordered:determined by Anesthesia Imaging request: in PACS  (If, SuperDimension CT Chest, please have STAT courier sent to ENDO)

## 2023-12-13 NOTE — Telephone Encounter (Signed)
 Thank you :)

## 2023-12-13 NOTE — H&P (View-Only) (Signed)
 @Patient  ID: Juan David, male    DOB: March 03, 1956, 68 y.o.   MRN: [id number]  No chief complaint on file.   Referring provider: [name], MD  HPI:   68 y.o. male with history of rectal adenocarcinoma status posttreatment in 2018 here for evaluation of left upper lobe lung mass with satellite lesions in the lung.  Note from oncologist reviewed.  Most recent pulmonary note in 2020 reviewed.  Patient and overall fairly good health.  CT scan late last month demonstrated left lung mass up to 4 cm and scattered nodules 9 mm left lower lobe and 10 mm right lower lobe particular concerning for satellite lesions.  This prompted referral.  We discussed in detail the findings of CT scan.  Concern for malignancy, lung primary.  The need for biopsy and need for establishing staging prior to additional planning for treatment.  Discussed risks and benefits of bronchoscopic navigational biopsy.  He expressed understanding and eager to move forward.  Answered all questions posed by him and spouse in the room to the best of my ability.  Questionaires / Pulmonary Flowsheets:   ACT:      No data to display          MMRC:     No data to display          Epworth:      No data to display          Tests:   FENO:  No results found for: NITRICOXIDE  PFT:     No data to display          WALK:      No data to display          Imaging: Personally reviewed CT Chest Wo Contrast Result Date: 12/05/2023 CLINICAL DATA:  Pulmonary nodule. History of rectal adenocarcinoma. * Tracking Code: BO * EXAM: CT CHEST WITHOUT CONTRAST TECHNIQUE: Multidetector CT imaging of the chest was performed following the standard protocol without IV contrast. RADIATION DOSE REDUCTION: This exam was performed according to the departmental dose-optimization program which includes automated exposure control, adjustment of the mA and/or kV according to patient size and/or use of iterative reconstruction  technique. COMPARISON:  11/24/2017 FINDINGS: Cardiovascular: The heart size is normal. No substantial pericardial effusion. Coronary artery calcification is evident. Mild atherosclerotic calcification is noted in the wall of the thoracic aorta. Ascending thoracic aorta measures 3.5 cm diameter. Transverse thoracic aorta measures up to 3.7 cm diameter. Mediastinum/Nodes: No mediastinal lymphadenopathy. No evidence for gross hilar lymphadenopathy although assessment is limited by the lack of intravenous contrast on the current study. The esophagus has normal imaging features. There is no axillary lymphadenopathy. Lungs/Pleura: Centrilobular and paraseptal emphysema evident. Multi lobular posterior left upper lobe 3.9 x 2.8 x 2.7 cm pulmonary mass evident with numerous adjacent satellite nodules in areas of adjacent peripheral airway impaction. 9 mm paraspinal left lower lobe nodule on image 107/8 is new in the interval. Another 8 mm left lower lobe nodule on 124/8 is new since prior. 10 mm nodule in the right lower lobe with lobular contour (130/8) is new since prior. No pleural effusion.  No pneumothorax. Upper Abdomen: 2.1 cm well-defined homogeneous water  density lesion in the dome of the spleen has benign CT imaging characteristics. This lesion was present but smaller on the study from 6 years ago, compatible with benign etiology. Small cyst noted upper pole right kidney. No followup imaging is recommended. Musculoskeletal: No worrisome lytic or sclerotic osseous abnormality.  IMPRESSION: 1. Multi lobular posterior left upper lobe pulmonary mass with numerous adjacent satellite nodules and areas of adjacent peripheral airway impaction. Imaging features may reflect metastatic disease or primary bronchogenic neoplasm. 2. Additional bilateral lower lobe pulmonary nodules are new in the interval. Metastatic disease a concern. 3. Aortic Atherosclerosis (ICD10-I70.0) and Emphysema (ICD10-J43.9). Electronically Signed    By: Camellia Candle M.D.   On: 12/05/2023 08:31    Lab Results: Personally reviewed CBC    Component Value Date/Time   WBC 9.1 11/07/2023 1214   WBC 5.4 11/22/2022 0938   RBC 4.53 11/07/2023 1214   HGB 12.6 (L) 11/07/2023 1214   HGB 12.6 (L) 12/27/2016 1237   HCT 38.1 (L) 11/07/2023 1214   HCT 38.0 (L) 12/27/2016 1237   PLT 278 11/07/2023 1214   PLT 268 12/27/2016 1237   MCV 84.1 11/07/2023 1214   MCV 92.0 12/27/2016 1237   MCH 27.8 11/07/2023 1214   MCHC 33.1 11/07/2023 1214   RDW 15.0 11/07/2023 1214   RDW 16.5 (H) 12/27/2016 1237   LYMPHSABS 1.9 11/07/2023 1214   LYMPHSABS 0.6 (L) 12/27/2016 1237   MONOABS 0.8 11/07/2023 1214   MONOABS 0.5 12/27/2016 1237   EOSABS 0.2 11/07/2023 1214   EOSABS 0.1 12/27/2016 1237   BASOSABS 0.0 11/07/2023 1214   BASOSABS 0.0 12/27/2016 1237    BMET    Component Value Date/Time   NA 140 08/03/2023 0912   NA 139 12/27/2016 1237   K 4.4 08/03/2023 0912   K 4.2 12/27/2016 1237   CL 106 08/03/2023 0912   CO2 27 08/03/2023 0912   CO2 25 12/27/2016 1237   GLUCOSE 98 08/03/2023 0912   GLUCOSE 112 12/27/2016 1237   BUN 20 08/03/2023 0912   BUN 15.2 12/27/2016 1237   CREATININE 1.06 08/03/2023 0912   CREATININE 1.1 12/27/2016 1237   CALCIUM 9.8 08/03/2023 0912   CALCIUM 9.7 12/27/2016 1237   GFRNONAA >60 08/03/2023 0912   GFRAA >60 11/20/2017 0948    BNP No results found for: BNP  ProBNP No results found for: PROBNP  Specialty Problems   None   No Known Allergies  Immunization History  Administered Date(s) Administered   Influenza Whole 10/23/2017   Influenza,inj,Quad PF,6+ Mos 12/21/2020    Past Medical History:  Diagnosis Date   Arthritis    Chronic anemia    History of cardiovascular stress test    per pt in 1980's , told was normal   History of DVT of lower extremity    post right knee surgery 1998  lower extremitiy  treated w/ coumadin for a year/  per pt no dvt since   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 in [place]   08-18-2001   Wears glasses     Tobacco History: Social History   Tobacco Use  Smoking Status Former   Types: Cigars  Smokeless Tobacco Former   Quit date: 08/25/2008  Tobacco Comments   per pt former smoker of small cigar x2 daily (average) previous smoked cigeratte until 2010   Counseling given: Not  Answered Tobacco comments: per pt former smoker of small cigar x2 daily (average) previous smoked cigeratte until 2010   Continue to not smoke  Outpatient Encounter Medications as of 12/13/2023  Medication Sig   acetaminophen  (TYLENOL ) 500 MG tablet Take 2 tablets (1,000 mg total) by mouth every 6 (six) hours as needed.   Deferasirox  180 MG TABS Take 4 tablets (720 mg total) by mouth daily. Increase tablets up to goal dose as instructed by MD. Take on an empty stomach at least 30 minutes before a meal.   diclofenac  (VOLTAREN ) 75 MG EC tablet Take 1 tablet (75 mg total) by mouth 2 (two) times daily with meals. (Start on Day 7)   erythromycin  ophthalmic ointment Apply 0.5 inches to left eye at bedtime for 10 days.   hydrochlorothiazide  (HYDRODIURIL ) 25 MG tablet Take 1 tablet (25 mg total) by mouth daily for hypertension.   moxifloxacin  (VIGAMOX ) 0.5 % ophthalmic solution Place 1 drop into the left eye 4 (four) times daily for 10 days.   Multiple Vitamin (MULTIVITAMIN WITH MINERALS) TABS tablet Take 1 tablet by mouth in the morning.   ondansetron  (ZOFRAN ) 8 MG tablet Take 1 tablet (8 mg total) by mouth every 8 (eight) hours as needed for nausea or vomiting.   polyethylene glycol (MIRALAX / GLYCOLAX) 17 g packet Take 17 g  by mouth in the morning.   timolol  (TIMOPTIC ) 0.5 % ophthalmic solution Place 1 drop into the left eye daily.   timolol  (TIMOPTIC ) 0.5 % ophthalmic solution Place 1 drop into the left eye every morning.   [DISCONTINUED] hydrochlorothiazide  (HYDRODIURIL ) 50 MG tablet Take 1/2 tablet (25 mg total) by mouth daily.   [DISCONTINUED] predniSONE  (STERAPRED UNI-PAK 21 TAB) 10 MG (21) TBPK tablet Take as directed on package by mouth with meals.   No facility-administered encounter medications on file as of 12/13/2023.     Review of Systems  Review of Systems  Comprehensive review of system negative unless mentioned in HPI Physical Exam  BP 133/85   Pulse 65   Temp 98 F (36.7 C) (Oral)   Ht 5' 11 (1.803 m)   Wt 120 lb 12.8 oz (54.8 kg)   SpO2 95%   BMI 16.85 kg/m   Wt Readings from Last 5 Encounters:  12/13/23 120 lb 12.8 oz (54.8 kg)  11/15/23 119 lb (54 kg)  08/10/23 128 lb 8 oz (58.3 kg)  07/20/23 126 lb 6.4 oz (57.3 kg)  06/22/23 132 lb 11.2 oz (60.2 kg)    BMI Readings from Last 5 Encounters:  12/13/23 16.85 kg/m  11/15/23 16.60 kg/m  08/10/23 17.92 kg/m  07/20/23 17.63 kg/m  06/22/23 18.51 kg/m     Physical Exam General: Sitting in chair, no acute distress Eyes: EOMI, no icterus Neck: Supple, JVP Pulmonary: Normal work of breathing, on room air Abdomen: Nondistended MSK: No synovitis, no joint effusion Neuro: Normal gait, no weakness Psych: Normal mood, full affect    Assessment & Plan:   Left upper lobe lung mass and left lower lobe and right lower lobe satellite nodules concerning for primary lung malignancy with metastases: Demonstrated on CT scan 12/05/2023.  Discussed at length the likely etiology and next steps regarding bronchoscopic biopsy of left upper lobe mass, and satellite lesions left lower lobe and right lower lobe.  Have discussed with my colleagues that perform navigational bronchoscopy with biopsy..  Have requested date 12/19/2023 (next  available date) at [facility] with Dr. Shelah, Case request and and message sent to  schedulers.  I have messaged Dr. Lanny regarding updated plan and timeline as it stands at this time.  Risk and benefits of procedure discussed in detail with patient today.   No follow-ups on file.   Donnice JONELLE Beals, MD 12/13/2023   This appointment required 45 minutes of patient care (this includes precharting, chart review, review of results, face-to-face care, etc.).

## 2023-12-13 NOTE — Progress Notes (Signed)
 @Patient  ID: Juan David, male    DOB: March 03, 1956, 68 y.o.   MRN: [id number]  No chief complaint on file.   Referring provider: [name], MD  HPI:   68 y.o. male with history of rectal adenocarcinoma status posttreatment in 2018 here for evaluation of left upper lobe lung mass with satellite lesions in the lung.  Note from oncologist reviewed.  Most recent pulmonary note in 2020 reviewed.  Patient and overall fairly good health.  CT scan late last month demonstrated left lung mass up to 4 cm and scattered nodules 9 mm left lower lobe and 10 mm right lower lobe particular concerning for satellite lesions.  This prompted referral.  We discussed in detail the findings of CT scan.  Concern for malignancy, lung primary.  The need for biopsy and need for establishing staging prior to additional planning for treatment.  Discussed risks and benefits of bronchoscopic navigational biopsy.  He expressed understanding and eager to move forward.  Answered all questions posed by him and spouse in the room to the best of my ability.  Questionaires / Pulmonary Flowsheets:   ACT:      No data to display          MMRC:     No data to display          Epworth:      No data to display          Tests:   FENO:  No results found for: NITRICOXIDE  PFT:     No data to display          WALK:      No data to display          Imaging: Personally reviewed CT Chest Wo Contrast Result Date: 12/05/2023 CLINICAL DATA:  Pulmonary nodule. History of rectal adenocarcinoma. * Tracking Code: BO * EXAM: CT CHEST WITHOUT CONTRAST TECHNIQUE: Multidetector CT imaging of the chest was performed following the standard protocol without IV contrast. RADIATION DOSE REDUCTION: This exam was performed according to the departmental dose-optimization program which includes automated exposure control, adjustment of the mA and/or kV according to patient size and/or use of iterative reconstruction  technique. COMPARISON:  11/24/2017 FINDINGS: Cardiovascular: The heart size is normal. No substantial pericardial effusion. Coronary artery calcification is evident. Mild atherosclerotic calcification is noted in the wall of the thoracic aorta. Ascending thoracic aorta measures 3.5 cm diameter. Transverse thoracic aorta measures up to 3.7 cm diameter. Mediastinum/Nodes: No mediastinal lymphadenopathy. No evidence for gross hilar lymphadenopathy although assessment is limited by the lack of intravenous contrast on the current study. The esophagus has normal imaging features. There is no axillary lymphadenopathy. Lungs/Pleura: Centrilobular and paraseptal emphysema evident. Multi lobular posterior left upper lobe 3.9 x 2.8 x 2.7 cm pulmonary mass evident with numerous adjacent satellite nodules in areas of adjacent peripheral airway impaction. 9 mm paraspinal left lower lobe nodule on image 107/8 is new in the interval. Another 8 mm left lower lobe nodule on 124/8 is new since prior. 10 mm nodule in the right lower lobe with lobular contour (130/8) is new since prior. No pleural effusion.  No pneumothorax. Upper Abdomen: 2.1 cm well-defined homogeneous water  density lesion in the dome of the spleen has benign CT imaging characteristics. This lesion was present but smaller on the study from 6 years ago, compatible with benign etiology. Small cyst noted upper pole right kidney. No followup imaging is recommended. Musculoskeletal: No worrisome lytic or sclerotic osseous abnormality.  IMPRESSION: 1. Multi lobular posterior left upper lobe pulmonary mass with numerous adjacent satellite nodules and areas of adjacent peripheral airway impaction. Imaging features may reflect metastatic disease or primary bronchogenic neoplasm. 2. Additional bilateral lower lobe pulmonary nodules are new in the interval. Metastatic disease a concern. 3. Aortic Atherosclerosis (ICD10-I70.0) and Emphysema (ICD10-J43.9). Electronically Signed    By: Camellia Candle M.D.   On: 12/05/2023 08:31    Lab Results: Personally reviewed CBC    Component Value Date/Time   WBC 9.1 11/07/2023 1214   WBC 5.4 11/22/2022 0938   RBC 4.53 11/07/2023 1214   HGB 12.6 (L) 11/07/2023 1214   HGB 12.6 (L) 12/27/2016 1237   HCT 38.1 (L) 11/07/2023 1214   HCT 38.0 (L) 12/27/2016 1237   PLT 278 11/07/2023 1214   PLT 268 12/27/2016 1237   MCV 84.1 11/07/2023 1214   MCV 92.0 12/27/2016 1237   MCH 27.8 11/07/2023 1214   MCHC 33.1 11/07/2023 1214   RDW 15.0 11/07/2023 1214   RDW 16.5 (H) 12/27/2016 1237   LYMPHSABS 1.9 11/07/2023 1214   LYMPHSABS 0.6 (L) 12/27/2016 1237   MONOABS 0.8 11/07/2023 1214   MONOABS 0.5 12/27/2016 1237   EOSABS 0.2 11/07/2023 1214   EOSABS 0.1 12/27/2016 1237   BASOSABS 0.0 11/07/2023 1214   BASOSABS 0.0 12/27/2016 1237    BMET    Component Value Date/Time   NA 140 08/03/2023 0912   NA 139 12/27/2016 1237   K 4.4 08/03/2023 0912   K 4.2 12/27/2016 1237   CL 106 08/03/2023 0912   CO2 27 08/03/2023 0912   CO2 25 12/27/2016 1237   GLUCOSE 98 08/03/2023 0912   GLUCOSE 112 12/27/2016 1237   BUN 20 08/03/2023 0912   BUN 15.2 12/27/2016 1237   CREATININE 1.06 08/03/2023 0912   CREATININE 1.1 12/27/2016 1237   CALCIUM 9.8 08/03/2023 0912   CALCIUM 9.7 12/27/2016 1237   GFRNONAA >60 08/03/2023 0912   GFRAA >60 11/20/2017 0948    BNP No results found for: BNP  ProBNP No results found for: PROBNP  Specialty Problems   None   No Known Allergies  Immunization History  Administered Date(s) Administered   Influenza Whole 10/23/2017   Influenza,inj,Quad PF,6+ Mos 12/21/2020    Past Medical History:  Diagnosis Date   Arthritis    Chronic anemia    History of cardiovascular stress test    per pt in 1980's , told was normal   History of DVT of lower extremity    post right knee surgery 1998  lower extremitiy  treated w/ coumadin for a year/  per pt no dvt since   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 in [place]   08-18-2001   Wears glasses     Tobacco History: Social History   Tobacco Use  Smoking Status Former   Types: Cigars  Smokeless Tobacco Former   Quit date: 08/25/2008  Tobacco Comments   per pt former smoker of small cigar x2 daily (average) previous smoked cigeratte until 2010   Counseling given: Not  Answered Tobacco comments: per pt former smoker of small cigar x2 daily (average) previous smoked cigeratte until 2010   Continue to not smoke  Outpatient Encounter Medications as of 12/13/2023  Medication Sig   acetaminophen  (TYLENOL ) 500 MG tablet Take 2 tablets (1,000 mg total) by mouth every 6 (six) hours as needed.   Deferasirox  180 MG TABS Take 4 tablets (720 mg total) by mouth daily. Increase tablets up to goal dose as instructed by MD. Take on an empty stomach at least 30 minutes before a meal.   diclofenac  (VOLTAREN ) 75 MG EC tablet Take 1 tablet (75 mg total) by mouth 2 (two) times daily with meals. (Start on Day 7)   erythromycin  ophthalmic ointment Apply 0.5 inches to left eye at bedtime for 10 days.   hydrochlorothiazide  (HYDRODIURIL ) 25 MG tablet Take 1 tablet (25 mg total) by mouth daily for hypertension.   moxifloxacin  (VIGAMOX ) 0.5 % ophthalmic solution Place 1 drop into the left eye 4 (four) times daily for 10 days.   Multiple Vitamin (MULTIVITAMIN WITH MINERALS) TABS tablet Take 1 tablet by mouth in the morning.   ondansetron  (ZOFRAN ) 8 MG tablet Take 1 tablet (8 mg total) by mouth every 8 (eight) hours as needed for nausea or vomiting.   polyethylene glycol (MIRALAX / GLYCOLAX) 17 g packet Take 17 g  by mouth in the morning.   timolol  (TIMOPTIC ) 0.5 % ophthalmic solution Place 1 drop into the left eye daily.   timolol  (TIMOPTIC ) 0.5 % ophthalmic solution Place 1 drop into the left eye every morning.   [DISCONTINUED] hydrochlorothiazide  (HYDRODIURIL ) 50 MG tablet Take 1/2 tablet (25 mg total) by mouth daily.   [DISCONTINUED] predniSONE  (STERAPRED UNI-PAK 21 TAB) 10 MG (21) TBPK tablet Take as directed on package by mouth with meals.   No facility-administered encounter medications on file as of 12/13/2023.     Review of Systems  Review of Systems  Comprehensive review of system negative unless mentioned in HPI Physical Exam  BP 133/85   Pulse 65   Temp 98 F (36.7 C) (Oral)   Ht 5' 11 (1.803 m)   Wt 120 lb 12.8 oz (54.8 kg)   SpO2 95%   BMI 16.85 kg/m   Wt Readings from Last 5 Encounters:  12/13/23 120 lb 12.8 oz (54.8 kg)  11/15/23 119 lb (54 kg)  08/10/23 128 lb 8 oz (58.3 kg)  07/20/23 126 lb 6.4 oz (57.3 kg)  06/22/23 132 lb 11.2 oz (60.2 kg)    BMI Readings from Last 5 Encounters:  12/13/23 16.85 kg/m  11/15/23 16.60 kg/m  08/10/23 17.92 kg/m  07/20/23 17.63 kg/m  06/22/23 18.51 kg/m     Physical Exam General: Sitting in chair, no acute distress Eyes: EOMI, no icterus Neck: Supple, JVP Pulmonary: Normal work of breathing, on room air Abdomen: Nondistended MSK: No synovitis, no joint effusion Neuro: Normal gait, no weakness Psych: Normal mood, full affect    Assessment & Plan:   Left upper lobe lung mass and left lower lobe and right lower lobe satellite nodules concerning for primary lung malignancy with metastases: Demonstrated on CT scan 12/05/2023.  Discussed at length the likely etiology and next steps regarding bronchoscopic biopsy of left upper lobe mass, and satellite lesions left lower lobe and right lower lobe.  Have discussed with my colleagues that perform navigational bronchoscopy with biopsy..  Have requested date 12/19/2023 (next  available date) at [facility] with Dr. Shelah, Case request and and message sent to  schedulers.  I have messaged Dr. Lanny regarding updated plan and timeline as it stands at this time.  Risk and benefits of procedure discussed in detail with patient today.   No follow-ups on file.   Donnice JONELLE Beals, MD 12/13/2023   This appointment required 45 minutes of patient care (this includes precharting, chart review, review of results, face-to-face care, etc.).

## 2023-12-14 ENCOUNTER — Telehealth: Payer: Self-pay

## 2023-12-14 ENCOUNTER — Encounter: Payer: Self-pay | Admitting: Emergency Medicine

## 2023-12-14 NOTE — Telephone Encounter (Signed)
 This is scheduled I will call the patient with the info and will forward this to [facility] for the auth

## 2023-12-14 NOTE — Telephone Encounter (Signed)
 Spoke with pt's wife regarding f/u appt.  Pt's wife requested if the appt on 12/20/2023 could be rescheduled to 12/28/2023 to discuss pt's bronchoscopy biopsy results. Appt rescheduled and changed to a telephone visit.  Pt's wife confirmed appt date and time

## 2023-12-15 ENCOUNTER — Ambulatory Visit (HOSPITAL_BASED_OUTPATIENT_CLINIC_OR_DEPARTMENT_OTHER)
Admission: RE | Admit: 2023-12-15 | Discharge: 2023-12-15 | Disposition: A | Discharge status: Home / Self Care | Source: Ambulatory Visit | Attending: Hematology | Admitting: Hematology

## 2023-12-15 ENCOUNTER — Ambulatory Visit (HOSPITAL_COMMUNITY)
Admission: RE | Admit: 2023-12-15 | Discharge: 2023-12-15 | Disposition: A | Discharge status: Home / Self Care | Source: Ambulatory Visit | Attending: Hematology | Admitting: Hematology

## 2023-12-15 DIAGNOSIS — R918 Other nonspecific abnormal finding of lung field: Secondary | ICD-10-CM | POA: Diagnosis not present

## 2023-12-15 DIAGNOSIS — C3490 Malignant neoplasm of unspecified part of unspecified bronchus or lung: Secondary | ICD-10-CM | POA: Insufficient documentation

## 2023-12-15 LAB — GLUCOSE, CAPILLARY: Glucose-Capillary: 93 mg/dL (ref 70–99)

## 2023-12-15 MED ORDER — GADOBUTROL 1 MMOL/ML IV SOLN
5.0000 mL | Freq: Once | INTRAVENOUS | Status: AC | PRN
  Administered 2023-12-15: 5 mL via INTRAVENOUS

## 2023-12-15 MED ORDER — FLUDEOXYGLUCOSE F - 18 (FDG) INJECTION
6.0500 | Freq: Once | INTRAVENOUS | Status: AC | PRN
  Administered 2023-12-15: 6.05 via INTRAVENOUS

## 2023-12-20 ENCOUNTER — Inpatient Hospital Stay: Attending: Hematology | Admitting: Hematology

## 2023-12-22 ENCOUNTER — Other Ambulatory Visit: Payer: Self-pay

## 2023-12-22 ENCOUNTER — Telehealth: Payer: Self-pay

## 2023-12-22 ENCOUNTER — Encounter (HOSPITAL_COMMUNITY): Payer: Self-pay | Admitting: Emergency Medicine

## 2023-12-22 NOTE — Progress Notes (Signed)
 Anesthesia Chart Review:  Case: [id number] Date/Time: 12/25/23 1303   Procedure: VIDEO BRONCHOSCOPY WITH ENDOBRONCHIAL NAVIGATION (Bilateral)   Anesthesia type: General   Diagnosis: Lung mass [R91.8]   Pre-op diagnosis: lung mass, bilateral lung ndules concerning for metastasis   [facility] / [facility]   Surgeons: Shelah Lamar RAMAN, MD       DISCUSSION: Patient is a 68 year old male scheduled for the above procedure. 11/2023 CT showed LUL mass with satellite lesions in BLL concerning for primary lung malignancy with metastasis.  History includes former smoker (and former smokeless tobacco uses), HTN, anemia, PFO, post-operative RLE DVT (1998), left ruptured globe (struck by metal fragment while lawn mowing s/p surgery 08/18/2001;  traumatic left eye glaucoma, legally blind in left eye), rectal adenocarcinoma (T2 rectal cancer s/p excision via transanal biopsy 09/01/2016, s/p chemoradiation, declined APR), small bowel ischemic perforation (s/p small bowel resection 12/04/2020, IR drain for abscess 12/10/2020), iron overload (likely related to chronic inflammation, s/p periodic phlebotomy, started deferasirox  in 06/2023)   He is a same day work-up. Anesthesia team to evaluate on the day of surgery. Updated labs and EKG on arrival as indicated.    VS:  Wt Readings from Last 3 Encounters:  12/13/23 54.8 kg  11/15/23 54 kg  08/10/23 58.3 kg   BP Readings from Last 3 Encounters:  12/13/23 133/85  11/15/23 134/84  09/14/23 (!) 146/84   Pulse Readings from Last 3 Encounters:  12/13/23 65  11/15/23 70  09/14/23 78    PROVIDERS: [name], MD is PCP  [name], MD is DOUGLASS [name], MD is RAD-ONC Avram Pitts, MD is GI Dasie Best, MD is general surgeon   LABS: Most recent results in [facility] include: Lab Results  Component Value Date   WBC 9.1 11/07/2023   HGB 12.6 (L) 11/07/2023   HCT 38.1 (L) 11/07/2023   PLT 278 11/07/2023   GLUCOSE 98  08/03/2023   ALT 14 08/03/2023   AST 19 08/03/2023   NA 140 08/03/2023   K 4.4 08/03/2023   CL 106 08/03/2023   CREATININE 1.06 08/03/2023   BUN 20 08/03/2023   CO2 27 08/03/2023    IMAGES: MRI Brain 12/15/2023: IMPRESSION: No evidence of metastatic disease  PET Scan 12/15/2023: Normal sinus rhythm Left ventricular hypertrophy ST elevation, consider early repolarization Abnormal ECG No significant change since last tracing 15 years ago Confirmed by Claudene Pacific ([id number]) on 09/01/2016 5:54:32 PM  CT Chest 9/24/205: IMPRESSION: 1. Multi lobular posterior left upper lobe pulmonary mass with numerous adjacent satellite nodules and areas of adjacent peripheral airway impaction. Imaging features may reflect metastatic disease or primary bronchogenic neoplasm. 2. Additional bilateral lower lobe pulmonary nodules are new in the interval. Metastatic disease a concern. 3. Aortic Atherosclerosis (ICD10-I70.0) and Emphysema (ICD10-J43.9).    EKG: Last EKG noted is from 09/01/2016: Normal sinus rhythm Left ventricular hypertrophy ST elevation, consider early repolarization Abnormal ECG No significant change since last tracing 15 years ago Confirmed by Claudene Pacific ([id number]) on 09/01/2016 5:54:32 PM   CV: TEE 05/11/2009: Study Conclusions   - Left ventricle: Systolic function was normal. The estimated     ejection fraction was in the range of 55% to 65%.   - Left atrium: No evidence of thrombus in the atrial cavity or     appendage.   - Atrial septum: PFO present by color doppler. With injection of     agitated saline there were asignificant number of bubbles  that     appeared in LA after abdomenal pressure.   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   Hypertension    Iron deficiency    Legally blind in left eye, as defined in [place]     per pt only see light   PFO (patent foramen ovale)    per TEE done 05-11-2009    Rectal adenocarcinoma (HCC) 09/28/2016   Traumatic glaucoma, 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 at [facility] in [place]   08-18-2001   Wears glasses     Past Surgical History:  Procedure Laterality Date   ARTHROSCOPIC REPAIR ACL Right 1998   BIOPSY  03/16/2021   Procedure: BIOPSY;  Surgeon: [name], MD;  Location: [facility];  Service: Endoscopy;;   ESOPHAGOGASTRODUODENOSCOPY (EGD) WITH PROPOFOL  N/A 03/16/2021   Procedure: ESOPHAGOGASTRODUODENOSCOPY (EGD) WITH PROPOFOL ;  Surgeon: [name], MD;  Location: [facility]OS[facility];  Service: Endoscopy;  Laterality: N/A;   EXPLORATION AND REPAIR LEFT EYE INJURY  08-18-2001   [place]   ruptured globe- repair corneal laceration, reposition of prolapsed uveal tissue   HEMORRHOID SURGERY N/A 09/01/2016   Procedure: HEMORRHOIDECTOMY;  Surgeon: [name], MD;  Location: [facility];  Service: General;  Laterality: N/A;   LAPAROTOMY N/A 12/04/2020   Procedure: EXPLORATORY LAPAROTOMY small bowel resection;  Surgeon: [name], MD;  Location: [facility];  Service: General;  Laterality: N/A;   SUPERFICIAL KERATECTOMY Left 06-29-2011    [facility]    with EDTA scrub of left eye   TEE WITHOUT CARDIOVERSION  05-11-2009  dr vina gull   LVSEF 55-65%/  mild thickened AV without AI/  trace MR/ mixed fixed artherosclerosis plaqueing thoracic aorta/  no evidence thrombus/  by agitated saling and color doppler there was a PFO    MEDICATIONS: No current facility-administered medications for this encounter.    acetaminophen  (TYLENOL ) 500 MG tablet   Deferasirox  180 MG TABS   diclofenac  (VOLTAREN ) 75 MG EC tablet   erythromycin  ophthalmic  ointment   hydrochlorothiazide  (HYDRODIURIL ) 25 MG tablet   moxifloxacin  (VIGAMOX ) 0.5 % ophthalmic solution   Multiple Vitamin (MULTIVITAMIN WITH MINERALS) TABS tablet   ondansetron  (ZOFRAN ) 8 MG tablet   polyethylene glycol (MIRALAX / GLYCOLAX) 17 g packet   timolol  (TIMOPTIC ) 0.5 % ophthalmic solution   timolol  (TIMOPTIC ) 0.5 % ophthalmic solution    Isaiah Ruder, PA-C Surgical Short Stay/Anesthesiology [facility] Phone [phone] [facility] Phone [phone] 12/22/2023 11:48 AM

## 2023-12-22 NOTE — Progress Notes (Signed)
 PCP - Tanda Chesley Eke, MD  EKG - DOS Stress Test - [phone] was told it was normal ECHO - 05/11/09  Anesthesia review: Y  Patient verbally denies any shortness of breath, fever, cough and chest pain during phone call   -------------  SDW INSTRUCTIONS given:  Your procedure is scheduled on Monday, Oct 20th.  Report to [facility] Main Entrance A at 1030 A.M., and check in at the Admitting office.  Call this number if you have problems the morning of surgery:  [phone]   Remember:  Do not eat or drink after midnight the night before your surgery    Take these medicines the morning of surgery with A SIP OF WATER   timolol  (TIMOPTIC )  acetaminophen  (TYLENOL )-if needed  As of today, STOP taking any Aspirin (unless otherwise instructed by your surgeon) Aleve , Naproxen , Ibuprofen , Motrin , Advil , Goody's, BC's, all herbal medications, fish oil , and all vitamins.                      Do not wear jewelry, make up, or nail polish            Do not wear lotions, powders, perfumes/colognes, or deodorant.            Do not shave 48 hours prior to surgery.  Men may shave face and neck.            Do not bring valuables to the hospital.            [facility] is not responsible for any belongings or valuables.  Do NOT Smoke (Tobacco/Vaping) 24 hours prior to your procedure If you use a CPAP at night, you may bring all equipment for your overnight stay.   Contacts, glasses, dentures or bridgework may not be worn into surgery.      For patients admitted to the hospital, discharge time will be determined by your treatment team.   Patients discharged the day of surgery will not be allowed to drive home, and someone needs to stay with them for 24 hours.    Special instructions:   [place]- Preparing For Surgery  Before surgery, you can play an important role. Because skin is not sterile, your skin needs to be as free of germs as possible. You can reduce the number of germs on  your skin by washing with CHG (chlorahexidine gluconate) Soap before surgery.  CHG is an antiseptic cleaner which kills germs and bonds with the skin to continue killing germs even after washing.    Oral Hygiene is also important to reduce your risk of infection.  Remember - BRUSH YOUR TEETH THE MORNING OF SURGERY WITH YOUR REGULAR TOOTHPASTE  Please do not use if you have an allergy to CHG or antibacterial soaps. If your skin becomes reddened/irritated stop using the CHG.  Do not shave (including legs and underarms) for at least 48 hours prior to first CHG shower. It is OK to shave your face.  Please follow these instructions carefully.   Shower the NIGHT BEFORE SURGERY and the MORNING OF SURGERY with DIAL Soap.   Pat yourself dry with a CLEAN TOWEL.  Wear CLEAN PAJAMAS to bed the night before surgery  Place CLEAN SHEETS on your bed the night of your first shower and DO NOT SLEEP WITH PETS.   Day of Surgery: Please shower morning of surgery  Wear Clean/Comfortable clothing the morning of surgery Do not apply any deodorants/lotions.   Remember to brush your teeth  WITH YOUR REGULAR TOOTHPASTE.   Questions were answered. Patient verbalized understanding of instructions.

## 2023-12-22 NOTE — Telephone Encounter (Signed)
 Copied from CRM #[id number]. Topic: Clinical - Medical Advice >> Dec 22, 2023 11:07 AM Corean SAUNDERS wrote: Reason for CRM: Patients wife [name] is requesting a call back as she has questions regarding the patients scheduled Bronchoscopy on Monday 10/20  Please call [name] back at [phone] (H)   Called patient and answered all questions.NFN

## 2023-12-22 NOTE — Anesthesia Preprocedure Evaluation (Addendum)
 Anesthesia Evaluation  Patient identified by MRN, date of birth, ID band Patient awake    Reviewed: Allergy & Precautions, NPO status , Patient's Chart, lab work & pertinent test results  Airway Mallampati: I  TM Distance: >3 FB     Dental  (+) Teeth Intact, Dental Advisory Given   Pulmonary Patient abstained from smoking., former smoker Lung mass LUL Bilateral pulmonary nodules   breath sounds clear to auscultation + decreased breath sounds      Cardiovascular hypertension, Pt. on medications Normal cardiovascular exam Rhythm:Regular Rate:Normal     Neuro/Psych Legally blind OS Hx/o ruptured globe OS- penetrating trauma Glaucoma OS negative neurological ROS  negative psych ROS   GI/Hepatic Neg liver ROS,,,Hx/o rectal adenoCa   Endo/Other  negative endocrine ROS    Renal/GU negative Renal ROS  negative genitourinary   Musculoskeletal  (+) Arthritis , Osteoarthritis,    Abdominal   Peds  Hematology  (+) Blood dyscrasia, anemia   Anesthesia Other Findings   Reproductive/Obstetrics                              Anesthesia Physical Anesthesia Plan  ASA: 3  Anesthesia Plan: General   Post-op Pain Management: Minimal or no pain anticipated   Induction:   PONV Risk Score and Plan: 3 and Treatment may vary due to age or medical condition, Propofol  infusion, TIVA and Ondansetron   Airway Management Planned: Oral ETT  Additional Equipment: None  Intra-op Plan:   Post-operative Plan: Extubation in OR  Informed Consent: I have reviewed the patients History and Physical, chart, labs and discussed the procedure including the risks, benefits and alternatives for the proposed anesthesia with the patient or authorized representative who has indicated his/her understanding and acceptance.     Dental advisory given  Plan Discussed with: CRNA and Anesthesiologist  Anesthesia Plan  Comments: (PAT note written 12/22/2023 by Isaiah Ruder, PA-C. + PFO on 2011 TEE.    )         Anesthesia Quick Evaluation

## 2023-12-25 ENCOUNTER — Encounter (HOSPITAL_COMMUNITY)
Admission: RE | Disposition: A | Discharge status: Home / Self Care | Payer: Self-pay | Source: Home / Self Care | Attending: Emergency Medicine

## 2023-12-25 ENCOUNTER — Encounter (HOSPITAL_COMMUNITY): Payer: Self-pay | Admitting: Emergency Medicine

## 2023-12-25 ENCOUNTER — Ambulatory Visit (HOSPITAL_COMMUNITY)
Admission: RE | Admit: 2023-12-25 | Discharge: 2023-12-25 | Disposition: A | Discharge status: Home / Self Care | Attending: Emergency Medicine | Admitting: Emergency Medicine

## 2023-12-25 ENCOUNTER — Other Ambulatory Visit: Payer: Self-pay

## 2023-12-25 ENCOUNTER — Ambulatory Visit (HOSPITAL_COMMUNITY): Payer: Self-pay | Admitting: Vascular Surgery

## 2023-12-25 ENCOUNTER — Ambulatory Visit (HOSPITAL_COMMUNITY)

## 2023-12-25 DIAGNOSIS — Z85048 Personal history of other malignant neoplasm of rectum, rectosigmoid junction, and anus: Secondary | ICD-10-CM | POA: Insufficient documentation

## 2023-12-25 DIAGNOSIS — M1990 Unspecified osteoarthritis, unspecified site: Secondary | ICD-10-CM | POA: Insufficient documentation

## 2023-12-25 DIAGNOSIS — H548 Legal blindness, as defined in USA: Secondary | ICD-10-CM | POA: Insufficient documentation

## 2023-12-25 DIAGNOSIS — Z9221 Personal history of antineoplastic chemotherapy: Secondary | ICD-10-CM | POA: Insufficient documentation

## 2023-12-25 DIAGNOSIS — H4032X0 Glaucoma secondary to eye trauma, left eye, stage unspecified: Secondary | ICD-10-CM | POA: Diagnosis not present

## 2023-12-25 DIAGNOSIS — Z79899 Other long term (current) drug therapy: Secondary | ICD-10-CM | POA: Diagnosis not present

## 2023-12-25 DIAGNOSIS — C3431 Malignant neoplasm of lower lobe, right bronchus or lung: Secondary | ICD-10-CM | POA: Diagnosis not present

## 2023-12-25 DIAGNOSIS — Z86718 Personal history of other venous thrombosis and embolism: Secondary | ICD-10-CM | POA: Diagnosis not present

## 2023-12-25 DIAGNOSIS — Q2112 Patent foramen ovale: Secondary | ICD-10-CM | POA: Insufficient documentation

## 2023-12-25 DIAGNOSIS — Z87891 Personal history of nicotine dependence: Secondary | ICD-10-CM | POA: Insufficient documentation

## 2023-12-25 DIAGNOSIS — C3412 Malignant neoplasm of upper lobe, left bronchus or lung: Secondary | ICD-10-CM | POA: Diagnosis not present

## 2023-12-25 DIAGNOSIS — I10 Essential (primary) hypertension: Secondary | ICD-10-CM | POA: Insufficient documentation

## 2023-12-25 DIAGNOSIS — Z923 Personal history of irradiation: Secondary | ICD-10-CM | POA: Diagnosis not present

## 2023-12-25 DIAGNOSIS — R918 Other nonspecific abnormal finding of lung field: Secondary | ICD-10-CM

## 2023-12-25 DIAGNOSIS — Z01818 Encounter for other preprocedural examination: Secondary | ICD-10-CM

## 2023-12-25 DIAGNOSIS — Z48813 Encounter for surgical aftercare following surgery on the respiratory system: Secondary | ICD-10-CM | POA: Diagnosis not present

## 2023-12-25 HISTORY — PX: VIDEO BRONCHOSCOPY WITH ENDOBRONCHIAL NAVIGATION: SHX6175

## 2023-12-25 HISTORY — DX: Other disorders of iron metabolism: E83.19

## 2023-12-25 LAB — BASIC METABOLIC PANEL WITH GFR
Anion gap: 11 (ref 5–15)
BUN: 15 mg/dL (ref 8–23)
CO2: 23 mmol/L (ref 22–32)
Calcium: 9.2 mg/dL (ref 8.9–10.3)
Chloride: 103 mmol/L (ref 98–111)
Creatinine, Ser: 1.28 mg/dL — ABNORMAL HIGH (ref 0.61–1.24)
GFR, Estimated: >60 mL/min (ref >60)
Glucose, Bld: 83 mg/dL (ref 70–99)
Potassium: 4.1 mmol/L (ref 3.5–5.1)
Sodium: 137 mmol/L (ref 135–145)

## 2023-12-25 LAB — CBC
HCT: 42.9 % (ref 39.0–52.0)
Hemoglobin: 14 g/dL (ref 13.0–17.0)
MCH: 28.3 pg (ref 26.0–34.0)
MCHC: 32.6 g/dL (ref 30.0–36.0)
MCV: 86.8 fL (ref 80.0–100.0)
Platelets: 232 K/uL (ref 150–400)
RBC: 4.94 MIL/uL (ref 4.22–5.81)
RDW: 15.2 % (ref 11.5–15.5)
WBC: 6.1 K/uL (ref 4.0–10.5)
nRBC: 0 % (ref 0.0–0.2)

## 2023-12-25 SURGERY — VIDEO BRONCHOSCOPY WITH ENDOBRONCHIAL NAVIGATION
Anesthesia: General | Laterality: Bilateral

## 2023-12-25 MED ORDER — CHLORHEXIDINE GLUCONATE 0.12 % MT SOLN
OROMUCOSAL | Status: AC
  Administered 2023-12-25: 15 mL via OROMUCOSAL
  Filled 2023-12-25: qty 15

## 2023-12-25 MED ORDER — DEXAMETHASONE SOD PHOSPHATE PF 10 MG/ML IJ SOLN
INTRAMUSCULAR | Status: DC | PRN
  Administered 2023-12-25: 10 mg via INTRAVENOUS

## 2023-12-25 MED ORDER — PROPOFOL 10 MG/ML IV BOLUS
INTRAVENOUS | Status: DC | PRN
Start: 2023-12-25 — End: 2023-12-25
  Administered 2023-12-25: 120 mg via INTRAVENOUS

## 2023-12-25 MED ORDER — FENTANYL CITRATE (PF) 250 MCG/5ML IJ SOLN
INTRAMUSCULAR | Status: DC | PRN
  Administered 2023-12-25: 100 ug via INTRAVENOUS

## 2023-12-25 MED ORDER — PHENYLEPHRINE HCL-NACL 20-0.9 MG/250ML-% IV SOLN
INTRAVENOUS | Status: DC | PRN
  Administered 2023-12-25: 15 ug/min via INTRAVENOUS

## 2023-12-25 MED ORDER — PHENYLEPHRINE 80 MCG/ML (10ML) SYRINGE FOR IV PUSH (FOR BLOOD PRESSURE SUPPORT)
PREFILLED_SYRINGE | INTRAVENOUS | Status: DC | PRN
  Administered 2023-12-25: 160 ug via INTRAVENOUS
  Administered 2023-12-25: 80 ug via INTRAVENOUS

## 2023-12-25 MED ORDER — PROPOFOL 500 MG/50ML IV EMUL: INTRAVENOUS | Status: DC | PRN

## 2023-12-25 MED ORDER — GLYCOPYRROLATE 0.2 MG/ML IJ SOLN
INTRAMUSCULAR | Status: DC | PRN
  Administered 2023-12-25: .1 mg via INTRAVENOUS

## 2023-12-25 MED ORDER — PROPOFOL 500 MG/50ML IV EMUL
INTRAVENOUS | Status: DC | PRN
Start: 2023-12-25 — End: 2023-12-25
  Administered 2023-12-25: 150 ug/kg/min via INTRAVENOUS

## 2023-12-25 MED ORDER — LACTATED RINGERS IV SOLN: INTRAVENOUS | Status: DC

## 2023-12-25 MED ORDER — CHLORHEXIDINE GLUCONATE 0.12 % MT SOLN: 15.0000 mL | Freq: Once | OROMUCOSAL | Status: AC

## 2023-12-25 MED ORDER — FENTANYL CITRATE (PF) 100 MCG/2ML IJ SOLN
INTRAMUSCULAR | Status: AC
  Filled 2023-12-25: qty 2

## 2023-12-25 MED ORDER — ROCURONIUM BROMIDE 10 MG/ML (PF) SYRINGE
PREFILLED_SYRINGE | INTRAVENOUS | Status: DC | PRN
  Administered 2023-12-25: 70 mg via INTRAVENOUS
  Administered 2023-12-25: 20 mg via INTRAVENOUS

## 2023-12-25 MED ORDER — SUGAMMADEX SODIUM 200 MG/2ML IV SOLN
INTRAVENOUS | Status: DC | PRN
  Administered 2023-12-25: 200 mg via INTRAVENOUS

## 2023-12-25 MED ORDER — LIDOCAINE 2% (20 MG/ML) 5 ML SYRINGE
INTRAMUSCULAR | Status: DC | PRN
  Administered 2023-12-25: 60 mg via INTRAVENOUS

## 2023-12-25 MED ORDER — ONDANSETRON HCL 4 MG/2ML IJ SOLN
INTRAMUSCULAR | Status: DC | PRN
  Administered 2023-12-25: 4 mg via INTRAVENOUS

## 2023-12-25 SURGICAL SUPPLY — 36 items
ADAPTER BRONCHOSCOPE OLYMPUS (ADAPTER) ×2 IMPLANT
ADAPTER VALVE BIOPSY EBUS (MISCELLANEOUS) IMPLANT
BAG COUNTER SPONGE SURGICOUNT (BAG) ×2 IMPLANT
BRUSH CYTOL CELLEBRITY 1.5X140 (MISCELLANEOUS) ×2 IMPLANT
BRUSH SUPERTRAX BIOPSY (INSTRUMENTS) IMPLANT
BRUSH SUPERTRAX NDL-TIP CYTO (INSTRUMENTS) ×2 IMPLANT
CANISTER SUCTION 3000ML PPV (SUCTIONS) ×2 IMPLANT
CNTNR URN SCR LID CUP LEK RST (MISCELLANEOUS) ×2 IMPLANT
COVER BACK TABLE 60X90IN (DRAPES) ×2 IMPLANT
FILTER STRAW FLUID ASPIR (MISCELLANEOUS) IMPLANT
FORCEPS BIOP 1.5 SINGLE USE (MISCELLANEOUS) ×2 IMPLANT
FORCEPS BIOP SUPERTRX PREMAR (INSTRUMENTS) ×2 IMPLANT
GAUZE SPONGE 4X4 12PLY STRL (GAUZE/BANDAGES/DRESSINGS) ×2 IMPLANT
GLOVE BIO SURGEON STRL SZ7.5 (GLOVE) ×4 IMPLANT
GOWN STRL REUS W/ TWL LRG LVL3 (GOWN DISPOSABLE) ×4 IMPLANT
KIT CLEAN ENDO COMPLIANCE (KITS) ×2 IMPLANT
KIT LOCATABLE GUIDE (CANNULA) IMPLANT
KIT MARKER FIDUCIAL DELIVERY (KITS) IMPLANT
KIT TURNOVER KIT B (KITS) ×2 IMPLANT
MARKER SKIN DUAL TIP RULER LAB (MISCELLANEOUS) ×2 IMPLANT
NDL SUPERTRX PREMARK BIOPSY (NEEDLE) ×2 IMPLANT
NEEDLE SUPERTRX PREMARK BIOPSY (NEEDLE) ×1 IMPLANT
OIL SILICONE PENTAX (PARTS (SERVICE/REPAIRS)) ×2 IMPLANT
PAD ARMBOARD POSITIONER FOAM (MISCELLANEOUS) ×4 IMPLANT
PATCHES PATIENT (LABEL) ×6 IMPLANT
SOLN 0.9% NACL POUR BTL 1000ML (IV SOLUTION) ×2 IMPLANT
SOLN STERILE WATER BTL 1000 ML (IV SOLUTION) ×2 IMPLANT
SYR 20ML ECCENTRIC (SYRINGE) ×2 IMPLANT
SYR 20ML LL LF (SYRINGE) ×2 IMPLANT
SYR 50ML SLIP (SYRINGE) ×2 IMPLANT
TOWEL GREEN STERILE FF (TOWEL DISPOSABLE) ×2 IMPLANT
TRAP SPECIMEN MUCUS 40CC (MISCELLANEOUS) IMPLANT
TUBE CONNECTING 20X1/4 (TUBING) ×2 IMPLANT
UNDERPAD 30X36 HEAVY ABSORB (UNDERPADS AND DIAPERS) ×2 IMPLANT
VALVE BIOPSY SINGLE USE (MISCELLANEOUS) ×2 IMPLANT
VALVE SUCTION BRONCHIO DISP (MISCELLANEOUS) ×2 IMPLANT

## 2023-12-25 NOTE — Transfer of Care (Signed)
 Immediate Anesthesia Transfer of Care Note  Patient: Juan David  Procedure(s) Performed: VIDEO BRONCHOSCOPY WITH ENDOBRONCHIAL NAVIGATION (Bilateral)  Patient Location: Endoscopy Unit  Anesthesia Type:General  Level of Consciousness: drowsy  Airway & Oxygen  Therapy: Patient Spontanous Breathing  Post-op Assessment: Report given to RN and Post -op Vital signs reviewed and stable  Post vital signs: Reviewed and stable  Last Vitals:  Vitals Value Taken Time  BP 112/74 12/25/23 14:45  Temp 36.7 C 12/25/23 14:45  Pulse 87 12/25/23 14:47  Resp 20 12/25/23 14:47  SpO2 97 % 12/25/23 14:47  Vitals shown include unfiled device data.  Last Pain:  Vitals:   12/25/23 1445  TempSrc: Temporal  PainSc: 0-No pain         Complications: No notable events documented.

## 2023-12-25 NOTE — Interval H&P Note (Signed)
 History and Physical Interval Note:  12/25/2023 11:01 AM  Juan David  has presented today for surgery, with the diagnosis of lung mass, bilateral lung ndules concerning for metastasis.  The various methods of treatment have been discussed with the patient and family. After consideration of risks, benefits and other options for treatment, the patient has consented to  Procedure(s): VIDEO BRONCHOSCOPY WITH ENDOBRONCHIAL NAVIGATION (Bilateral) as a surgical intervention.  The patient's history has been reviewed, patient examined, no change in status, stable for surgery.  I have reviewed the patient's chart and labs.  Questions were answered to the patient's satisfaction.     Lamar GORMAN Chris

## 2023-12-25 NOTE — Anesthesia Postprocedure Evaluation (Signed)
 Anesthesia Post Note  Patient: Juan David  Procedure(s) Performed: VIDEO BRONCHOSCOPY WITH ENDOBRONCHIAL NAVIGATION (Bilateral)     Patient location during evaluation: PACU Anesthesia Type: General Level of consciousness: awake and alert and oriented Pain management: pain level controlled Vital Signs Assessment: post-procedure vital signs reviewed and stable Respiratory status: spontaneous breathing, nonlabored ventilation and respiratory function stable Cardiovascular status: blood pressure returned to baseline and stable Postop Assessment: no apparent nausea or vomiting Anesthetic complications: no   No notable events documented.  Last Vitals:  Vitals:   12/25/23 1520 12/25/23 1530  BP: 115/84 (!) 129/92  Pulse: 81 72  Resp: 15 10  Temp:    SpO2: 97% 97%    Last Pain:  Vitals:   12/25/23 1530  TempSrc:   PainSc: (P) 0-No pain   Pain Goal:                   Devanny Palecek A.

## 2023-12-25 NOTE — Anesthesia Procedure Notes (Addendum)
 Procedure Name: Intubation Date/Time: 12/25/2023 1:26 PM  Performed by: Elby Raelene SAUNDERS, CRNAPre-anesthesia Checklist: Patient identified, Emergency Drugs available, Suction available and Patient being monitored Patient Re-evaluated:Patient Re-evaluated prior to induction Oxygen  Delivery Method: Circle system utilized Preoxygenation: Pre-oxygenation with 100% oxygen  Induction Type: IV induction Ventilation: Mask ventilation without difficulty Laryngoscope Size: Glidescope and 4 Grade View: Grade I Tube type: Oral Tube size: 8.5 mm Number of attempts: 1 Airway Equipment and Method: Stylet and Oral airway Placement Confirmation: ETT inserted through vocal cords under direct vision, positive ETCO2 and breath sounds checked- equal and bilateral Secured at: 21 cm Tube secured with: Tape Dental Injury: Teeth and Oropharynx as per pre-operative assessment

## 2023-12-25 NOTE — Discharge Instructions (Signed)

## 2023-12-25 NOTE — Op Note (Signed)
 Video Bronchoscopy with Robotic Assisted Bronchoscopic Navigation   Date of Operation: 12/25/2023   Pre-op Diagnosis: Left upper lobe mass, bilateral pulmonary nodules  Post-op Diagnosis: Same  Surgeon: Lamar Chris  Assistants: Praveen Mannam  Anesthesia: General endotracheal anesthesia  Operation: Flexible video fiberoptic bronchoscopy with robotic assistance and biopsies.  Estimated Blood Loss: Minimal  Complications: None  Indications and History: Juan David is a 68 y.o. male with history of colon cancer.  He was found to have new chest lesions on surveillance imaging including a left upper lobe mass and bilateral nodules.  Recommendation made to achieve a tissue diagnosis via robotic assisted navigational bronchoscopy.  The risks, benefits, complications, treatment options and expected outcomes were discussed with the patient.  The possibilities of pneumothorax, pneumonia, reaction to medication, pulmonary aspiration, perforation of a viscus, bleeding, failure to diagnose a condition and creating a complication requiring transfusion or operation were discussed with the patient who freely signed the consent.    Description of Procedure: The patient was seen in the Preoperative Area, was examined and was deemed appropriate to proceed.  The patient was taken to [facility] Endoscopy room 3, identified as Juan David and the procedure verified as Flexible Video Fiberoptic Bronchoscopy.  A Time Out was held and the above information confirmed.   Prior to the date of the procedure a high-resolution CT scan of the chest was performed. Utilizing ION software program a virtual tracheobronchial tree was generated to allow the creation of distinct navigation pathways to the patient's parenchymal abnormalities. After being taken to the operating room general anesthesia was initiated and the patient  was orally intubated. The video fiberoptic bronchoscope was introduced via the endotracheal tube  and a general inspection was performed which showed normal right and left lung anatomy. Aspiration of the bilateral mainstems was completed to remove any remaining secretions. Robotic catheter inserted into patient's endotracheal tube.   Target #1 left upper lobe mass: The distinct navigation pathways prepared prior to this procedure were then utilized to navigate to patient's lesion identified on CT scan. The robotic catheter was secured into place and the vision probe was withdrawn.  Lesion location was approximated using fluoroscopy.  The mass was localized and catheter orientation confirmed using radial EBUS by Dr. Theophilus. Under fluoroscopic guidance transbronchial needle brushings, transbronchial needle biopsies, and transbronchial cryoprobe biopsies were performed to be sent for cytology and pathology.    Target #2 right lower lobe nodule: The distinct navigation pathways prepared prior to this procedure were then utilized to navigate to patient's lesion identified on CT scan. The robotic catheter was secured into place and the vision probe was withdrawn.  Lesion location was approximated using fluoroscopy.  Local registration and targeting was performed using Siem[employer] Cios mobile C-arm three-dimensional imaging. Under fluoroscopic guidance transbronchial needle biopsies and transbronchial cryoprobe biopsies were performed to be sent for cytology and pathology.  Needle-in-lesion was confirmed using Cios mobile C-arm.    At the end of the procedure a general airway inspection was performed and there was no evidence of active bleeding. The bronchoscope was removed.  The patient tolerated the procedure well. There was no significant blood loss and there were no obvious complications. A post-procedural chest x-ray is pending.  Samples Target #1: 1. Transbronchial needle brushings from left upper lobe mass 2. Transbronchial Wang needle biopsies from left upper lobe mass 3. Transbronchial  cryoprobe biopsies from left upper lobe mass   Samples Target #2: 1. Transbronchial Wang needle biopsies from right  lower lobe nodule 2. Transbronchial forceps cryoprobe from right lower lobe nodule   Plans:  The patient will be discharged from the PACU to home when recovered from anesthesia and after chest x-ray is reviewed. We will review the cytology, pathology and microbiology results with the patient when they become available. Outpatient followup will be with Dr. Annella and Juan Lites, NP.  He will also follow-up with Dr. Lanny.    Lamar Chris, MD, PhD 12/25/2023, 2:44 PM [facility] [phone] or if no answer before 7:00PM call [phone] For any issues after 7:00PM please call eLink [phone]

## 2023-12-27 ENCOUNTER — Encounter (HOSPITAL_COMMUNITY): Payer: Self-pay | Admitting: Emergency Medicine

## 2023-12-28 ENCOUNTER — Telehealth: Admitting: Hematology

## 2023-12-28 LAB — CYTOLOGY - NON PAP

## 2023-12-29 ENCOUNTER — Other Ambulatory Visit: Payer: Self-pay

## 2023-12-29 LAB — CYTOLOGY - NON PAP

## 2023-12-31 DIAGNOSIS — C3492 Malignant neoplasm of unspecified part of left bronchus or lung: Secondary | ICD-10-CM | POA: Insufficient documentation

## 2023-12-31 NOTE — Assessment & Plan Note (Signed)
 Stage IV with bilateral pulmonary metastasis -Incidental finding on image for hemochromatosis evaluation.  She is a light smoker. - CT 12/04/2013 scan showed 3.9 cm mass in the left upper lobe lung with numerous satellite nodules, and bilateral subcentimeter lung nodules.  -PET scan with hypermetabolic left upper lobe lung mass, no other distant metastasis and small lung nodules are not hypermetabolic on PET. -Patient underwent bronchoscopy and a biopsy on December 25, 2023, the biopsy of the left upper lung mass and right lower lobe lung nodule both showed adenocarcinoma, TTF-1 positive, CDX2 negative, supporting lung primary. -Will obtain molecular testing and PD-L1 testing to systemic therapy.

## 2023-12-31 NOTE — Assessment & Plan Note (Signed)
-  diagnosed in 08/2016, pT2N0 --resection on 09/01/16 by Dr. Thomas, surgical path reviewed a 1.3 cm adenocarcinoma with invasion into anal sphincter muscle.  -Pelvic MRI 09/22/16 showed no residual tumor and no adenopathy.  -Patient completed adjuvant concurrent chemo and radiation 10/10/16-11/22/16, tolerated well overall. -repeated colonoscopy in 03/2021 showed Stricture in the distal sigmoid colon, scope was not able to pass, not malignant appearing, not biopsied -He is clinically doing well, lab reviewed, exam was unremarkable, there is no clinical concern for recurrence. -He has completed 5 years of surveillance.  

## 2024-01-01 ENCOUNTER — Other Ambulatory Visit: Payer: Self-pay

## 2024-01-01 ENCOUNTER — Ambulatory Visit: Admitting: Acute Care

## 2024-01-01 ENCOUNTER — Encounter: Payer: Self-pay | Admitting: Hematology

## 2024-01-01 ENCOUNTER — Inpatient Hospital Stay: Attending: Hematology | Admitting: Hematology

## 2024-01-01 DIAGNOSIS — C3492 Malignant neoplasm of unspecified part of left bronchus or lung: Secondary | ICD-10-CM

## 2024-01-01 DIAGNOSIS — Z85048 Personal history of other malignant neoplasm of rectum, rectosigmoid junction, and anus: Secondary | ICD-10-CM | POA: Diagnosis not present

## 2024-01-01 DIAGNOSIS — C3412 Malignant neoplasm of upper lobe, left bronchus or lung: Secondary | ICD-10-CM | POA: Diagnosis not present

## 2024-01-01 DIAGNOSIS — Z923 Personal history of irradiation: Secondary | ICD-10-CM | POA: Diagnosis not present

## 2024-01-01 DIAGNOSIS — C20 Malignant neoplasm of rectum: Secondary | ICD-10-CM | POA: Diagnosis not present

## 2024-01-01 DIAGNOSIS — Z9221 Personal history of antineoplastic chemotherapy: Secondary | ICD-10-CM | POA: Diagnosis not present

## 2024-01-01 DIAGNOSIS — C3490 Malignant neoplasm of unspecified part of unspecified bronchus or lung: Secondary | ICD-10-CM

## 2024-01-01 NOTE — Progress Notes (Signed)
START ON PATHWAY REGIMEN - Non-Small Cell Lung     Cycles 1 through up to 6: A cycle is every 21 days:     Bevacizumab-xxxx      Pemetrexed      Carboplatin   **Always confirm dose/schedule in your pharmacy ordering system**  Patient Characteristics: Stage IV Metastatic, Nonsquamous, Awaiting Molecular Test Results and Need to Start Chemotherapy, PS = 0, 1 Therapeutic Status: Stage IV Metastatic Histology: Nonsquamous Cell Broad Molecular Profiling Status: Awaiting Molecular Test Results and Need to Start Chemotherapy ECOG Performance Status: 0 Intent of Therapy: Non-Curative / Palliative Intent, Discussed with Patient

## 2024-01-01 NOTE — Progress Notes (Signed)
 [facility]   [phone] Fax:([phone]) [phone]   Clinic Follow up Note   Patient Care Team: [name], MD as PCP - General (Family Medicine) [name], MD as Consulting Physician (General Surgery) [name], MD as Consulting Physician (Radiation Oncology) [name], MD as Consulting Physician (Hematology) [name], MD as Consulting Physician (Gastroenterology) [name], MD as Consulting Physician (General Surgery) 01/01/2024  I connected with Juan David on 01/01/24 at  8:20 AM EDT by telephone and verified that I am speaking with the correct person using two identifiers.   I discussed the limitations, risks, security and privacy concerns of performing an evaluation and management service by telephone and the availability of in person appointments. I also discussed with the patient that there may be a patient responsible charge related to this service. The patient expressed understanding and agreed to proceed.   Patient's location:  Home  Provider's location:  Office    CHIEF COMPLAINT: f/u scan and biopsy results    CURRENT THERAPY: pending   Oncology history Adenocarcinoma of rectum (HCC) -diagnosed in 08/2016, pT2N0 --resection on 09/01/16 by Dr. Debby, surgical path reviewed a 1.3 cm adenocarcinoma with invasion into anal sphincter muscle.  -Pelvic MRI 09/22/16 showed no residual tumor and no adenopathy.  -Patient completed adjuvant concurrent chemo and radiation 10/10/16-11/22/16, tolerated well overall. -repeated colonoscopy in 03/2021 showed Stricture in the distal sigmoid colon, scope was not able to pass, not malignant appearing, not biopsied -He is clinically doing well, lab reviewed, exam was unremarkable, there is no clinical concern for recurrence. -He has completed 5 years of surveillance.   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  She is a light smoker. - CT 12/04/2013 scan showed 3.9 cm mass in the left upper lobe lung with numerous satellite nodules, and bilateral subcentimeter lung nodules.  -PET scan with hypermetabolic left upper lobe lung mass, no other distant metastasis and small lung nodules are not hypermetabolic on PET. -Patient underwent bronchoscopy and a biopsy on December 25, 2023, the biopsy of the left upper lung mass and right lower lobe lung nodule both showed adenocarcinoma, TTF-1 positive, CDX2 negative, supporting lung primary. -Will obtain molecular testing and PD-L1 testing to systemic therapy.   Assessment & Plan Stage IV metastatic lung adenocarcinoma Newly diagnosed stage 4 metastatic lung adenocarcinoma with primary tumor in the left upper lobe and multiple small nodules in both lungs. Biopsy confirmed adenocarcinoma in the primary tumor and contralateral lung nodule. PET scan and brain MRI showed no extrathoracic metastasis.   -Surgery and radiation are not recommended due to the extent of disease. Systemic treatment is the mainstay, with options including chemotherapy, immunotherapy, and targeted therapy. Additional testing is required to determine eligibility for targeted therapy and immunotherapy. Mild smoking history suggests a better chance of finding a targetable mutation. T - If he does not have targetable mutations, I would recommend first-line chemotherapy carboplatin and pemetrexed.  the goal of chemotherapy is disease control, not cure. Chemotherapy may cause alopecia and impact energy levels, but is generally tolerable. Maintenance therapy will follow initial chemotherapy 4 cycles if effective. Prognosis depends on test results and treatment response, with a small chance of long-term survival with immunotherapy and local therapy. -Will also check PD-L1 in his tumor to see if he is a candidate for immunotherapy - Order blood test Guardant 360 for mutation analysis to assess  eligibility for  targeted therapy. - Schedule chemotherapy class for education on treatment and side effects. - Plan to start chemotherapy the week of November 10 if targeted therapy is not an option. - Schedule port placement for chemotherapy administration, to be canceled if targeted therapy is initiated. - Request additional testing on biopsy sample for immunotherapy eligibility. - Schedule follow-up visit next week to review blood test results and finalize treatment plan.  Plan -Guardant 360 today or tomorrow  -will order NGS Tempus including PD-L1 -F/U next week to review Guardant 360 results.  If no targetable mutation, will proceed chemotherapy the following week. - I will schedule port placement, chemo class and first cycle chemo the week of November 10.   SUMMARY OF ONCOLOGIC HISTORY: Oncology History Overview Note  Cancer Staging Rectal adenocarcinoma [facility]) Staging form: Colon and Rectum, AJCC 8th Edition - Pathologic stage from 09/01/2016: Stage I (pT2, pN0, cM0) - Signed by [name], MD on 10/01/2016     Adenocarcinoma of rectum (HCC)  09/01/2016 Initial Diagnosis   Rectal adenocarcinoma (HCC)   09/01/2016 Surgery   Hemorrhoidectomy 09/01/2016. Noted to have a mobile right lateral anal canal mass. Dr. Bernarda Ned.    09/01/2016 Pathology Results   Invasive adenocarcinoma, well-differentiated, spanning 1.3 cm. The tumor invaded into the anal sphincter muscle. Resection margins were negative. The pathologist notes the tumor would be best staged as pT2 given involvement of muscle.    09/06/2016 Imaging   CT scans chest/abdomen/pelvis 09/06/2016 showed no evidence of metastatic disease.    09/22/2016 Imaging   Pelvic MRI 09/22/2016 showed no definite residual anal tumor. There was no evidence of tumor involvement of the external sphincter or adjacent organs. There was no pelvic lymphadenopathy.   10/10/2016 - 11/22/2016 Radiation Therapy   Patient begun adjuvant radiation with  Dr. Dewey   10/10/2016 - 11/22/2016 Chemotherapy   xeloda  1500mg , twice daily.   11/24/2017 Imaging   CT CAP W contrast 11/24/17  IMPRESSION: 1. Stable exam. No new or progressive interval findings to suggest metastatic disease. 2.  Aortic Atherosclerois (ICD10-170.0) 3.  Emphysema. (PRI89-G56.9)    NSCLC of left lung (HCC)  12/25/2023 Cancer Staging   Staging form: Lung, AJCC V9 - Clinical stage from 12/25/2023: Stage IVA (cT3, cN0, cM1a) - Signed by [name], MD on 12/31/2023   12/31/2023 Initial Diagnosis   NSCLC of left lung (HCC)   01/17/2024 -  Chemotherapy   Patient is on Treatment Plan : LUNG Carboplatin (5) + Pemetrexed (500) + Pembrolizumab (200) D1 q21d Induction x 4 cycles / Maintenance Pemetrexed (500) + Pembrolizumab (200) D1 q21d       Discussed the use of AI scribe software for clinical note transcription with the patient, who gave verbal consent to proceed.  History of Present Illness Juan David is a 68 year old male with newly diagnosed metastatic lung cancer who presents for a phone visit to review lung biopsy results. He is accompanied by his wife, [name], during the phone visit.  He recently underwent a bronchoscopy and biopsy, which confirmed adenocarcinoma in both the main lesion in the left lung and a small lesion in the right lung. A PET scan showed a large mass in the left upper lobe and multiple small nodules in both lungs.  He has a history of smoking cigars for approximately forty years, at a rate of two cigars per day, but has since quit. He denies cigarette use.  No fever or other systemic symptoms are present.  REVIEW OF SYSTEMS:   Constitutional: Denies fevers, chills or abnormal weight loss Eyes: Denies blurriness of vision Ears, nose, mouth, throat, and face: Denies mucositis or sore throat Respiratory: Denies cough, dyspnea or wheezes Cardiovascular: Denies palpitation, chest discomfort or lower extremity swelling Gastrointestinal:   Denies nausea, heartburn or change in bowel habits Skin: Denies abnormal skin rashes Lymphatics: Denies new lymphadenopathy or easy bruising Neurological:Denies numbness, tingling or new weaknesses Behavioral/Psych: Mood is stable, no new changes  All other systems were reviewed with the patient and are negative.  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   Traumatic glaucoma, 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 at [facility] in [place]   08-18-2001   Wears glasses     SURGICAL HISTORY: Past Surgical History:  Procedure Laterality Date   ARTHROSCOPIC REPAIR ACL Right 1998   BIOPSY  03/16/2021   Procedure: BIOPSY;  Surgeon: [name], MD;  Location: [facility];  Service: Endoscopy;;   ESOPHAGOGASTRODUODENOSCOPY (EGD) WITH PROPOFOL  N/A 03/16/2021   Procedure: ESOPHAGOGASTRODUODENOSCOPY (EGD) WITH PROPOFOL ;  Surgeon: [name], MD;  Location: [facility];  Service: Endoscopy;  Laterality: N/A;   EXPLORATION AND REPAIR LEFT EYE INJURY  08-18-2001   [place]   ruptured globe- repair corneal laceration, reposition of prolapsed uveal tissue   HEMORRHOID SURGERY N/A 09/01/2016   Procedure: HEMORRHOIDECTOMY;  Surgeon: [name], MD;  Location: [facility];  Service: General;  Laterality: N/A;   JOINT REPLACEMENT Right    knee   LAPAROTOMY N/A 12/04/2020    Procedure: EXPLORATORY LAPAROTOMY small bowel resection;  Surgeon: [name], MD;  Location: [facility] OR;  Service: General;  Laterality: N/A;   SUPERFICIAL KERATECTOMY Left 06-29-2011    [facility]    with EDTA scrub of left eye   TEE WITHOUT CARDIOVERSION  05-11-2009  dr vina gull   LVSEF 55-65%/  mild thickened AV without AI/  trace MR/ mixed fixed artherosclerosis plaqueing thoracic aorta/  no evidence thrombus/  by agitated saling and color doppler there was a PFO   VIDEO BRONCHOSCOPY WITH ENDOBRONCHIAL NAVIGATION Bilateral 12/25/2023   Procedure: VIDEO BRONCHOSCOPY WITH ENDOBRONCHIAL NAVIGATION;  Surgeon: Shelah Lamar RAMAN, MD;  Location: [facility];  Service: Pulmonary;  Laterality: Bilateral;    I have reviewed the social history and family history with the patient and they are unchanged from previous note.  ALLERGIES:  has no known allergies.  MEDICATIONS:  Current Outpatient Medications  Medication Sig Dispense Refill   acetaminophen  (TYLENOL ) 500 MG tablet Take 2 tablets (1,000 mg total) by mouth every 6 (six) hours as needed. 30 tablet 0   Deferasirox  180 MG TABS Take 4 tablets (720 mg total) by mouth daily. Increase tablets  up to goal dose as instructed by MD. Take on an empty stomach at least 30 minutes before a meal. 120 tablet 2   hydrochlorothiazide  (HYDRODIURIL ) 25 MG tablet Take 1 tablet (25 mg total) by mouth daily for hypertension. 90 tablet 1   moxifloxacin  (VIGAMOX ) 0.5 % ophthalmic solution Place 1 drop into the left eye 4 (four) times daily for 10 days. 5 mL 5   Multiple Vitamin (MULTIVITAMIN WITH MINERALS) TABS tablet Take 1 tablet by mouth in the morning.     ondansetron  (ZOFRAN ) 8 MG tablet Take 1 tablet (8 mg total) by mouth every 8 (eight) hours as needed for nausea or vomiting. 10 tablet 0   polyethylene glycol (MIRALAX / GLYCOLAX) 17 g packet Take 17 g by mouth in the morning.     timolol  (TIMOPTIC ) 0.5 % ophthalmic solution Place 1 drop into the left eye daily.  10 mL 11   timolol  (TIMOPTIC ) 0.5 % ophthalmic solution Place 1 drop into the left eye every morning. 10 mL 11   No current facility-administered medications for this visit.    PHYSICAL EXAMINATION: Not performed   LABORATORY DATA:  I have reviewed the data as listed    Latest Ref Rng & Units 12/25/2023   10:27 AM 11/07/2023   12:14 PM 09/12/2023    9:02 AM  CBC  WBC 4.0 - 10.5 K/uL 6.1  9.1  5.2   Hemoglobin 13.0 - 17.0 g/dL 85.9  87.3  86.3   Hematocrit 39.0 - 52.0 % 42.9  38.1  39.3   Platelets 150 - 400 K/uL 232  278  196         Latest Ref Rng & Units 12/25/2023   12:08 PM 08/03/2023    9:12 AM 08/23/2022   12:26 PM  CMP  Glucose 70 - 99 mg/dL 83  98  873   BUN 8 - 23 mg/dL 15  20  15    Creatinine 0.61 - 1.24 mg/dL 8.71  8.93  9.05   Sodium 135 - 145 mmol/L 137  140  137   Potassium 3.5 - 5.1 mmol/L 4.1  4.4  3.6   Chloride 98 - 111 mmol/L 103  106  104   CO2 22 - 32 mmol/L 23  27  29    Calcium 8.9 - 10.3 mg/dL 9.2  9.8  9.3   Total Protein 6.5 - 8.1 g/dL  7.7  6.8   Total Bilirubin 0.0 - 1.2 mg/dL  0.4  0.5   Alkaline Phos 38 - 126 U/L  102  89   AST 15 - 41 U/L  19  16   ALT 0 - 44 U/L  14  11       RADIOGRAPHIC STUDIES: I have personally reviewed the radiological images as listed and agreed with the findings in the report. No results found.     I discussed the assessment and treatment plan with the patient. The patient was provided an opportunity to ask questions and all were answered. The patient agreed with the plan and demonstrated an understanding of the instructions.   The patient was advised to call back or seek an in-person evaluation if the symptoms worsen or if the condition fails to improve as anticipated.  I provided 40 minutes of non face-to-face telephone visit time during this encounter, including review of chart and various tests results, discussions about plan of care and coordination of care plan.    Onita Mattock, MD 01/01/24

## 2024-01-01 NOTE — Progress Notes (Signed)
 Verbal order w/readback order from Dr. Lanny for Tempus & Guardant 360 to be drawn on 01/02/2024.  Order placed in EPIC and in [employer] Guardant portal.  Tempus kt and both Guardant & Tempus requisitions given to [facility] Lab Receptionist.  Lab appt made with pt and spouse.  Pt confirmed appt date and time on call.

## 2024-01-02 ENCOUNTER — Other Ambulatory Visit: Payer: Self-pay

## 2024-01-02 ENCOUNTER — Inpatient Hospital Stay

## 2024-01-02 DIAGNOSIS — C3490 Malignant neoplasm of unspecified part of unspecified bronchus or lung: Secondary | ICD-10-CM

## 2024-01-02 DIAGNOSIS — C3492 Malignant neoplasm of unspecified part of left bronchus or lung: Secondary | ICD-10-CM

## 2024-01-02 LAB — MISCELLANEOUS TEST

## 2024-01-03 ENCOUNTER — Encounter (HOSPITAL_BASED_OUTPATIENT_CLINIC_OR_DEPARTMENT_OTHER): Payer: Self-pay

## 2024-01-03 ENCOUNTER — Ambulatory Visit (HOSPITAL_BASED_OUTPATIENT_CLINIC_OR_DEPARTMENT_OTHER)

## 2024-01-03 ENCOUNTER — Ambulatory Visit (INDEPENDENT_AMBULATORY_CARE_PROVIDER_SITE_OTHER)

## 2024-01-03 ENCOUNTER — Ambulatory Visit: Payer: Self-pay | Admitting: Emergency Medicine

## 2024-01-03 VITALS — BP 166/88 | HR 61 | Ht 71.0 in | Wt 124.0 lb

## 2024-01-03 DIAGNOSIS — R042 Hemoptysis: Secondary | ICD-10-CM | POA: Diagnosis not present

## 2024-01-03 DIAGNOSIS — C3492 Malignant neoplasm of unspecified part of left bronchus or lung: Secondary | ICD-10-CM | POA: Diagnosis not present

## 2024-01-03 NOTE — Telephone Encounter (Signed)
 FYI Only or Action Required?: FYI only for provider: appointment scheduled on today.  Patient is followed in Pulmonology for lung nodules, last seen on 12/13/2023 by Hunsucker, Donnice SAUNDERS, MD.  Called Nurse Triage reporting Hemoptysis.  Symptoms began several days ago.  Symptoms are: unchanged.  Triage Disposition: See HCP Within 4 Hours (Or PCP Triage)  Patient/caregiver understands and will follow disposition?: Yes      Copied from CRM [id number]. Topic: Clinical - Red Word Triage >> Jan 03, 2024 10:35 AM [name] wrote: Red Word that prompted transfer to Nurse Triage: Patient's wife [name] [phone] states patient had bronchoscopy 12/25/23 and is coughing up a spoonful of blood still, shortness of breath has not changed, no wheezing, nor pain just throat. Lost connection with [name], system went down. Informed [name], will call back.      Reason for Disposition  [1] Coughed up blood AND [2] > 1 tablespoon (15 ml)  Answer Assessment - Initial Assessment Questions 1. ONSET: When did the cough begin?      9 days ago 2. SEVERITY: How bad is the cough today? Did the blood appear after a coughing spell?      Mild  3. SPUTUM: Describe the color of your sputum (e.g., none, dry cough; clear, white, yellow, green)     No 4. HEMOPTYSIS: How much blood? (e.g., flecks, streaks, tablespoons)     About a tablespoon several times a day  5. DIFFICULTY BREATHING: Are you having difficulty breathing? If Yes, ask: How bad is it? (e.g., mild, moderate, severe)      No 6. FEVER: Do you have a fever? If Yes, ask: What is your temperature, how was it measured, and when did it start?     No 7. CARDIAC HISTORY: Do you have any history of heart disease? (e.g., heart attack, congestive heart failure)      No 8. LUNG HISTORY: Do you have any history of lung disease?  (e.g., pulmonary embolus, asthma, emphysema)     Pulmonary nodules  9. PE RISK FACTORS: Do you have a history  of blood clots? Note: Other risk factors include recent major surgery, recent prolonged travel, being bedridden.     Bronchoscopy on 12/25/23 10. OTHER SYMPTOMS: Do you have any other symptoms? (e.g., runny nose, wheezing, chest pain)       No  Protocols used: Coughing Up Blood-A-AH

## 2024-01-03 NOTE — Progress Notes (Signed)
 @Patient  ID: Juan David, male    DOB: September 01, 1955, 68 y.o.   MRN: [id number]  Chief Complaint  Patient presents with   Acute Visit    Referring provider: [name], *  HPI: Discussed the use of AI scribe software for clinical note transcription with the patient, who gave verbal consent to proceed.  History of Present Illness Juan David is a 68 year old male who presents with intermittent hemoptysis following a bronchoscopy. He is accompanied by his wife, [name].  He underwent a bronchoscopy nine days ago and has since experienced intermittent hemoptysis, coughing up approximately a tablespoon of bright red blood mixed with phlegm about twice a day. The episodes are sporadic, with some days having no occurrences and other days having multiple episodes.  No associated symptoms such as increased shortness of breath, wheezing, chest pain, fever, or chills. There have been no changes in his activities due to shortness of breath. He mentions a slight scratchiness in his throat over the past day, but no significant sore throat or pain.  He is not on any medications that would increase bleeding risk, such as aspirin or blood thinners. No dizziness or lightheadedness.   TEST/EVENTS :   Oncology history Adenocarcinoma of rectum (HCC) -diagnosed in 08/2016, pT2N0 --resection on 09/01/16 by Dr. Debby, surgical path reviewed a 1.3 cm adenocarcinoma with invasion into anal sphincter muscle.  -Pelvic MRI 09/22/16 showed no residual tumor and no adenopathy.  -Patient completed adjuvant concurrent chemo and radiation 10/10/16-11/22/16, tolerated well overall. -repeated colonoscopy in 03/2021 showed Stricture in the distal sigmoid colon, scope was not able to pass, not malignant appearing, not biopsied -He is clinically doing well, lab reviewed, exam was unremarkable, there is no clinical concern for recurrence. -He has completed 5 years of surveillance.    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  She is a light smoker. - CT 12/04/2013 scan showed 3.9 cm mass in the left upper lobe lung with numerous satellite nodules, and bilateral subcentimeter lung nodules.  -PET scan with hypermetabolic left upper lobe lung mass, no other distant metastasis and small lung nodules are not hypermetabolic on PET. -Patient underwent bronchoscopy and a biopsy on December 25, 2023, the biopsy of the left upper lung mass and right lower lobe lung nodule both showed adenocarcinoma, TTF-1 positive, CDX2 negative, supporting lung primary. -Will obtain molecular testing and PD-L1 testing to systemic therapy.  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   Traumatic glaucoma, 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 jansky donell  at [facility] in [place]   08-18-2001   Wears glasses     Tobacco History: Social History   Tobacco Use  Smoking Status Former   Types: Cigars   Quit date: 12/06/2023   Years since quitting: 0.0  Smokeless Tobacco Former   Quit date: 08/25/2008  Tobacco Comments   per pt former smoker of small cigar x2 daily (average) previous smoked cigeratte until 2010   Pt  stopped smoking 12/2023 was smoking 1 black and mild a day   Counseling given: Not Answered Tobacco comments: per pt former smoker of small cigar x2 daily (average) previous smoked cigeratte until 2010 Pt stopped smoking 12/2023 was smoking 1 black and mild a day   Outpatient Medications Prior to Visit  Medication Sig Dispense Refill   acetaminophen  (TYLENOL ) 500 MG tablet Take 2 tablets (1,000 mg total) by mouth every 6 (six) hours as needed. 30 tablet 0   Deferasirox  180 MG TABS Take 4 tablets (720 mg total) by mouth daily. Increase tablets up to goal dose as instructed by MD. Take on an empty stomach at least 30 minutes before a meal. 120 tablet 2   hydrochlorothiazide  (HYDRODIURIL ) 25 MG tablet Take 1 tablet (25 mg total) by mouth daily for hypertension. 90 tablet 1   moxifloxacin  (VIGAMOX ) 0.5 % ophthalmic solution Place 1 drop into the left eye 4 (four) times daily for 10 days. 5 mL 5   Multiple Vitamin (MULTIVITAMIN WITH MINERALS) TABS tablet Take 1 tablet by mouth in the morning.     ondansetron  (ZOFRAN ) 8 MG tablet Take 1 tablet (8 mg total) by mouth every 8 (eight) hours as needed for nausea or vomiting. 10 tablet 0   polyethylene glycol (MIRALAX / GLYCOLAX) 17 g packet Take 17 g by mouth in the morning.     timolol  (TIMOPTIC ) 0.5 % ophthalmic solution Place 1 drop into the left eye daily. 10 mL 11   timolol  (TIMOPTIC ) 0.5 % ophthalmic solution Place 1 drop into the left eye every morning. 10 mL 11   No facility-administered medications prior to visit.     Review of Systems: positive as per HPI  Constitutional:   No  weight loss, night sweats,  Fevers, chills, fatigue, or  lassitude.  HEENT:   No headaches,  Difficulty swallowing,  Tooth/dental problems, or  Sore throat,                No sneezing, itching, ear ache, nasal congestion, post nasal drip,   CV:  No chest pain,  Orthopnea, PND, swelling in lower extremities, anasarca, dizziness, palpitations, syncope.   GI  No  heartburn, indigestion, abdominal pain, nausea, vomiting, diarrhea, change in bowel habits, loss of appetite, bloody stools.   Resp: No shortness of breath with exertion or at rest.  No excess mucus, no productive cough,  No non-productive cough,  No coughing up of blood.  No change in color of mucus.  No wheezing.  No chest wall deformity  Skin: no rash or lesions.  GU: no dysuria, change in color of urine, no urgency or frequency.  No flank pain, no hematuria   MS:  No joint pain or swelling.  No decreased range of motion.  No back pain.    Physical Exam  BP (!) 166/88   Pulse 61   Ht 5' 11 (1.803 m)   Wt 124 lb (56.2 kg)   BMI 17.29 kg/m   GEN: A/Ox3; pleasant , NAD, thin   HEENT:  [place]/AT,  EACs-clear, TMs-wnl, NOSE-clear, THROAT-clear, no lesions, no postnasal drip or exudate noted.   NECK:  Supple w/ fair ROM; no JVD; normal carotid impulses w/o bruits; no thyromegaly or nodules palpated; no lymphadenopathy.    RESP  Clear  P & A; w/o, wheezes/ rales/ or rhonchi. no accessory muscle use, no dullness to percussion  CARD:  RRR, no m/r/g, no peripheral edema, pulses intact, no cyanosis or clubbing.  GI:   Soft & nt; nml bowel sounds; no organomegaly or masses detected.   Musco: Warm bil, no deformities or joint swelling noted.   Neuro: alert, no focal deficits noted.    Skin: Warm, no lesions or rashes    Lab Results:  CBC    Component Value Date/Time   WBC 6.1 12/25/2023 1027   RBC 4.94 12/25/2023 1027   HGB 14.0 12/25/2023 1027   HGB 12.6 (L) 11/07/2023 1214   HGB 12.6 (L) 12/27/2016 1237   HCT 42.9 12/25/2023 1027   HCT 38.0 (L) 12/27/2016 1237   PLT 232 12/25/2023 1027   PLT 278 11/07/2023 1214   PLT 268 12/27/2016 1237   MCV 86.8 12/25/2023 1027   MCV 92.0 12/27/2016 1237   MCH 28.3 12/25/2023 1027   MCHC 32.6 12/25/2023 1027   RDW 15.2 12/25/2023 1027   RDW 16.5 (H) 12/27/2016 1237   LYMPHSABS 1.9 11/07/2023 1214   LYMPHSABS 0.6 (L) 12/27/2016  1237   MONOABS 0.8 11/07/2023 1214   MONOABS 0.5 12/27/2016 1237   EOSABS 0.2 11/07/2023 1214   EOSABS 0.1 12/27/2016 1237   BASOSABS 0.0 11/07/2023 1214   BASOSABS 0.0 12/27/2016 1237    BMET    Component Value Date/Time   NA 137 12/25/2023 1208   NA 139 12/27/2016 1237   K 4.1 12/25/2023 1208   K 4.2 12/27/2016 1237   CL 103 12/25/2023 1208   CO2 23 12/25/2023 1208   CO2 25 12/27/2016 1237   GLUCOSE 83 12/25/2023 1208   GLUCOSE 112 12/27/2016 1237   BUN 15 12/25/2023 1208   BUN 15.2 12/27/2016 1237   CREATININE 1.28 (H) 12/25/2023 1208   CREATININE 1.06 08/03/2023 0912   CREATININE 1.1 12/27/2016 1237   CALCIUM 9.2 12/25/2023 1208   CALCIUM 9.7 12/27/2016 1237   GFRNONAA >60 12/25/2023 1208   GFRNONAA >60 08/03/2023 0912   GFRAA >60 11/20/2017 0948    BNP No results found for: BNP  ProBNP No results found for: PROBNP  Imaging: DG Chest Port 1 View Result Date: 12/25/2023 CLINICAL DATA:  Status post bronchoscopy and biopsy, known left upper lobe mass EXAM: PORTABLE CHEST 1 VIEW COMPARISON:  12/15/2023 FINDINGS: Two frontal views of the chest demonstrate stable lobular left upper lobe mass corresponding to the metabolically active lesion on recent PET scan, suggestive of bronchogenic malignancy. The smaller nodule seen elsewhere within the left upper and left lower lobe on recent PET scan are not as well visualized by x-ray. No acute airspace disease, effusion, or pneumothorax. Cardiac silhouette is stable, with persistent ectasia of the thoracic aorta. There are no acute bony abnormalities. IMPRESSION: 1. Stable left upper lobe mass concerning for bronchogenic malignancy. 2. No evidence of complication after biopsy.  No pneumothorax. Electronically Signed   By: Ozell Daring M.D.   On: 12/25/2023 15:27   DG C-ARM BRONCHOSCOPY Result Date: 12/25/2023 C-ARM BRONCHOSCOPY: Fluoroscopy was utilized by the requesting physician.  No radiographic interpretation.   DG  C-Arm 1-60 Min-No Report Result Date: 12/25/2023 Fluoroscopy was utilized by the  requesting physician.  No radiographic interpretation.   NM PET Image Initial (PI) Skull Base To Thigh Result Date: 12/18/2023 CLINICAL DATA:  Initial treatment strategy for lung nodule. EXAM: NUCLEAR MEDICINE PET SKULL BASE TO THIGH TECHNIQUE: 6.1 mCi F-18 FDG was injected intravenously. Full-ring PET imaging was performed from the skull base to thigh after the radiotracer. CT data was obtained and used for attenuation correction and anatomic localization. Fasting blood glucose: 92 mg/dl COMPARISON:  CT chest 90/75/7974, MR abdomen 11/07/2023, CT abdomen pelvis 03/23/2021. FINDINGS: Mediastinal blood pool activity: SUV max 2.0 Liver activity: SUV max NA NECK: Focal ventral floor of mouth hypermetabolism, SUV max 6.3 with a possible 1.6 cm hyperdense soft tissue lesion (4/34). No hypermetabolic lymph nodes. Incidental CT findings: None. CHEST: Posterior left upper lobe mass measures 3.2 x 3.8 cm, SUV max 3.8, with mass effect on the adjacent major fissure. Associated surrounding peribronchovascular nodularity and mucoid impaction. Additional small bilateral pulmonary nodules with a 7 mm nodule in the medial right lower lobe (4 x 10 mm, 7/57). Measured nodule has increased in size from 6 mm on 03/23/2021. Nodules are too small for PET resolution. No additional abnormal hypermetabolism. Incidental CT findings: Atherosclerotic calcification of the aorta, aortic valve and coronary arteries. Heart is at the upper limits of normal in size. No pericardial or pleural effusion. Centrilobular and paraseptal emphysema. ABDOMEN/PELVIS: No abnormal hypermetabolism. Incidental CT findings: Small cyst in the dome of the liver. No specific follow-up necessary. Small low-attenuation lesion in the right kidney. No specific follow-up necessary. SKELETON: Focal hypermetabolism the posterior right tenth rib corresponds to a likely healed nondisplaced  fracture. Otherwise, no abnormal hypermetabolism. Incidental CT findings: Degenerative changes in the spine. IMPRESSION: 1. Hypermetabolic posterior left upper lobe mass with surrounding endobronchial nodularity and mucoid impaction. Findings are highly worrisome for primary bronchogenic carcinoma and at least T2a N0 M0 or stage IB disease. Small bilateral pulmonary nodules are too small for PET resolution and are therefore indeterminate for metastatic disease. 2. Additional bilateral pulmonary nodules are too small for PET resolution. Recommend attention on follow-up. 3. Focal ventral floor mouth hypermetabolism with a possible 1.6 cm hyperdense soft tissue lesion, worrisome for malignancy. This may be amenable to direct inspection. 4. Aortic atherosclerosis (ICD10-I70.0). Coronary artery calcification. 5.  Emphysema (ICD10-J43.9). Electronically Signed   By: Newell Eke M.D.   On: 12/18/2023 11:24   MR Brain W Wo Contrast Result Date: 12/18/2023 CLINICAL DATA:  Non-small-cell lung cancer EXAM: MRI HEAD WITHOUT AND WITH CONTRAST TECHNIQUE: Multiplanar, multiecho pulse sequences of the brain and surrounding structures were obtained without and with intravenous contrast. CONTRAST:  5mL GADAVIST  GADOBUTROL  1 MMOL/ML IV SOLN COMPARISON:  January 22, 2009 FINDINGS: MRI brain: There is a rounded focus of T2 hyperintensity in the left periventricular frontal white matter. This was present on the prior study from 2010. There is a small focus of T2 hyperintensity and T1 hypointensity in the periventricular white matter of the anterior right parietal lobe without enhancement. Signal in the brain parenchyma is otherwise normal. No abnormal enhancement. There is no acute or chronic infarct. The ventricles are normal. No mass lesion. There are normal flow signals in the carotid arteries and basilar artery. No significant bone marrow signal abnormality. Abnormal left globe is unchanged IMPRESSION: No evidence of  metastatic disease Electronically Signed   By: Nancyann Burns M.D.   On: 12/18/2023 08:26    Administration History     None  No data to display          No results found for: NITRICOXIDE   Assessment & Plan:   Assessment & Plan NSCLC of left lung (HCC)  Hemoptysis  Assessment and Plan Assessment & Plan Hemoptysis following bronchoscopy with biopsy Intermittent hemoptysis likely due to recent biopsy and underlying malignancy.   - Advise ER visit if hemoptysis increases, or if new symptoms of chest pain, shortness of breath, dizziness/lightheadedness or fever occur. - Inform Dr. Shelah of current status. - Close follow up suggested; patient wishes to keep Pulmonary follow up on an as needed basis for now. -  Patient and wife agreeable to plan of care.  Lung cancer (planned for chemotherapy) Lung cancer diagnosis with planned chemotherapy. No current pulmonary symptoms related to cancer progression. - Proceed with scheduled port placement and chemotherapy with oncology team.    Candis Dandy, PA-C 01/03/2024

## 2024-01-03 NOTE — Patient Instructions (Addendum)
 Continue to monitor symptoms; if worsening hemoptysis, new dizziness, lightheadedness, chest discomfort, shortness of breath, fever- proceed to the ED for evaluation.  Keep Oncology follow ups as discussed.  Follow up with our office as needed.

## 2024-01-04 ENCOUNTER — Other Ambulatory Visit: Payer: Self-pay

## 2024-01-04 ENCOUNTER — Telehealth: Payer: Self-pay

## 2024-01-04 NOTE — Telephone Encounter (Signed)
 Thoracic Tumor Board Review  Juan David was presented on the on the Thoracic Tumor Board on 01/04/2024. Based on he is a candidate for genetic testing.  Juan David's personal and family history is suspicious for a hereditary cancer predisposition syndrome. Based on his personal history of rectal cancer and his mom's history of colon cancer.  Santana Fryer, MS, CGC  Certified Genetic Counselor  Email: [email or web address]  Phone: [phone]

## 2024-01-04 NOTE — Progress Notes (Signed)
 The proposed treatment discussed in conference is for discussion purpose only and is not a binding recommendation.  The patients have not been physically examined, or presented with their treatment options.  Therefore, final treatment plans cannot be decided.

## 2024-01-05 ENCOUNTER — Other Ambulatory Visit (HOSPITAL_COMMUNITY): Payer: Self-pay

## 2024-01-05 ENCOUNTER — Other Ambulatory Visit: Payer: Self-pay

## 2024-01-05 ENCOUNTER — Telehealth: Payer: Self-pay

## 2024-01-05 NOTE — Telephone Encounter (Signed)
 Oral Oncology Patient Advocate Encounter  Prior Authorization RENEWAL for Deferasirox   has been approved.    PA# [id number] Effective dates: 01/05/24 through 01/03/25     Charlott Hamilton,  CPhT-Adv  she/her/hers [place]  [facility] Pharmacy Technician Patient Advocate Specialist III [facility] Phone: [phone]  Fax: [phone] [email or web address]

## 2024-01-05 NOTE — Progress Notes (Signed)
I have submitted the PA.

## 2024-01-05 NOTE — Telephone Encounter (Signed)
 Oral Oncology Patient Advocate Encounter   Received notification that prior authorization for Deferasirox  is required.   PA submitted on 01/05/24 Key [id number] Status is pending      Charlott Hamilton,  CPhT-Adv  she/her/hers [facility]  [facility] Pharmacy Technician Patient Advocate Specialist III [facility] Phone: [phone]  Fax: [phone] [email or web address]

## 2024-01-08 ENCOUNTER — Telehealth: Payer: Self-pay

## 2024-01-08 ENCOUNTER — Other Ambulatory Visit: Payer: Self-pay

## 2024-01-08 ENCOUNTER — Telehealth: Payer: Self-pay | Admitting: Hematology

## 2024-01-08 ENCOUNTER — Other Ambulatory Visit (HOSPITAL_COMMUNITY): Payer: Self-pay

## 2024-01-08 ENCOUNTER — Other Ambulatory Visit: Payer: Self-pay | Admitting: Hematology

## 2024-01-08 DIAGNOSIS — C3492 Malignant neoplasm of unspecified part of left bronchus or lung: Secondary | ICD-10-CM

## 2024-01-08 MED ORDER — DEXAMETHASONE 4 MG PO TABS
ORAL_TABLET | ORAL | 1 refills | Status: AC
  Filled 2024-01-08: qty 30, 15d supply, fill #0
  Filled 2024-01-16 – 2024-03-21 (×3): qty 30, 15d supply, fill #1

## 2024-01-08 MED ORDER — ONDANSETRON HCL 8 MG PO TABS
8.0000 mg | ORAL_TABLET | Freq: Three times a day (TID) | ORAL | 1 refills | Status: AC | PRN
  Filled 2024-01-08: qty 30, 10d supply, fill #0
  Filled 2024-01-16: qty 30, 10d supply, fill #1

## 2024-01-08 MED ORDER — LIDOCAINE-PRILOCAINE 2.5-2.5 % EX CREA
TOPICAL_CREAM | CUTANEOUS | 3 refills | Status: AC
  Filled 2024-01-08: qty 30, 7d supply, fill #0
  Filled 2024-01-16: qty 30, 7d supply, fill #1

## 2024-01-08 MED ORDER — FOLIC ACID 1 MG PO TABS
1.0000 mg | ORAL_TABLET | Freq: Every day | ORAL | 3 refills | Status: AC
  Filled 2024-01-08: qty 90, 90d supply, fill #0
  Filled 2024-01-16 – 2024-04-08 (×4): qty 90, 90d supply, fill #1

## 2024-01-08 MED ORDER — PROCHLORPERAZINE MALEATE 10 MG PO TABS
10.0000 mg | ORAL_TABLET | Freq: Four times a day (QID) | ORAL | 1 refills | Status: AC | PRN
Start: 2024-01-08 — End: ?
  Filled 2024-01-08: qty 30, 8d supply, fill #0
  Filled 2024-01-16: qty 30, 8d supply, fill #1

## 2024-01-08 NOTE — Progress Notes (Signed)
 Pharmacist Chemotherapy Monitoring - Initial Assessment    Anticipated start date: 01/18/2024   The following has been reviewed per standard work regarding the patient's treatment regimen: The patient's diagnosis, treatment plan and drug doses, and organ/hematologic function Lab orders and baseline tests specific to treatment regimen  The treatment plan start date, drug sequencing, and pre-medications Prior authorization status  Patient's documented medication list, including drug-drug interaction screen and prescriptions for anti-emetics and supportive care specific to the treatment regimen The drug concentrations, fluid compatibility, administration routes, and timing of the medications to be used The patient's access for treatment and lifetime cumulative dose history, if applicable  The patient's medication allergies and previous infusion related reactions, if applicable   Changes made to treatment plan:  N/A  Follow up needed:  Port placement and pt allergies (none on file)   Juan David, PharmD, MBA

## 2024-01-08 NOTE — Telephone Encounter (Signed)
 Spoke with pt's wife regarding Secure Chat messages from Scheduling Team regarding pt's medications & B12 injection.  Informed pt's wife that the pt's B12 injection is related to the pt's chemo treatment plan.  Stated that all the pt's chemo, antiemetics, premeds, and the B12 injection will all be explained on 01/09/2024 during the pt education class.  Stated that while the pt is here on 01/09/2024 Dr. Lanny would like for the pt to go ahead and get the B12 injection to decrease the number of un-necessary trips to the [facility].  Pt's wife verbalized understanding and confirmed appts scheduled on 01/09/2024.

## 2024-01-09 ENCOUNTER — Inpatient Hospital Stay

## 2024-01-09 ENCOUNTER — Inpatient Hospital Stay: Attending: Hematology

## 2024-01-09 ENCOUNTER — Other Ambulatory Visit: Payer: Self-pay

## 2024-01-09 VITALS — BP 152/90 | HR 73 | Temp 98.0°F | Resp 18

## 2024-01-09 DIAGNOSIS — Z79899 Other long term (current) drug therapy: Secondary | ICD-10-CM | POA: Diagnosis not present

## 2024-01-09 DIAGNOSIS — C7801 Secondary malignant neoplasm of right lung: Secondary | ICD-10-CM | POA: Diagnosis not present

## 2024-01-09 DIAGNOSIS — C3492 Malignant neoplasm of unspecified part of left bronchus or lung: Secondary | ICD-10-CM

## 2024-01-09 DIAGNOSIS — Z5111 Encounter for antineoplastic chemotherapy: Secondary | ICD-10-CM | POA: Insufficient documentation

## 2024-01-09 DIAGNOSIS — F17200 Nicotine dependence, unspecified, uncomplicated: Secondary | ICD-10-CM | POA: Diagnosis not present

## 2024-01-09 DIAGNOSIS — C3412 Malignant neoplasm of upper lobe, left bronchus or lung: Secondary | ICD-10-CM | POA: Diagnosis not present

## 2024-01-09 DIAGNOSIS — Z85048 Personal history of other malignant neoplasm of rectum, rectosigmoid junction, and anus: Secondary | ICD-10-CM | POA: Diagnosis not present

## 2024-01-09 DIAGNOSIS — T451X5A Adverse effect of antineoplastic and immunosuppressive drugs, initial encounter: Secondary | ICD-10-CM | POA: Diagnosis not present

## 2024-01-09 DIAGNOSIS — K5903 Drug induced constipation: Secondary | ICD-10-CM | POA: Diagnosis not present

## 2024-01-09 DIAGNOSIS — C7802 Secondary malignant neoplasm of left lung: Secondary | ICD-10-CM | POA: Insufficient documentation

## 2024-01-09 MED ORDER — CYANOCOBALAMIN 1000 MCG/ML IJ SOLN
1000.0000 ug | Freq: Once | INTRAMUSCULAR | Status: AC
  Administered 2024-01-09: 1000 ug via INTRAMUSCULAR
  Filled 2024-01-09: qty 1

## 2024-01-10 ENCOUNTER — Encounter: Payer: Self-pay | Admitting: Hematology

## 2024-01-11 ENCOUNTER — Other Ambulatory Visit: Payer: Self-pay

## 2024-01-12 ENCOUNTER — Encounter: Payer: Self-pay | Admitting: Hematology

## 2024-01-15 ENCOUNTER — Inpatient Hospital Stay

## 2024-01-15 ENCOUNTER — Other Ambulatory Visit: Payer: Self-pay

## 2024-01-16 ENCOUNTER — Encounter (HOSPITAL_COMMUNITY): Payer: Self-pay

## 2024-01-16 ENCOUNTER — Ambulatory Visit (HOSPITAL_COMMUNITY)
Admission: RE | Admit: 2024-01-16 | Discharge: 2024-01-16 | Disposition: A | Discharge status: Home / Self Care | Source: Ambulatory Visit | Attending: Hematology | Admitting: Hematology

## 2024-01-16 ENCOUNTER — Other Ambulatory Visit (HOSPITAL_COMMUNITY): Payer: Self-pay

## 2024-01-16 ENCOUNTER — Telehealth: Payer: Self-pay

## 2024-01-16 ENCOUNTER — Other Ambulatory Visit: Payer: Self-pay

## 2024-01-16 ENCOUNTER — Ambulatory Visit (HOSPITAL_COMMUNITY)
Admission: RE | Admit: 2024-01-16 | Discharge: 2024-01-16 | Disposition: A | Source: Ambulatory Visit | Attending: Hematology

## 2024-01-16 DIAGNOSIS — Z87891 Personal history of nicotine dependence: Secondary | ICD-10-CM | POA: Insufficient documentation

## 2024-01-16 DIAGNOSIS — Z923 Personal history of irradiation: Secondary | ICD-10-CM | POA: Insufficient documentation

## 2024-01-16 DIAGNOSIS — Z9221 Personal history of antineoplastic chemotherapy: Secondary | ICD-10-CM | POA: Diagnosis not present

## 2024-01-16 DIAGNOSIS — C3492 Malignant neoplasm of unspecified part of left bronchus or lung: Secondary | ICD-10-CM | POA: Insufficient documentation

## 2024-01-16 DIAGNOSIS — Z85048 Personal history of other malignant neoplasm of rectum, rectosigmoid junction, and anus: Secondary | ICD-10-CM | POA: Diagnosis not present

## 2024-01-16 HISTORY — PX: IR IMAGING GUIDED PORT INSERTION: IMG5740

## 2024-01-16 MED ORDER — FENTANYL CITRATE (PF) 100 MCG/2ML IJ SOLN
INTRAMUSCULAR | Status: AC | PRN
  Administered 2024-01-16: 50 ug via INTRAVENOUS

## 2024-01-16 MED ORDER — MIDAZOLAM HCL (PF) 2 MG/2ML IJ SOLN
INTRAMUSCULAR | Status: AC | PRN
  Administered 2024-01-16: 1 mg via INTRAVENOUS

## 2024-01-16 MED ORDER — HEPARIN SOD (PORK) LOCK FLUSH 100 UNIT/ML IV SOLN
INTRAVENOUS | Status: AC
  Filled 2024-01-16: qty 5

## 2024-01-16 MED ORDER — LIDOCAINE-EPINEPHRINE 1 %-1:100000 IJ SOLN
INTRAMUSCULAR | Status: AC
  Filled 2024-01-16: qty 1

## 2024-01-16 MED ORDER — LIDOCAINE-EPINEPHRINE 1 %-1:100000 IJ SOLN
20.0000 mL | Freq: Once | INTRAMUSCULAR | Status: AC
  Administered 2024-01-16: 20 mL via INTRADERMAL

## 2024-01-16 MED ORDER — FENTANYL CITRATE (PF) 100 MCG/2ML IJ SOLN
INTRAMUSCULAR | Status: AC
  Filled 2024-01-16: qty 2

## 2024-01-16 MED ORDER — MIDAZOLAM HCL 2 MG/2ML IJ SOLN
INTRAMUSCULAR | Status: AC
Start: 2024-01-16 — End: 2024-01-16
  Filled 2024-01-16: qty 2

## 2024-01-16 MED ORDER — HEPARIN SOD (PORK) LOCK FLUSH 100 UNIT/ML IV SOLN
500.0000 [IU] | Freq: Once | INTRAVENOUS | Status: AC
  Administered 2024-01-16: 500 [IU] via INTRAVENOUS

## 2024-01-16 MED ORDER — SODIUM CHLORIDE 0.9 % IV SOLN: INTRAVENOUS | Status: DC

## 2024-01-16 NOTE — H&P (Signed)
 Chief Complaint: NSCLC of left lung - IR consulted for port-a-catheter placement for chemotherapy  Referring Provider(s): [name], MD  Supervising Physician: Hughes Simmonds  Patient Status: [facility] - Out-pt  History of Present Illness: Juan David is a 68 y.o. male with pmhx of rectal cancer diagnosed in 2018, s/p resection and chemo/radiation and 5 years of surveillance. He was recently found to have primary left lung metastatic cancer as incidental finding on hemochromatosis evaluation. CT from 12/05/23 with 3.9 cm mass in LUL with numerous satellite nodules. Bronchoscopy biopsy from 12/25/23 shows adenocarcinoma. Patient now referred to IR service for port-a-catheter placement for chemotherapy.  Today patient without complaint. Has been NPO since midnight.   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   Traumatic glaucoma, 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 at [facility] in [place]   08-18-2001   Wears glasses     Past Surgical History:  Procedure Laterality Date   ARTHROSCOPIC REPAIR ACL Right 1998   BIOPSY  03/16/2021   Procedure: BIOPSY;  Surgeon: [name], MD;  Location: [facility] ENDOSCOPY;  Service: Endoscopy;;   ESOPHAGOGASTRODUODENOSCOPY (EGD) WITH PROPOFOL  N/A 03/16/2021   Procedure: ESOPHAGOGASTRODUODENOSCOPY (EGD) WITH PROPOFOL ;  Surgeon: [name], MD;   Location: [facility] ENDOSCOPY;  Service: Endoscopy;  Laterality: N/A;   EXPLORATION AND REPAIR LEFT EYE INJURY  08-18-2001   [facility]   ruptured globe- repair corneal laceration, reposition of prolapsed uveal tissue   HEMORRHOID SURGERY N/A 09/01/2016   Procedure: HEMORRHOIDECTOMY;  Surgeon: [name], MD;  Location: [facility];  Service: General;  Laterality: N/A;   JOINT REPLACEMENT Right    knee   LAPAROTOMY N/A 12/04/2020   Procedure: EXPLORATORY LAPAROTOMY small bowel resection;  Surgeon: [name], MD;  Location: [facility] OR;  Service: General;  Laterality: N/A;   SUPERFICIAL KERATECTOMY Left 06-29-2011    [facility]    with EDTA scrub of left eye   TEE WITHOUT CARDIOVERSION  05-11-2009  dr vina gull   LVSEF 55-65%/  mild thickened AV without AI/  trace MR/ mixed fixed artherosclerosis plaqueing thoracic aorta/  no evidence thrombus/  by agitated saling and color doppler there was a PFO   VIDEO BRONCHOSCOPY WITH ENDOBRONCHIAL NAVIGATION Bilateral 12/25/2023   Procedure: VIDEO BRONCHOSCOPY WITH ENDOBRONCHIAL NAVIGATION;  Surgeon: Shelah Lamar RAMAN, MD;  Location: [facility] ENDOSCOPY;  Service: Pulmonary;  Laterality: Bilateral;    Allergies: Patient has no allergy information on record.  Medications: Prior to Admission medications   Medication Sig Start Date End Date Taking? Authorizing Provider  folic acid (FOLVITE) 1 MG tablet Take 1 tablet (1 mg total) by mouth daily. Start 7 days before pemetrexed chemotherapy. Continue until 19  days after pemetrexed completed. 01/08/24  Yes [name], MD  hydrochlorothiazide  (HYDRODIURIL ) 25 MG tablet Take 1 tablet (25 mg total) by mouth daily for hypertension. 07/26/23  Yes   polyethylene glycol (MIRALAX / GLYCOLAX) 17 g packet Take 17 g by mouth in the morning.   Yes [provider]  timolol  (TIMOPTIC ) 0.5 % ophthalmic solution Place 1 drop into the left eye daily. 10/01/21  Yes   timolol  (TIMOPTIC ) 0.5 % ophthalmic solution Place 1  drop into the left eye every morning. 03/21/23  Yes [name], MD  acetaminophen  (TYLENOL ) 500 MG tablet Take 2 tablets (1,000 mg total) by mouth every 6 (six) hours as needed. 02/03/21   Tammy Sor, PA-C  Deferasirox  180 MG TABS Take 4 tablets (720 mg total) by mouth daily. Increase tablets up to goal dose as instructed by MD. Take on an empty stomach at least 30 minutes before a meal. 11/03/23   [name], MD  dexamethasone  (DECADRON ) 4 MG tablet Take 1 tablet 2 times daily starting day before pemetrexed. Then take 2 tablets daily x 3 days starting day after carboplatin. Take with food. 01/08/24   [name], MD  lidocaine -prilocaine (EMLA) cream Apply to affected area once 01/08/24   [name], MD  moxifloxacin  (VIGAMOX ) 0.5 % ophthalmic solution Place 1 drop into the left eye 4 (four) times daily for 10 days. 05/24/22     Multiple Vitamin (MULTIVITAMIN WITH MINERALS) TABS tablet Take 1 tablet by mouth in the morning.    [provider]  ondansetron  (ZOFRAN ) 8 MG tablet Take 1 tablet (8 mg total) by mouth every 8 (eight) hours as needed for nausea or vomiting. 06/26/23   Hanford Powell BRAVO, NP  ondansetron  (ZOFRAN ) 8 MG tablet Take 1 tablet (8 mg total) by mouth every 8 (eight) hours as needed for nausea or vomiting. Start on the third day after carboplatin. 01/08/24   [name], MD  prochlorperazine  (COMPAZINE ) 10 MG tablet Take 1 tablet (10 mg total) by mouth every 6 (six) hours as needed for nausea or vomiting. 01/08/24   [name], MD     Family History  Problem Relation Age of Onset   Colon cancer Mother    Cancer Father        Unknown    Social History   Socioeconomic History   Marital status: Married    Spouse name: susan True   Number of children: Not on file   Years of education: Not on file   Highest education level: Not on file  Occupational History   Not on file  Tobacco Use   Smoking status: Former    Types: Cigars    Quit date: 12/06/2023    Years since quitting:  0.1   Smokeless tobacco: Former    Quit date: 08/25/2008   Tobacco comments:    per pt former smoker of small cigar x2 daily (average) previous smoked cigeratte until 2010    Pt stopped smoking 12/2023 was smoking 1 black and mild a day  Vaping Use   Vaping status: Never Used  Substance and Sexual Activity   Alcohol use: Yes    Comment: occasionally   Drug use: Not Currently    Types: Marijuana   Sexual activity: Not on file  Other Topics Concern   Not on file  Social History Narrative   The patient is married no children   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   He  smokes cigars 2 caffeinated beverages daily no drug use no other tobacco some alcohol use   Social Drivers of Corporate Investment Banker Strain: Low Risk  (03/23/2021)   Overall Financial Resource Strain (CARDIA)    Difficulty of Paying Living Expenses: Not hard at all  Food Insecurity: No Food Insecurity (03/23/2021)   Hunger Vital Sign    Worried About Running Out of Food in the Last Year: Never true    Ran Out of Food in the Last Year: Never true  Transportation Needs: No Transportation Needs (03/23/2021)   PRAPARE - Administrator, Civil Service (Medical): No    Lack of Transportation (Non-Medical): No  Physical Activity: Not on file  Stress: No Stress Concern Present (03/23/2021)   [employer] of Occupational Health - Occupational Stress Questionnaire    Feeling of Stress : Not at all  Social Connections: Socially Integrated (02/15/2021)   Social Connection and Isolation Panel    Frequency of Communication with Friends and Family: More than three times a week    Frequency of Social Gatherings with Friends and Family: More than three times a week    Attends Religious Services: 1 to 4 times per year    Active Member of [employer] or Organizations: Yes    Attends Banker Meetings: 1 to 4 times per year    Marital Status: Married      Review of Systems: A 12 point ROS discussed and pertinent positives are indicated in the HPI above.  All other systems are negative.    Vital Signs: BP (!) 132/91 (BP Location: Right Arm)   Temp 97.8 F (36.6 C) (Oral)   Resp 16   Ht 5' 11 (1.803 m)   Wt 123 lb 14.4 oz (56.2 kg)   BMI 17.28 kg/m   Advance Care Plan: No documents on file  Physical Exam Vitals and nursing note reviewed.  Constitutional:      General: He is not in acute distress. HENT:     Mouth/Throat:     Mouth: Mucous membranes are moist.     Pharynx: Oropharynx is clear.  Cardiovascular:     Rate and Rhythm: Normal rate and regular rhythm.  Pulmonary:     Effort: Pulmonary effort is normal.     Breath sounds: Normal breath sounds.  Abdominal:     Palpations: Abdomen is soft.     Tenderness: There is no abdominal tenderness.  Skin:    General: Skin is warm and dry.  Neurological:     Mental Status: He is alert and oriented to person, place, and time. Mental status is at baseline.     Imaging: DG Chest Port 1 View Result Date: 12/25/2023 CLINICAL DATA:  Status post bronchoscopy and biopsy, known left upper lobe mass EXAM: PORTABLE CHEST 1 VIEW COMPARISON:  12/15/2023 FINDINGS: Two frontal views of the chest demonstrate stable lobular left upper lobe mass corresponding to the metabolically active lesion on recent PET scan, suggestive of bronchogenic malignancy. The smaller nodule seen elsewhere within the left upper and left lower lobe on recent PET scan are not as well visualized by x-ray. No acute airspace disease, effusion, or pneumothorax. Cardiac silhouette is stable, with persistent ectasia of the thoracic aorta. There are no acute bony abnormalities. IMPRESSION: 1. Stable left upper lobe mass concerning for bronchogenic malignancy. 2. No evidence of complication after biopsy.  No pneumothorax. Electronically Signed   By: Ozell Daring M.D.   On: 12/25/2023 15:27  DG C-ARM  BRONCHOSCOPY Result Date: 12/25/2023 C-ARM BRONCHOSCOPY: Fluoroscopy was utilized by the requesting physician.  No radiographic interpretation.   DG C-Arm 1-60 Min-No Report Result Date: 12/25/2023 Fluoroscopy was utilized by the requesting physician.  No radiographic interpretation.    Labs:  CBC: Recent Labs    08/09/23 0856 09/12/23 0902 11/07/23 1214 12/25/23 1027  WBC 6.1 5.2 9.1 6.1  HGB 13.1 13.6 12.6* 14.0  HCT 39.0 39.3 38.1* 42.9  PLT 258 196 278 232    COAGS: No results for input(s): INR, APTT in the last 8760 hours.  BMP: Recent Labs    08/03/23 0912 12/25/23 1208  NA 140 137  K 4.4 4.1  CL 106 103  CO2 27 23  GLUCOSE 98 83  BUN 20 15  CALCIUM 9.8 9.2  CREATININE 1.06 1.28*  GFRNONAA >60 >60    LIVER FUNCTION TESTS: Recent Labs    08/03/23 0912  BILITOT 0.4  AST 19  ALT 14  ALKPHOS 102  PROT 7.7  ALBUMIN  4.6    TUMOR MARKERS: No results for input(s): AFPTM, CEA, CA199, CHROMGRNA in the last 8760 hours.  Assessment and Plan:  Juan David is a 68 y.o. male with pmhx of rectal cancer diagnosed in 2018, s/p resection and chemo/radiation and 5 years of surveillance. He was recently found to have primary left lung metastatic cancer as incidental finding on hemochromatosis evaluation. CT from 12/05/23 with 3.9 cm mass in LUL with numerous satellite nodules. Bronchoscopy biopsy from 12/25/23 shows adenocarcinoma. Patient now referred to IR service for port-a-catheter placement for chemotherapy.  Today patient without complaint. Has been NPO since midnight.  Risks and benefits of image-guided Port-a-catheter placement were discussed with the patient including, but not limited to bleeding, infection, pneumothorax, or fibrin sheath development and need for additional procedures. All of the patient's questions were answered, patient is agreeable to proceed. Consent signed and in chart.   Thank you for allowing our service to  participate in Juan David 's care.  Electronically Signed: Kimble VEAR Clas, PA-C   01/16/2024, 8:34 AM      I spent a total of  15 Minutes   in face to face in clinical consultation, greater than 50% of which was counseling/coordinating care for port-a-catheter placement

## 2024-01-16 NOTE — Telephone Encounter (Signed)
 Return the pt wife phone call regarding FMLA forms. Pt stated that she got a phone call about her FMLA forms have not been received from [employer]. I let her know that we have not received any forms in office on his behalf. Pt wife stated that she will stop by today to get information.

## 2024-01-16 NOTE — Sedation Documentation (Signed)
 RN Adyan Palau pulled 2mg  Versed  and 100mcg Fentanyl  in IR room pysix.Pt. Received 2mg  Versed  and 100 mcg Fentanyl  throughout the procedure.

## 2024-01-16 NOTE — Discharge Instructions (Addendum)
 Discharge Instructions:   Please call Interventional Radiology clinic [phone] with any questions or concerns.  You may remove your dressing and shower tomorrow (in 24 to 48 hours).  Do not use EMLA / Lidocaine  cream (or any other creams ointments or lotions) for 2 weeks post Port Insertion this will remove the surgical glue.    Moderate Conscious Sedation, Adult, Care After  This sheet gives you information about how to care for yourself after your procedure. Your health care provider may also give you more specific instructions. If you have problems or questions, contact your health care provider. What can I expect after the procedure? After the procedure, it is common to have: Sleepiness for several hours. Impaired judgment for several hours. Difficulty with balance. Vomiting if you eat too soon. Follow these instructions at home: For the time period you were told by your health care provider: Rest. Do not participate in activities where you could fall or become injured. Do not drive or use machinery. Do not drink alcohol. Do not take sleeping pills or medicines that cause drowsiness. Do not make important decisions or sign legal documents. Do not take care of children on your own. Eating and drinking  Follow the diet recommended by your health care provider. Drink enough fluid to keep your urine pale yellow. If you vomit: Drink water , juice, or soup when you can drink without vomiting. Make sure you have little or no nausea before eating solid foods. General instructions Take over-the-counter and prescription medicines only as told by your health care provider. Have a responsible adult stay with you for the time you are told. It is important to have someone help care for you until you are awake and alert. Do not smoke. Keep all follow-up visits as told by your health care provider. This is important. Contact a health care provider if: You are still sleepy or having  trouble with balance after 24 hours. You feel light-headed. You keep feeling nauseous or you keep vomiting. You develop a rash. You have a fever. You have redness or swelling around the IV site. Get help right away if: You have trouble breathing. You have new-onset confusion at home. Summary After the procedure, it is common to feel sleepy, have impaired judgment, or feel nauseous if you eat too soon. Rest after you get home. Know the things you should not do after the procedure. Follow the diet recommended by your health care provider and drink enough fluid to keep your urine pale yellow. Get help right away if you have trouble breathing or new-onset confusion at home. This information is not intended to replace advice given to you by your health care provider. Make sure you discuss any questions you have with your health care provider. Document Revised: 06/21/2019 Document Reviewed: 01/17/2019 Elsevier Patient Education  2023 Elsevier Inc.   Implanted [place] Insertion, Care After  The following information offers guidance on how to care for yourself after your procedure. Your health care provider may also give you more specific instructions. If you have problems or questions, contact your health care provider. What can I expect after the procedure? After the procedure, it is common to have: Discomfort at the port insertion site. Bruising on the skin over the port. This should improve over 3-4 days. Follow these instructions at home: [facility] care After your port is placed, you will get a manufacturer's information card. The card has information about your port. Keep this card with you at all times. Take care of  the port as told by your health care provider. Ask your health care provider if you or a family member can get training for taking care of the port at home. A home health care nurse will be be available to help care for the port. Make sure to remember what type of port you  have. Incision care     Follow instructions from your health care provider about how to take care of your port insertion site. Make sure you: Wash your hands with soap and water  for at least 20 seconds before and after you change your bandage (dressing). If soap and water  are not available, use hand sanitizer. Change your dressing as told by your health care provider. Leave stitches (sutures), skin glue, or adhesive strips in place. These skin closures may need to stay in place for 2 weeks or longer. If adhesive strip edges start to loosen and curl up, you may trim the loose edges. Do not remove adhesive strips completely unless your health care provider tells you to do that. Check your port insertion site every day for signs of infection. Check for: Redness, swelling, or pain. Fluid or blood. Warmth. Pus or a bad smell. Activity Return to your normal activities as told by your health care provider. Ask your health care provider what activities are safe for you. You may have to avoid lifting. Ask your health care provider how much you can safely lift. General instructions Take over-the-counter and prescription medicines only as told by your health care provider. Do not take baths, swim, or use a hot tub until your health care provider approves. Ask your health care provider if you may take showers. You may only be allowed to take sponge baths. If you were given a sedative during the procedure, it can affect you for several hours. Do not drive or operate machinery until your health care provider says that it is safe. Wear a medical alert bracelet in case of an emergency. This will tell any health care providers that you have a port. Keep all follow-up visits. This is important. Contact a health care provider if: You cannot flush your port with saline as directed, or you cannot draw blood from the port. You have a fever or chills. You have redness, swelling, or pain around your port insertion  site. You have fluid or blood coming from your port insertion site. Your port insertion site feels warm to the touch. You have pus or a bad smell coming from the port insertion site. Get help right away if: You have chest pain or shortness of breath. You have bleeding from your port that you cannot control. These symptoms may be an emergency. Get help right away. Call 911. Do not wait to see if the symptoms will go away. Do not drive yourself to the hospital. Summary Take care of the port as told by your health care provider. Keep the manufacturer's information card with you at all times. Change your dressing as told by your health care provider. Contact a health care provider if you have a fever or chills or if you have redness, swelling, or pain around your port insertion site. Keep all follow-up visits. This information is not intended to replace advice given to you by your health care provider. Make sure you discuss any questions you have with your health care provider. Document Revised: 08/25/2020 Document Reviewed: 08/25/2020 Elsevier Patient Education  2023 [employer].

## 2024-01-16 NOTE — Procedures (Signed)
 Vascular and Interventional Radiology Procedure Note  Patient: Juan David DOB: Aug 01, 1955 Medical Record Number: [id number] Note Date/Time: 01/16/24 9:35 AM   Performing Physician: Thom Hall, MD Assistant(s): None  Diagnosis: Lung cancer  Procedure: PORT PLACEMENT  Anesthesia: Conscious Sedation Complications: None Estimated Blood Loss: Minimal  Findings:  Successful right-sided port placement, with the tip of the catheter in the proximal right atrium.  Plan: Catheter ready for use.  See detailed procedure note with images in PACS. The patient tolerated the procedure well without incident or complication and was returned to Recovery in stable condition.    Thom Hall, MD [facility] Radiology   Pager. [phone] Clinic. [phone]

## 2024-01-16 NOTE — Progress Notes (Signed)
 1100 Ice pack given to use prn to right upper neck and chest as needed for comfort as instructed.

## 2024-01-17 ENCOUNTER — Other Ambulatory Visit: Payer: Self-pay

## 2024-01-17 ENCOUNTER — Other Ambulatory Visit (HOSPITAL_COMMUNITY): Payer: Self-pay

## 2024-01-17 DIAGNOSIS — C3490 Malignant neoplasm of unspecified part of unspecified bronchus or lung: Secondary | ICD-10-CM | POA: Diagnosis not present

## 2024-01-17 MED ORDER — LISINOPRIL 10 MG PO TABS
10.0000 mg | ORAL_TABLET | Freq: Every day | ORAL | 0 refills | Status: DC
  Filled 2024-01-17: qty 30, 30d supply, fill #0

## 2024-01-17 NOTE — Assessment & Plan Note (Signed)
 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  She is a light smoker. - CT 12/04/2013 scan showed 3.9 cm mass in the left upper lobe lung with numerous satellite nodules, and bilateral subcentimeter lung nodules.  -PET scan with hypermetabolic left upper lobe lung mass, no other distant metastasis and small lung nodules are not hypermetabolic on PET. -Patient underwent bronchoscopy and a biopsy on December 25, 2023, the biopsy of the left upper lung mass and right lower lobe lung nodule both showed adenocarcinoma, TTF-1 positive, CDX2 negative, supporting lung primary. -NGS molecular testing and PD-L1 testing result is pending

## 2024-01-18 ENCOUNTER — Inpatient Hospital Stay (HOSPITAL_BASED_OUTPATIENT_CLINIC_OR_DEPARTMENT_OTHER): Admitting: Hematology

## 2024-01-18 ENCOUNTER — Inpatient Hospital Stay

## 2024-01-18 ENCOUNTER — Other Ambulatory Visit: Payer: Self-pay

## 2024-01-18 VITALS — BP 150/99 | HR 76 | Resp 16

## 2024-01-18 VITALS — BP 146/90 | HR 74 | Temp 98.1°F | Ht 71.0 in | Wt 123.7 lb

## 2024-01-18 DIAGNOSIS — C3492 Malignant neoplasm of unspecified part of left bronchus or lung: Secondary | ICD-10-CM

## 2024-01-18 DIAGNOSIS — Z5111 Encounter for antineoplastic chemotherapy: Secondary | ICD-10-CM | POA: Diagnosis not present

## 2024-01-18 LAB — CBC WITH DIFFERENTIAL (CANCER CENTER ONLY)
Abs Immature Granulocytes: 0.01 K/uL (ref 0.00–0.07)
Basophils Absolute: 0 K/uL (ref 0.0–0.1)
Basophils Relative: 0 %
Eosinophils Absolute: 0.2 K/uL (ref 0.0–0.5)
Eosinophils Relative: 2 %
HCT: 34.8 % — ABNORMAL LOW (ref 39.0–52.0)
Hemoglobin: 11.7 g/dL — ABNORMAL LOW (ref 13.0–17.0)
Immature Granulocytes: 0 %
Lymphocytes Relative: 14 %
Lymphs Abs: 1.2 K/uL (ref 0.7–4.0)
MCH: 27.9 pg (ref 26.0–34.0)
MCHC: 33.6 g/dL (ref 30.0–36.0)
MCV: 82.9 fL (ref 80.0–100.0)
Monocytes Absolute: 0.7 K/uL (ref 0.1–1.0)
Monocytes Relative: 9 %
Neutro Abs: 6.3 K/uL (ref 1.7–7.7)
Neutrophils Relative %: 75 %
Platelet Count: 234 K/uL (ref 150–400)
RBC: 4.2 MIL/uL — ABNORMAL LOW (ref 4.22–5.81)
RDW: 14.3 % (ref 11.5–15.5)
WBC Count: 8.4 K/uL (ref 4.0–10.5)
nRBC: 0 % (ref 0.0–0.2)

## 2024-01-18 LAB — CMP (CANCER CENTER ONLY)
ALT: 11 U/L (ref 0–44)
AST: 17 U/L (ref 15–41)
Albumin: 4 g/dL (ref 3.5–5.0)
Alkaline Phosphatase: 97 U/L (ref 38–126)
Anion gap: 5 (ref 5–15)
BUN: 17 mg/dL (ref 8–23)
CO2: 26 mmol/L (ref 22–32)
Calcium: 9.4 mg/dL (ref 8.9–10.3)
Chloride: 105 mmol/L (ref 98–111)
Creatinine: 1.03 mg/dL (ref 0.61–1.24)
GFR, Estimated: >60 mL/min (ref >60)
Glucose, Bld: 102 mg/dL — ABNORMAL HIGH (ref 70–99)
Potassium: 4.1 mmol/L (ref 3.5–5.1)
Sodium: 136 mmol/L (ref 135–145)
Total Bilirubin: 0.3 mg/dL (ref 0.0–1.2)
Total Protein: 6.7 g/dL (ref 6.5–8.1)

## 2024-01-18 LAB — TSH: TSH: 1.12 u[IU]/mL (ref 0.350–4.500)

## 2024-01-18 LAB — GUARDANT 360

## 2024-01-18 MED ORDER — PALONOSETRON HCL INJECTION 0.25 MG/5ML
0.2500 mg | Freq: Once | INTRAVENOUS | Status: AC
  Administered 2024-01-18: 0.25 mg via INTRAVENOUS
  Filled 2024-01-18: qty 5

## 2024-01-18 MED ORDER — SODIUM CHLORIDE 0.9 % IV SOLN: INTRAVENOUS | Status: DC

## 2024-01-18 MED ORDER — SODIUM CHLORIDE 0.9 % IV SOLN
150.0000 mg | Freq: Once | INTRAVENOUS | Status: AC
  Administered 2024-01-18: 150 mg via INTRAVENOUS
  Filled 2024-01-18: qty 150

## 2024-01-18 MED ORDER — DEXAMETHASONE SOD PHOSPHATE PF 10 MG/ML IJ SOLN
10.0000 mg | Freq: Once | INTRAMUSCULAR | Status: AC
  Administered 2024-01-18: 10 mg via INTRAVENOUS

## 2024-01-18 MED ORDER — SODIUM CHLORIDE 0.9 % IV SOLN
394.5000 mg | Freq: Once | INTRAVENOUS | Status: AC
  Administered 2024-01-18: 390 mg via INTRAVENOUS
  Filled 2024-01-18: qty 39

## 2024-01-18 MED ORDER — SODIUM CHLORIDE 0.9 % IV SOLN
200.0000 mg | Freq: Once | INTRAVENOUS | Status: AC
  Administered 2024-01-18: 200 mg via INTRAVENOUS
  Filled 2024-01-18: qty 200

## 2024-01-18 MED ORDER — SODIUM CHLORIDE 0.9 % IV SOLN
500.0000 mg/m2 | Freq: Once | INTRAVENOUS | Status: AC
  Administered 2024-01-18: 800 mg via INTRAVENOUS
  Filled 2024-01-18: qty 32

## 2024-01-18 NOTE — Patient Instructions (Signed)

## 2024-01-18 NOTE — Progress Notes (Signed)
 [facility]   [phone] [phone]   Clinic Follow up Note   Patient Care Team: [name], MD as PCP - General (Family Medicine) [name], MD as Consulting Physician (General Surgery) [name], MD as Consulting Physician (Radiation Oncology) [name], MD as Consulting Physician (Hematology) [name], MD as Consulting Physician (Gastroenterology) [name], MD as Consulting Physician (General Surgery)  Date of Service:  01/18/2024  CHIEF COMPLAINT: f/u of NSCLC   CURRENT THERAPY:  First-line chemotherapy carboplatin, pemetrexed and Keytruda every 3 weeks  Oncology History   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  She is a light smoker. - CT 12/04/2013 scan showed 3.9 cm mass in the left upper lobe lung with numerous satellite nodules, and bilateral subcentimeter lung nodules.  -PET scan with hypermetabolic left upper lobe lung mass, no other distant metastasis and small lung nodules are not hypermetabolic on PET. -Patient underwent bronchoscopy and a biopsy on December 25, 2023, the biopsy of the left upper lung mass and right lower lobe lung nodule both showed adenocarcinoma, TTF-1 positive, CDX2 negative, supporting lung primary. -NGS molecular testing and PD-L1 testing result is pending  Assessment & Plan Metastatic lung cancer Newly diagnosed metastatic lung cancer with ATM mutation detected in blood, potentially benefiting from PARP inhibitors like Lynparza, though not approved for lung cancer. PD-L1 test negative, predicting less benefit from immunotherapy. Traditional chemotherapy with carboplatin, paclitaxel and immunotherapy Keytruda is planned. Chemotherapy regimen expected to cause significant fatigue and low energy, particularly on day three post-treatment. Goal is to control disease  and prolong life, not cure. Maintenance therapy with Alimta planned after 4-6 cycles of chemotherapy. Radiation therapy considered for local control if main tumor persists after chemotherapy. - Started chemotherapy today  - Administered intravenous steroids and nausea medication prior to chemotherapy. - Scheduled next chemotherapy cycle for December 4th. - Plan for maintenance therapy with Alimta and Keytruda after 4-6 cycles. - Will repeat scan in three months to assess treatment response. - Will consider radiation therapy for local control if main tumor persists after chemotherapy.  Iron overload Confirmed by liver MRI. High ferritin levels likely due to chronic inflammation from cancer rather than iron overload. Current focus is on cancer treatment, so iron overload management is on hold. - Stopped iron chelating medication - Will monitor ferritin levels after cancer treatment.  Plan - We reviewed overall treatment plan, especially the induction chemo with carboplatin, pemetrexed and Keytruda, patient agrees to proceed. - NGS Tempus result still pending, except PD-L1 which is negative.  I reviewed with patient - Follow-up in next week for toxicity check up   SUMMARY OF ONCOLOGIC HISTORY: Oncology History Overview Note  Cancer Staging Rectal adenocarcinoma [facility]) Staging form: Colon and Rectum, AJCC 8th Edition - Pathologic stage from 09/01/2016: Stage I (pT2, pN0, cM0) - Signed by [name], MD on 10/01/2016     Adenocarcinoma of rectum (HCC)  09/01/2016 Initial Diagnosis   Rectal adenocarcinoma (HCC)   09/01/2016 Surgery   Hemorrhoidectomy 09/01/2016. Noted to have a mobile right lateral anal canal mass. Dr. Hila Debby.    09/01/2016 Pathology Results   Invasive adenocarcinoma, well-differentiated, spanning 1.3 cm. The tumor invaded into the anal sphincter muscle. Resection margins were negative. The pathologist notes the tumor would be best staged as pT2 given involvement of  muscle.    09/06/2016 Imaging   CT  scans chest/abdomen/pelvis 09/06/2016 showed no evidence of metastatic disease.    09/22/2016 Imaging   Pelvic MRI 09/22/2016 showed no definite residual anal tumor. There was no evidence of tumor involvement of the external sphincter or adjacent organs. There was no pelvic lymphadenopathy.   10/10/2016 - 11/22/2016 Radiation Therapy   Patient begun adjuvant radiation with Dr. Dewey   10/10/2016 - 11/22/2016 Chemotherapy   xeloda  1500mg , twice daily.   11/24/2017 Imaging   CT CAP W contrast 11/24/17  IMPRESSION: 1. Stable exam. No new or progressive interval findings to suggest metastatic disease. 2.  Aortic Atherosclerois (ICD10-170.0) 3.  Emphysema. (PRI89-G56.9)    NSCLC of left lung (HCC)  12/25/2023 Cancer Staging   Staging form: Lung, AJCC V9 - Clinical stage from 12/25/2023: Stage IVA (cT3, cN0, cM1a) - Signed by [name], MD on 12/31/2023   12/31/2023 Initial Diagnosis   NSCLC of left lung (HCC)   01/18/2024 -  Chemotherapy   Patient is on Treatment Plan : LUNG Carboplatin (5) + Pemetrexed (500) + Pembrolizumab (200) D1 q21d Induction x 4 cycles / Maintenance Pemetrexed (500) + Pembrolizumab (200) D1 q21d        Discussed the use of AI scribe software for clinical note transcription with the patient, who gave verbal consent to proceed.  History of Present Illness Juan David is a 68 year old male with metastatic lung cancer who presents for follow-up and initiation of chemotherapy.  He is scheduled to start chemotherapy today with a combination of two drugs. He has medication for nausea and is taking folic acid at home. He received a control injection last week.  He has high ferritin levels, and a liver MRI showed iron overload. He was previously on medication for this condition.  He takes lisinopril for blood pressure management and needs to pick up his prescription today.  He has a history of rectal cancer treated with  chemotherapy pills and radiation.  He is concerned about the impact of chemotherapy on his work schedule. He has taken the week off for port placement and chemotherapy and plans to return to work on Monday. He works in a hospital setting, handling involuntary commitment paperwork.  He inquires about medication to help relax during chemotherapy sessions.     All other systems were reviewed with the patient and are negative.  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   Traumatic glaucoma, 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 at [facility] in [place]   08-18-2001   Wears glasses     SURGICAL HISTORY: Past Surgical History:  Procedure Laterality Date   ARTHROSCOPIC REPAIR ACL Right 1998   BIOPSY  03/16/2021   Procedure: BIOPSY;  Surgeon: [name], MD;  Location: [facility];  Service: Endoscopy;;   ESOPHAGOGASTRODUODENOSCOPY (EGD) WITH PROPOFOL  N/A 03/16/2021   Procedure: ESOPHAGOGASTRODUODENOSCOPY (EGD) WITH PROPOFOL ;  Surgeon: [name], MD;  Location: [facility];  Service: Endoscopy;  Laterality: N/A;   EXPLORATION AND REPAIR LEFT EYE INJURY  08-18-2001   [facility]   ruptured globe- repair corneal laceration, reposition of prolapsed uveal tissue   HEMORRHOID SURGERY N/A 09/01/2016   Procedure: HEMORRHOIDECTOMY;  Surgeon: [name], MD;  Location: [facility];  Service: General;  Laterality: N/A;   IR IMAGING GUIDED PORT INSERTION  01/16/2024   JOINT REPLACEMENT Right    knee   LAPAROTOMY N/A 12/04/2020   Procedure: EXPLORATORY LAPAROTOMY small bowel resection;  Surgeon: [name], MD;  Location: [facility] OR;  Service: General;  Laterality: N/A;   SUPERFICIAL KERATECTOMY Left 06-29-2011    [facility]    with EDTA scrub of left eye   TEE WITHOUT CARDIOVERSION  05-11-2009  dr vina gull   LVSEF 55-65%/  mild thickened AV without AI/  trace MR/ mixed fixed artherosclerosis plaqueing thoracic aorta/  no evidence thrombus/  by agitated saling and color doppler there was a PFO   VIDEO BRONCHOSCOPY WITH ENDOBRONCHIAL NAVIGATION Bilateral 12/25/2023   Procedure: VIDEO BRONCHOSCOPY WITH ENDOBRONCHIAL NAVIGATION;  Surgeon: Shelah Lamar RAMAN, MD;  Location: [facility]COPY;  Service: Pulmonary;  Laterality: Bilateral;    I have reviewed the social history and family history with the patient and they are unchanged from previous note.  ALLERGIES:  has no known allergies.  MEDICATIONS:  Current Outpatient Medications  Medication Sig Dispense Refill   acetaminophen  (TYLENOL ) 500 MG tablet Take 2 tablets (1,000 mg total) by mouth every 6 (six) hours as needed. 30 tablet 0   Deferasirox  180 MG TABS Take 4 tablets (720 mg total) by mouth daily. Increase tablets up to goal dose as instructed by MD. Take on an empty stomach at least 30 minutes before a meal. 120 tablet 2   dexamethasone  (DECADRON ) 4 MG tablet Take 1 tablet 2 times daily starting day before pemetrexed. Then take 2 tablets daily x 3 days starting day after carboplatin. Take with food. 30 tablet 1   folic acid (FOLVITE) 1 MG tablet Take 1 tablet (1 mg total) by mouth daily. Start 7 days before pemetrexed chemotherapy. Continue until 21 days after pemetrexed completed. 100 tablet 3   hydrochlorothiazide  (HYDRODIURIL ) 25 MG tablet Take 1 tablet (25 mg total) by mouth daily for hypertension. 90  tablet 1   lidocaine -prilocaine (EMLA) cream Apply to affected area once 30 g 3   lisinopril (ZESTRIL) 10 MG tablet Take 1 tablet (10 mg total) by mouth daily for hypertension. 30 tablet 0   moxifloxacin  (VIGAMOX ) 0.5 % ophthalmic solution Place 1 drop into the left eye 4 (four) times daily for 10 days. 5 mL 5   Multiple Vitamin (MULTIVITAMIN WITH MINERALS) TABS tablet Take 1 tablet by mouth in the morning.     ondansetron  (ZOFRAN ) 8 MG tablet Take 1 tablet (8 mg total) by mouth every 8 (eight) hours as needed for nausea or vomiting. 10 tablet 0   ondansetron  (ZOFRAN ) 8 MG tablet Take 1 tablet (8 mg total) by mouth every 8 (eight) hours as needed for nausea or vomiting. Start on the third day after carboplatin. 30 tablet 1   polyethylene glycol (MIRALAX / GLYCOLAX) 17 g packet Take 17 g by mouth in the morning.     prochlorperazine  (COMPAZINE ) 10 MG tablet Take 1 tablet (  10 mg total) by mouth every 6 (six) hours as needed for nausea or vomiting. 30 tablet 1   timolol  (TIMOPTIC ) 0.5 % ophthalmic solution Place 1 drop into the left eye daily. 10 mL 11   timolol  (TIMOPTIC ) 0.5 % ophthalmic solution Place 1 drop into the left eye every morning. 10 mL 11   No current facility-administered medications for this visit.   Facility-Administered Medications Ordered in Other Visits  Medication Dose Route Frequency Provider Last Rate Last Admin   0.9 %  sodium chloride  infusion   Intravenous Continuous [name], MD 10 mL/hr at 01/18/24 0917 New Bag at 01/18/24 0917   CARBOplatin (PARAPLATIN) 390 mg in sodium chloride  0.9 % 100 mL chemo infusion  390 mg Intravenous Once [name], MD       PEMEtrexed Disodium (ALIMTA) 800 mg in sodium chloride  0.9 % 100 mL chemo infusion  500 mg/m2 (Treatment Plan Recorded) Intravenous Once [name], MD 792 mL/hr at 01/18/24 1050 800 mg at 01/18/24 1050    PHYSICAL EXAMINATION: ECOG PERFORMANCE STATUS: 1 - Symptomatic but completely ambulatory  Vitals:   01/18/24 0834  01/18/24 0836  BP: (!) 146/90 (!) 146/90  Pulse: 74   Temp: 98.1 F (36.7 C)   SpO2: 94%    Wt Readings from Last 3 Encounters:  01/18/24 123 lb 11.2 oz (56.1 kg)  01/16/24 123 lb 14.4 oz (56.2 kg)  01/03/24 124 lb (56.2 kg)     GENERAL:alert, no distress and comfortable SKIN: skin color, texture, turgor are normal, no rashes or significant lesions EYES: normal, Conjunctiva are pink and non-injected, sclera clear NECK: supple, thyroid  normal size, non-tender, without nodularity LYMPH:  no palpable lymphadenopathy in the cervical, axillary  LUNGS: clear to auscultation and percussion with normal breathing effort HEART: regular rate & rhythm and no murmurs and no lower extremity edema ABDOMEN:abdomen soft, non-tender and normal bowel sounds Musculoskeletal:no cyanosis of digits and no clubbing  NEURO: alert & oriented x 3 with fluent speech, no focal motor/sensory deficits  Physical Exam    LABORATORY DATA:  I have reviewed the data as listed    Latest Ref Rng & Units 01/18/2024    8:13 AM 12/25/2023   10:27 AM 11/07/2023   12:14 PM  CBC  WBC 4.0 - 10.5 K/uL 8.4  6.1  9.1   Hemoglobin 13.0 - 17.0 g/dL 88.2  85.9  87.3   Hematocrit 39.0 - 52.0 % 34.8  42.9  38.1   Platelets 150 - 400 K/uL 234  232  278         Latest Ref Rng & Units 01/18/2024    8:13 AM 12/25/2023   12:08 PM 08/03/2023    9:12 AM  CMP  Glucose 70 - 99 mg/dL 897  83  98   BUN 8 - 23 mg/dL 17  15  20    Creatinine 0.61 - 1.24 mg/dL 8.96  8.71  8.93   Sodium 135 - 145 mmol/L 136  137  140   Potassium 3.5 - 5.1 mmol/L 4.1  4.1  4.4   Chloride 98 - 111 mmol/L 105  103  106   CO2 22 - 32 mmol/L 26  23  27    Calcium 8.9 - 10.3 mg/dL 9.4  9.2  9.8   Total Protein 6.5 - 8.1 g/dL 6.7   7.7   Total Bilirubin 0.0 - 1.2 mg/dL 0.3   0.4   Alkaline Phos 38 - 126 U/L 97   102  AST 15 - 41 U/L 17   19   ALT 0 - 44 U/L 11   14       RADIOGRAPHIC STUDIES: I have personally reviewed the radiological images  as listed and agreed with the findings in the report. No results found.    Orders Placed This Encounter  Procedures   CBC with Differential (Cancer Center Only)    Standing Status:   Future    Expected Date:   02/08/2024    Expiration Date:   02/07/2025   CMP (Cancer Center only)    Standing Status:   Future    Expected Date:   02/08/2024    Expiration Date:   02/07/2025   CBC with Differential (Cancer Center Only)    Standing Status:   Future    Expected Date:   02/29/2024    Expiration Date:   02/28/2025   CMP (Cancer Center only)    Standing Status:   Future    Expected Date:   02/29/2024    Expiration Date:   02/28/2025   T4    Standing Status:   Future    Expected Date:   02/29/2024    Expiration Date:   02/28/2025   TSH    Standing Status:   Future    Expected Date:   02/29/2024    Expiration Date:   02/28/2025   CBC with Differential (Cancer Center Only)    Standing Status:   Future    Expected Date:   03/21/2024    Expiration Date:   03/21/2025   CMP (Cancer Center only)    Standing Status:   Future    Expected Date:   03/21/2024    Expiration Date:   03/21/2025   All questions were answered. The patient knows to call the clinic with any problems, questions or concerns. No barriers to learning was detected. The total time spent in the appointment was 40 minutes, including review of chart and various tests results, discussions about plan of care and coordination of care plan     Onita Mattock, MD 01/18/2024

## 2024-01-18 NOTE — Patient Instructions (Signed)
 [facility] - A DEPT OF [facility]  Discharge Instructions: Thank you for choosing [facility] to provide your oncology and hematology care.   If you have a lab appointment with the Cancer Center, please go directly to the Cancer Center and check in at the registration area.   Wear comfortable clothing and clothing appropriate for easy access to any Portacath or PICC line.   We strive to give you quality time with your provider. You may need to reschedule your appointment if you arrive late (15 or more minutes).  Arriving late affects you and other patients whose appointments are after yours.  Also, if you miss three or more appointments without notifying the office, you may be dismissed from the clinic at the provider's discretion.      For prescription refill requests, have your pharmacy contact our office and allow 72 hours for refills to be completed.    Today you received the following chemotherapy and/or immunotherapy agents: pembrolizumab, pemetrexed, and carboplatin      To help prevent nausea and vomiting after your treatment, we encourage you to take your nausea medication as directed.  BELOW ARE SYMPTOMS THAT SHOULD BE REPORTED IMMEDIATELY: *FEVER GREATER THAN 100.4 F (38 C) OR HIGHER *CHILLS OR SWEATING *NAUSEA AND VOMITING THAT IS NOT CONTROLLED WITH YOUR NAUSEA MEDICATION *UNUSUAL SHORTNESS OF BREATH *UNUSUAL BRUISING OR BLEEDING *URINARY PROBLEMS (pain or burning when urinating, or frequent urination) *BOWEL PROBLEMS (unusual diarrhea, constipation, pain near the anus) TENDERNESS IN MOUTH AND THROAT WITH OR WITHOUT PRESENCE OF ULCERS (sore throat, sores in mouth, or a toothache) UNUSUAL RASH, SWELLING OR PAIN  UNUSUAL VAGINAL DISCHARGE OR ITCHING   Items with * indicate a potential emergency and should be followed up as soon as possible or go to the Emergency Department if any problems should occur.  Please show the  CHEMOTHERAPY ALERT CARD or IMMUNOTHERAPY ALERT CARD at check-in to the Emergency Department and triage nurse.  Should you have questions after your visit or need to cancel or reschedule your appointment, please contact [facility] - A DEPT OF [facility]  Dept: [phone]  and follow the prompts.  Office hours are 8:00 a.m. to 4:30 p.m. Monday - Friday. Please note that voicemails left after 4:00 p.m. may not be returned until the following business day.  We are closed weekends and major holidays. You have access to a nurse at all times for urgent questions. Please call the main number to the clinic Dept: [phone] and follow the prompts.   For any non-urgent questions, you may also contact your provider using MyChart. We now offer e-Visits for anyone 39 and older to request care online for non-urgent symptoms. For details visit [email or web address].   Also download the MyChart app! Go to the app store, search "MyChart", open the app, select [place], and log in with your MyChart username and password.

## 2024-01-19 ENCOUNTER — Telehealth: Payer: Self-pay

## 2024-01-19 ENCOUNTER — Other Ambulatory Visit: Payer: Self-pay

## 2024-01-19 ENCOUNTER — Telehealth: Payer: Self-pay | Admitting: Hematology

## 2024-01-19 LAB — T4: T4, Total: 8.4 ug/dL (ref 4.5–12.0)

## 2024-01-19 NOTE — Telephone Encounter (Signed)
 Called in the patient  to confirm times and  dates  of upcoming appts.

## 2024-01-19 NOTE — Progress Notes (Signed)
 Therapy has been completed. Dis-enrolling.

## 2024-01-19 NOTE — Telephone Encounter (Signed)
 LVM stating Dr Demetra Scheduler notified Dr Demetra office that the pt's wife wants to know when Dr Lanny is going to order a CT Scan.  Stated that If it's restaging CT to evaluate his response to tx, that will be in 2-3 months, it's too early to order now.  Instructed pt to contact Dr Demetra office should she have additional questions or concerns.

## 2024-01-21 ENCOUNTER — Other Ambulatory Visit: Payer: Self-pay

## 2024-01-22 ENCOUNTER — Other Ambulatory Visit: Payer: Self-pay

## 2024-01-22 ENCOUNTER — Telehealth: Payer: Self-pay

## 2024-01-22 NOTE — Telephone Encounter (Signed)
-----   Message from Nurse Bernardino DEL sent at 01/18/2024 11:47 AM EST ----- Regarding: 1st Time Follow-Up 1st Time Keytruda/Alimta/Carbo Dr Demetra patient No issues during treatment

## 2024-01-22 NOTE — Assessment & Plan Note (Signed)
 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  She is a light smoker. - CT 12/04/2013 scan showed 3.9 cm mass in the left upper lobe lung with numerous satellite nodules, and bilateral subcentimeter lung nodules.  -PET scan with hypermetabolic left upper lobe lung mass, no other distant metastasis and small lung nodules are not hypermetabolic on PET. -Patient underwent bronchoscopy and a biopsy on December 25, 2023, the biopsy of the left upper lung mass and right lower lobe lung nodule both showed adenocarcinoma, TTF-1 positive, CDX2 negative, supporting lung primary. -NGS molecular testing result is pending

## 2024-01-23 ENCOUNTER — Inpatient Hospital Stay

## 2024-01-23 ENCOUNTER — Inpatient Hospital Stay (HOSPITAL_BASED_OUTPATIENT_CLINIC_OR_DEPARTMENT_OTHER): Admitting: Hematology

## 2024-01-23 VITALS — BP 146/98 | HR 64 | Temp 98.2°F | Resp 15 | Ht 71.0 in | Wt 124.7 lb

## 2024-01-23 DIAGNOSIS — C3492 Malignant neoplasm of unspecified part of left bronchus or lung: Secondary | ICD-10-CM | POA: Diagnosis not present

## 2024-01-23 DIAGNOSIS — C7801 Secondary malignant neoplasm of right lung: Secondary | ICD-10-CM | POA: Diagnosis not present

## 2024-01-23 DIAGNOSIS — T451X5A Adverse effect of antineoplastic and immunosuppressive drugs, initial encounter: Secondary | ICD-10-CM | POA: Diagnosis not present

## 2024-01-23 DIAGNOSIS — C7802 Secondary malignant neoplasm of left lung: Secondary | ICD-10-CM | POA: Diagnosis not present

## 2024-01-23 DIAGNOSIS — Z85048 Personal history of other malignant neoplasm of rectum, rectosigmoid junction, and anus: Secondary | ICD-10-CM | POA: Diagnosis not present

## 2024-01-23 DIAGNOSIS — Z79899 Other long term (current) drug therapy: Secondary | ICD-10-CM | POA: Diagnosis not present

## 2024-01-23 DIAGNOSIS — C20 Malignant neoplasm of rectum: Secondary | ICD-10-CM

## 2024-01-23 DIAGNOSIS — C3412 Malignant neoplasm of upper lobe, left bronchus or lung: Secondary | ICD-10-CM | POA: Diagnosis not present

## 2024-01-23 DIAGNOSIS — Z5111 Encounter for antineoplastic chemotherapy: Secondary | ICD-10-CM | POA: Diagnosis not present

## 2024-01-23 DIAGNOSIS — F17200 Nicotine dependence, unspecified, uncomplicated: Secondary | ICD-10-CM | POA: Diagnosis not present

## 2024-01-23 DIAGNOSIS — K5903 Drug induced constipation: Secondary | ICD-10-CM | POA: Diagnosis not present

## 2024-01-23 LAB — CBC WITH DIFFERENTIAL (CANCER CENTER ONLY)
Abs Immature Granulocytes: 0.02 K/uL (ref 0.00–0.07)
Basophils Absolute: 0 K/uL (ref 0.0–0.1)
Basophils Relative: 0 %
Eosinophils Absolute: 0 K/uL (ref 0.0–0.5)
Eosinophils Relative: 0 %
HCT: 36.1 % — ABNORMAL LOW (ref 39.0–52.0)
Hemoglobin: 12 g/dL — ABNORMAL LOW (ref 13.0–17.0)
Immature Granulocytes: 0 %
Lymphocytes Relative: 15 %
Lymphs Abs: 0.9 K/uL (ref 0.7–4.0)
MCH: 27.8 pg (ref 26.0–34.0)
MCHC: 33.2 g/dL (ref 30.0–36.0)
MCV: 83.6 fL (ref 80.0–100.0)
Monocytes Absolute: 0.1 K/uL (ref 0.1–1.0)
Monocytes Relative: 1 %
Neutro Abs: 5.2 K/uL (ref 1.7–7.7)
Neutrophils Relative %: 84 %
Platelet Count: 198 K/uL (ref 150–400)
RBC: 4.32 MIL/uL (ref 4.22–5.81)
RDW: 13.9 % (ref 11.5–15.5)
WBC Count: 6.2 K/uL (ref 4.0–10.5)
nRBC: 0 % (ref 0.0–0.2)

## 2024-01-23 LAB — IRON AND IRON BINDING CAPACITY (CC-WL,HP ONLY)
Iron: 256 ug/dL — ABNORMAL HIGH (ref 45–182)
Saturation Ratios: 95 % — ABNORMAL HIGH (ref 17.9–39.5)
TIBC: 269 ug/dL (ref 250–450)
UIBC: 13 ug/dL — ABNORMAL LOW (ref 117–376)

## 2024-01-23 LAB — FERRITIN: Ferritin: 2305 ng/mL — ABNORMAL HIGH (ref 24–336)

## 2024-01-23 NOTE — Progress Notes (Signed)
 [facility]   [phone] Fax:([phone]) [phone]   Clinic Follow up Note   Patient Care Team: [name], MD as PCP - General (Family Medicine) [name], MD as Consulting Physician (General Surgery) [name], MD as Consulting Physician (Radiation Oncology) [name], MD as Consulting Physician (Hematology) [name], MD as Consulting Physician (Gastroenterology) [name], MD as Consulting Physician (General Surgery)  Date of Service:  01/23/2024  CHIEF COMPLAINT: f/u of non-small cell lung cancer  CURRENT THERAPY:  first-line chemotherapy carboplatin, pemetrexed and Keytruda every 3 weeks  Oncology History    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  She is a light smoker. - CT 12/04/2013 scan showed 3.9 cm mass in the left upper lobe lung with numerous satellite nodules, and bilateral subcentimeter lung nodules.  -PET scan with hypermetabolic left upper lobe lung mass, no other distant metastasis and small lung nodules are not hypermetabolic on PET. -Patient underwent bronchoscopy and a biopsy on December 25, 2023, the biopsy of the left upper lung mass and right lower lobe lung nodule both showed adenocarcinoma, TTF-1 positive, CDX2 negative, supporting lung primary. -NGS molecular testing result is pending   Assessment & Plan Metastatic adenocarcinoma of left lung He started chemotherapy 1 week ago.  He tolerated well overall.  Next chemotherapy cycle scheduled for December 4th, followed by another on December 24th. CT scan planned after the next cycle to assess treatment response. PET scan considered if insurance covers it and if revision therapy is considered. Chemotherapy expected to continue for 4-6 cycles, followed by maintenance therapy. - Continue chemotherapy as scheduled. - Ordered CT  scan after the next chemotherapy cycle. - Will consider PET scan if insurance covers it and if revision therapy is considered.  Chemotherapy-induced fatigue and weakness Experiencing fatigue and weakness, particularly after the third day post-treatment. Able to manage daily activities but requires rest. Handicap sticker requested due to weakness and fatigue when parking far away. - Provided handicap sticker for six months.  Chemotherapy-induced constipation and nausea Reports constipation and one episode of nausea, both of which have improved. No current issues with bowel movements or nausea. - Encouraged increased fluid intake to manage constipation.  Plan - He overall tolerated first cycle chemotherapy well, he plans to return to work tomorrow - Follow-up in 2 weeks before next cycle chemo.  Will order restaging scan on next visit to be done after cycle 3   SUMMARY OF ONCOLOGIC HISTORY: Oncology History Overview Note  Cancer Staging Rectal adenocarcinoma [facility]) Staging form: Colon and Rectum, AJCC 8th Edition - Pathologic stage from 09/01/2016: Stage I (pT2, pN0, cM0) - Signed by [name], MD on 10/01/2016     Adenocarcinoma of rectum (HCC)  09/01/2016 Initial Diagnosis   Rectal adenocarcinoma (HCC)   09/01/2016 Surgery   Hemorrhoidectomy 09/01/2016. Noted to have a mobile right lateral anal canal mass. Dr. Hila Debby.    09/01/2016 Pathology Results   Invasive adenocarcinoma, well-differentiated, spanning 1.3 cm. The tumor invaded into the anal sphincter muscle. Resection margins were negative. The pathologist notes the tumor would be best staged as pT2 given involvement of muscle.    09/06/2016 Imaging   CT scans chest/abdomen/pelvis 09/06/2016 showed no evidence of metastatic disease.    09/22/2016 Imaging   Pelvic MRI 09/22/2016 showed no definite residual anal tumor. There was no evidence of tumor involvement of the  external sphincter or adjacent organs. There was no pelvic  lymphadenopathy.   10/10/2016 - 11/22/2016 Radiation Therapy   Patient begun adjuvant radiation with Dr. Dewey   10/10/2016 - 11/22/2016 Chemotherapy   xeloda  1500mg , twice daily.   11/24/2017 Imaging   CT CAP W contrast 11/24/17  IMPRESSION: 1. Stable exam. No new or progressive interval findings to suggest metastatic disease. 2.  Aortic Atherosclerois (ICD10-170.0) 3.  Emphysema. (PRI89-G56.9)    NSCLC of left lung (HCC)  12/25/2023 Cancer Staging   Staging form: Lung, AJCC V9 - Clinical stage from 12/25/2023: Stage IVA (cT3, cN0, cM1a) - Signed by [name], MD on 12/31/2023   12/31/2023 Initial Diagnosis   NSCLC of left lung (HCC)   01/18/2024 -  Chemotherapy   Patient is on Treatment Plan : LUNG Carboplatin (5) + Pemetrexed (500) + Pembrolizumab (200) D1 q21d Induction x 4 cycles / Maintenance Pemetrexed (500) + Pembrolizumab (200) D1 q21d        Discussed the use of AI scribe software for clinical note transcription with the patient, who gave verbal consent to proceed.  History of Present Illness Juan David is a 68 year old male with lung cancer who presents for follow-up after chemotherapy.  He experiences fatigue starting on the third day post-treatment, requiring two days off work, but plans to return tomorrow. Weakness is present. He had a brief episode of nausea one night after treatment, which has resolved. Constipation occurred for a couple of days but has improved with increased fluid intake. He manages daily activities without severe fatigue.     All other systems were reviewed with the patient and are negative.  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   Hypertension    Iron deficiency    Iron overload    iron deposits on liver   Legally blind in left eye, as defined in [place]     PFO (patent foramen ovale)    per TEE done 05-11-2009    Rectal adenocarcinoma (HCC) 09/28/2016   Traumatic glaucoma, 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 at [facility] in [place]   08-18-2001   Wears glasses     SURGICAL HISTORY: Past Surgical History:  Procedure Laterality Date   ARTHROSCOPIC REPAIR ACL Right 1998   BIOPSY  03/16/2021   Procedure: BIOPSY;  Surgeon: [name], MD;  Location: [facility];  Service: Endoscopy;;   ESOPHAGOGASTRODUODENOSCOPY (EGD) WITH PROPOFOL  N/A 03/16/2021   Procedure: ESOPHAGOGASTRODUODENOSCOPY (EGD) WITH PROPOFOL ;  Surgeon: [name], MD;  Location: [facility];  Service: Endoscopy;  Laterality: N/A;   EXPLORATION AND REPAIR LEFT EYE INJURY  08-18-2001   [place]   ruptured globe- repair corneal laceration, reposition of prolapsed uveal tissue   HEMORRHOID SURGERY N/A 09/01/2016   Procedure: HEMORRHOIDECTOMY;  Surgeon: [name], MD;  Location: [facility];  Service: General;  Laterality: N/A;   IR IMAGING GUIDED PORT INSERTION  01/16/2024   JOINT REPLACEMENT Right    knee   LAPAROTOMY N/A 12/04/2020  Procedure: EXPLORATORY LAPAROTOMY small bowel resection;  Surgeon: [name], MD;  Location: [facility] OR;  Service: General;  Laterality: N/A;   SUPERFICIAL KERATECTOMY Left 06-29-2011    [facility]    with EDTA scrub of left eye   TEE WITHOUT CARDIOVERSION  05-11-2009  dr vina gull   LVSEF 55-65%/  mild thickened AV without AI/  trace MR/ mixed fixed artherosclerosis plaqueing thoracic aorta/  no evidence thrombus/  by agitated saling and color doppler there was a PFO   VIDEO BRONCHOSCOPY WITH ENDOBRONCHIAL NAVIGATION Bilateral 12/25/2023   Procedure: VIDEO BRONCHOSCOPY WITH ENDOBRONCHIAL NAVIGATION;  Surgeon:  Shelah Lamar RAMAN, MD;  Location: [facility] ENDOSCOPY;  Service: Pulmonary;  Laterality: Bilateral;    I have reviewed the social history and family history with the patient and they are unchanged from previous note.  ALLERGIES:  has no known allergies.  MEDICATIONS:  Current Outpatient Medications  Medication Sig Dispense Refill   acetaminophen  (TYLENOL ) 500 MG tablet Take 2 tablets (1,000 mg total) by mouth every 6 (six) hours as needed. 30 tablet 0   Deferasirox  180 MG TABS Take 4 tablets (720 mg total) by mouth daily. Increase tablets up to goal dose as instructed by MD. Take on an empty stomach at least 30 minutes before a meal. 120 tablet 2   dexamethasone  (DECADRON ) 4 MG tablet Take 1 tablet 2 times daily starting day before pemetrexed. Then take 2 tablets daily x 3 days starting day after carboplatin. Take with food. 30 tablet 1   folic acid (FOLVITE) 1 MG tablet Take 1 tablet (1 mg total) by mouth daily. Start 7 days before pemetrexed chemotherapy. Continue until 21 days after pemetrexed completed. 100 tablet 3   hydrochlorothiazide  (HYDRODIURIL ) 25 MG tablet Take 1 tablet (25 mg total) by mouth daily for hypertension. 90 tablet 1   lidocaine -prilocaine (EMLA) cream Apply to affected area once 30 g 3   lisinopril (ZESTRIL) 10 MG tablet Take 1 tablet (10 mg total) by mouth daily for hypertension. 30 tablet 0   moxifloxacin  (VIGAMOX ) 0.5 % ophthalmic solution Place 1 drop into the left eye 4 (four) times daily for 10 days. 5 mL 5   Multiple Vitamin (MULTIVITAMIN WITH MINERALS) TABS tablet Take 1 tablet by mouth in the morning.     ondansetron  (ZOFRAN ) 8 MG tablet Take 1 tablet (8 mg total) by mouth every 8 (eight) hours as needed for nausea or vomiting. 10 tablet 0   ondansetron  (ZOFRAN ) 8 MG tablet Take 1 tablet (8 mg total) by mouth every 8 (eight) hours as needed for nausea or vomiting. Start on the third day after carboplatin. 30 tablet 1   polyethylene glycol (MIRALAX / GLYCOLAX) 17 g packet  Take 17 g by mouth in the morning.     prochlorperazine  (COMPAZINE ) 10 MG tablet Take 1 tablet (10 mg total) by mouth every 6 (six) hours as needed for nausea or vomiting. 30 tablet 1   timolol  (TIMOPTIC ) 0.5 % ophthalmic solution Place 1 drop into the left eye daily. 10 mL 11   timolol  (TIMOPTIC ) 0.5 % ophthalmic solution Place 1 drop into the left eye every morning. 10 mL 11   No current facility-administered medications for this visit.    PHYSICAL EXAMINATION: ECOG PERFORMANCE STATUS: 1 - Symptomatic but completely ambulatory  Vitals:   01/23/24 1046 01/23/24 1049  BP: (!) 160/103 (!) 146/98  Pulse: 64   Resp:    Temp:    SpO2: 100%    Wt Readings from  Last 3 Encounters:  01/23/24 124 lb 11.2 oz (56.6 kg)  01/18/24 123 lb 11.2 oz (56.1 kg)  01/16/24 123 lb 14.4 oz (56.2 kg)     GENERAL:alert, no distress and comfortable SKIN: skin color, texture, turgor are normal, no rashes or significant lesions EYES: normal, Conjunctiva are pink and non-injected, sclera clear Musculoskeletal:no cyanosis of digits and no clubbing  NEURO: alert & oriented x 3 with fluent speech, no focal motor/sensory deficits  Physical Exam   LABORATORY DATA:  I have reviewed the data as listed    Latest Ref Rng & Units 01/23/2024   10:10 AM 01/18/2024    8:13 AM 12/25/2023   10:27 AM  CBC  WBC 4.0 - 10.5 K/uL 6.2  8.4  6.1   Hemoglobin 13.0 - 17.0 g/dL 87.9  88.2  85.9   Hematocrit 39.0 - 52.0 % 36.1  34.8  42.9   Platelets 150 - 400 K/uL 198  234  232         Latest Ref Rng & Units 01/18/2024    8:13 AM 12/25/2023   12:08 PM 08/03/2023    9:12 AM  CMP  Glucose 70 - 99 mg/dL 897  83  98   BUN 8 - 23 mg/dL 17  15  20    Creatinine 0.61 - 1.24 mg/dL 8.96  8.71  8.93   Sodium 135 - 145 mmol/L 136  137  140   Potassium 3.5 - 5.1 mmol/L 4.1  4.1  4.4   Chloride 98 - 111 mmol/L 105  103  106   CO2 22 - 32 mmol/L 26  23  27    Calcium 8.9 - 10.3 mg/dL 9.4  9.2  9.8   Total Protein 6.5 - 8.1  g/dL 6.7   7.7   Total Bilirubin 0.0 - 1.2 mg/dL 0.3   0.4   Alkaline Phos 38 - 126 U/L 97   102   AST 15 - 41 U/L 17   19   ALT 0 - 44 U/L 11   14       RADIOGRAPHIC STUDIES: I have personally reviewed the radiological images as listed and agreed with the findings in the report. No results found.    No orders of the defined types were placed in this encounter.  All questions were answered. The patient knows to call the clinic with any problems, questions or concerns. No barriers to learning was detected. The total time spent in the appointment was 15 minutes, including review of chart and various tests results, discussions about plan of care and coordination of care plan     Onita Mattock, MD 01/23/2024

## 2024-01-25 ENCOUNTER — Telehealth: Payer: Self-pay

## 2024-01-25 ENCOUNTER — Encounter: Payer: Self-pay | Admitting: Hematology

## 2024-01-25 ENCOUNTER — Other Ambulatory Visit: Payer: Self-pay

## 2024-01-25 DIAGNOSIS — C3492 Malignant neoplasm of unspecified part of left bronchus or lung: Secondary | ICD-10-CM | POA: Diagnosis not present

## 2024-01-25 NOTE — Telephone Encounter (Signed)
 Received telephone call from the patient's wife reporting a bruise and tenderness around the port. Wife denies any drainage or swelling, states patient is afebrile. Wife states she was told to contact Dr. Demetra office right away if there were any concerns. Let patient's wife know that I would forward her message to Dr. Lanny to further advise.

## 2024-01-26 ENCOUNTER — Telehealth: Payer: Self-pay

## 2024-01-26 DIAGNOSIS — C3492 Malignant neoplasm of unspecified part of left bronchus or lung: Secondary | ICD-10-CM | POA: Diagnosis not present

## 2024-01-26 NOTE — Telephone Encounter (Signed)
 Spoke with the spouse of Juan David regarding his FMLA forms being completed,faxed, and confirmation received. Pt copy was emalied as requested.

## 2024-02-04 ENCOUNTER — Other Ambulatory Visit: Payer: Self-pay

## 2024-02-06 ENCOUNTER — Other Ambulatory Visit

## 2024-02-07 ENCOUNTER — Inpatient Hospital Stay

## 2024-02-07 ENCOUNTER — Ambulatory Visit: Admitting: Hematology

## 2024-02-07 ENCOUNTER — Inpatient Hospital Stay: Admitting: Hematology

## 2024-02-07 MED FILL — Fosaprepitant Dimeglumine For IV Infusion 150 MG (Base Eq): INTRAVENOUS | Qty: 5 | Status: AC

## 2024-02-08 ENCOUNTER — Inpatient Hospital Stay: Attending: Hematology

## 2024-02-08 ENCOUNTER — Encounter: Payer: Self-pay | Admitting: Hematology

## 2024-02-08 ENCOUNTER — Inpatient Hospital Stay: Admitting: Hematology

## 2024-02-08 ENCOUNTER — Inpatient Hospital Stay

## 2024-02-08 VITALS — BP 126/70 | HR 72 | Temp 97.8°F | Resp 17 | Wt 128.1 lb

## 2024-02-08 VITALS — BP 151/93 | HR 78 | Temp 98.1°F | Resp 16

## 2024-02-08 DIAGNOSIS — C3492 Malignant neoplasm of unspecified part of left bronchus or lung: Secondary | ICD-10-CM | POA: Diagnosis not present

## 2024-02-08 DIAGNOSIS — Z79899 Other long term (current) drug therapy: Secondary | ICD-10-CM | POA: Diagnosis not present

## 2024-02-08 DIAGNOSIS — F17200 Nicotine dependence, unspecified, uncomplicated: Secondary | ICD-10-CM | POA: Diagnosis not present

## 2024-02-08 DIAGNOSIS — D6481 Anemia due to antineoplastic chemotherapy: Secondary | ICD-10-CM | POA: Insufficient documentation

## 2024-02-08 DIAGNOSIS — T451X5A Adverse effect of antineoplastic and immunosuppressive drugs, initial encounter: Secondary | ICD-10-CM | POA: Insufficient documentation

## 2024-02-08 DIAGNOSIS — C3412 Malignant neoplasm of upper lobe, left bronchus or lung: Secondary | ICD-10-CM | POA: Diagnosis present

## 2024-02-08 DIAGNOSIS — Z5111 Encounter for antineoplastic chemotherapy: Secondary | ICD-10-CM | POA: Diagnosis present

## 2024-02-08 DIAGNOSIS — C7802 Secondary malignant neoplasm of left lung: Secondary | ICD-10-CM | POA: Insufficient documentation

## 2024-02-08 DIAGNOSIS — C7801 Secondary malignant neoplasm of right lung: Secondary | ICD-10-CM | POA: Diagnosis not present

## 2024-02-08 DIAGNOSIS — C20 Malignant neoplasm of rectum: Secondary | ICD-10-CM

## 2024-02-08 LAB — CMP (CANCER CENTER ONLY)
ALT: 20 U/L (ref 0–44)
AST: 25 U/L (ref 15–41)
Albumin: 4.2 g/dL (ref 3.5–5.0)
Alkaline Phosphatase: 101 U/L (ref 38–126)
Anion gap: 7 (ref 5–15)
BUN: 14 mg/dL (ref 8–23)
CO2: 24 mmol/L (ref 22–32)
Calcium: 9.5 mg/dL (ref 8.9–10.3)
Chloride: 105 mmol/L (ref 98–111)
Creatinine: 0.98 mg/dL (ref 0.61–1.24)
GFR, Estimated: >60 mL/min (ref >60)
Glucose, Bld: 115 mg/dL — ABNORMAL HIGH (ref 70–99)
Potassium: 4.5 mmol/L (ref 3.5–5.1)
Sodium: 137 mmol/L (ref 135–145)
Total Bilirubin: 0.2 mg/dL (ref 0.0–1.2)
Total Protein: 7 g/dL (ref 6.5–8.1)

## 2024-02-08 LAB — CBC WITH DIFFERENTIAL (CANCER CENTER ONLY)
Abs Immature Granulocytes: 0.03 K/uL (ref 0.00–0.07)
Basophils Absolute: 0 K/uL (ref 0.0–0.1)
Basophils Relative: 0 %
Eosinophils Absolute: 0 K/uL (ref 0.0–0.5)
Eosinophils Relative: 0 %
HCT: 32 % — ABNORMAL LOW (ref 39.0–52.0)
Hemoglobin: 10.7 g/dL — ABNORMAL LOW (ref 13.0–17.0)
Immature Granulocytes: 1 %
Lymphocytes Relative: 12 %
Lymphs Abs: 0.8 K/uL (ref 0.7–4.0)
MCH: 28.1 pg (ref 26.0–34.0)
MCHC: 33.4 g/dL (ref 30.0–36.0)
MCV: 84 fL (ref 80.0–100.0)
Monocytes Absolute: 0.6 K/uL (ref 0.1–1.0)
Monocytes Relative: 9 %
Neutro Abs: 5.1 K/uL (ref 1.7–7.7)
Neutrophils Relative %: 78 %
Platelet Count: 202 K/uL (ref 150–400)
RBC: 3.81 MIL/uL — ABNORMAL LOW (ref 4.22–5.81)
RDW: 14.5 % (ref 11.5–15.5)
WBC Count: 6.5 K/uL (ref 4.0–10.5)
nRBC: 0 % (ref 0.0–0.2)

## 2024-02-08 LAB — IRON AND IRON BINDING CAPACITY (CC-WL,HP ONLY)
Iron: 92 ug/dL (ref 45–182)
Saturation Ratios: 32 % (ref 17.9–39.5)
TIBC: 286 ug/dL (ref 250–450)
UIBC: 193 ug/dL

## 2024-02-08 LAB — FERRITIN: Ferritin: 2732 ng/mL — ABNORMAL HIGH (ref 24–336)

## 2024-02-08 MED ORDER — SODIUM CHLORIDE 0.9 % IV SOLN
200.0000 mg | Freq: Once | INTRAVENOUS | Status: AC
  Administered 2024-02-08: 200 mg via INTRAVENOUS
  Filled 2024-02-08: qty 200

## 2024-02-08 MED ORDER — SODIUM CHLORIDE 0.9 % IV SOLN
403.0000 mg | Freq: Once | INTRAVENOUS | Status: AC
  Administered 2024-02-08: 400 mg via INTRAVENOUS
  Filled 2024-02-08: qty 40

## 2024-02-08 MED ORDER — SODIUM CHLORIDE 0.9 % IV SOLN: INTRAVENOUS | Status: DC

## 2024-02-08 MED ORDER — SODIUM CHLORIDE 0.9 % IV SOLN
500.0000 mg/m2 | Freq: Once | INTRAVENOUS | Status: AC
  Administered 2024-02-08: 800 mg via INTRAVENOUS
  Filled 2024-02-08: qty 20

## 2024-02-08 MED ORDER — SODIUM CHLORIDE 0.9 % IV SOLN
150.0000 mg | Freq: Once | INTRAVENOUS | Status: AC
  Administered 2024-02-08: 150 mg via INTRAVENOUS
  Filled 2024-02-08: qty 150

## 2024-02-08 MED ORDER — PALONOSETRON HCL INJECTION 0.25 MG/5ML
0.2500 mg | Freq: Once | INTRAVENOUS | Status: AC
  Administered 2024-02-08: 0.25 mg via INTRAVENOUS
  Filled 2024-02-08: qty 5

## 2024-02-08 MED ORDER — DEXAMETHASONE SOD PHOSPHATE PF 10 MG/ML IJ SOLN
10.0000 mg | Freq: Once | INTRAMUSCULAR | Status: AC
  Administered 2024-02-08: 10 mg via INTRAVENOUS

## 2024-02-08 NOTE — Assessment & Plan Note (Addendum)
 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  She is a light smoker. - CT 12/04/2013 scan showed 3.9 cm mass in the left upper lobe lung with numerous satellite nodules, and bilateral subcentimeter lung nodules.  -PET scan with hypermetabolic left upper lobe lung mass, no other distant metastasis and small lung nodules are not hypermetabolic on PET. -Patient underwent bronchoscopy and a biopsy on December 25, 2023, the biopsy of the left upper lung mass and right lower lobe lung nodule both showed adenocarcinoma, TTF-1 positive, CDX2 negative, supporting lung primary. -NGS [place] showed no targetable mutation except ATM, likely germline mutation.

## 2024-02-08 NOTE — Patient Instructions (Signed)
 [facility] - A DEPT OF [facility]  Discharge Instructions: Thank you for choosing [facility] to provide your oncology and hematology care.   If you have a lab appointment with the Cancer Center, please go directly to the Cancer Center and check in at the registration area.   Wear comfortable clothing and clothing appropriate for easy access to any Portacath or PICC line.   We strive to give you quality time with your provider. You may need to reschedule your appointment if you arrive late (15 or more minutes).  Arriving late affects you and other patients whose appointments are after yours.  Also, if you miss three or more appointments without notifying the office, you may be dismissed from the clinic at the provider's discretion.      For prescription refill requests, have your pharmacy contact our office and allow 72 hours for refills to be completed.    Today you received the following chemotherapy and/or immunotherapy agents: Pembrolizumab  (Keytruda ), Pemetrexed  disodium (Alimta ), & Carboplatin  (Paraplatin )    To help prevent nausea and vomiting after your treatment, we encourage you to take your nausea medication as directed.  BELOW ARE SYMPTOMS THAT SHOULD BE REPORTED IMMEDIATELY: *FEVER GREATER THAN 100.4 F (38 C) OR HIGHER *CHILLS OR SWEATING *NAUSEA AND VOMITING THAT IS NOT CONTROLLED WITH YOUR NAUSEA MEDICATION *UNUSUAL SHORTNESS OF BREATH *UNUSUAL BRUISING OR BLEEDING *URINARY PROBLEMS (pain or burning when urinating, or frequent urination) *BOWEL PROBLEMS (unusual diarrhea, constipation, pain near the anus) TENDERNESS IN MOUTH AND THROAT WITH OR WITHOUT PRESENCE OF ULCERS (sore throat, sores in mouth, or a toothache) UNUSUAL RASH, SWELLING OR PAIN  UNUSUAL VAGINAL DISCHARGE OR ITCHING   Items with * indicate a potential emergency and should be followed up as soon as possible or go to the Emergency Department if any problems  should occur.  Please show the CHEMOTHERAPY ALERT CARD or IMMUNOTHERAPY ALERT CARD at check-in to the Emergency Department and triage nurse.  Should you have questions after your visit or need to cancel or reschedule your appointment, please contact [facility] - A DEPT [facility]  Dept: [phone]  and follow the prompts.  Office hours are 8:00 a.m. to 4:30 p.m. Monday - Friday. Please note that voicemails left after 4:00 p.m. may not be returned until the following business day.  We are closed weekends and major holidays. You have access to a nurse at all times for urgent questions. Please call the main number to the clinic Dept: [phone] and follow the prompts.   For any non-urgent questions, you may also contact your provider using MyChart. We now offer e-Visits for anyone 44 and older to request care online for non-urgent symptoms. For details visit [email or web address].   Also download the MyChart app! Go to the app store, search MyChart, open the app, select [place], and log in with your MyChart username and password.

## 2024-02-08 NOTE — Progress Notes (Signed)
 [facility]   [phone] [phone]   Clinic Follow up Note   Patient Care Team: [name], MD as PCP - General (Family Medicine) [name], MD as Consulting Physician (General Surgery) [name], MD as Consulting Physician (Radiation Oncology) [name], MD as Consulting Physician (Hematology) [name], MD as Consulting Physician (Gastroenterology) [name], MD as Consulting Physician (General Surgery)  Date of Service:  02/08/2024  CHIEF COMPLAINT: f/u of non-small cell lung cancer  CURRENT THERAPY:  First-line chemotherapy carboplatin , pemetrexed , and Keytruda  every 3 weeks  Oncology History   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  She is a light smoker. - CT 12/04/2013 scan showed 3.9 cm mass in the left upper lobe lung with numerous satellite nodules, and bilateral subcentimeter lung nodules.  -PET scan with hypermetabolic left upper lobe lung mass, no other distant metastasis and small lung nodules are not hypermetabolic on PET. -Patient underwent bronchoscopy and a biopsy on December 25, 2023, the biopsy of the left upper lung mass and right lower lobe lung nodule both showed adenocarcinoma, TTF-1 positive, CDX2 negative, supporting lung primary. -NGS Tempus showed no targetable mutation except ATM, likely germline mutation.  Assessment & Plan Stage IVA non-small cell lung cancer of the left lung on chemotherapy and immunotherapy Stage IVA non-small cell lung cancer of the left lung, currently undergoing chemotherapy and immunotherapy. Tumor sequencing revealed STK11 and ATM mutations, ATM is potentially germline mutation, and a PARP inhibitor can be offered in the future. Ferritin levels are elevated, likely due to a combination of iron overload and cancer. - Continue  chemotherapy and immunotherapy regimen. - Will repeat scan after four cycles to assess response. - Will consider transition to maintenance therapy after four cycles. - Discussed genetic counseling for potential germline mutations. - Monitor ferritin levels and liver function tests.  Chemotherapy-induced anemia Mild anemia with hemoglobin at 10.7, likely due to chemotherapy. White blood cell and platelet counts are normal, allowing continuation of chemotherapy. - Continue to monitor hemoglobin levels.  Chemotherapy-induced urticaria (hives) Urticaria developed on the back, arms, chest, abdomen, and inner thighs, likely related to chemotherapy. No significant itching reported. Dexamethasone  and Benadryl  administered with no significant improvement noted. - Continue dexamethasone  and Benadryl  as needed. - Consider adding Claritin in the morning for allergy management.  Chemotherapy-related fatigue Fatigue noted after chemotherapy, expected to be cumulative with each cycle. He reports feeling more fatigued after work but remains active and maintains good appetite. - Encouraged staying active and maintaining good nutrition. - Advised taking extra days off if needed based on fatigue levels.   Mild, stable iron overload Noted in liver and spleen, likely contributing to elevated ferritin levels. No evidence of cancer in liver or spleen on imaging. Ferritin levels not responding to phlebotomy due to underlying cancer. - Continue to monitor ferritin levels and liver function tests.  Plan - He has recovered well from first cycle chemo, lab reviewed, adequate for treatment, will proceed to cycle 2 chemotherapy today - I discussed NGS Tempus test results - Follow-up in 3 weeks before cycle 3 chemo.  Will order restaging scan on next visit.   SUMMARY OF ONCOLOGIC HISTORY: Oncology History Overview Note  Cancer Staging Rectal adenocarcinoma [facility]) Staging form: Colon and Rectum, AJCC 8th  Edition - Pathologic stage from 09/01/2016: Stage I (pT2, pN0, cM0) - Signed by [name],  MD on 10/01/2016     Adenocarcinoma of rectum (HCC)  09/01/2016 Initial Diagnosis   Rectal adenocarcinoma (HCC)   09/01/2016 Surgery   Hemorrhoidectomy 09/01/2016. Noted to have a mobile right lateral anal canal mass. Dr. Bernarda Ned.    09/01/2016 Pathology Results   Invasive adenocarcinoma, well-differentiated, spanning 1.3 cm. The tumor invaded into the anal sphincter muscle. Resection margins were negative. The pathologist notes the tumor would be best staged as pT2 given involvement of muscle.    09/06/2016 Imaging   CT scans chest/abdomen/pelvis 09/06/2016 showed no evidence of metastatic disease.    09/22/2016 Imaging   Pelvic MRI 09/22/2016 showed no definite residual anal tumor. There was no evidence of tumor involvement of the external sphincter or adjacent organs. There was no pelvic lymphadenopathy.   10/10/2016 - 11/22/2016 Radiation Therapy   Patient begun adjuvant radiation with Dr. Dewey   10/10/2016 - 11/22/2016 Chemotherapy   xeloda  1500mg , twice daily.   11/24/2017 Imaging   CT CAP W contrast 11/24/17  IMPRESSION: 1. Stable exam. No new or progressive interval findings to suggest metastatic disease. 2.  Aortic Atherosclerois (ICD10-170.0) 3.  Emphysema. (PRI89-G56.9)    NSCLC of left lung (HCC)  12/25/2023 Cancer Staging   Staging form: Lung, AJCC V9 - Clinical stage from 12/25/2023: Stage IVA (cT3, cN0, cM1a) - Signed by [name], MD on 12/31/2023   12/31/2023 Initial Diagnosis   NSCLC of left lung (HCC)   01/18/2024 -  Chemotherapy   Patient is on Treatment Plan : LUNG Carboplatin  (5) + Pemetrexed  (500) + Pembrolizumab  (200) D1 q21d Induction x 4 cycles / Maintenance Pemetrexed  (500) + Pembrolizumab  (200) D1 q21d        Discussed the use of AI scribe software for clinical note transcription with the patient, who gave verbal consent to proceed.  History of Present  Illness Juan David is a 68 year old male with nonsmall cell lung cancer who presents for follow-up and cycle two of chemotherapy treatment. He is accompanied by his partner.  He is undergoing treatment for stage 4A nonsmall cell lung cancer and is here for his second chemotherapy cycle. He had fatigue after the first cycle that required two days off work but then returned to work and remains active.  He has developed nonpruritic hives on his back, arms, chest, abdomen, and inner thighs. He took dexamethasone  and Benadryl  but is unsure if they helped, as the hives have not been bothersome.  He has iron overload in the liver and spleen with elevated ferritin that has not improved with phlebotomy. His liver enzymes and bilirubin were normal at the last check.     All other systems were reviewed with the patient and are negative.  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   08-18-2001   Wears glasses     SURGICAL HISTORY: Past Surgical History:  Procedure Laterality Date   ARTHROSCOPIC REPAIR ACL Right 1998   BIOPSY  03/16/2021   Procedure: BIOPSY;  Surgeon: [name], MD;  Location: [facility] ENDOSCOPY;  Service: Endoscopy;;   ESOPHAGOGASTRODUODENOSCOPY (EGD) WITH PROPOFOL  N/A 03/16/2021   Procedure:  ESOPHAGOGASTRODUODENOSCOPY (EGD) WITH PROPOFOL ;  Surgeon: [name], MD;  Location: [facility] ENDOSCOPY;  Service: Endoscopy;  Laterality: N/A;   EXPLORATION AND REPAIR LEFT EYE INJURY  08-18-2001   [facility]   ruptured globe- repair corneal laceration, reposition of prolapsed uveal tissue   HEMORRHOID SURGERY N/A 09/01/2016   Procedure: HEMORRHOIDECTOMY;  Surgeon: [name], MD;  Location: [facility];  Service: General;  Laterality: N/A;   IR IMAGING GUIDED PORT INSERTION  01/16/2024   JOINT REPLACEMENT Right    knee   LAPAROTOMY N/A 12/04/2020   Procedure: EXPLORATORY LAPAROTOMY small bowel resection;  Surgeon: [name], MD;  Location: [facility] OR;  Service: General;  Laterality: N/A;   SUPERFICIAL KERATECTOMY Left 06-29-2011    [facility]    with EDTA scrub of left eye   TEE WITHOUT CARDIOVERSION  05-11-2009  dr vina gull   LVSEF 55-65%/  mild thickened AV without AI/  trace MR/ mixed fixed artherosclerosis plaqueing thoracic aorta/  no evidence thrombus/  by agitated saling and color doppler there was a PFO   VIDEO BRONCHOSCOPY WITH ENDOBRONCHIAL NAVIGATION Bilateral 12/25/2023   Procedure: VIDEO BRONCHOSCOPY WITH ENDOBRONCHIAL NAVIGATION;  Surgeon: Shelah Lamar RAMAN, MD;  Location: [facility] ENDOSCOPY;  Service: Pulmonary;  Laterality: Bilateral;    I have reviewed the social history and family history with the patient and they are unchanged from previous note.  ALLERGIES:  has no known allergies.  MEDICATIONS:  Current Outpatient Medications  Medication Sig Dispense Refill   acetaminophen  (TYLENOL ) 500 MG tablet Take 2 tablets (1,000 mg total) by mouth every 6 (six) hours as needed. 30 tablet 0   dexamethasone  (DECADRON ) 4 MG tablet Take 1 tablet 2 times daily starting day before pemetrexed . Then take 2 tablets daily x 3 days starting day after carboplatin . Take with food. 30 tablet 1   folic acid  (FOLVITE ) 1 MG tablet Take 1 tablet (1 mg total) by mouth daily. Start 7 days  before pemetrexed  chemotherapy. Continue until 21 days after pemetrexed  completed. 100 tablet 3   hydrochlorothiazide  (HYDRODIURIL ) 25 MG tablet Take 1 tablet (25 mg total) by mouth daily for hypertension. 90 tablet 1   lidocaine -prilocaine  (EMLA ) cream Apply to affected area once 30 g 3   lisinopril  (ZESTRIL ) 10 MG tablet Take 1 tablet (10 mg total) by mouth daily for hypertension. 30 tablet 0   Multiple Vitamin (MULTIVITAMIN WITH MINERALS) TABS tablet Take 1 tablet by mouth in the morning.     ondansetron  (ZOFRAN ) 8 MG tablet Take 1 tablet (8 mg total) by mouth every 8 (eight) hours as needed for nausea or vomiting. 10 tablet 0   ondansetron  (ZOFRAN ) 8 MG tablet Take 1 tablet (8 mg total) by mouth every 8 (eight) hours as needed for nausea or vomiting. Start on the third day after carboplatin . 30 tablet 1   polyethylene glycol (MIRALAX / GLYCOLAX) 17 g packet Take 17 g by mouth in the morning.     prochlorperazine  (COMPAZINE ) 10 MG tablet Take 1 tablet (  10 mg total) by mouth every 6 (six) hours as needed for nausea or vomiting. 30 tablet 1   timolol  (TIMOPTIC ) 0.5 % ophthalmic solution Place 1 drop into the left eye daily. 10 mL 11   timolol  (TIMOPTIC ) 0.5 % ophthalmic solution Place 1 drop into the left eye every morning. 10 mL 11   moxifloxacin  (VIGAMOX ) 0.5 % ophthalmic solution Place 1 drop into the left eye 4 (four) times daily for 10 days. 5 mL 5   No current facility-administered medications for this visit.    PHYSICAL EXAMINATION: ECOG PERFORMANCE STATUS: 1 - Symptomatic but completely ambulatory  Vitals:   02/08/24 0804  BP: 126/70  Pulse: 72  Resp: 17  Temp: 97.8 F (36.6 C)  SpO2: 96%   Wt Readings from Last 3 Encounters:  02/08/24 128 lb 1.6 oz (58.1 kg)  01/23/24 124 lb 11.2 oz (56.6 kg)  01/18/24 123 lb 11.2 oz (56.1 kg)     GENERAL:alert, no distress and comfortable SKIN: skin color, texture, turgor are normal, no rashes or significant lesions EYES: normal,  Conjunctiva are pink and non-injected, sclera clear NECK: supple, thyroid  normal size, non-tender, without nodularity LYMPH:  no palpable lymphadenopathy in the cervical, axillary  LUNGS: clear to auscultation and percussion with normal breathing effort HEART: regular rate & rhythm and no murmurs and no lower extremity edema ABDOMEN:abdomen soft, non-tender and normal bowel sounds Musculoskeletal:no cyanosis of digits and no clubbing  NEURO: alert & oriented x 3 with fluent speech, no focal motor/sensory deficits  Physical Exam SKIN: Hives on back, arms, chest, abdomen, and inner legs. Bruising present.  LABORATORY DATA:  I have reviewed the data as listed    Latest Ref Rng & Units 02/08/2024    7:38 AM 01/23/2024   10:10 AM 01/18/2024    8:13 AM  CBC  WBC 4.0 - 10.5 K/uL 6.5  6.2  8.4   Hemoglobin 13.0 - 17.0 g/dL 89.2  87.9  88.2   Hematocrit 39.0 - 52.0 % 32.0  36.1  34.8   Platelets 150 - 400 K/uL 202  198  234         Latest Ref Rng & Units 02/08/2024    7:38 AM 01/18/2024    8:13 AM 12/25/2023   12:08 PM  CMP  Glucose 70 - 99 mg/dL 884  897  83   BUN 8 - 23 mg/dL 14  17  15    Creatinine 0.61 - 1.24 mg/dL 9.01  8.96  8.71   Sodium 135 - 145 mmol/L 137  136  137   Potassium 3.5 - 5.1 mmol/L 4.5  4.1  4.1   Chloride 98 - 111 mmol/L 105  105  103   CO2 22 - 32 mmol/L 24  26  23    Calcium 8.9 - 10.3 mg/dL 9.5  9.4  9.2   Total Protein 6.5 - 8.1 g/dL 7.0  6.7    Total Bilirubin 0.0 - 1.2 mg/dL 0.2  0.3    Alkaline Phos 38 - 126 U/L 101  97    AST 15 - 41 U/L 25  17    ALT 0 - 44 U/L 20  11        RADIOGRAPHIC STUDIES: I have personally reviewed the radiological images as listed and agreed with the findings in the report. No results found.    Orders Placed This Encounter  Procedures   CBC with Differential (Cancer Center Only)    Standing Status:   Future  Expected Date:   04/10/2024    Expiration Date:   04/10/2025   CMP (Cancer Center only)    Standing  Status:   Future    Expected Date:   04/10/2024    Expiration Date:   04/10/2025   All questions were answered. The patient knows to call the clinic with any problems, questions or concerns. No barriers to learning was detected. The total time spent in the appointment was 30 minutes, including review of chart and various tests results, discussions about plan of care and coordination of care plan     Onita Mattock, MD 02/08/2024

## 2024-02-09 ENCOUNTER — Inpatient Hospital Stay: Admitting: Licensed Clinical Social Worker

## 2024-02-09 DIAGNOSIS — C3492 Malignant neoplasm of unspecified part of left bronchus or lung: Secondary | ICD-10-CM

## 2024-02-09 NOTE — Progress Notes (Signed)
 [facility] CSW Progress Note  Clinical Child Psychotherapist contacted patient by phone to follow-up on Medicare questions.    Interventions: Pt currently is taking intermittent FMLA and would like to retire.  CSW informed pt it is currently open enrollment for Medicare and provided contact details for [facility] to see if he may be able to sign up for Medicare and when it would take affect.  Pt does not have short term disability benefits.  At present pt reports he is tolerating treatment well and has not needed to take a lot of time off.  Pt provided w/ contact information for CSW as well should he have additional questions.        Follow Up Plan:  Patient will contact CSW with any support or resource needs    [name] JONELLE Manna, LCSW Clinical Social Worker [facility]

## 2024-02-11 ENCOUNTER — Other Ambulatory Visit: Payer: Self-pay

## 2024-02-11 ENCOUNTER — Emergency Department (HOSPITAL_COMMUNITY)

## 2024-02-11 ENCOUNTER — Encounter (HOSPITAL_COMMUNITY): Payer: Self-pay

## 2024-02-11 ENCOUNTER — Emergency Department (HOSPITAL_COMMUNITY)
Admission: EM | Admit: 2024-02-11 | Discharge: 2024-02-11 | Disposition: A | Discharge status: Home / Self Care | Attending: Emergency Medicine | Admitting: Emergency Medicine

## 2024-02-11 DIAGNOSIS — I10 Essential (primary) hypertension: Secondary | ICD-10-CM | POA: Diagnosis not present

## 2024-02-11 DIAGNOSIS — R918 Other nonspecific abnormal finding of lung field: Secondary | ICD-10-CM | POA: Diagnosis not present

## 2024-02-11 DIAGNOSIS — Z85118 Personal history of other malignant neoplasm of bronchus and lung: Secondary | ICD-10-CM | POA: Insufficient documentation

## 2024-02-11 DIAGNOSIS — Z79899 Other long term (current) drug therapy: Secondary | ICD-10-CM | POA: Insufficient documentation

## 2024-02-11 DIAGNOSIS — R058 Other specified cough: Secondary | ICD-10-CM | POA: Diagnosis not present

## 2024-02-11 DIAGNOSIS — R509 Fever, unspecified: Secondary | ICD-10-CM | POA: Diagnosis not present

## 2024-02-11 DIAGNOSIS — U071 COVID-19: Secondary | ICD-10-CM | POA: Insufficient documentation

## 2024-02-11 DIAGNOSIS — R531 Weakness: Secondary | ICD-10-CM | POA: Diagnosis not present

## 2024-02-11 LAB — COMPREHENSIVE METABOLIC PANEL WITH GFR
ALT: 27 U/L (ref 0–44)
AST: 29 U/L (ref 15–41)
Albumin: 4 g/dL (ref 3.5–5.0)
Alkaline Phosphatase: 90 U/L (ref 38–126)
Anion gap: 10 (ref 5–15)
BUN: 15 mg/dL (ref 8–23)
CO2: 24 mmol/L (ref 22–32)
Calcium: 9.3 mg/dL (ref 8.9–10.3)
Chloride: 98 mmol/L (ref 98–111)
Creatinine, Ser: 1.07 mg/dL (ref 0.61–1.24)
GFR, Estimated: >60 mL/min
Glucose, Bld: 95 mg/dL (ref 70–99)
Potassium: 4 mmol/L (ref 3.5–5.1)
Sodium: 132 mmol/L — ABNORMAL LOW (ref 135–145)
Total Bilirubin: 0.4 mg/dL (ref 0.0–1.2)
Total Protein: 6.7 g/dL (ref 6.5–8.1)

## 2024-02-11 LAB — CBC WITH DIFFERENTIAL/PLATELET
Abs Immature Granulocytes: 0.02 K/uL (ref 0.00–0.07)
Basophils Absolute: 0 K/uL (ref 0.0–0.1)
Basophils Relative: 0 %
Eosinophils Absolute: 0 K/uL (ref 0.0–0.5)
Eosinophils Relative: 0 %
HCT: 34.6 % — ABNORMAL LOW (ref 39.0–52.0)
Hemoglobin: 11.3 g/dL — ABNORMAL LOW (ref 13.0–17.0)
Immature Granulocytes: 0 %
Lymphocytes Relative: 16 %
Lymphs Abs: 0.7 K/uL (ref 0.7–4.0)
MCH: 28.1 pg (ref 26.0–34.0)
MCHC: 32.7 g/dL (ref 30.0–36.0)
MCV: 86.1 fL (ref 80.0–100.0)
Monocytes Absolute: 0.3 K/uL (ref 0.1–1.0)
Monocytes Relative: 6 %
Neutro Abs: 3.6 K/uL (ref 1.7–7.7)
Neutrophils Relative %: 78 %
Platelets: 143 K/uL — ABNORMAL LOW (ref 150–400)
RBC: 4.02 MIL/uL — ABNORMAL LOW (ref 4.22–5.81)
RDW: 14.6 % (ref 11.5–15.5)
WBC: 4.6 K/uL (ref 4.0–10.5)
nRBC: 0 % (ref 0.0–0.2)

## 2024-02-11 LAB — URINALYSIS, W/ REFLEX TO CULTURE (INFECTION SUSPECTED)
Bacteria, UA: NONE SEEN
Bilirubin Urine: NEGATIVE
Glucose, UA: NEGATIVE mg/dL
Hgb urine dipstick: NEGATIVE
Ketones, ur: NEGATIVE mg/dL
Leukocytes,Ua: NEGATIVE
Nitrite: NEGATIVE
Protein, ur: 30 mg/dL — AB
Specific Gravity, Urine: 1.021 (ref 1.005–1.030)
pH: 6 (ref 5.0–8.0)

## 2024-02-11 LAB — I-STAT CG4 LACTIC ACID, ED: Lactic Acid, Venous: 0.9 mmol/L (ref 0.5–1.9)

## 2024-02-11 LAB — RESP PANEL BY RT-PCR (RSV, FLU A&B, COVID)  RVPGX2
Influenza A by PCR: NEGATIVE
Influenza B by PCR: NEGATIVE
Resp Syncytial Virus by PCR: NEGATIVE
SARS Coronavirus 2 by RT PCR: POSITIVE — AB

## 2024-02-11 LAB — PROTIME-INR
INR: 1 (ref 0.8–1.2)
Prothrombin Time: 13.3 s (ref 11.4–15.2)

## 2024-02-11 MED ORDER — SODIUM CHLORIDE 0.9 % IV SOLN
2.0000 g | Freq: Once | INTRAVENOUS | Status: AC
  Administered 2024-02-11: 2 g via INTRAVENOUS
  Filled 2024-02-11: qty 12.5

## 2024-02-11 MED ORDER — VANCOMYCIN HCL IN DEXTROSE 1-5 GM/200ML-% IV SOLN
1000.0000 mg | Freq: Once | INTRAVENOUS | Status: AC
  Administered 2024-02-11: 1000 mg via INTRAVENOUS
  Filled 2024-02-11: qty 200

## 2024-02-11 MED ORDER — SODIUM CHLORIDE 0.9 % IV SOLN: INTRAVENOUS | Status: DC

## 2024-02-11 MED ORDER — SODIUM CHLORIDE 0.9 % IV BOLUS (SEPSIS)
1000.0000 mL | Freq: Once | INTRAVENOUS | Status: AC
  Administered 2024-02-11: 1000 mL via INTRAVENOUS

## 2024-02-11 MED ORDER — ACETAMINOPHEN 325 MG PO TABS
650.0000 mg | ORAL_TABLET | Freq: Once | ORAL | Status: AC
  Administered 2024-02-11: 650 mg via ORAL
  Filled 2024-02-11: qty 2

## 2024-02-11 NOTE — Sepsis Progress Note (Signed)
 Sepsis protocol is being followed by eLink.

## 2024-02-11 NOTE — Discharge Instructions (Signed)
 Continue Tylenol  and ibuprofen  for fever.  Overall if you feel worse please return for evaluation or talk with your oncology team or primary care team.

## 2024-02-11 NOTE — ED Notes (Signed)
 Pt stated he wants his port accessed.

## 2024-02-11 NOTE — ED Notes (Signed)
 Patient hx lung cancer, last chemo tx Friday, port access to right chest, fever/body aches started today. Denies n/v/dysuria. Patient is alert and oriented x 4. Airway patent, respirations even and unlabored. Skin normal, warm and dry. Patient has no new complaints at this time. Siderails up x 2. Call light at bedside.

## 2024-02-11 NOTE — ED Provider Notes (Signed)
 [place] EMERGENCY DEPARTMENT AT [facility] Provider Note   CSN: [id number] Arrival date & time: 02/11/24  1452     Patient presents with: Fever and Weakness   Juan David is a 68 y.o. male.   Patient here with fever 100.5 at home.  He had chemotherapy on Thursday.  He is being treated for lung cancer.  This was his second cycle of treatment.  He is having worsening cough sputum production.  He denies any abdominal pain nausea vomiting diarrhea.  No neck pain no headache.  No pain with urination.  He does have history of hypertension  The history is provided by the patient.       Prior to Admission medications   Medication Sig Start Date End Date Taking? Authorizing Provider  acetaminophen  (TYLENOL ) 500 MG tablet Take 2 tablets (1,000 mg total) by mouth every 6 (six) hours as needed. 02/03/21   Tammy Sor, PA-C  dexamethasone  (DECADRON ) 4 MG tablet Take 1 tablet 2 times daily starting day before pemetrexed . Then take 2 tablets daily x 3 days starting day after carboplatin . Take with food. 01/08/24   [name], MD  folic acid  (FOLVITE ) 1 MG tablet Take 1 tablet (1 mg total) by mouth daily. Start 7 days before pemetrexed  chemotherapy. Continue until 21 days after pemetrexed  completed. 01/08/24   [name], MD  hydrochlorothiazide  (HYDRODIURIL ) 25 MG tablet Take 1 tablet (25 mg total) by mouth daily for hypertension. 07/26/23     lidocaine -prilocaine  (EMLA ) cream Apply to affected area once 01/08/24   [name], MD  lisinopril  (ZESTRIL ) 10 MG tablet Take 1 tablet (10 mg total) by mouth daily for hypertension. 01/17/24     moxifloxacin  (VIGAMOX ) 0.5 % ophthalmic solution Place 1 drop into the left eye 4 (four) times daily for 10 days. 05/24/22     Multiple Vitamin (MULTIVITAMIN WITH MINERALS) TABS tablet Take 1 tablet by mouth in the morning.    [provider]  ondansetron  (ZOFRAN ) 8 MG tablet Take 1 tablet (8 mg total) by mouth every 8 (eight) hours as needed  for nausea or vomiting. 06/26/23   Hanford Powell BRAVO, NP  ondansetron  (ZOFRAN ) 8 MG tablet Take 1 tablet (8 mg total) by mouth every 8 (eight) hours as needed for nausea or vomiting. Start on the third day after carboplatin . 01/08/24   [name], MD  polyethylene glycol (MIRALAX / GLYCOLAX) 17 g packet Take 17 g by mouth in the morning.    [provider]  prochlorperazine  (COMPAZINE ) 10 MG tablet Take 1 tablet (10 mg total) by mouth every 6 (six) hours as needed for nausea or vomiting. 01/08/24   [name], MD  timolol  (TIMOPTIC ) 0.5 % ophthalmic solution Place 1 drop into the left eye daily. 10/01/21     timolol  (TIMOPTIC ) 0.5 % ophthalmic solution Place 1 drop into the left eye every morning. 03/21/23   [name], MD    Allergies: Patient has no known allergies.    Review of Systems  Updated Vital Signs BP (!) 150/92   Pulse 87   Temp 99.4 F (37.4 C) (Oral)   Resp 16   SpO2 97%   Physical Exam Vitals and nursing note reviewed.  Constitutional:      General: He is not in acute distress.    Appearance: He is well-developed. He is not ill-appearing.  HENT:     Head: Normocephalic and atraumatic.     Nose: Nose normal.     Mouth/Throat:  Mouth: Mucous membranes are moist.  Eyes:     Extraocular Movements: Extraocular movements intact.     Conjunctiva/sclera: Conjunctivae normal.     Pupils: Pupils are equal, round, and reactive to light.  Cardiovascular:     Rate and Rhythm: Normal rate and regular rhythm.     Pulses: Normal pulses.     Heart sounds: Normal heart sounds. No murmur heard. Pulmonary:     Effort: Pulmonary effort is normal. No respiratory distress.     Breath sounds: Normal breath sounds.  Abdominal:     General: Abdomen is flat.     Palpations: Abdomen is soft.     Tenderness: There is no abdominal tenderness.  Musculoskeletal:        General: No swelling.     Cervical back: Normal range of motion and neck supple.  Skin:    General: Skin is warm  and dry.     Capillary Refill: Capillary refill takes less than 2 seconds.  Neurological:     General: No focal deficit present.     Mental Status: He is alert and oriented to person, place, and time.     Cranial Nerves: No cranial nerve deficit.     Sensory: No sensory deficit.     Motor: No weakness.     Coordination: Coordination normal.  Psychiatric:        Mood and Affect: Mood normal.     (all labs ordered are listed, but only abnormal results are displayed) Labs Reviewed  RESP PANEL BY RT-PCR (RSV, FLU A&B, COVID)  [id number] - Abnormal; Notable for the following components:      Result Value   SARS Coronavirus 2 by RT PCR POSITIVE (*)    All other components within normal limits  COMPREHENSIVE METABOLIC PANEL WITH GFR - Abnormal; Notable for the following components:   Sodium 132 (*)    All other components within normal limits  CBC WITH DIFFERENTIAL/PLATELET - Abnormal; Notable for the following components:   RBC 4.02 (*)    Hemoglobin 11.3 (*)    HCT 34.6 (*)    Platelets 143 (*)    All other components within normal limits  URINALYSIS, W/ REFLEX TO CULTURE (INFECTION SUSPECTED) - Abnormal; Notable for the following components:   Protein, ur 30 (*)    All other components within normal limits  CULTURE, BLOOD (ROUTINE X 2)  CULTURE, BLOOD (ROUTINE X 2)  PROTIME-INR  I-STAT CG4 LACTIC ACID, ED  I-STAT CG4 LACTIC ACID, ED    EKG: None  Radiology: [facility] Chest Port 1 View Result Date: 02/11/2024 CLINICAL DATA:  Fever, weakness, and hypertension. Productive cough. Possible sepsis. EXAM: PORTABLE CHEST 1 VIEW COMPARISON:  12/25/2023. FINDINGS: The heart size and mediastinal contours are within normal limits. There is atherosclerotic calcification of the aorta. A lobular opacity is noted in the mid left lung, unchanged from the prior exam and compatible with known mass. No consolidation, effusion, or pneumothorax is seen. A right chest port terminates over the superior vena  cava. No acute osseous abnormality. IMPRESSION: 1. No active disease. 2. Stable lobular opacity in the left upper lobe, compatible with known mass. Electronically Signed   By: Leita Birmingham M.D.   On: 02/11/2024 16:45     Procedures   Medications Ordered in the ED  vancomycin  (VANCOCIN ) IVPB 1000 mg/200 mL premix (1,000 mg Intravenous New Bag/Given 02/11/24 1649)  0.9 %  sodium chloride  infusion ( Intravenous New Bag/Given 02/11/24 1657)  sodium chloride  0.9 %  bolus 1,000 mL (1,000 mLs Intravenous New Bag/Given 02/11/24 1611)  ceFEPIme  (MAXIPIME ) 2 g in sodium chloride  0.9 % 100 mL IVPB (0 g Intravenous Stopped 02/11/24 1646)  acetaminophen  (TYLENOL ) tablet 650 mg (650 mg Oral Given 02/11/24 1653)                                    Medical Decision Making Amount and/or Complexity of Data Reviewed Labs: ordered. Radiology: ordered.  Risk OTC drugs. Prescription drug management.   Juan David is here with fever.  On chemotherapy for lung cancer.  Just had last chemotherapy several days ago.  This was the second cycle.  Temperature of 100.5 at home.  Differential diagnosis likely neutropenic fever or some other opportunistic process.  He has got reassuring vitals here.  Normal blood pressure.  Heart rate in the low 90s.  Sepsis workup initiated with broad-spectrum IV antibiotics and IV fluids initiated.  He has no pain with urination.  Does have chronic cough with some change in sputum production now.  No sick contacts otherwise.  Well-appearing.  Lab work actually reassuring.  Patient is positive for COVID.  Has no leukocytosis no lactic acidosis.  Chest x-ray shows no evidence of pneumonia.  I reviewed interpreted labs and imaging.  He has no significant leukocytosis anemia or electrolyte abnormality otherwise.  I talked with Dr. Cloretta with oncology given that we found viral source and has got reassuring vitals and reassuring lab work we are okay with discharge to home with symptomatic  support.  Patient declined any antiviral treatment which I think is reasonable as well.  Continue supportive care at home.  Understands return precautions.  Blood cultures have been collected.  Discharge.  This chart was dictated using voice recognition software.  Despite best efforts to proofread,  errors can occur which can change the documentation meaning.      Final diagnoses:  COVID-19    ED Discharge Orders     None          Ruthe Cornet, DO 02/11/24 1725

## 2024-02-11 NOTE — ED Triage Notes (Addendum)
 Pt came in for a fever, weakness, and hypertension. Pt received chemotherapy on Thursday and has been weak since. Pt's temperatire was 100.4 and pressure at 154/90. Pt has also had a headache x2 days and is coughing up gray mucus today.

## 2024-02-11 NOTE — ED Notes (Signed)
 Patient alert and oriented x 4. Airway patent, respirations even and unlabored. Skin normal, warm and dry. Discharge instructions discussed with patient and patient's family. Patient has no questions at this time. Patient has safe ride home.

## 2024-02-12 LAB — BLOOD CULTURE ID PANEL (REFLEXED) - BCID2

## 2024-02-12 NOTE — ED Notes (Addendum)
 02/12/24 1825 received call from [facility] with abnormal lab value. Information given to Dr Francesca (staph epidermidis)   Spoke with pt and wife, pt states he feels better today. They are aware if condition starts to worsen to return to the ED. Verbal agreement from both pt and wife.

## 2024-02-13 DIAGNOSIS — C3492 Malignant neoplasm of unspecified part of left bronchus or lung: Secondary | ICD-10-CM | POA: Diagnosis not present

## 2024-02-14 LAB — CULTURE, BLOOD (ROUTINE X 2)

## 2024-02-15 ENCOUNTER — Telehealth (HOSPITAL_BASED_OUTPATIENT_CLINIC_OR_DEPARTMENT_OTHER): Payer: Self-pay | Admitting: *Deleted

## 2024-02-15 NOTE — Telephone Encounter (Addendum)
 Post ED Visit - Positive Culture Follow-up  Culture report reviewed by antimicrobial stewardship pharmacist: [facility] []  Rankin Dee, Pharm.D. []  Venetia Gully, Pharm.D., BCPS AQ-ID []  Garrel Crews, Pharm.D., BCPS []  Almarie Lunger, Pharm.D., BCPS []  [place], [address].D., BCPS, AAHIVP []  Rosaline Bihari, Pharm.D., BCPS, AAHIVP []  Vernell Meier, PharmD, BCPS []  Latanya Hint, PharmD, BCPS []  Donald Medley, PharmD, BCPS []  Rocky Bold, PharmD []  Dorothyann Alert, PharmD, BCPS []  Morene Babe, PharmD  [facility] [x]  Damien Quiet, PharmD []  Romona Bliss, PharmD []  Dolphus Roller, PharmD []  Veva Seip, Rph []  Vernell Daunt) Leonce, PharmD []  Eva Allis, PharmD []  Rosaline Millet, PharmD []  Iantha Batch, PharmD []  Arvin Gauss, PharmD []  Wanda Hasting, PharmD []  Ronal Rav, PharmD []  Rocky Slade, PharmD []  Bard Jeans, PharmD   Positive Blood culture Likely Contaminant no further patient follow-up is required at this time.  Jama Lisle Blondie 02/15/2024, 11:08 AM

## 2024-02-16 ENCOUNTER — Other Ambulatory Visit: Payer: Self-pay

## 2024-02-16 ENCOUNTER — Inpatient Hospital Stay: Admitting: Hematology

## 2024-02-16 DIAGNOSIS — C3492 Malignant neoplasm of unspecified part of left bronchus or lung: Secondary | ICD-10-CM | POA: Diagnosis not present

## 2024-02-16 DIAGNOSIS — Z5111 Encounter for antineoplastic chemotherapy: Secondary | ICD-10-CM | POA: Diagnosis not present

## 2024-02-16 LAB — CULTURE, BLOOD (ROUTINE X 2)
Culture: NO GROWTH
Special Requests: ADEQUATE

## 2024-02-16 NOTE — Progress Notes (Signed)
 [facility]   [phone] Fax:([phone]) [phone]   Clinic Follow up Note   Patient Care Team: [name], MD as PCP - General (Family Medicine) [name], MD as Consulting Physician (General Surgery) [name], MD as Consulting Physician (Radiation Oncology) [name], MD as Consulting Physician (Hematology) [name], MD as Consulting Physician (Gastroenterology) [name], MD as Consulting Physician (General Surgery) 02/16/2024  I connected with [name] on 02/16/2024 at 11:20 AM EST by telephone and verified that I am speaking with the correct person using two identifiers.   I discussed the limitations, risks, security and privacy concerns of performing an evaluation and management service by telephone and the availability of in person appointments. I also discussed with the patient that there may be a patient responsible charge related to this service. The patient expressed understanding and agreed to proceed.   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  She is a light smoker. - CT 12/04/2013 scan showed 3.9 cm mass in the left upper lobe lung with numerous satellite nodules, and bilateral subcentimeter lung nodules.  -PET scan with hypermetabolic left upper lobe lung mass, no other distant metastasis and small lung nodules are not hypermetabolic on PET. -Patient underwent bronchoscopy and a biopsy on December 25, 2023, the biopsy of the left upper lung mass and right lower lobe lung nodule both showed adenocarcinoma, TTF-1 positive, CDX2 negative, supporting lung primary. -NGS [facility] showed no  targetable mutation except ATM, likely germline mutation.   Assessment & Plan Stage IV non-small cell lung cancer Undergoing active treatment with carboplatin , pemetrexed , and pembrolizumab . Recently completed second cycle of chemotherapy. Current side effects include rash and fatigue. - Continued current chemotherapy regimen as scheduled.  Chemotherapy-induced rash Developed a non-painful, minimally pruritic rash after the second chemotherapy cycle, which darkened after discontinuing dexamethasone . No significant discomfort. Rash is likely chemotherapy-related and expected to resolve after treatment completion. - Recommended antihistamines (e.g., diphenhydramine  or cetirizine) as needed for symptoms. - Advised rash will likely resolve after chemotherapy is completed. - Requested he send photographs of the rash via MyChart if possible.  COVID-19 infection Diagnosed four days ago after presenting with fever. Did not require specific therapy. Fever has resolved, with improving symptoms and adequate oral intake. - Encouraged adequate nutrition and hydration.  Chemotherapy-related fatigue Experiencing increased and prolonged fatigue following the second chemotherapy cycle, exacerbated by recent COVID-19 infection. Recovery is slower than after the first cycle, but he maintains oral intake. - Encouraged continued activity as tolerated. - Advised that fatigue may take longer to resolve due to cumulative chemotherapy and recent COVID-19 infection.  Plan -I encourage pt to send me some pictures of his rash.  His rash is not bothersome, we will monitor clinically.  It is likely related to chemotherapy. - Follow-up in 2 weeks before next cycle chemo.   SUMMARY OF ONCOLOGIC HISTORY: Oncology History Overview Note  Cancer Staging Rectal adenocarcinoma [facility]) Staging form: Colon and Rectum, AJCC 8th Edition - Pathologic stage from 09/01/2016: Stage I (pT2, pN0, cM0) - Signed by [name], MD on  10/01/2016     Adenocarcinoma of rectum (HCC)  09/01/2016 Initial Diagnosis   Rectal adenocarcinoma (HCC)  09/01/2016 Surgery   Hemorrhoidectomy 09/01/2016. Noted to have a mobile right lateral anal canal mass. Dr. Bernarda Ned.    09/01/2016 Pathology Results   Invasive adenocarcinoma, well-differentiated, spanning 1.3 cm. The tumor invaded into the anal sphincter muscle. Resection margins were negative. The pathologist notes the tumor would be best staged as pT2 given involvement of muscle.    09/06/2016 Imaging   CT scans chest/abdomen/pelvis 09/06/2016 showed no evidence of metastatic disease.    09/22/2016 Imaging   Pelvic MRI 09/22/2016 showed no definite residual anal tumor. There was no evidence of tumor involvement of the external sphincter or adjacent organs. There was no pelvic lymphadenopathy.   10/10/2016 - 11/22/2016 Radiation Therapy   Patient begun adjuvant radiation with Dr. Dewey   10/10/2016 - 11/22/2016 Chemotherapy   xeloda  1500mg , twice daily.   11/24/2017 Imaging   CT CAP W contrast 11/24/17  IMPRESSION: 1. Stable exam. No new or progressive interval findings to suggest metastatic disease. 2.  Aortic Atherosclerois (ICD10-170.0) 3.  Emphysema. (PRI89-G56.9)    NSCLC of left lung (HCC)  12/25/2023 Cancer Staging   Staging form: Lung, AJCC V9 - Clinical stage from 12/25/2023: Stage IVA (cT3, cN0, cM1a) - Signed by [name], MD on 12/31/2023   12/31/2023 Initial Diagnosis   NSCLC of left lung (HCC)   01/18/2024 -  Chemotherapy   Patient is on Treatment Plan : LUNG Carboplatin  (5) + Pemetrexed  (500) + Pembrolizumab  (200) D1 q21d Induction x 4 cycles / Maintenance Pemetrexed  (500) + Pembrolizumab  (200) D1 q21d       Discussed the use of AI scribe software for clinical note transcription with the patient, who gave verbal consent to proceed.  History of Present Illness Juan David is a 68 year old male with stage IV non-small cell lung cancer receiving  systemic therapy who presents for evaluation of chemotherapy-induced rash.  He is on carboplatin , pemetrexed , and pembrolizumab  and received cycle 2 eight days ago. After completing a three-day dexamethasone  taper post-chemotherapy, he developed a new rash that has darkened in color. It is not painful or significantly pruritic, not bothersome, and he has not used antihistamines. He denies burning, discomfort, or skin breakdown.  Since this most recent cycle he has had more pronounced and prolonged fatigue than with prior treatment. He has not gone to work this week because of fatigue but maintains adequate oral intake.  Four days ago he developed fever and was diagnosed with COVID-19 in the emergency room. He did not receive antivirals. His fever has resolved and respiratory symptoms are improving, though his overall recovery feels slower than after his first chemotherapy cycle.     REVIEW OF SYSTEMS:   Constitutional: Denies fevers, chills or abnormal weight loss Eyes: Denies blurriness of vision Ears, nose, mouth, throat, and face: Denies mucositis or sore throat Respiratory: Denies cough, dyspnea or wheezes Cardiovascular: Denies palpitation, chest discomfort or lower extremity swelling Gastrointestinal:  Denies nausea, heartburn or change in bowel habits Skin: Denies abnormal skin rashes Lymphatics: Denies new lymphadenopathy or easy bruising Neurological:Denies numbness, tingling or new weaknesses Behavioral/Psych: Mood is stable, no new changes  All other systems were reviewed with the patient and are negative.  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   Hypertension    Iron deficiency    Iron overload    iron deposits on liver   Legally blind in left eye, as defined in [place]     PFO (patent foramen ovale)    per TEE done 05-11-2009    Rectal adenocarcinoma (HCC) 09/28/2016   Traumatic glaucoma, 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 at [facility] in [place]   08-18-2001   Wears glasses     SURGICAL HISTORY: Past Surgical History:  Procedure Laterality Date   ARTHROSCOPIC REPAIR ACL Right 1998   BIOPSY  03/16/2021   Procedure: BIOPSY;  Surgeon: [name], MD;  Location: [facility];  Service: Endoscopy;;   ESOPHAGOGASTRODUODENOSCOPY (EGD) WITH PROPOFOL  N/A 03/16/2021   Procedure: ESOPHAGOGASTRODUODENOSCOPY (EGD) WITH PROPOFOL ;  Surgeon: [name], MD;  Location: [facility];  Service: Endoscopy;  Laterality: N/A;   EXPLORATION AND REPAIR LEFT EYE INJURY  08-18-2001   [place]   ruptured globe- repair corneal laceration, reposition of prolapsed uveal tissue   HEMORRHOID SURGERY N/A 09/01/2016   Procedure: HEMORRHOIDECTOMY;  Surgeon: [name], MD;  Location: [facility];  Service: General;  Laterality: N/A;   IR IMAGING GUIDED PORT INSERTION  01/16/2024   JOINT REPLACEMENT Right    knee   LAPAROTOMY N/A 12/04/2020   Procedure: EXPLORATORY LAPAROTOMY small bowel resection;  Surgeon: [name], MD;  Location: [facility] OR;  Service: General;  Laterality: N/A;   SUPERFICIAL KERATECTOMY Left 06-29-2011    [facility]    with EDTA scrub of left eye   TEE WITHOUT CARDIOVERSION  05-11-2009  dr vina gull   LVSEF 55-65%/  mild thickened AV without AI/  trace MR/ mixed fixed artherosclerosis plaqueing thoracic aorta/  no evidence thrombus/  by agitated saling and color doppler there was a PFO   VIDEO BRONCHOSCOPY WITH ENDOBRONCHIAL NAVIGATION Bilateral 12/25/2023   Procedure: VIDEO BRONCHOSCOPY WITH ENDOBRONCHIAL NAVIGATION;  Surgeon: Shelah Lamar RAMAN, MD;  Location[facility];  Service: Pulmonary;  Laterality: Bilateral;    I have reviewed the social history and family history with the patient and they are unchanged from previous note.  ALLERGIES:  has no known allergies.  MEDICATIONS:  Current Outpatient Medications  Medication Sig Dispense Refill   acetaminophen  (TYLENOL ) 500 MG tablet Take 2 tablets (1,000 mg total) by mouth every 6 (six) hours as needed. 30 tablet 0   dexamethasone  (DECADRON ) 4 MG tablet Take 1 tablet 2 times daily starting day before pemetrexed . Then take 2 tablets daily x 3 days starting day after carboplatin . Take with food. 30 tablet 1   folic acid  (FOLVITE ) 1 MG tablet Take 1 tablet (1 mg total) by mouth daily. Start 7 days before pemetrexed  chemotherapy. Continue until 21 days after pemetrexed  completed. 100 tablet 3   hydrochlorothiazide  (HYDRODIURIL ) 25 MG tablet Take 1 tablet (25 mg total) by mouth daily for hypertension. 90 tablet 1   lidocaine -prilocaine  (EMLA ) cream Apply to affected area once 30 g 3   lisinopril  (ZESTRIL ) 10 MG tablet Take 1 tablet (10 mg total) by mouth daily for hypertension. 30 tablet 0   moxifloxacin  (VIGAMOX ) 0.5 % ophthalmic solution Place 1 drop into the left eye 4 (four) times daily for 10 days. 5 mL 5   Multiple Vitamin (MULTIVITAMIN WITH MINERALS) TABS  tablet Take 1 tablet by mouth in the morning.     ondansetron  (ZOFRAN ) 8 MG tablet Take 1 tablet (8 mg total) by mouth every 8 (eight) hours as needed for nausea or vomiting. 10 tablet 0   ondansetron  (ZOFRAN ) 8 MG tablet Take 1 tablet (8 mg total) by mouth every 8 (eight) hours as needed for nausea or vomiting. Start on the third day after carboplatin . 30 tablet 1   polyethylene glycol (MIRALAX / GLYCOLAX) 17 g packet Take 17 g by mouth in the morning.     prochlorperazine  (COMPAZINE ) 10 MG tablet Take 1 tablet (10 mg total) by mouth every 6 (six) hours as needed for nausea or vomiting. 30 tablet 1   timolol  (TIMOPTIC ) 0.5 % ophthalmic solution  Place 1 drop into the left eye daily. 10 mL 11   timolol  (TIMOPTIC ) 0.5 % ophthalmic solution Place 1 drop into the left eye every morning. 10 mL 11   No current facility-administered medications for this visit.    PHYSICAL EXAMINATION: Not performed   LABORATORY DATA:  I have reviewed the data as listed    Latest Ref Rng & Units 02/11/2024    3:46 PM 02/08/2024    7:38 AM 01/23/2024   10:10 AM  CBC  WBC 4.0 - 10.5 K/uL 4.6  6.5  6.2   Hemoglobin 13.0 - 17.0 g/dL 88.6  89.2  87.9   Hematocrit 39.0 - 52.0 % 34.6  32.0  36.1   Platelets 150 - 400 K/uL 143  202  198         Latest Ref Rng & Units 02/11/2024    3:46 PM 02/08/2024    7:38 AM 01/18/2024    8:13 AM  CMP  Glucose 70 - 99 mg/dL 95  884  897   BUN 8 - 23 mg/dL 15  14  17    Creatinine 0.61 - 1.24 mg/dL 8.92  9.01  8.96   Sodium 135 - 145 mmol/L 132  137  136   Potassium 3.5 - 5.1 mmol/L 4.0  4.5  4.1   Chloride 98 - 111 mmol/L 98  105  105   CO2 22 - 32 mmol/L 24  24  26    Calcium 8.9 - 10.3 mg/dL 9.3  9.5  9.4   Total Protein 6.5 - 8.1 g/dL 6.7  7.0  6.7   Total Bilirubin 0.0 - 1.2 mg/dL 0.4  0.2  0.3   Alkaline Phos 38 - 126 U/L 90  101  97   AST 15 - 41 U/L 29  25  17    ALT 0 - 44 U/L 27  20  11        RADIOGRAPHIC STUDIES: I have personally reviewed the radiological images as listed and agreed with the findings in the report. No results found.     I discussed the assessment and treatment plan with the patient. The patient was provided an opportunity to ask questions and all were answered. The patient agreed with the plan and demonstrated an understanding of the instructions.   The patient was advised to call back or seek an in-person evaluation if the symptoms worsen or if the condition fails to improve as anticipated.  I provided 10 minutes of non face-to-face telephone visit time during this encounter, including review of chart and various tests results, discussions about plan of care and coordination  of care plan.    Onita Mattock, MD 02/16/2024

## 2024-02-16 NOTE — Assessment & Plan Note (Signed)
 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  She is a light smoker. - CT 12/04/2013 scan showed 3.9 cm mass in the left upper lobe lung with numerous satellite nodules, and bilateral subcentimeter lung nodules.  -PET scan with hypermetabolic left upper lobe lung mass, no other distant metastasis and small lung nodules are not hypermetabolic on PET. -Patient underwent bronchoscopy and a biopsy on December 25, 2023, the biopsy of the left upper lung mass and right lower lobe lung nodule both showed adenocarcinoma, TTF-1 positive, CDX2 negative, supporting lung primary. -NGS [facility] showed no targetable mutation except ATM, likely germline mutation.

## 2024-02-23 ENCOUNTER — Other Ambulatory Visit (HOSPITAL_COMMUNITY): Payer: Self-pay

## 2024-02-23 ENCOUNTER — Other Ambulatory Visit (HOSPITAL_BASED_OUTPATIENT_CLINIC_OR_DEPARTMENT_OTHER): Payer: Self-pay

## 2024-02-23 ENCOUNTER — Other Ambulatory Visit: Payer: Self-pay

## 2024-02-23 MED ORDER — LISINOPRIL 10 MG PO TABS
ORAL_TABLET | ORAL | 3 refills | Status: AC
  Filled 2024-02-23 (×2): qty 90, 90d supply, fill #0

## 2024-02-25 ENCOUNTER — Other Ambulatory Visit: Payer: Self-pay

## 2024-02-26 ENCOUNTER — Other Ambulatory Visit (HOSPITAL_COMMUNITY): Payer: Self-pay

## 2024-02-26 MED ORDER — TIMOLOL MALEATE 0.5 % OP SOLN
OPHTHALMIC | 11 refills | Status: AC
  Filled 2024-02-26: qty 5, 90d supply, fill #0
  Filled 2024-04-08: qty 10, 90d supply, fill #0

## 2024-02-27 ENCOUNTER — Inpatient Hospital Stay (HOSPITAL_BASED_OUTPATIENT_CLINIC_OR_DEPARTMENT_OTHER): Admitting: Hematology

## 2024-02-27 ENCOUNTER — Inpatient Hospital Stay

## 2024-02-27 VITALS — BP 132/94 | HR 85 | Temp 98.2°F | Resp 16 | Ht 71.0 in | Wt 128.1 lb

## 2024-02-27 DIAGNOSIS — C3492 Malignant neoplasm of unspecified part of left bronchus or lung: Secondary | ICD-10-CM

## 2024-02-27 DIAGNOSIS — C20 Malignant neoplasm of rectum: Secondary | ICD-10-CM

## 2024-02-27 DIAGNOSIS — Z5111 Encounter for antineoplastic chemotherapy: Secondary | ICD-10-CM | POA: Diagnosis not present

## 2024-02-27 LAB — CBC WITH DIFFERENTIAL (CANCER CENTER ONLY)
Abs Immature Granulocytes: 0.02 K/uL (ref 0.00–0.07)
Basophils Absolute: 0 K/uL (ref 0.0–0.1)
Basophils Relative: 0 %
Eosinophils Absolute: 0 K/uL (ref 0.0–0.5)
Eosinophils Relative: 0 %
HCT: 30.4 % — ABNORMAL LOW (ref 39.0–52.0)
Hemoglobin: 10.2 g/dL — ABNORMAL LOW (ref 13.0–17.0)
Immature Granulocytes: 0 %
Lymphocytes Relative: 25 %
Lymphs Abs: 1.3 K/uL (ref 0.7–4.0)
MCH: 28.3 pg (ref 26.0–34.0)
MCHC: 33.6 g/dL (ref 30.0–36.0)
MCV: 84.2 fL (ref 80.0–100.0)
Monocytes Absolute: 0.9 K/uL (ref 0.1–1.0)
Monocytes Relative: 17 %
Neutro Abs: 3 K/uL (ref 1.7–7.7)
Neutrophils Relative %: 58 %
Platelet Count: 200 K/uL (ref 150–400)
RBC: 3.61 MIL/uL — ABNORMAL LOW (ref 4.22–5.81)
RDW: 15 % (ref 11.5–15.5)
WBC Count: 5.2 K/uL (ref 4.0–10.5)
nRBC: 0 % (ref 0.0–0.2)

## 2024-02-27 LAB — IRON AND IRON BINDING CAPACITY (CC-WL,HP ONLY)
Iron: 82 ug/dL (ref 45–182)
Saturation Ratios: 28 % (ref 17.9–39.5)
TIBC: 291 ug/dL (ref 250–450)
UIBC: 209 ug/dL

## 2024-02-27 LAB — CMP (CANCER CENTER ONLY)
ALT: 25 U/L (ref 0–44)
AST: 23 U/L (ref 15–41)
Albumin: 4.4 g/dL (ref 3.5–5.0)
Alkaline Phosphatase: 101 U/L (ref 38–126)
Anion gap: 9 (ref 5–15)
BUN: 22 mg/dL (ref 8–23)
CO2: 23 mmol/L (ref 22–32)
Calcium: 9.4 mg/dL (ref 8.9–10.3)
Chloride: 104 mmol/L (ref 98–111)
Creatinine: 1.24 mg/dL (ref 0.61–1.24)
GFR, Estimated: >60 mL/min
Glucose, Bld: 96 mg/dL (ref 70–99)
Potassium: 4.7 mmol/L (ref 3.5–5.1)
Sodium: 136 mmol/L (ref 135–145)
Total Bilirubin: 0.2 mg/dL (ref 0.0–1.2)
Total Protein: 7.3 g/dL (ref 6.5–8.1)

## 2024-02-27 LAB — TSH: TSH: 0.953 u[IU]/mL (ref 0.350–4.500)

## 2024-02-27 LAB — FERRITIN: Ferritin: 2965 ng/mL — ABNORMAL HIGH (ref 24–336)

## 2024-02-27 MED FILL — Fosaprepitant Dimeglumine For IV Infusion 150 MG (Base Eq): INTRAVENOUS | Qty: 5 | Status: AC

## 2024-02-27 NOTE — Progress Notes (Signed)
 " [facility]   [phone] [phone]   Clinic Follow up Note   Patient Care Team: [name], MD as PCP - General (Family Medicine) [name], MD as Consulting Physician (General Surgery) [name], MD as Consulting Physician (Radiation Oncology) [name], MD as Consulting Physician (Hematology) [name], MD as Consulting Physician (Gastroenterology) [name], MD as Consulting Physician (General Surgery)  Date of Service:  02/27/2024  CHIEF COMPLAINT: f/u of NSCLC   CURRENT THERAPY:  First-line chemotherapy carboplatin , pemetrexed  and pembrolizumab  every 21 days  Oncology History   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  She is a light smoker. - CT 12/04/2013 scan showed 3.9 cm mass in the left upper lobe lung with numerous satellite nodules, and bilateral subcentimeter lung nodules.  -PET scan with hypermetabolic left upper lobe lung mass, no other distant metastasis and small lung nodules are not hypermetabolic on PET. -Patient underwent bronchoscopy and a biopsy on December 25, 2023, the biopsy of the left upper lung mass and right lower lobe lung nodule both showed adenocarcinoma, TTF-1 positive, CDX2 negative, supporting lung primary. -NGS Tempus showed no targetable mutation except ATM, likely germline mutation.   Assessment & Plan Metastatic non-small cell lung cancer of the left lung He is receiving active chemotherapy and immunotherapy for metastatic non-small cell lung cancer. He has tolerated treatment well, with no significant new symptoms or complications. Hematologic parameters remain within acceptable ranges for ongoing therapy. Plans are in place to transition to maintenance therapy after cycle 4, pending response assessment. No acute issues related to cancer progression  or treatment toxicity were identified. - Proceed with cycle 3 of chemotherapy and immunotherapy as scheduled tomorrow. - Order CT scan with contrast in early January to assess response after cycle 3. - Transition to maintenance therapy (pemetrexed  and pembrolizumab ) after cycle 4, contingent on scan results. - Notify infusion center of recent COVID-19 infection prior to tomorrow's treatment. - Schedule follow-up visit in three weeks prior to next treatment cycle.  Chemotherapy-induced anemia He has mild anemia (hemoglobin 10.2 g/dL), slightly decreased from previous labs, consistent with chemotherapy-related etiology. He is asymptomatic and does not require transfusion.  Chemotherapy-related skin pigmentation change He developed flat, non-pruritic skin pigmentation changes on the back, chest, and inner thighs, likely secondary to chemotherapy (particularly pemetrexed ). These changes are asymptomatic and do not require intervention.  Recent COVID-19 infection He developed COVID-19 infection shortly after his last chemotherapy infusion, presenting with fever and positive swab. He has recovered fully, with resolution of symptoms and no sequelae. Blood cultures during the episode showed a single peripheral positive for Staphylococcus aureus, likely a contaminant, as the port culture was negative and clinical findings were consistent with COVID-19. - Notify infusion center of recent COVID-19 infection prior to chemotherapy. - Advise deferral of COVID-19 vaccination for at least 2-3 months post-infection, as natural infection confers antibody protection during this period. - Recommend shingles and RSV vaccinations may be administered at any time, preferably at least 4-5 days before the next chemotherapy cycle to allow for recovery from treatment.  Plan - He has recovered well from last cycle chemotherapy and COVID infection. - Lab reviewed, adequate for treatment, will proceed to cycle 3 treatment  tomorrow - Plan to repeat CT chest with contrast in 2 to 3 weeks - Follow-up in 3 weeks before cycle 4 chemo  SUMMARY OF ONCOLOGIC HISTORY: Oncology History Overview Note  Cancer Staging Rectal adenocarcinoma [facility]) Staging form: Colon and Rectum, AJCC 8th Edition - Pathologic stage from 09/01/2016: Stage I (pT2, pN0, cM0) - Signed by [name], MD on 10/01/2016     Adenocarcinoma of rectum (HCC)  09/01/2016 Initial Diagnosis   Rectal adenocarcinoma (HCC)   09/01/2016 Surgery   Hemorrhoidectomy 09/01/2016. Noted to have a mobile right lateral anal canal mass. Dr. Bernarda Ned.    09/01/2016 Pathology Results   Invasive adenocarcinoma, well-differentiated, spanning 1.3 cm. The tumor invaded into the anal sphincter muscle. Resection margins were negative. The pathologist notes the tumor would be best staged as pT2 given involvement of muscle.    09/06/2016 Imaging   CT scans chest/abdomen/pelvis 09/06/2016 showed no evidence of metastatic disease.    09/22/2016 Imaging   Pelvic MRI 09/22/2016 showed no definite residual anal tumor. There was no evidence of tumor involvement of the external sphincter or adjacent organs. There was no pelvic lymphadenopathy.   10/10/2016 - 11/22/2016 Radiation Therapy   Patient begun adjuvant radiation with Dr. Dewey   10/10/2016 - 11/22/2016 Chemotherapy   xeloda  1500mg , twice daily.   11/24/2017 Imaging   CT CAP W contrast 11/24/17  IMPRESSION: 1. Stable exam. No new or progressive interval findings to suggest metastatic disease. 2.  Aortic Atherosclerois (ICD10-170.0) 3.  Emphysema. (PRI89-G56.9)    NSCLC of left lung (HCC)  12/25/2023 Cancer Staging   Staging form: Lung, AJCC V9 - Clinical stage from 12/25/2023: Stage IVA (cT3, cN0, cM1a) - Signed by [name], MD on 12/31/2023   12/31/2023 Initial Diagnosis   NSCLC of left lung (HCC)   01/18/2024 -  Chemotherapy   Patient is on Treatment Plan : LUNG Carboplatin  (5) + Pemetrexed  (500) +  Pembrolizumab  (200) D1 q21d Induction x 4 cycles / Maintenance Pemetrexed  (500) + Pembrolizumab  (200) D1 q21d        Discussed the use of AI scribe software for clinical note transcription with the patient, who gave verbal consent to proceed.  History of Present Illness JAUN GALLUZZO is a 68 year old male with metastatic non-small cell lung cancer who presents for oncology follow-up after recent COVID-19 infection and chemotherapy-induced anemia.  He is receiving chemotherapy and immunotherapy for metastatic non-small cell lung cancer, with cycle 3 scheduled for tomorrow. He has mild chemotherapy-induced anemia with hemoglobin 10.2, without significant fatigue or transfusion needs. Energy and appetite have recovered, though appetite took about one week to return after the last cycle.  He developed flat, non-pruritic skin color changes on his back, chest, and inner thighs that are not bothersome and have been monitored with serial photographs, without need for intervention.  After his last chemotherapy on December 4, he developed COVID-19 confirmed in the emergency department, with fever up to 100.56F. He did not receive antiviral therapy and has fully recovered, with resolution of fever, cough, sore throat, and myalgias. Blood cultures at that time showed a single peripheral positive for Staphylococcus aureus with negative port cultures, and he has had no further infectious symptoms.  He uses topical lidocaine  (emla  cream) as needed and takes amlodipine. He and his family are reviewing insurance options related to ongoing treatment costs.     All other systems were reviewed with the patient and are negative.  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   Hypertension    Iron deficiency    Iron overload    iron deposits on liver   Legally blind in left eye, as defined in [place]     PFO (patent foramen ovale)    per TEE done 05-11-2009    Rectal adenocarcinoma (HCC) 09/28/2016   Traumatic glaucoma, 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 at [facility] in [place]   08-18-2001   Wears glasses     SURGICAL HISTORY: Past Surgical History:  Procedure Laterality Date   ARTHROSCOPIC REPAIR ACL Right 1998   BIOPSY  03/16/2021   Procedure: BIOPSY;  Surgeon: [name], MD;  Location: [facility];  Service: Endoscopy;;   ESOPHAGOGASTRODUODENOSCOPY (EGD) WITH PROPOFOL  N/A 03/16/2021   Procedure: ESOPHAGOGASTRODUODENOSCOPY (EGD) WITH PROPOFOL ;  Surgeon: [name], MD;  Location: [facility];  Service: Endoscopy;  Laterality: N/A;   EXPLORATION AND REPAIR LEFT EYE INJURY  08-18-2001   Coin   ruptured globe- repair corneal laceration, reposition of prolapsed uveal tissue   HEMORRHOID SURGERY N/A 09/01/2016   Procedure: HEMORRHOIDECTOMY;  Surgeon: [name], MD;  Location: [facility];  Service: General;  Laterality: N/A;   IR IMAGING GUIDED PORT INSERTION  01/16/2024   JOINT REPLACEMENT Right    knee   LAPAROTOMY N/A 12/04/2020   Procedure: EXPLORATORY LAPAROTOMY small bowel resection;  Surgeon: [name], MD;  Location: [facility] OR;  Service: General;  Laterality: N/A;   SUPERFICIAL KERATECTOMY Left 06-29-2011    [facility]    with EDTA scrub of left eye   TEE WITHOUT CARDIOVERSION  05-11-2009  dr vina gull   LVSEF 55-65%/  mild thickened AV without AI/  trace MR/ mixed fixed artherosclerosis plaqueing thoracic aorta/  no evidence thrombus/  by agitated saling and color doppler there was a PFO   VIDEO BRONCHOSCOPY WITH ENDOBRONCHIAL NAVIGATION Bilateral 12/25/2023    Procedure: VIDEO BRONCHOSCOPY WITH ENDOBRONCHIAL NAVIGATION;  Surgeon: Shelah Lamar RAMAN, MD;  Location: [facility];  Service: Pulmonary;  Laterality: Bilateral;    I have reviewed the social history and family history with the patient and they are unchanged from previous note.  ALLERGIES:  has no known allergies.  MEDICATIONS:  Current Outpatient Medications  Medication Sig Dispense Refill   acetaminophen  (TYLENOL ) 500 MG tablet Take 2 tablets (1,000 mg total) by mouth every 6 (six) hours as needed. 30 tablet 0   dexamethasone  (DECADRON ) 4 MG tablet Take 1 tablet 2 times daily starting day before pemetrexed . Then take 2 tablets daily x 3 days starting day after carboplatin . Take with food. 30 tablet 1   folic acid  (FOLVITE ) 1 MG tablet Take 1 tablet (1 mg total) by mouth daily. Start 7 days before pemetrexed  chemotherapy. Continue until 21 days after pemetrexed  completed. 100 tablet 3   hydrochlorothiazide  (HYDRODIURIL ) 25 MG tablet Take 1 tablet (25 mg total) by mouth daily for hypertension. 90 tablet 1   lidocaine -prilocaine  (EMLA ) cream Apply to affected area once 30 g 3   lisinopril  (ZESTRIL ) 10 MG tablet Take 1 tablet (10 mg total) by mouth daily for hypertension. 30 tablet 0   lisinopril  (ZESTRIL ) 10 MG tablet Take 1 tablet (10 mg  total) by mouth daily for hypertension.. 90 tablet 3   moxifloxacin  (VIGAMOX ) 0.5 % ophthalmic solution Place 1 drop into the left eye 4 (four) times daily for 10 days. 5 mL 5   Multiple Vitamin (MULTIVITAMIN WITH MINERALS) TABS tablet Take 1 tablet by mouth in the morning.     ondansetron  (ZOFRAN ) 8 MG tablet Take 1 tablet (8 mg total) by mouth every 8 (eight) hours as needed for nausea or vomiting. 10 tablet 0   ondansetron  (ZOFRAN ) 8 MG tablet Take 1 tablet (8 mg total) by mouth every 8 (eight) hours as needed for nausea or vomiting. Start on the third day after carboplatin . 30 tablet 1   polyethylene glycol (MIRALAX / GLYCOLAX) 17 g packet Take 17 g by mouth  in the morning.     prochlorperazine  (COMPAZINE ) 10 MG tablet Take 1 tablet (10 mg total) by mouth every 6 (six) hours as needed for nausea or vomiting. 30 tablet 1   timolol  (TIMOPTIC ) 0.5 % ophthalmic solution Place 1 drop into the left eye daily. 10 mL 11   timolol  (TIMOPTIC ) 0.5 % ophthalmic solution Place 1 drop into the left eye every morning. 10 mL 11   timolol  (TIMOPTIC ) 0.5 % ophthalmic solution Administer 1 drop into left eye daily. 10 mL 11   No current facility-administered medications for this visit.    PHYSICAL EXAMINATION: ECOG PERFORMANCE STATUS: 1 - Symptomatic but completely ambulatory  Vitals:   02/27/24 0800 02/27/24 0859  BP: (!) 143/92 (!) 132/94  Pulse: 81 85  Resp: 16   Temp: 98.2 F (36.8 C)   SpO2: 96% 100%   Wt Readings from Last 3 Encounters:  02/27/24 128 lb 1.6 oz (58.1 kg)  02/08/24 128 lb 1.6 oz (58.1 kg)  01/23/24 124 lb 11.2 oz (56.6 kg)     GENERAL:alert, no distress and comfortable SKIN: skin color, texture, turgor are normal, (+) diffuse skin rash like hyperpigmentation in trunk  EYES: normal, Conjunctiva are pink and non-injected, sclera clear NECK: supple, thyroid  normal size, non-tender, without nodularity LYMPH:  no palpable lymphadenopathy in the cervical, axillary  LUNGS: clear to auscultation and percussion with normal breathing effort HEART: regular rate & rhythm and no murmurs and no lower extremity edema ABDOMEN:abdomen soft, non-tender and normal bowel sounds Musculoskeletal:no cyanosis of digits and no clubbing  NEURO: alert & oriented x 3 with fluent speech, no focal motor/sensory deficits  Physical Exam   LABORATORY DATA:  I have reviewed the data as listed    Latest Ref Rng & Units 02/27/2024    8:31 AM 02/11/2024    3:46 PM 02/08/2024    7:38 AM  CBC  WBC 4.0 - 10.5 K/uL 5.2  4.6  6.5   Hemoglobin 13.0 - 17.0 g/dL 89.7  88.6  89.2   Hematocrit 39.0 - 52.0 % 30.4  34.6  32.0   Platelets 150 - 400 K/uL 200  143  202          Latest Ref Rng & Units 02/27/2024    8:31 AM 02/11/2024    3:46 PM 02/08/2024    7:38 AM  CMP  Glucose 70 - 99 mg/dL 96  95  884   BUN 8 - 23 mg/dL 22  15  14    Creatinine 0.61 - 1.24 mg/dL 8.75  8.92  9.01   Sodium 135 - 145 mmol/L 136  132  137   Potassium 3.5 - 5.1 mmol/L 4.7  4.0  4.5   Chloride 98 -  111 mmol/L 104  98  105   CO2 22 - 32 mmol/L 23  24  24    Calcium 8.9 - 10.3 mg/dL 9.4  9.3  9.5   Total Protein 6.5 - 8.1 g/dL 7.3  6.7  7.0   Total Bilirubin 0.0 - 1.2 mg/dL 0.2  0.4  0.2   Alkaline Phos 38 - 126 U/L 101  90  101   AST 15 - 41 U/L 23  29  25    ALT 0 - 44 U/L 25  27  20        RADIOGRAPHIC STUDIES: I have personally reviewed the radiological images as listed and agreed with the findings in the report. No results found.    Orders Placed This Encounter  Procedures   CT Chest W Contrast    Standing Status:   Future    Expected Date:   03/15/2024    Expiration Date:   02/26/2025    If indicated for the ordered procedure, I authorize the administration of contrast media per Radiology protocol:   Yes    Does the patient have a contrast media/X-ray dye allergy?:   Yes    Preferred imaging location?:   [facility]   All questions were answered. The patient knows to call the clinic with any problems, questions or concerns. No barriers to learning was detected. The total time spent in the appointment was 25 minutes, including review of chart and various tests results, discussions about plan of care and coordination of care plan     Onita Mattock, MD 02/27/2024     "

## 2024-02-27 NOTE — Assessment & Plan Note (Signed)
 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  She is a light smoker. - CT 12/04/2013 scan showed 3.9 cm mass in the left upper lobe lung with numerous satellite nodules, and bilateral subcentimeter lung nodules.  -PET scan with hypermetabolic left upper lobe lung mass, no other distant metastasis and small lung nodules are not hypermetabolic on PET. -Patient underwent bronchoscopy and a biopsy on December 25, 2023, the biopsy of the left upper lung mass and right lower lobe lung nodule both showed adenocarcinoma, TTF-1 positive, CDX2 negative, supporting lung primary. -NGS [facility] showed no targetable mutation except ATM, likely germline mutation.

## 2024-02-28 ENCOUNTER — Inpatient Hospital Stay: Admitting: Hematology

## 2024-02-28 ENCOUNTER — Inpatient Hospital Stay

## 2024-02-28 VITALS — BP 123/88 | HR 79 | Temp 98.6°F | Resp 16

## 2024-02-28 DIAGNOSIS — C3492 Malignant neoplasm of unspecified part of left bronchus or lung: Secondary | ICD-10-CM

## 2024-02-28 DIAGNOSIS — Z5111 Encounter for antineoplastic chemotherapy: Secondary | ICD-10-CM | POA: Diagnosis not present

## 2024-02-28 LAB — T4: T4, Total: 8.5 ug/dL (ref 4.5–12.0)

## 2024-02-28 MED ORDER — DEXAMETHASONE SOD PHOSPHATE PF 10 MG/ML IJ SOLN
10.0000 mg | Freq: Once | INTRAMUSCULAR | Status: AC
  Administered 2024-02-28: 10 mg via INTRAVENOUS

## 2024-02-28 MED ORDER — SODIUM CHLORIDE 0.9 % IV SOLN
150.0000 mg | Freq: Once | INTRAVENOUS | Status: AC
  Administered 2024-02-28: 150 mg via INTRAVENOUS
  Filled 2024-02-28: qty 150

## 2024-02-28 MED ORDER — SODIUM CHLORIDE 0.9 % IV SOLN: INTRAVENOUS | Status: DC

## 2024-02-28 MED ORDER — CYANOCOBALAMIN 1000 MCG/ML IJ SOLN
1000.0000 ug | Freq: Once | INTRAMUSCULAR | Status: AC
  Administered 2024-02-28: 1000 ug via INTRAMUSCULAR
  Filled 2024-02-28: qty 1

## 2024-02-28 MED ORDER — SODIUM CHLORIDE 0.9 % IV SOLN
200.0000 mg | Freq: Once | INTRAVENOUS | Status: AC
  Administered 2024-02-28: 200 mg via INTRAVENOUS
  Filled 2024-02-28: qty 200

## 2024-02-28 MED ORDER — SODIUM CHLORIDE 0.9 % IV SOLN
500.0000 mg/m2 | Freq: Once | INTRAVENOUS | Status: AC
  Administered 2024-02-28: 800 mg via INTRAVENOUS
  Filled 2024-02-28: qty 20

## 2024-02-28 MED ORDER — SODIUM CHLORIDE 0.9 % IV SOLN
349.0000 mg | Freq: Once | INTRAVENOUS | Status: AC
  Administered 2024-02-28: 350 mg via INTRAVENOUS
  Filled 2024-02-28: qty 35

## 2024-02-28 MED ORDER — PALONOSETRON HCL INJECTION 0.25 MG/5ML
0.2500 mg | Freq: Once | INTRAVENOUS | Status: AC
  Administered 2024-02-28: 0.25 mg via INTRAVENOUS
  Filled 2024-02-28: qty 5

## 2024-02-28 NOTE — Progress Notes (Signed)
 [facility] CSW Progress Note  Copywriter, Advertising met with patient as requested.  Interventions: Emailed copy of AD to ACP department to be scanned into chart. Emailed Animator document to [employer] as requested by patient / spouse.       Follow Up Plan:  Patient will contact Primary CSW if needed.    Lizbeth Sprague, LCSW Clinical Social Worker [facility]

## 2024-02-28 NOTE — Patient Instructions (Signed)
 [facility] - A DEPT OF [facility]  Discharge Instructions: Thank you for choosing [facility] to provide your oncology and hematology care.   If you have a lab appointment with the Cancer Center, please go directly to the Cancer Center and check in at the registration area.   Wear comfortable clothing and clothing appropriate for easy access to any Portacath or PICC line.   We strive to give you quality time with your provider. You may need to reschedule your appointment if you arrive late (15 or more minutes).  Arriving late affects you and other patients whose appointments are after yours.  Also, if you miss three or more appointments without notifying the office, you may be dismissed from the clinic at the provider's discretion.      For prescription refill requests, have your pharmacy contact our office and allow 72 hours for refills to be completed.    Today you received the following chemotherapy and/or immunotherapy agents: Pembrolizumab  (Keytruda ), Pemetrexed  disodium (Alimta ), & Carboplatin  (Paraplatin )    To help prevent nausea and vomiting after your treatment, we encourage you to take your nausea medication as directed.  BELOW ARE SYMPTOMS THAT SHOULD BE REPORTED IMMEDIATELY: *FEVER GREATER THAN 100.4 F (38 C) OR HIGHER *CHILLS OR SWEATING *NAUSEA AND VOMITING THAT IS NOT CONTROLLED WITH YOUR NAUSEA MEDICATION *UNUSUAL SHORTNESS OF BREATH *UNUSUAL BRUISING OR BLEEDING *URINARY PROBLEMS (pain or burning when urinating, or frequent urination) *BOWEL PROBLEMS (unusual diarrhea, constipation, pain near the anus) TENDERNESS IN MOUTH AND THROAT WITH OR WITHOUT PRESENCE OF ULCERS (sore throat, sores in mouth, or a toothache) UNUSUAL RASH, SWELLING OR PAIN  UNUSUAL VAGINAL DISCHARGE OR ITCHING   Items with * indicate a potential emergency and should be followed up as soon as possible or go to the Emergency Department if any problems  should occur.  Please show the CHEMOTHERAPY ALERT CARD or IMMUNOTHERAPY ALERT CARD at check-in to the Emergency Department and triage nurse.  Should you have questions after your visit or need to cancel or reschedule your appointment, please contact [facility] - A DEPT [facility]  Dept: [phone]  and follow the prompts.  Office hours are 8:00 a.m. to 4:30 p.m. Monday - Friday. Please note that voicemails left after 4:00 p.m. may not be returned until the following business day.  We are closed weekends and major holidays. You have access to a nurse at all times for urgent questions. Please call the main number to the clinic Dept: [phone] and follow the prompts.   For any non-urgent questions, you may also contact your provider using MyChart. We now offer e-Visits for anyone 44 and older to request care online for non-urgent symptoms. For details visit [email or web address].   Also download the MyChart app! Go to the app store, search MyChart, open the app, select [place], and log in with your MyChart username and password.

## 2024-03-15 ENCOUNTER — Ambulatory Visit (HOSPITAL_COMMUNITY)
Admission: RE | Admit: 2024-03-15 | Discharge: 2024-03-15 | Disposition: A | Source: Ambulatory Visit | Attending: Hematology

## 2024-03-15 DIAGNOSIS — C3492 Malignant neoplasm of unspecified part of left bronchus or lung: Secondary | ICD-10-CM | POA: Insufficient documentation

## 2024-03-15 MED ORDER — HEPARIN SOD (PORK) LOCK FLUSH 100 UNIT/ML IV SOLN
INTRAVENOUS | Status: AC
  Filled 2024-03-15: qty 5

## 2024-03-15 MED ORDER — IOHEXOL 300 MG/ML  SOLN
75.0000 mL | Freq: Once | INTRAMUSCULAR | Status: AC | PRN
  Administered 2024-03-15: 75 mL via INTRAVENOUS

## 2024-03-20 ENCOUNTER — Other Ambulatory Visit (HOSPITAL_COMMUNITY): Payer: Self-pay

## 2024-03-21 ENCOUNTER — Other Ambulatory Visit (HOSPITAL_COMMUNITY): Payer: Self-pay

## 2024-03-21 ENCOUNTER — Inpatient Hospital Stay

## 2024-03-21 ENCOUNTER — Inpatient Hospital Stay: Attending: Hematology

## 2024-03-21 ENCOUNTER — Inpatient Hospital Stay: Admitting: Hematology

## 2024-03-21 VITALS — BP 130/62 | HR 78 | Temp 98.1°F | Resp 17 | Wt 136.0 lb

## 2024-03-21 VITALS — BP 131/87 | HR 78 | Resp 16

## 2024-03-21 DIAGNOSIS — C3492 Malignant neoplasm of unspecified part of left bronchus or lung: Secondary | ICD-10-CM

## 2024-03-21 DIAGNOSIS — Z85048 Personal history of other malignant neoplasm of rectum, rectosigmoid junction, and anus: Secondary | ICD-10-CM | POA: Diagnosis not present

## 2024-03-21 DIAGNOSIS — Z86718 Personal history of other venous thrombosis and embolism: Secondary | ICD-10-CM | POA: Diagnosis not present

## 2024-03-21 DIAGNOSIS — C7801 Secondary malignant neoplasm of right lung: Secondary | ICD-10-CM | POA: Diagnosis not present

## 2024-03-21 DIAGNOSIS — C3412 Malignant neoplasm of upper lobe, left bronchus or lung: Secondary | ICD-10-CM | POA: Diagnosis present

## 2024-03-21 DIAGNOSIS — C7802 Secondary malignant neoplasm of left lung: Secondary | ICD-10-CM | POA: Diagnosis not present

## 2024-03-21 DIAGNOSIS — T451X5A Adverse effect of antineoplastic and immunosuppressive drugs, initial encounter: Secondary | ICD-10-CM | POA: Insufficient documentation

## 2024-03-21 DIAGNOSIS — C20 Malignant neoplasm of rectum: Secondary | ICD-10-CM

## 2024-03-21 DIAGNOSIS — L299 Pruritus, unspecified: Secondary | ICD-10-CM | POA: Insufficient documentation

## 2024-03-21 DIAGNOSIS — Z5111 Encounter for antineoplastic chemotherapy: Secondary | ICD-10-CM | POA: Insufficient documentation

## 2024-03-21 DIAGNOSIS — D6481 Anemia due to antineoplastic chemotherapy: Secondary | ICD-10-CM | POA: Insufficient documentation

## 2024-03-21 DIAGNOSIS — Z79899 Other long term (current) drug therapy: Secondary | ICD-10-CM | POA: Diagnosis not present

## 2024-03-21 LAB — CBC WITH DIFFERENTIAL (CANCER CENTER ONLY)
Abs Immature Granulocytes: 0.02 K/uL (ref 0.00–0.07)
Basophils Absolute: 0 K/uL (ref 0.0–0.1)
Basophils Relative: 0 %
Eosinophils Absolute: 0.1 K/uL (ref 0.0–0.5)
Eosinophils Relative: 2 %
HCT: 27.3 % — ABNORMAL LOW (ref 39.0–52.0)
Hemoglobin: 9.2 g/dL — ABNORMAL LOW (ref 13.0–17.0)
Immature Granulocytes: 0 %
Lymphocytes Relative: 26 %
Lymphs Abs: 1.3 K/uL (ref 0.7–4.0)
MCH: 29.3 pg (ref 26.0–34.0)
MCHC: 33.7 g/dL (ref 30.0–36.0)
MCV: 86.9 fL (ref 80.0–100.0)
Monocytes Absolute: 0.8 K/uL (ref 0.1–1.0)
Monocytes Relative: 17 %
Neutro Abs: 2.6 K/uL (ref 1.7–7.7)
Neutrophils Relative %: 55 %
Platelet Count: 189 K/uL (ref 150–400)
RBC: 3.14 MIL/uL — ABNORMAL LOW (ref 4.22–5.81)
RDW: 18.2 % — ABNORMAL HIGH (ref 11.5–15.5)
WBC Count: 4.8 K/uL (ref 4.0–10.5)
nRBC: 0 % (ref 0.0–0.2)

## 2024-03-21 LAB — IRON AND IRON BINDING CAPACITY (CC-WL,HP ONLY)
Iron: 84 ug/dL (ref 45–182)
Saturation Ratios: 29 % (ref 17.9–39.5)
TIBC: 290 ug/dL (ref 250–450)
UIBC: 206 ug/dL

## 2024-03-21 LAB — CMP (CANCER CENTER ONLY)
ALT: 24 U/L (ref 0–44)
AST: 26 U/L (ref 15–41)
Albumin: 4 g/dL (ref 3.5–5.0)
Alkaline Phosphatase: 99 U/L (ref 38–126)
Anion gap: 10 (ref 5–15)
BUN: 19 mg/dL (ref 8–23)
CO2: 23 mmol/L (ref 22–32)
Calcium: 9.1 mg/dL (ref 8.9–10.3)
Chloride: 106 mmol/L (ref 98–111)
Creatinine: 1.09 mg/dL (ref 0.61–1.24)
GFR, Estimated: >60 mL/min
Glucose, Bld: 105 mg/dL — ABNORMAL HIGH (ref 70–99)
Potassium: 4.4 mmol/L (ref 3.5–5.1)
Sodium: 139 mmol/L (ref 135–145)
Total Bilirubin: <0.2 mg/dL (ref 0.0–1.2)
Total Protein: 6.6 g/dL (ref 6.5–8.1)

## 2024-03-21 LAB — FERRITIN: Ferritin: 1812 ng/mL — ABNORMAL HIGH (ref 24–336)

## 2024-03-21 MED ORDER — SODIUM CHLORIDE 0.9 % IV SOLN: INTRAVENOUS | Status: DC

## 2024-03-21 MED ORDER — PALONOSETRON HCL INJECTION 0.25 MG/5ML
0.2500 mg | Freq: Once | INTRAVENOUS | Status: AC
  Administered 2024-03-21: 0.25 mg via INTRAVENOUS
  Filled 2024-03-21: qty 5

## 2024-03-21 MED ORDER — SODIUM CHLORIDE 0.9 % IV SOLN
500.0000 mg/m2 | Freq: Once | INTRAVENOUS | Status: AC
  Administered 2024-03-21: 800 mg via INTRAVENOUS
  Filled 2024-03-21: qty 20

## 2024-03-21 MED ORDER — DEXAMETHASONE SOD PHOSPHATE PF 10 MG/ML IJ SOLN
10.0000 mg | Freq: Once | INTRAMUSCULAR | Status: AC
  Administered 2024-03-21: 10 mg via INTRAVENOUS
  Filled 2024-03-21: qty 1

## 2024-03-21 MED ORDER — SODIUM CHLORIDE 0.9 % IV SOLN
380.0000 mg | Freq: Once | INTRAVENOUS | Status: AC
  Administered 2024-03-21: 380 mg via INTRAVENOUS
  Filled 2024-03-21: qty 38

## 2024-03-21 MED ORDER — SODIUM CHLORIDE 0.9 % IV SOLN
200.0000 mg | Freq: Once | INTRAVENOUS | Status: AC
  Administered 2024-03-21: 200 mg via INTRAVENOUS
  Filled 2024-03-21: qty 200

## 2024-03-21 MED ORDER — SODIUM CHLORIDE 0.9 % IV SOLN
150.0000 mg | Freq: Once | INTRAVENOUS | Status: AC
  Administered 2024-03-21: 150 mg via INTRAVENOUS
  Filled 2024-03-21: qty 150

## 2024-03-21 NOTE — Patient Instructions (Signed)
 [facility] - A DEPT OF [facility]  Discharge Instructions: Thank you for choosing [facility] to provide your oncology and hematology care.   If you have a lab appointment with the Cancer Center, please go directly to the Cancer Center and check in at the registration area.   Wear comfortable clothing and clothing appropriate for easy access to any Portacath or PICC line.   We strive to give you quality time with your provider. You may need to reschedule your appointment if you arrive late (15 or more minutes).  Arriving late affects you and other patients whose appointments are after yours.  Also, if you miss three or more appointments without notifying the office, you may be dismissed from the clinic at the provider's discretion.      For prescription refill requests, have your pharmacy contact our office and allow 72 hours for refills to be completed.    Today you received the following chemotherapy and/or immunotherapy agents: Pembrolizumab  (Keytruda ), Pemetrexed  disodium (Alimta ), & Carboplatin  (Paraplatin )    To help prevent nausea and vomiting after your treatment, we encourage you to take your nausea medication as directed.  BELOW ARE SYMPTOMS THAT SHOULD BE REPORTED IMMEDIATELY: *FEVER GREATER THAN 100.4 F (38 C) OR HIGHER *CHILLS OR SWEATING *NAUSEA AND VOMITING THAT IS NOT CONTROLLED WITH YOUR NAUSEA MEDICATION *UNUSUAL SHORTNESS OF BREATH *UNUSUAL BRUISING OR BLEEDING *URINARY PROBLEMS (pain or burning when urinating, or frequent urination) *BOWEL PROBLEMS (unusual diarrhea, constipation, pain near the anus) TENDERNESS IN MOUTH AND THROAT WITH OR WITHOUT PRESENCE OF ULCERS (sore throat, sores in mouth, or a toothache) UNUSUAL RASH, SWELLING OR PAIN  UNUSUAL VAGINAL DISCHARGE OR ITCHING   Items with * indicate a potential emergency and should be followed up as soon as possible or go to the Emergency Department if any problems  should occur.  Please show the CHEMOTHERAPY ALERT CARD or IMMUNOTHERAPY ALERT CARD at check-in to the Emergency Department and triage nurse.  Should you have questions after your visit or need to cancel or reschedule your appointment, please contact [facility] - A DEPT [facility]  Dept: [phone]  and follow the prompts.  Office hours are 8:00 a.m. to 4:30 p.m. Monday - Friday. Please note that voicemails left after 4:00 p.m. may not be returned until the following business day.  We are closed weekends and major holidays. You have access to a nurse at all times for urgent questions. Please call the main number to the clinic Dept: [phone] and follow the prompts.   For any non-urgent questions, you may also contact your provider using MyChart. We now offer e-Visits for anyone 44 and older to request care online for non-urgent symptoms. For details visit [email or web address].   Also download the MyChart app! Go to the app store, search MyChart, open the app, select [place], and log in with your MyChart username and password.

## 2024-03-21 NOTE — Progress Notes (Signed)
 " [facility]   [phone] Fax:([phone]) [phone]   Clinic Follow up Note   Patient Care Team: [name], MD as PCP - General (Family Medicine) [name], MD as Consulting Physician (General Surgery) [name], MD as Consulting Physician (Radiation Oncology) [name], MD as Consulting Physician (Hematology) [name], MD as Consulting Physician (Gastroenterology) [name], MD as Consulting Physician (General Surgery)  Date of Service:  03/21/2024  CHIEF COMPLAINT: f/u of NSCLC   CURRENT THERAPY:  First-line chemotherapy carboplatin , pemetrexed  and pembrolizumab  every 3 weeks  Oncology History   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  She is a light smoker. - CT 12/04/2013 scan showed 3.9 cm mass in the left upper lobe lung with numerous satellite nodules, and bilateral subcentimeter lung nodules.  -PET scan with hypermetabolic left upper lobe lung mass, no other distant metastasis and small lung nodules are not hypermetabolic on PET. -Patient underwent bronchoscopy and a biopsy on December 25, 2023, the biopsy of the left upper lung mass and right lower lobe lung nodule both showed adenocarcinoma, TTF-1 positive, CDX2 negative, supporting lung primary. -NGS [facility] showed no targetable mutation except ATM, likely germline mutation.   Assessment & Plan Metastatic non-small cell lung cancer of the left lung Receiving chemotherapy and immunotherapy. Recent imaging demonstrated slight increase in tumor size with stable other lung nodules, indicating suboptimal response. Differential includes tumor progression, interval growth prior to therapy (last scan was done one month before C1 chemo), or immunotherapy-related inflammation. He remains clinically well with ECOG Performance Status 0 and is tolerating  therapy. - Administered cycle 4 chemotherapy as scheduled. - Plan to switch to single-agent pemetrexed  (Alimta ) with continued pembrolizumab  (Keytruda ) for next cycle. - Ordered repeat imaging in two months after three additional cycles to assess response. - Planned whole body PET scan at next imaging interval. - Continue 21-day chemotherapy cycle schedule. - If progression is observed on next scan, will change chemotherapy regimen. - Scheduled follow-up in three weeks.  Chemotherapy-induced anemia Mild anemia (hemoglobin 9.2 g/dL) likely secondary to ongoing chemotherapy. He is asymptomatic except for possible mild fatigue and does not require transfusion. - Monitored hemoglobin and blood counts. - Educated that single-agent chemotherapy in the next cycle may have less hematologic toxicity.  Drug-induced skin eruption Worsening rash with mild pruritus, predominantly on the back and abdomen, without evidence of severe cutaneous reaction. - Advised use of emollient for symptomatic relief. - Instructed to monitor for worsening symptoms or pain.  Plan - I personally reviewed her restaging CT scan images with patient and his wife, discussed the findings and my interpretation - Will continue current chemotherapy plan, and repeat PET scan in 2 months for close follow-up - He will return in 3 weeks to start maintenance Alimta  and Keytruda  - Genetic referral to rule out a germline mutation of ATM   SUMMARY OF ONCOLOGIC HISTORY: Oncology History Overview Note  Cancer Staging Rectal adenocarcinoma [facility]) Staging form: Colon and Rectum, AJCC 8th Edition - Pathologic stage from 09/01/2016: Stage I (pT2, pN0, cM0) - Signed by [name], MD on 10/01/2016     Adenocarcinoma of rectum (HCC)  09/01/2016 Initial Diagnosis   Rectal adenocarcinoma (HCC)   09/01/2016 Surgery   Hemorrhoidectomy 09/01/2016. Noted to have a mobile right lateral anal canal mass. Dr. Hila Debby.    09/01/2016 Pathology  Results   Invasive  adenocarcinoma, well-differentiated, spanning 1.3 cm. The tumor invaded into the anal sphincter muscle. Resection margins were negative. The pathologist notes the tumor would be best staged as pT2 given involvement of muscle.    09/06/2016 Imaging   CT scans chest/abdomen/pelvis 09/06/2016 showed no evidence of metastatic disease.    09/22/2016 Imaging   Pelvic MRI 09/22/2016 showed no definite residual anal tumor. There was no evidence of tumor involvement of the external sphincter or adjacent organs. There was no pelvic lymphadenopathy.   10/10/2016 - 11/22/2016 Radiation Therapy   Patient begun adjuvant radiation with Dr. Dewey   10/10/2016 - 11/22/2016 Chemotherapy   xeloda  1500mg , twice daily.   11/24/2017 Imaging   CT CAP W contrast 11/24/17  IMPRESSION: 1. Stable exam. No new or progressive interval findings to suggest metastatic disease. 2.  Aortic Atherosclerois (ICD10-170.0) 3.  Emphysema. (PRI89-G56.9)    NSCLC of left lung (HCC)  12/25/2023 Cancer Staging   Staging form: Lung, AJCC V9 - Clinical stage from 12/25/2023: Stage IVA (cT3, cN0, cM1a) - Signed by [name], MD on 12/31/2023   12/31/2023 Initial Diagnosis   NSCLC of left lung (HCC)   01/18/2024 -  Chemotherapy   Patient is on Treatment Plan : LUNG Carboplatin  (5) + Pemetrexed  (500) + Pembrolizumab  (200) D1 q21d Induction x 4 cycles / Maintenance Pemetrexed  (500) + Pembrolizumab  (200) D1 q21d        Discussed the use of AI scribe software for clinical note transcription with the patient, who gave verbal consent to proceed.  History of Present Illness Juan David is a 69 year old male with metastatic non-small cell lung cancer who presents for follow-up to assess treatment response and management of chemotherapy-induced anemia and drug-induced skin eruption.  He started current systemic therapy on 01/18/2024 and is now in cycle 4. He has returned to work and misses only treatment days. He  denies new systemic symptoms and reports adequate energy.  He notes worsening, diffuse rash on the abdomen and back with mild pruritus and no pain. The eruption has progressed since the last visit.  He denies new joint pain, stiffness, or sensory changes. He has stable chronic left knee pain not attributed to treatment and is able to maintain usual activities aside from treatment days.     All other systems were reviewed with the patient and are negative.  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   Traumatic glaucoma, 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 at [facility] in [place]   08-18-2001   Wears glasses     SURGICAL HISTORY: Past Surgical History:  Procedure Laterality Date   ARTHROSCOPIC REPAIR ACL Right 1998   BIOPSY  03/16/2021   Procedure: BIOPSY;  Surgeon: Avram Pitts  E, MD;  Location: [facility];  Service: Endoscopy;;   ESOPHAGOGASTRODUODENOSCOPY (EGD) WITH PROPOFOL  N/A 03/16/2021   Procedure: ESOPHAGOGASTRODUODENOSCOPY (EGD) WITH PROPOFOL ;  Surgeon: [name], MD;  Location: [facility];  Service: Endoscopy;  Laterality: N/A;   EXPLORATION AND REPAIR LEFT EYE INJURY  08-18-2001   [place]   ruptured globe- repair corneal laceration, reposition of prolapsed uveal tissue   HEMORRHOID SURGERY N/A 09/01/2016   Procedure: HEMORRHOIDECTOMY;  Surgeon: [name], MD;  Location: [facility];  Service: General;  Laterality: N/A;   IR IMAGING GUIDED PORT INSERTION  01/16/2024   JOINT REPLACEMENT Right    knee   LAPAROTOMY N/A 12/04/2020   Procedure: EXPLORATORY LAPAROTOMY small bowel resection;  Surgeon: [name], MD;  Location: [facility] OR;  Service: General;  Laterality: N/A;   SUPERFICIAL KERATECTOMY Left 06-29-2011    [facility]    with EDTA scrub of left eye   TEE WITHOUT CARDIOVERSION  05-11-2009  dr vina gull   LVSEF 55-65%/  mild thickened AV without AI/  trace MR/ mixed fixed artherosclerosis plaqueing thoracic aorta/  no evidence thrombus/  by agitated saling and color doppler there was a PFO   VIDEO BRONCHOSCOPY WITH ENDOBRONCHIAL NAVIGATION Bilateral 12/25/2023   Procedure: VIDEO BRONCHOSCOPY WITH ENDOBRONCHIAL NAVIGATION;  Surgeon: Shelah Lamar RAMAN, MD;  Location: [facility] END[facility]COPY;  Service: Pulmonary;  Laterality: Bilateral;    I have reviewed the social history and family history with the patient and they are unchanged from previous note.  ALLERGIES:  has no known allergies.  MEDICATIONS:  Current Outpatient Medications  Medication Sig Dispense Refill   acetaminophen  (TYLENOL ) 500 MG tablet Take 2 tablets (1,000 mg total) by mouth every 6 (six) hours as needed. 30 tablet 0   dexamethasone  (DECADRON ) 4 MG tablet Take 1 tablet 2 times daily starting day before pemetrexed . Then take 2 tablets daily x 3 days starting day after carboplatin . Take with food. 30 tablet 1   folic acid  (FOLVITE ) 1 MG tablet Take 1 tablet (1 mg total) by mouth daily. Start 7 days before pemetrexed  chemotherapy. Continue until 21 days after pemetrexed  completed. 100 tablet 3   hydrochlorothiazide  (HYDRODIURIL ) 25 MG tablet Take 1 tablet (25 mg total) by mouth daily for hypertension. 90 tablet 1   lidocaine -prilocaine  (EMLA ) cream Apply to affected area once 30 g 3   lisinopril  (ZESTRIL ) 10 MG tablet Take 1 tablet (10 mg total) by mouth daily for  hypertension. 30 tablet 0   lisinopril  (ZESTRIL ) 10 MG tablet Take 1 tablet (10 mg total) by mouth daily for hypertension.. 90 tablet 3   moxifloxacin  (VIGAMOX ) 0.5 % ophthalmic solution Place 1 drop into the left eye 4 (four) times daily for 10 days. 5 mL 5   Multiple Vitamin (MULTIVITAMIN WITH MINERALS) TABS tablet Take 1 tablet by mouth in the morning.     ondansetron  (ZOFRAN ) 8 MG tablet Take 1 tablet (8 mg total) by mouth every 8 (eight) hours as needed for nausea or vomiting. 10 tablet 0   ondansetron  (ZOFRAN ) 8 MG tablet Take 1 tablet (8 mg total) by mouth every 8 (eight) hours as needed for nausea or vomiting. Start on the third day after carboplatin . 30 tablet 1   polyethylene glycol (MIRALAX / GLYCOLAX) 17 g packet Take 17 g by mouth in the morning.     prochlorperazine  (COMPAZINE ) 10 MG tablet Take 1 tablet (10 mg total) by mouth every 6 (six) hours as needed for nausea or vomiting. 30  tablet 1   timolol  (TIMOPTIC ) 0.5 % ophthalmic solution Place 1 drop into the left eye daily. 10 mL 11   timolol  (TIMOPTIC ) 0.5 % ophthalmic solution Place 1 drop into the left eye every morning. 10 mL 11   timolol  (TIMOPTIC ) 0.5 % ophthalmic solution Administer 1 drop into left eye daily. 10 mL 11   No current facility-administered medications for this visit.    PHYSICAL EXAMINATION: ECOG PERFORMANCE STATUS: 0 - Asymptomatic  Vitals:   03/21/24 0830  BP: 130/62  Pulse: 78  Resp: 17  Temp: 98.1 F (36.7 C)  SpO2: 99%   Wt Readings from Last 3 Encounters:  03/21/24 136 lb (61.7 kg)  02/27/24 128 lb 1.6 oz (58.1 kg)  02/08/24 128 lb 1.6 oz (58.1 kg)     GENERAL:alert, no distress and comfortable SKIN: skin color, texture, turgor are normal, diffuse macular skin hyperpigmentation rashes in trunk EYES: normal, Conjunctiva are pink and non-injected, sclera clear NECK: supple, thyroid  normal size, non-tender, without nodularity LYMPH:  no palpable lymphadenopathy in the cervical, axillary   LUNGS: clear to auscultation and percussion with normal breathing effort HEART: regular rate & rhythm and no murmurs and no lower extremity edema ABDOMEN:abdomen soft, non-tender and normal bowel sounds Musculoskeletal:no cyanosis of digits and no clubbing  NEURO: alert & oriented x 3 with fluent speech, no focal motor/sensory deficits  Physical Exam SKIN: Rash present on abdomen and back.  LABORATORY DATA:  I have reviewed the data as listed    Latest Ref Rng & Units 03/21/2024    8:10 AM 02/27/2024    8:31 AM 02/11/2024    3:46 PM  CBC  WBC 4.0 - 10.5 K/uL 4.8  5.2  4.6   Hemoglobin 13.0 - 17.0 g/dL 9.2  89.7  88.6   Hematocrit 39.0 - 52.0 % 27.3  30.4  34.6   Platelets 150 - 400 K/uL 189  200  143         Latest Ref Rng & Units 02/27/2024    8:31 AM 02/11/2024    3:46 PM 02/08/2024    7:38 AM  CMP  Glucose 70 - 99 mg/dL 96  95  884   BUN 8 - 23 mg/dL 22  15  14    Creatinine 0.61 - 1.24 mg/dL 8.75  8.92  9.01   Sodium 135 - 145 mmol/L 136  132  137   Potassium 3.5 - 5.1 mmol/L 4.7  4.0  4.5   Chloride 98 - 111 mmol/L 104  98  105   CO2 22 - 32 mmol/L 23  24  24    Calcium 8.9 - 10.3 mg/dL 9.4  9.3  9.5   Total Protein 6.5 - 8.1 g/dL 7.3  6.7  7.0   Total Bilirubin 0.0 - 1.2 mg/dL 0.2  0.4  0.2   Alkaline Phos 38 - 126 U/L 101  90  101   AST 15 - 41 U/L 23  29  25    ALT 0 - 44 U/L 25  27  20        RADIOGRAPHIC STUDIES: I have personally reviewed the radiological images as listed and agreed with the findings in the report. No results found.    Orders Placed This Encounter  Procedures   Ambulatory referral to Genetics    Referral Priority:   Routine    Referral Type:   Consultation    Referral Reason:   Specialty Services Required    Number of Visits Requested:  1   All questions were answered. The patient knows to call the clinic with any problems, questions or concerns. No barriers to learning was detected. The total time spent in the appointment was 30  minutes, including review of chart and various tests results, discussions about plan of care and coordination of care plan     Onita Mattock, MD 03/21/2024     "

## 2024-03-21 NOTE — Assessment & Plan Note (Signed)
 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  She is a light smoker. - CT 12/04/2013 scan showed 3.9 cm mass in the left upper lobe lung with numerous satellite nodules, and bilateral subcentimeter lung nodules.  -PET scan with hypermetabolic left upper lobe lung mass, no other distant metastasis and small lung nodules are not hypermetabolic on PET. -Patient underwent bronchoscopy and a biopsy on December 25, 2023, the biopsy of the left upper lung mass and right lower lobe lung nodule both showed adenocarcinoma, TTF-1 positive, CDX2 negative, supporting lung primary. -NGS [facility] showed no targetable mutation except ATM, likely germline mutation.

## 2024-03-22 ENCOUNTER — Other Ambulatory Visit: Payer: Self-pay

## 2024-03-26 ENCOUNTER — Other Ambulatory Visit: Payer: Self-pay

## 2024-03-26 ENCOUNTER — Telehealth: Payer: Self-pay

## 2024-03-26 DIAGNOSIS — C3490 Malignant neoplasm of unspecified part of unspecified bronchus or lung: Secondary | ICD-10-CM

## 2024-03-26 DIAGNOSIS — C20 Malignant neoplasm of rectum: Secondary | ICD-10-CM

## 2024-03-26 DIAGNOSIS — C3492 Malignant neoplasm of unspecified part of left bronchus or lung: Secondary | ICD-10-CM

## 2024-03-26 NOTE — Telephone Encounter (Signed)
 Pt's spouse called stating that they would like a 2nd opinion from Dr Arvella Pons regarding this pt.  Stated Dr. Demetra office will make the referral to Dr. Remo office and they will give them a call to get them scheduled.

## 2024-03-27 ENCOUNTER — Telehealth: Payer: Self-pay | Admitting: *Deleted

## 2024-03-27 ENCOUNTER — Other Ambulatory Visit: Payer: Self-pay

## 2024-03-27 NOTE — Telephone Encounter (Signed)
 Left message for return call to discuss 2nd opinion appt

## 2024-03-27 NOTE — Telephone Encounter (Signed)
 Wife called to f/u on status of referral to Dr. Cloretta for 2nd opinion. Notified nurse navigator of call.

## 2024-04-05 ENCOUNTER — Other Ambulatory Visit (HOSPITAL_COMMUNITY): Payer: Self-pay

## 2024-04-08 ENCOUNTER — Other Ambulatory Visit (HOSPITAL_COMMUNITY): Payer: Self-pay

## 2024-04-10 ENCOUNTER — Inpatient Hospital Stay: Attending: Hematology | Admitting: Oncology

## 2024-04-10 ENCOUNTER — Inpatient Hospital Stay

## 2024-04-10 VITALS — BP 140/82 | HR 79 | Temp 98.7°F | Resp 18 | Ht 71.0 in | Wt 134.7 lb

## 2024-04-10 DIAGNOSIS — C3492 Malignant neoplasm of unspecified part of left bronchus or lung: Secondary | ICD-10-CM | POA: Diagnosis not present

## 2024-04-10 NOTE — Assessment & Plan Note (Signed)
 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  He is a light smoker. - CT 12/04/2013 scan showed 3.9 cm mass in the left upper lobe lung with numerous satellite nodules, and bilateral subcentimeter lung nodules.  -PET scan with hypermetabolic left upper lobe lung mass, no other distant metastasis and small lung nodules are not hypermetabolic on PET. -Patient underwent bronchoscopy and a biopsy on December 25, 2023, the biopsy of the left upper lung mass and right lower lobe lung nodule both showed adenocarcinoma, TTF-1 positive, CDX2 negative, supporting lung primary. -NGS [facility] showed no targetable mutation except ATM, likely germline mutation. -He started first line chemo carbo, pemetrexed , and pembro on 01/18/2024 -restaging CT 03/15/2024 (after C3 chemo) showed enlarging of his left lung cancer, he is clinically doing well, will continue two more cycles and restage  -chemo changed to maintenance pemetrexed  and pembro from cycle 5

## 2024-04-10 NOTE — Progress Notes (Signed)
 " [facility] New Patient Consult   Requesting MD: [name], Md [address] [place],  [place] [address]   Juan David 69 y.o.  December 28, 1955    Reason for Consult: Non-small cell lung cancer   HPI: Mr. Sudberry was found to have a left lung mass on a CT abdomen while undergoing evaluation for possible hemochromatosis.  An MRI abdomen on 11/07/2023 revealed iron deposition in the liver.  There was suspicion of new and enlarging pulmonary nodules.  A CT chest 11/29/2023 revealed a 3.9 cm left upper lobe nodule with numerous adjacent satellite nodules.  Additional left and right lung nodules were noted.  These were new compared to a CT from 2019.  He was referred to Dr. Lanny.  A PET scan 12/15/2023 revealed a hypermetabolic posterior left upper lobe mass concerning for a primary bronchogenic carcinoma.  Small bilateral pulmonary nodules are too small for PET resolution.  Focal mild for hypermetabolism was noted with a possible 1.6 cm soft tissue lesion.  Focal hypermetabolism was noted at the posterior right 10th rib corresponding to a nondisplaced fracture.  He underwent a navigation bronchoscopy by Dr. Shelah 12/24/2024 for biopsy of the left upper lobe mass and a right lower lobe nodule.  The left upper lobe FNA and brushing revealed adenocarcinoma consistent with a lung primary.  The right lower lobe nodule biopsy also revealed adenocarcinoma of lung primary.  He began treatment with pemetrexed /carboplatin /pembrolizumab  on 01/18/2024.  He has completed 4 cycles to date with the last cycle given 03/21/2024.  He reports tolerating the treatment well.  He developed a rash following cycle 1.  The rash has persisted.  The rash is nonpruritic.  A CT chest after cycle 3 revealed an increase in the posterior left upper lobe mass and 3 stable lower lobe pulmonary nodules.  Dr. Lanny recommended continuing the current treatment with a switch to pemetrexed /pembrolizumab  after cycle  4.  The plan is for repeat imaging after cycle 6.    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   Traumatic glaucoma, 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 at [facility] in [place]   08-18-2001   Wears glasses     Past Surgical History:  Procedure Laterality Date   ARTHROSCOPIC REPAIR ACL Right 1998   BIOPSY  03/16/2021   Procedure: BIOPSY;  Surgeon: [name], MD;  Location: [facility];  Service: Endoscopy;;   ESOPHAGOGASTRODUODENOSCOPY (EGD) WITH PROPOFOL  N/A 03/16/2021   Procedure: ESOPHAGOGASTRODUODENOSCOPY (EGD) WITH PROPOFOL ;  Surgeon: [name], MD;  Location: [facility];  Service: Endoscopy;  Laterality: N/A;   EXPLORATION AND REPAIR LEFT EYE INJURY  08-18-2001   [place]   ruptured globe- repair corneal laceration, reposition of  prolapsed uveal tissue   HEMORRHOID SURGERY N/A 09/01/2016   Procedure: HEMORRHOIDECTOMY;  Surgeon: [name], MD;  Location: [facility];  Service: General;  Laterality: N/A;   IR IMAGING GUIDED PORT INSERTION  01/16/2024   JOINT REPLACEMENT Right    knee   LAPAROTOMY N/A 12/04/2020   Procedure: EXPLORATORY LAPAROTOMY small bowel resection;  Surgeon: [name], MD;  Location: [facility] OR;  Service: General;  Laterality: N/A;   SUPERFICIAL KERATECTOMY Left 06-29-2011    [facility]    with EDTA scrub of left eye   TEE WITHOUT  CARDIOVERSION  05-11-2009  dr vina gull   LVSEF 55-65%/  mild thickened AV without AI/  trace MR/ mixed fixed artherosclerosis plaqueing thoracic aorta/  no evidence thrombus/  by agitated saling and color doppler there was a PFO   VIDEO BRONCHOSCOPY WITH ENDOBRONCHIAL NAVIGATION Bilateral 12/25/2023   Procedure: VIDEO BRONCHOSCOPY WITH ENDOBRONCHIAL NAVIGATION;  Surgeon: Shelah Lamar RAMAN, MD;  Location: [facility] ENDOSCOPY;  Service: Pulmonary;  Laterality: Bilateral;    Medications: Reviewed  Allergies: Allergies[1]  Family history: His father had lung cancer and prostate cancer.  A brother had prostate cancer.  Social History:   He lives with his wife and [place].  He works at [employer].  He quit smoking cigarettes in 2025.  He reports social alcohol use.  No transfusion history.  No risk factor for HIV or hepatitis.  ROS:   Positives include: Rash-began after cycle 1 chemotherapy, unchanged, nonpruritic.  Occasional night sweats, intermittent fecal incontinence after undergoing small bowel surgery, tender cystic lesion at the base of the scrotum that ruptured  A complete ROS was otherwise negative.  Physical Exam:  Blood pressure (!) 140/82, pulse 79, temperature 98.7 F (37.1 C), temperature source Temporal, resp. rate 18, height 5' 11 (1.803 m), weight 134 lb 11.2 oz (61.1 kg), SpO2 100%.  HEENT: Oropharynx without visible mass, 3-4 mm submental node?,  No mass in the submental region Lungs: Clear bilaterally Cardiac: Regular rate and rhythm Abdomen: No hepatosplenomegaly, nontender, no mass  Vascular: No leg edema Lymph nodes: No cervical, supraclavicular, or inguinal nodes.  Shotty bilateral axillary nodes Neurologic: Alert and oriented, motor exam appears intact in the upper and lower extremities bilaterally Skin: Flat hyperpigmented rash over the trunk and extremities, 2 cm mobile firm round cutaneous lesion at the base of the scrotum with a central  opening Musculoskeletal: No spine tenderness this   LAB:  CBC  Lab Results  Component Value Date   WBC 4.8 03/21/2024   HGB 9.2 (L) 03/21/2024   HCT 27.3 (L) 03/21/2024   MCV 86.9 03/21/2024   PLT 189 03/21/2024   NEUTROABS 2.6 03/21/2024        CMP  Lab Results  Component Value Date   NA 139 03/21/2024   K 4.4 03/21/2024   CL 106 03/21/2024   CO2 23 03/21/2024   GLUCOSE 105 (H) 03/21/2024   BUN 19 03/21/2024   CREATININE 1.09 03/21/2024   CALCIUM 9.1 03/21/2024   PROT 6.6 03/21/2024   ALBUMIN  4.0 03/21/2024   AST 26 03/21/2024   ALT 24 03/21/2024   ALKPHOS 99 03/21/2024   BILITOT <0.2 03/21/2024   GFRNONAA >60 03/21/2024   GFRAA >60 11/20/2017     Lab Results  Component Value Date   CEA1 2.35 01/18/2021    Imaging:  As per HPI, CT chest and pet images reviewed with Mr. Mckeithan and his wife    Assessment/Plan:   Non-small  cell lung cancer, stage IV, dominant left upper lung mass and bilateral pulmonary nodules, stage IVa (cT3, cN0, cM1a) 11/07/2023: MRI liver-suspected pulmonary nodules, new or increased prior to previous scan 03/08/2022, iron deposition in the liver, no suspicious liver lesion, iron deposition throughout the marrow space and spleen 11/29/2023 CT chest-posterior left upper lobe mass with adjacent satellite nodules, new bilateral lower lobe pulmonary nodules compared to CT 11/24/2017 12/15/2023 PET: Hypermetabolic posterior left lower lobe mass, small bilateral pulmonary nodules too small for PET resolution, ventral floor of the mouth hypermetabolism with a possible 1.6 cm hyperdense soft tissue lesion, focal hypermetabolism in the posterior right 10th rib corresponding to a nondisplaced fracture 12/15/2023 MRI brain: Negative for metastatic disease 12/25/2023 navigation bronchoscopy, biopsy of left upper lobe mass-adenocarcinoma, TTF-1 positive, CDX2 negative, biopsy of right lower lobe nodule-adenocarcinoma of lung primary, left upper lobe  biopsy: PD-L1 less than 1%, ATM mutation, 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   Rash-likely secondary to pembrolizumab  3.   Anemia 4.  Hypermetabolic floor of mouth lesion on PET 12/15/2023-significance? 5.  Rectal cancer, stage I (pT2 pN0 cM0 (, resection 09/01/2016, well-differentiated adenocarcinoma involving the anal sphincter muscle, negative resection margins 09/22/2016 pelvic MRI-no residual tumor, no pelvic lymphadenopathy 10/10/2016 - 11/22/2017 radiation and capecitabine  11/24/2017 CTs-no evidence of metastatic disease  6.   Elevated ferritin with MRI evidence of liver, bone, and spleen iron deposition, negative hereditary hemochromatosis panel 01/11/2022  7.  Hypertension 8.  Traumatic left eye injury   Disposition:   Mr. Sabine was recently diagnosed with stage IV non-small cell lung cancer.  He presented with a dominant left upper lung mass and multiple lung nodules.  Biopsy of the left upper lobe mass and a right lung nodule are consistent with a lung primary.  He has completed 4 cycles of systemic therapy to date.  A restaging chest CT after cycle 3 revealed stable lung nodules and enlargement of the left upper lobe mass.  I reviewed the CT and pet images with Mr. Demetriou and his wife. There was a significant interval between the baseline CT and PET and initiation of systemic therapy.  It is possible the left lung mass grew prior to initiation of therapy.  Is also possible the CT reflects pseudoprogression due to immunotherapy.  I agree with the treatment to date and plan to continue pemetrexed /pembrolizumab  with a restaging evaluation after 2 more cycles.  Mr. 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  Mr. Retherford indicated he would like to change his oncology care to the [facility].  He will complete a cycle of pemetrexed /pembrolizumab  at the [facility] tomorrow.  He plans to return to the [facility] in 3 weeks for cycle 6.  I agree with the excellent care provided by Dr. Lanny.  Arley Hof, MD  04/10/2024, 4:00 PM      [1] No Known Allergies  "

## 2024-04-11 ENCOUNTER — Other Ambulatory Visit: Payer: Self-pay

## 2024-04-11 ENCOUNTER — Inpatient Hospital Stay: Admitting: Hematology

## 2024-04-11 ENCOUNTER — Inpatient Hospital Stay

## 2024-04-11 VITALS — BP 139/94 | HR 81 | Temp 98.3°F | Ht 71.0 in | Wt 135.2 lb

## 2024-04-11 VITALS — BP 128/88 | HR 81 | Resp 16

## 2024-04-11 DIAGNOSIS — R7989 Other specified abnormal findings of blood chemistry: Secondary | ICD-10-CM

## 2024-04-11 DIAGNOSIS — C3492 Malignant neoplasm of unspecified part of left bronchus or lung: Secondary | ICD-10-CM

## 2024-04-11 DIAGNOSIS — C20 Malignant neoplasm of rectum: Secondary | ICD-10-CM

## 2024-04-11 LAB — CMP (CANCER CENTER ONLY)
ALT: 20 U/L (ref 0–44)
AST: 26 U/L (ref 15–41)
Albumin: 4.3 g/dL (ref 3.5–5.0)
Alkaline Phosphatase: 112 U/L (ref 38–126)
Anion gap: 11 (ref 5–15)
BUN: 15 mg/dL (ref 8–23)
CO2: 23 mmol/L (ref 22–32)
Calcium: 9.6 mg/dL (ref 8.9–10.3)
Chloride: 104 mmol/L (ref 98–111)
Creatinine: 1.13 mg/dL (ref 0.61–1.24)
GFR, Estimated: >60 mL/min
Glucose, Bld: 115 mg/dL — ABNORMAL HIGH (ref 70–99)
Potassium: 4.3 mmol/L (ref 3.5–5.1)
Sodium: 138 mmol/L (ref 135–145)
Total Bilirubin: 0.2 mg/dL (ref 0.0–1.2)
Total Protein: 7.2 g/dL (ref 6.5–8.1)

## 2024-04-11 LAB — CBC WITH DIFFERENTIAL (CANCER CENTER ONLY)
Abs Immature Granulocytes: 0.03 10*3/uL (ref 0.00–0.07)
Basophils Absolute: 0 10*3/uL (ref 0.0–0.1)
Basophils Relative: 0 %
Eosinophils Absolute: 0 10*3/uL (ref 0.0–0.5)
Eosinophils Relative: 0 %
HCT: 28.1 % — ABNORMAL LOW (ref 39.0–52.0)
Hemoglobin: 9.5 g/dL — ABNORMAL LOW (ref 13.0–17.0)
Immature Granulocytes: 1 %
Lymphocytes Relative: 14 %
Lymphs Abs: 0.9 10*3/uL (ref 0.7–4.0)
MCH: 30 pg (ref 26.0–34.0)
MCHC: 33.8 g/dL (ref 30.0–36.0)
MCV: 88.6 fL (ref 80.0–100.0)
Monocytes Absolute: 0.8 10*3/uL (ref 0.1–1.0)
Monocytes Relative: 11 %
Neutro Abs: 4.9 10*3/uL (ref 1.7–7.7)
Neutrophils Relative %: 74 %
Platelet Count: 196 10*3/uL (ref 150–400)
RBC: 3.17 MIL/uL — ABNORMAL LOW (ref 4.22–5.81)
RDW: 18 % — ABNORMAL HIGH (ref 11.5–15.5)
WBC Count: 6.6 10*3/uL (ref 4.0–10.5)
nRBC: 0 % (ref 0.0–0.2)

## 2024-04-11 LAB — IRON AND IRON BINDING CAPACITY (CC-WL,HP ONLY)
Iron: 77 ug/dL (ref 45–182)
Saturation Ratios: 24 % (ref 17.9–39.5)
TIBC: 316 ug/dL (ref 250–450)
UIBC: 240 ug/dL

## 2024-04-11 LAB — FERRITIN: Ferritin: 2718 ng/mL — ABNORMAL HIGH (ref 24–336)

## 2024-04-11 MED ORDER — SODIUM CHLORIDE 0.9 % IV SOLN
500.0000 mg/m2 | Freq: Once | INTRAVENOUS | Status: AC
  Administered 2024-04-11: 800 mg via INTRAVENOUS
  Filled 2024-04-11: qty 20

## 2024-04-11 MED ORDER — PROCHLORPERAZINE MALEATE 10 MG PO TABS
10.0000 mg | ORAL_TABLET | Freq: Once | ORAL | Status: AC
  Administered 2024-04-11: 10 mg via ORAL
  Filled 2024-04-11: qty 1

## 2024-04-11 MED ORDER — SODIUM CHLORIDE 0.9 % IV SOLN: INTRAVENOUS | Status: DC

## 2024-04-11 MED ORDER — SODIUM CHLORIDE 0.9 % IV SOLN
200.0000 mg | Freq: Once | INTRAVENOUS | Status: AC
  Administered 2024-04-11: 200 mg via INTRAVENOUS
  Filled 2024-04-11: qty 200

## 2024-04-11 NOTE — Patient Instructions (Signed)
 [facility] - A DEPT OF [facility]  Discharge Instructions: Thank you for choosing [facility] to provide your oncology and hematology care.   If you have a lab appointment with the Cancer Center, please go directly to the Cancer Center and check in at the registration area.   Wear comfortable clothing and clothing appropriate for easy access to any Portacath or PICC line.   We strive to give you quality time with your provider. You may need to reschedule your appointment if you arrive late (15 or more minutes).  Arriving late affects you and other patients whose appointments are after yours.  Also, if you miss three or more appointments without notifying the office, you may be dismissed from the clinic at the provider's discretion.      For prescription refill requests, have your pharmacy contact our office and allow 72 hours for refills to be completed.    Today you received the following chemotherapy and/or immunotherapy agents: Pembrolizumab (Keytruda) and Pemetrexed (Alimta)      To help prevent nausea and vomiting after your treatment, we encourage you to take your nausea medication as directed.  BELOW ARE SYMPTOMS THAT SHOULD BE REPORTED IMMEDIATELY: *FEVER GREATER THAN 100.4 F (38 C) OR HIGHER *CHILLS OR SWEATING *NAUSEA AND VOMITING THAT IS NOT CONTROLLED WITH YOUR NAUSEA MEDICATION *UNUSUAL SHORTNESS OF BREATH *UNUSUAL BRUISING OR BLEEDING *URINARY PROBLEMS (pain or burning when urinating, or frequent urination) *BOWEL PROBLEMS (unusual diarrhea, constipation, pain near the anus) TENDERNESS IN MOUTH AND THROAT WITH OR WITHOUT PRESENCE OF ULCERS (sore throat, sores in mouth, or a toothache) UNUSUAL RASH, SWELLING OR PAIN  UNUSUAL VAGINAL DISCHARGE OR ITCHING   Items with * indicate a potential emergency and should be followed up as soon as possible or go to the Emergency Department if any problems should occur.  Please show the  CHEMOTHERAPY ALERT CARD or IMMUNOTHERAPY ALERT CARD at check-in to the Emergency Department and triage nurse.  Should you have questions after your visit or need to cancel or reschedule your appointment, please contact [facility] - A DEPT OF [facility]  Dept: [phone]  and follow the prompts.  Office hours are 8:00 a.m. to 4:30 p.m. Monday - Friday. Please note that voicemails left after 4:00 p.m. may not be returned until the following business day.  We are closed weekends and major holidays. You have access to a nurse at all times for urgent questions. Please call the main number to the clinic Dept: [phone] and follow the prompts.   For any non-urgent questions, you may also contact your provider using MyChart. We now offer e-Visits for anyone 12 and older to request care online for non-urgent symptoms. For details visit [email or web address].   Also download the MyChart app! Go to the app store, search "MyChart", open the app, select [facility], and log in with your MyChart username and password.

## 2024-04-11 NOTE — Progress Notes (Signed)
 " [facility]   [phone] [phone]   Clinic Follow up Note   Patient Care Team: [name], MD as PCP - General (Family Medicine) [name], MD as Consulting Physician (General Surgery) [name], MD as Consulting Physician (Radiation Oncology) [name], MD as Consulting Physician (Hematology) [name], MD as Consulting Physician (Gastroenterology) [name], MD as Consulting Physician (General Surgery)  Date of Service:  04/11/2024  CHIEF COMPLAINT: f/u of non-small cell lung cancer  CURRENT THERAPY:  First-line chemotherapy carboplatin , pemetrexed  and Keytruda   Oncology History   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  He is a light smoker. - CT 12/04/2013 scan showed 3.9 cm mass in the left upper lobe lung with numerous satellite nodules, and bilateral subcentimeter lung nodules.  -PET scan with hypermetabolic left upper lobe lung mass, no other distant metastasis and small lung nodules are not hypermetabolic on PET. -Patient underwent bronchoscopy and a biopsy on December 25, 2023, the biopsy of the left upper lung mass and right lower lobe lung nodule both showed adenocarcinoma, TTF-1 positive, CDX2 negative, supporting lung primary. -NGS Tempus showed no targetable mutation except ATM, likely germline mutation. -He started first line chemo carbo, pemetrexed , and pembro on 01/18/2024 -restaging CT 03/15/2024 (after C3 chemo) showed enlarging of his left lung cancer, he is clinically doing well, will continue two more cycles and restage  -chemo changed to maintenance pemetrexed  and pembro from cycle 5    Assessment & Plan Metastatic non-small cell lung cancer of the left lung On first-line chemotherapy, imaging demonstrated slight tumor progression with stable other nodules,  indicating suboptimal response vs pseudo progression from immunotherapy. He remains clinically stable, with no new symptoms, stable weight, and no port complications. Transition to maintenance therapy is planned to reduce hematologic toxicity and fatigue. Genetic counseling is ongoing for possible germline mutation.  - Administered maintenance/second-line therapy: single-agent pemetrexed  with continued pembrolizumab . - Ordered PET scan to assess disease activity; if not approved, change to CT chest, abdomen, and pelvis as alternative. - Scheduled follow-up with oncology after next scan to reassess treatment plan.  Chemotherapy-induced anemia Mild, well-managed chemotherapy-induced anemia (hemoglobin 9.5 g/dL, normal platelets). B12 injections administered every three cycles as supportive care. Anticipated reduced hematologic toxicity with maintenance regimen. - Administer B12 injection every three cycles.  Drug-induced skin eruption Persistent, mild drug-induced skin eruption, unchanged since last cycle.  Plan - He is clinically doing very well and tolerating treatment well, asymptomatic. - Lab reviewed, adequate for treatment, will proceed for cycle maintenance pemetrexed  and Keytruda  today - Restaging PET scan in 2 to 3 weeks - He will transfer his care to Dr. Cloretta at the [facility] after today's treatment.   SUMMARY OF ONCOLOGIC HISTORY: Oncology History Overview Note  Cancer Staging Rectal adenocarcinoma [facility]) Staging form: Colon and Rectum, AJCC 8th Edition - Pathologic stage from 09/01/2016: Stage I (pT2, pN0, cM0) - Signed by [name], MD on 10/01/2016     Adenocarcinoma of rectum (HCC)  09/01/2016 Initial Diagnosis   Rectal adenocarcinoma (HCC)   09/01/2016 Surgery   Hemorrhoidectomy 09/01/2016. Noted to have a mobile right lateral anal canal mass. Dr. Hila Debby.    09/01/2016 Pathology Results   Invasive adenocarcinoma, well-differentiated, spanning 1.3 cm.  The tumor invaded into the anal sphincter muscle. Resection margins were negative. The pathologist notes the tumor would be best  staged as pT2 given involvement of muscle.    09/06/2016 Imaging   CT scans chest/abdomen/pelvis 09/06/2016 showed no evidence of metastatic disease.    09/22/2016 Imaging   Pelvic MRI 09/22/2016 showed no definite residual anal tumor. There was no evidence of tumor involvement of the external sphincter or adjacent organs. There was no pelvic lymphadenopathy.   10/10/2016 - 11/22/2016 Radiation Therapy   Patient begun adjuvant radiation with Dr. Dewey   10/10/2016 - 11/22/2016 Chemotherapy   xeloda  1500mg , twice daily.   11/24/2017 Imaging   CT CAP W contrast 11/24/17  IMPRESSION: 1. Stable exam. No new or progressive interval findings to suggest metastatic disease. 2.  Aortic Atherosclerois (ICD10-170.0) 3.  Emphysema. (PRI89-G56.9)    NSCLC of left lung (HCC)  12/25/2023 Cancer Staging   Staging form: Lung, AJCC V9 - Clinical stage from 12/25/2023: Stage IVA (cT3, cN0, cM1a) - Signed by [name], MD on 12/31/2023   12/31/2023 Initial Diagnosis   NSCLC of left lung (HCC)   01/18/2024 -  Chemotherapy   Patient is on Treatment Plan : LUNG Carboplatin  (5) + Pemetrexed  (500) + Pembrolizumab  (200) D1 q21d Induction x 4 cycles / Maintenance Pemetrexed  (500) + Pembrolizumab  (200) D1 q21d        Discussed the use of AI scribe software for clinical note transcription with the patient, who gave verbal consent to proceed.  History of Present Illness Juan David is a 69 year old male with metastatic non-small cell lung cancer who presents for follow-up after completion of combination chemotherapy.  Recent imaging showed a slight increase in primary tumor size, possibly immunotherapy related, with other pulmonary nodules stable.  He has a stable, non-pruritic drug-induced rash without discomfort. He denies oral mucosal lesions, thrush, lymphadenopathy, pain, new  symptoms, weight loss, or fatigue. Weight is stable at 135 lb.  Chemotherapy-induced anemia is monitored. Latest hemoglobin is 9.5 g/dL with stable platelets and no transfusions. He receives B12 every three cycles, most recently after his CT at cycle three. Thyroid  function and ferritin are normal.  His port is functioning without issues. He is scheduled for genetic counseling for a tumor mutation with possible germline origin.  Mar 21, 2024: Follow-up for metastatic NSCLC; cycle 4 of carboplatin , pemetrexed , and pembrolizumab  administered. Imaging showed slight increase in tumor size with stable other lung nodules, indicating suboptimal response. Mild chemotherapy-induced anemia (hemoglobin 9.2 g/dL) and worsening drug-induced skin eruption managed symptomatically. Plan to switch to single-agent pemetrexed  with continued pembrolizumab  for next cycle; repeat imaging ordered in two months. ECOG Performance Status 0, clinically well.     All other systems were reviewed with the patient and are negative.  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   Traumatic glaucoma, left eye followed  by dr monita earnie amis at [facility] in [place]   08-18-2001   Wears glasses     SURGICAL HISTORY: Past Surgical History:  Procedure Laterality Date   ARTHROSCOPIC REPAIR ACL Right 1998   BIOPSY  03/16/2021    Procedure: BIOPSY;  Surgeon: [name], MD;  Location: [facility];  Service: Endoscopy;;   ESOPHAGOGASTRODUODENOSCOPY (EGD) WITH PROPOFOL  N/A 03/16/2021   Procedure: ESOPHAGOGASTRODUODENOSCOPY (EGD) WITH PROPOFOL ;  Surgeon: [name], MD;  Location: [facility];  Service: Endoscopy;  Laterality: N/A;   EXPLORATION AND REPAIR LEFT EYE INJURY  08-18-2001   [facility]   ruptured globe- repair corneal laceration, reposition of prolapsed uveal tissue   HEMORRHOID SURGERY N/A 09/01/2016   Procedure: HEMORRHOIDECTOMY;  Surgeon: [name], MD;  Location: [facility];  Service: General;  Laterality: N/A;   IR IMAGING GUIDED PORT INSERTION  01/16/2024   JOINT REPLACEMENT Right    knee   LAPAROTOMY N/A 12/04/2020   Procedure: EXPLORATORY LAPAROTOMY small bowel resection;  Surgeon: [name], MD;  Location: [facility] OR;  Service: General;  Laterality: N/A;   SUPERFICIAL KERATECTOMY Left 06-29-2011    [facility]    with EDTA scrub of left eye   TEE WITHOUT CARDIOVERSION  05-11-2009  dr vina gull   LVSEF 55-65%/  mild thickened AV without AI/  trace MR/ mixed fixed artherosclerosis plaqueing thoracic aorta/  no evidence thrombus/  by agitated saling and color doppler there was a PFO   VIDEO BRONCHOSCOPY WITH ENDOBRONCHIAL NAVIGATION Bilateral 12/25/2023   Procedure: VIDEO BRONCHOSCOPY WITH ENDOBRONCHIAL NAVIGATION;  Surgeon: Shelah Lamar RAMAN, MD;  Location: [facility];  Service: Pulmonary;  Laterality: Bilateral;    I have reviewed the social history and family history with the patient and they are unchanged from previous note.  ALLERGIES:  has no known allergies.  MEDICATIONS:  Current Outpatient Medications  Medication Sig Dispense Refill   acetaminophen  (TYLENOL ) 500 MG tablet Take 2 tablets (1,000 mg total) by mouth every 6 (six) hours as needed. 30 tablet 0   dexamethasone  (DECADRON ) 4 MG tablet Take 1 tablet 2 times daily starting day before pemetrexed . Then take 2  tablets daily x 3 days starting day after carboplatin . Take with food. 30 tablet 1   folic acid  (FOLVITE ) 1 MG tablet Take 1 tablet (1 mg total) by mouth daily. Start 7 days before pemetrexed  chemotherapy. Continue until 21 days after pemetrexed  completed. 100 tablet 3   lidocaine -prilocaine  (EMLA ) cream Apply to affected area once 30 g 3   lisinopril  (ZESTRIL ) 10 MG tablet Take 1 tablet (10 mg total) by mouth daily for hypertension.. 90 tablet 3   Multiple Vitamin (MULTIVITAMIN WITH MINERALS) TABS tablet Take 1 tablet by mouth in the morning.     ondansetron  (ZOFRAN ) 8 MG tablet Take 1 tablet (8 mg total) by mouth every 8 (eight) hours as needed for nausea or vomiting. Start on the third day after carboplatin . 30 tablet 1   polyethylene glycol (MIRALAX / GLYCOLAX) 17 g packet Take 17 g by mouth in the morning.     prochlorperazine  (COMPAZINE ) 10 MG tablet Take 1 tablet (10 mg total) by mouth every 6 (six) hours as needed for nausea or vomiting. 30 tablet 1   timolol  (TIMOPTIC ) 0.5 % ophthalmic solution Administer 1 drop into left eye daily. 10 mL 11   No current facility-administered medications for this visit.    PHYSICAL EXAMINATION: ECOG PERFORMANCE STATUS: 0 - Asymptomatic  Vitals:  04/11/24 0831 04/11/24 0832  BP: 138/82 (!) 139/94  Pulse: 80 81  Temp: 98.3 F (36.8 C)   SpO2: 99% 100%   Wt Readings from Last 3 Encounters:  04/11/24 135 lb 3.2 oz (61.3 kg)  04/10/24 134 lb 11.2 oz (61.1 kg)  03/21/24 136 lb (61.7 kg)     GENERAL:alert, no distress and comfortable EYES: normal, Conjunctiva are pink and non-injected, sclera clear NECK: supple, thyroid  normal size, non-tender, without nodularity Musculoskeletal:no cyanosis of digits and no clubbing  NEURO: alert & oriented x 3 with fluent speech, no focal motor/sensory deficits  Physical Exam MEASUREMENTS: Weight- 135.  LABORATORY DATA:  I have reviewed the data as listed    Latest Ref Rng & Units 04/11/2024    8:11 AM  03/21/2024    8:10 AM 02/27/2024    8:31 AM  CBC  WBC 4.0 - 10.5 K/uL 6.6  4.8  5.2   Hemoglobin 13.0 - 17.0 g/dL 9.5  9.2  89.7   Hematocrit 39.0 - 52.0 % 28.1  27.3  30.4   Platelets 150 - 400 K/uL 196  189  200         Latest Ref Rng & Units 04/11/2024    8:11 AM 03/21/2024    8:10 AM 02/27/2024    8:31 AM  CMP  Glucose 70 - 99 mg/dL 884  894  96   BUN 8 - 23 mg/dL 15  19  22    Creatinine 0.61 - 1.24 mg/dL 8.86  8.90  8.75   Sodium 135 - 145 mmol/L 138  139  136   Potassium 3.5 - 5.1 mmol/L 4.3  4.4  4.7   Chloride 98 - 111 mmol/L 104  106  104   CO2 22 - 32 mmol/L 23  23  23    Calcium 8.9 - 10.3 mg/dL 9.6  9.1  9.4   Total Protein 6.5 - 8.1 g/dL 7.2  6.6  7.3   Total Bilirubin 0.0 - 1.2 mg/dL 0.2  <9.7  0.2   Alkaline Phos 38 - 126 U/L 112  99  101   AST 15 - 41 U/L 26  26  23    ALT 0 - 44 U/L 20  24  25        RADIOGRAPHIC STUDIES: I have personally reviewed the radiological images as listed and agreed with the findings in the report. No results found.    Orders Placed This Encounter  Procedures   NM PET Image Restage (PS) Skull Base to Thigh (F-18 FDG)    Standing Status:   Future    Expected Date:   04/25/2024    Expiration Date:   04/11/2025    If indicated for the ordered procedure, I authorize the administration of a radiopharmaceutical per Radiology protocol:   Yes    Preferred imaging location?:   [facility]   All questions were answered. The patient knows to call the clinic with any problems, questions or concerns. No barriers to learning was detected. The total time spent in the appointment was 25 minutes, including review of chart and various tests results, discussions about plan of care and coordination of care plan     Onita Mattock, MD 04/11/2024     "

## 2024-04-26 ENCOUNTER — Encounter (HOSPITAL_COMMUNITY)

## 2024-05-01 ENCOUNTER — Inpatient Hospital Stay: Admitting: Genetic Counselor

## 2024-05-01 ENCOUNTER — Inpatient Hospital Stay

## 2024-05-02 ENCOUNTER — Inpatient Hospital Stay

## 2024-05-02 ENCOUNTER — Inpatient Hospital Stay: Admitting: Nurse Practitioner
